# Patient Record
Sex: Male | Born: 1955
Health system: Southern US, Community
[De-identification: ages and names within clinical notes are randomized; demographics above are authoritative.]

## PROBLEM LIST (undated history)

## (undated) DIAGNOSIS — K269 Duodenal ulcer, unspecified as acute or chronic, without hemorrhage or perforation: Secondary | ICD-10-CM

## (undated) DIAGNOSIS — M1990 Unspecified osteoarthritis, unspecified site: Secondary | ICD-10-CM

## (undated) DIAGNOSIS — I10 Essential (primary) hypertension: Secondary | ICD-10-CM

## (undated) DIAGNOSIS — C189 Malignant neoplasm of colon, unspecified: Secondary | ICD-10-CM

## (undated) DIAGNOSIS — J449 Chronic obstructive pulmonary disease, unspecified: Secondary | ICD-10-CM

## (undated) HISTORY — DX: Duodenal ulcer, unspecified as acute or chronic, without hemorrhage or perforation: K26.9

## (undated) HISTORY — PX: HERNIA REPAIR: SHX51

## (undated) HISTORY — DX: Malignant neoplasm of colon, unspecified: C18.9

---

## 1998-04-19 ENCOUNTER — Ambulatory Visit (HOSPITAL_BASED_OUTPATIENT_CLINIC_OR_DEPARTMENT_OTHER): Admission: RE | Admit: 1998-04-19 | Discharge: 1998-04-19 | Payer: Self-pay | Admitting: General Surgery

## 2004-02-18 ENCOUNTER — Emergency Department (HOSPITAL_COMMUNITY): Admission: EM | Admit: 2004-02-18 | Discharge: 2004-02-18 | Payer: Self-pay | Admitting: *Deleted

## 2004-02-18 IMAGING — CR DG ANKLE COMPLETE 3+V*L*
2 series · 2 of 2 positions shown · non-contrast
Comparison: none

CLINICAL DATA: 48-year-old male; twisting ankle injury; lateral pain
LEFT ANKLE THREE VIEWS:
There is diffuse ankle soft tissue swelling and a suspected joint effusion on the lateral view.  On the frontal and oblique views, there is a vertical lucency through the left distal tibia extending into the articular surface most consistent with a nondisplaced fracture, likely involving the interarticular joint surface.  Alignment is anatomic.  Peripheral atherosclerosis is noted.  There is an irregular nondisplaced lucency through the left distal fibula shaft above the malleolus concerning for a second nondisplaced fracture.

[view not recorded (1 of 2)]
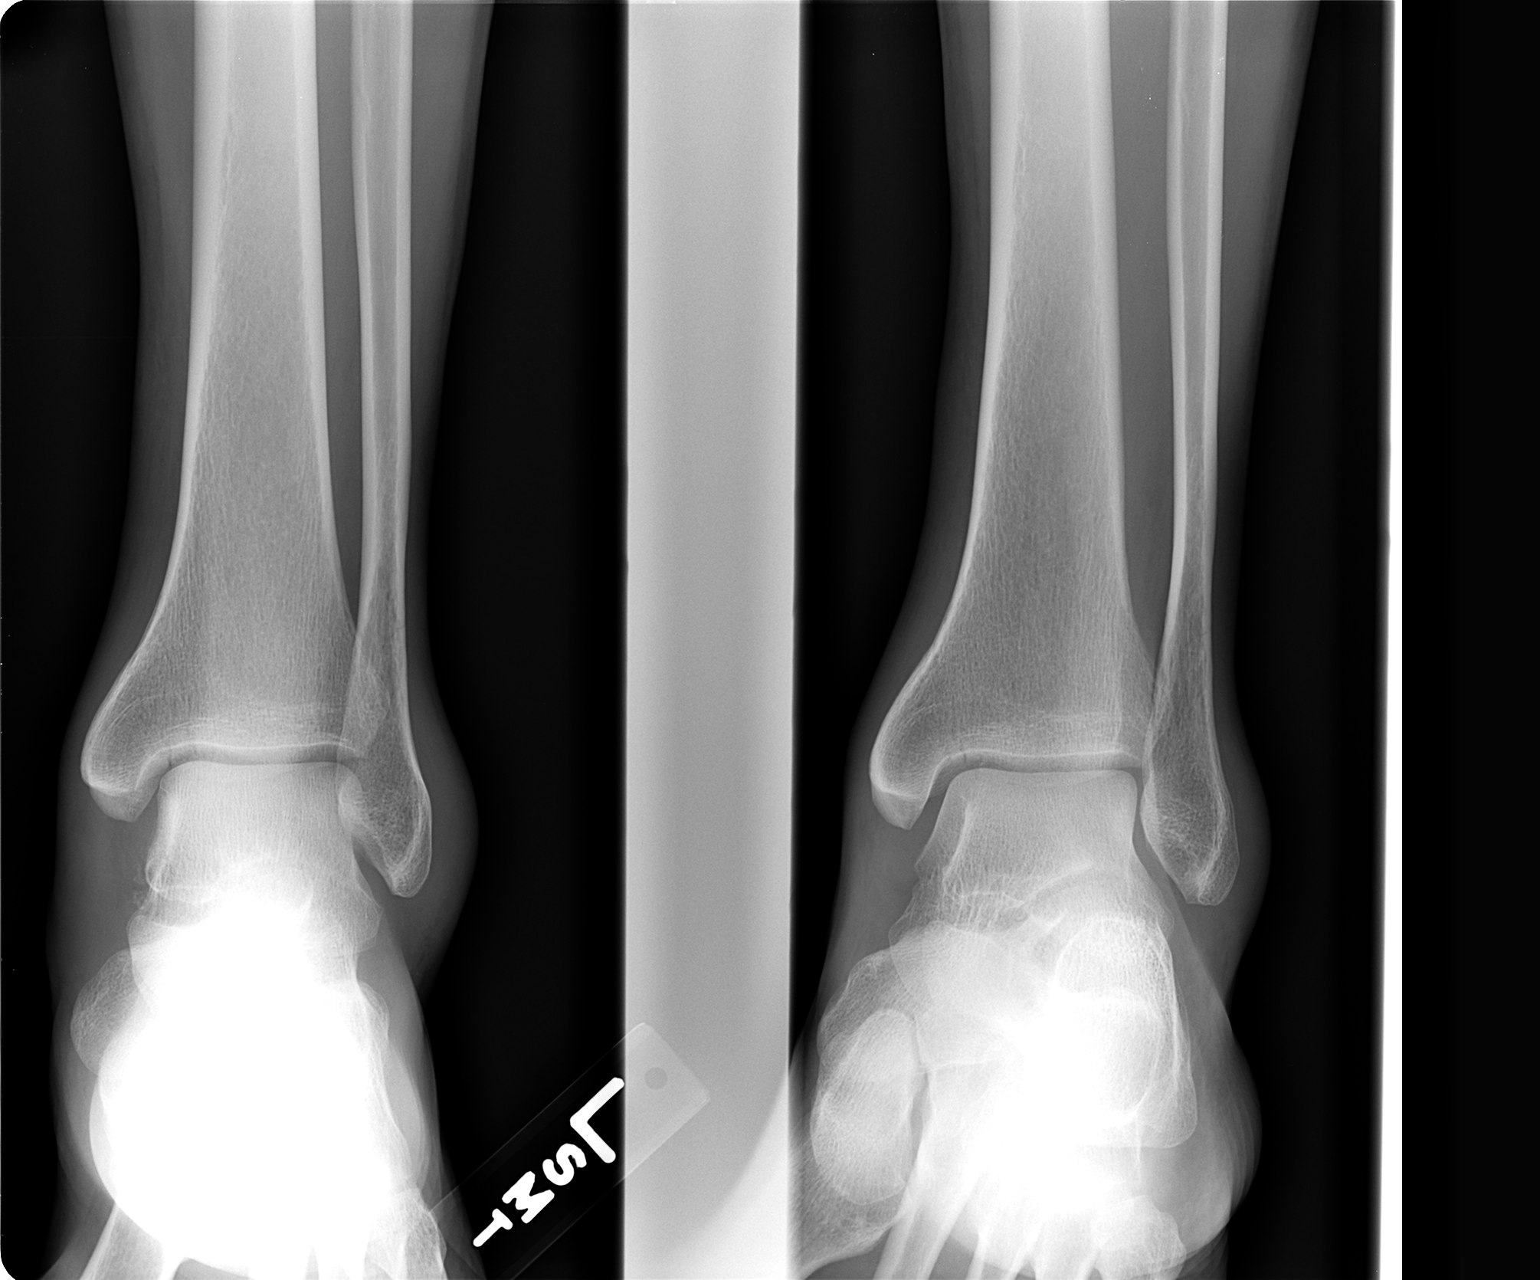

[view not recorded (2 of 2)]
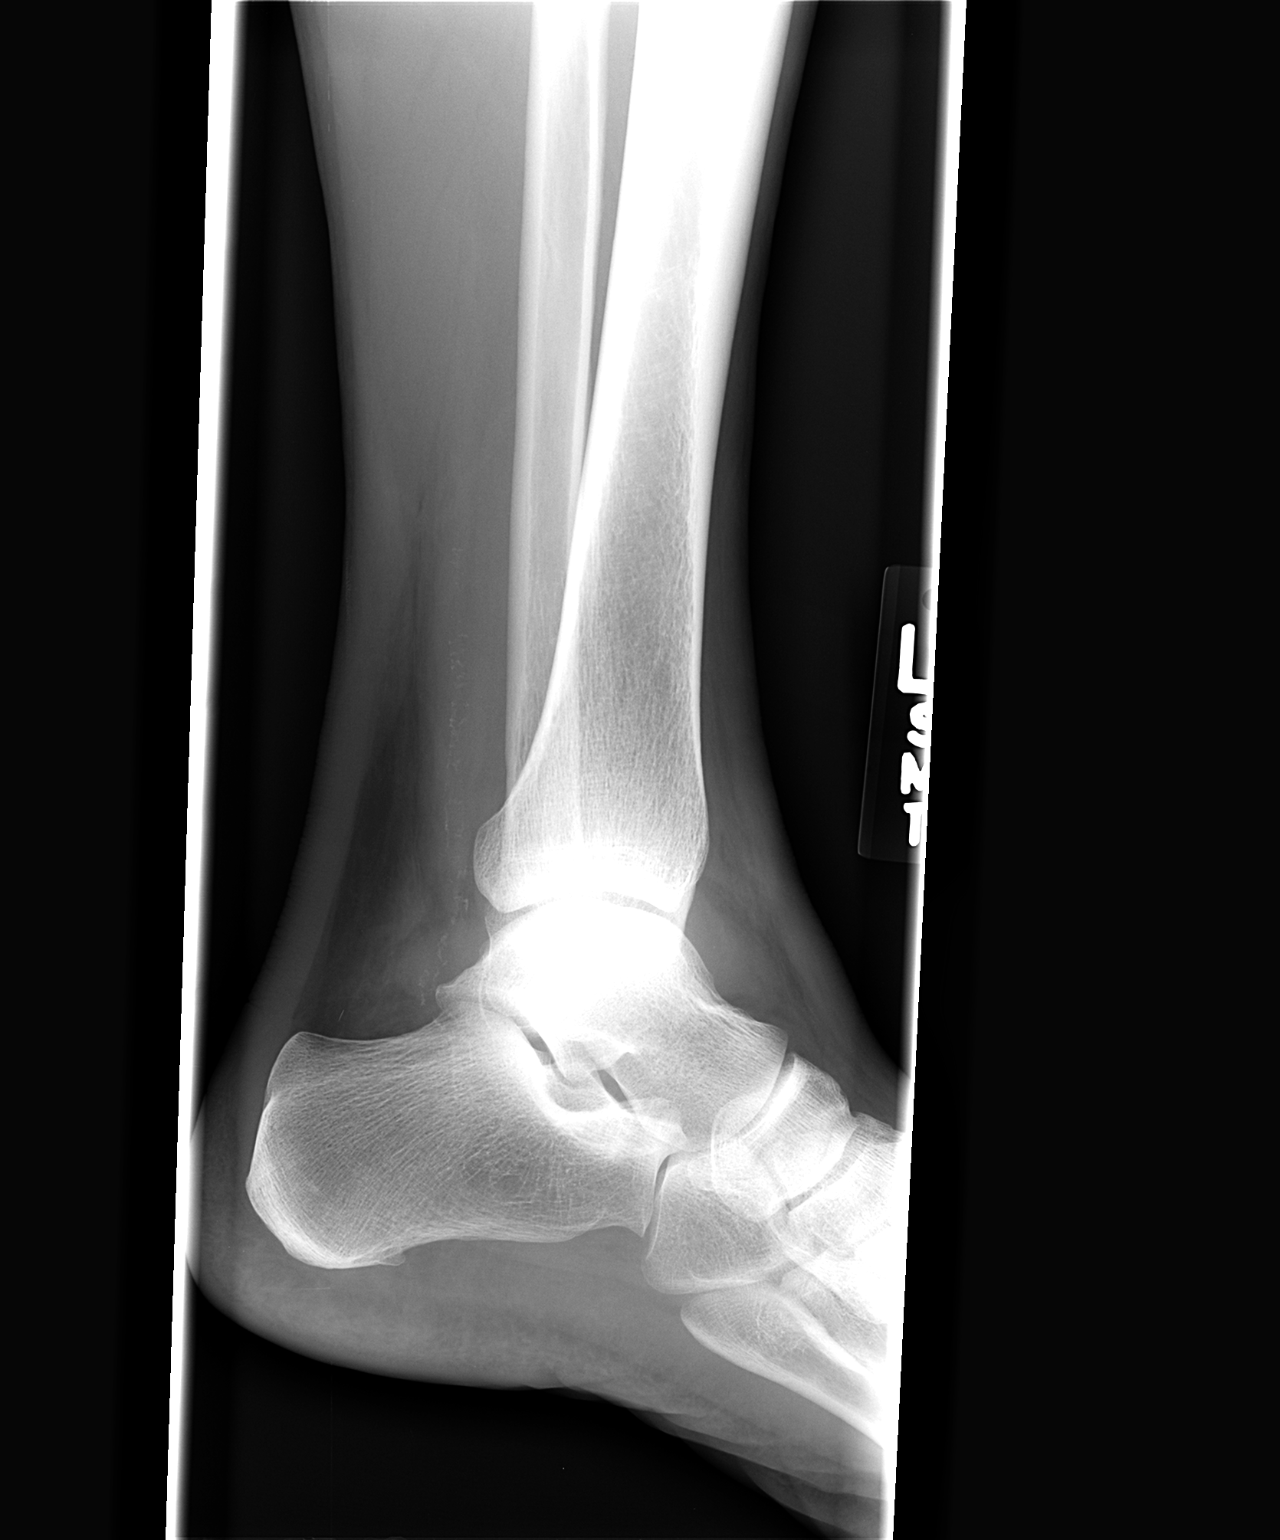

[2 of 2 positions shown; findings below may reference images not displayed]

IMPRESSION: 1.  Diffuse ankle soft tissue swelling with a suspected nondisplaced interarticular fracture of the left distal tibia.
2.  Also suspect nondisplaced fracture of the left distal fibula shaft above the lateral malleolus.
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
## 2006-10-09 ENCOUNTER — Ambulatory Visit (HOSPITAL_COMMUNITY): Admission: RE | Admit: 2006-10-09 | Discharge: 2006-10-09 | Payer: Self-pay | Admitting: Internal Medicine

## 2010-12-13 ENCOUNTER — Emergency Department (HOSPITAL_COMMUNITY)
Admission: EM | Admit: 2010-12-13 | Discharge: 2010-12-14 | Disposition: A | Discharge status: Home / Self Care | Payer: Self-pay | Attending: Emergency Medicine | Admitting: Emergency Medicine

## 2010-12-13 ENCOUNTER — Emergency Department (HOSPITAL_COMMUNITY): Payer: Self-pay

## 2010-12-13 DIAGNOSIS — S02401A Maxillary fracture, unspecified, initial encounter for closed fracture: Secondary | ICD-10-CM | POA: Insufficient documentation

## 2010-12-13 DIAGNOSIS — R51 Headache: Secondary | ICD-10-CM | POA: Insufficient documentation

## 2010-12-13 DIAGNOSIS — H5789 Other specified disorders of eye and adnexa: Secondary | ICD-10-CM | POA: Insufficient documentation

## 2010-12-13 DIAGNOSIS — S02400A Malar fracture unspecified, initial encounter for closed fracture: Secondary | ICD-10-CM | POA: Insufficient documentation

## 2010-12-13 DIAGNOSIS — S0280XA Fracture of other specified skull and facial bones, unspecified side, initial encounter for closed fracture: Secondary | ICD-10-CM | POA: Insufficient documentation

## 2010-12-13 DIAGNOSIS — IMO0002 Reserved for concepts with insufficient information to code with codable children: Secondary | ICD-10-CM | POA: Insufficient documentation

## 2010-12-13 DIAGNOSIS — S0003XA Contusion of scalp, initial encounter: Secondary | ICD-10-CM | POA: Insufficient documentation

## 2010-12-13 DIAGNOSIS — S01409A Unspecified open wound of unspecified cheek and temporomandibular area, initial encounter: Secondary | ICD-10-CM | POA: Insufficient documentation
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
## 2016-03-22 ENCOUNTER — Encounter (HOSPITAL_COMMUNITY): Payer: Self-pay

## 2016-03-22 ENCOUNTER — Emergency Department (HOSPITAL_COMMUNITY): Payer: Self-pay

## 2016-03-22 ENCOUNTER — Emergency Department (HOSPITAL_COMMUNITY)
Admission: EM | Admit: 2016-03-22 | Discharge: 2016-03-22 | Disposition: A | Discharge status: Home / Self Care | Payer: Self-pay | Attending: Emergency Medicine | Admitting: Emergency Medicine

## 2016-03-22 DIAGNOSIS — W1830XA Fall on same level, unspecified, initial encounter: Secondary | ICD-10-CM | POA: Insufficient documentation

## 2016-03-22 DIAGNOSIS — Y999 Unspecified external cause status: Secondary | ICD-10-CM | POA: Insufficient documentation

## 2016-03-22 DIAGNOSIS — S82891A Other fracture of right lower leg, initial encounter for closed fracture: Secondary | ICD-10-CM

## 2016-03-22 DIAGNOSIS — Y929 Unspecified place or not applicable: Secondary | ICD-10-CM | POA: Insufficient documentation

## 2016-03-22 DIAGNOSIS — S8261XA Displaced fracture of lateral malleolus of right fibula, initial encounter for closed fracture: Secondary | ICD-10-CM | POA: Insufficient documentation

## 2016-03-22 DIAGNOSIS — F17210 Nicotine dependence, cigarettes, uncomplicated: Secondary | ICD-10-CM | POA: Insufficient documentation

## 2016-03-22 DIAGNOSIS — Y939 Activity, unspecified: Secondary | ICD-10-CM | POA: Insufficient documentation

## 2016-03-22 IMAGING — CR DG CHEST 2V
2 series · 2 of 2 positions shown · non-contrast
Comparison: [DATE]

CLINICAL DATA: Cough for years.  Smoker.

EXAM:
CHEST  2 VIEW

[chest pa]
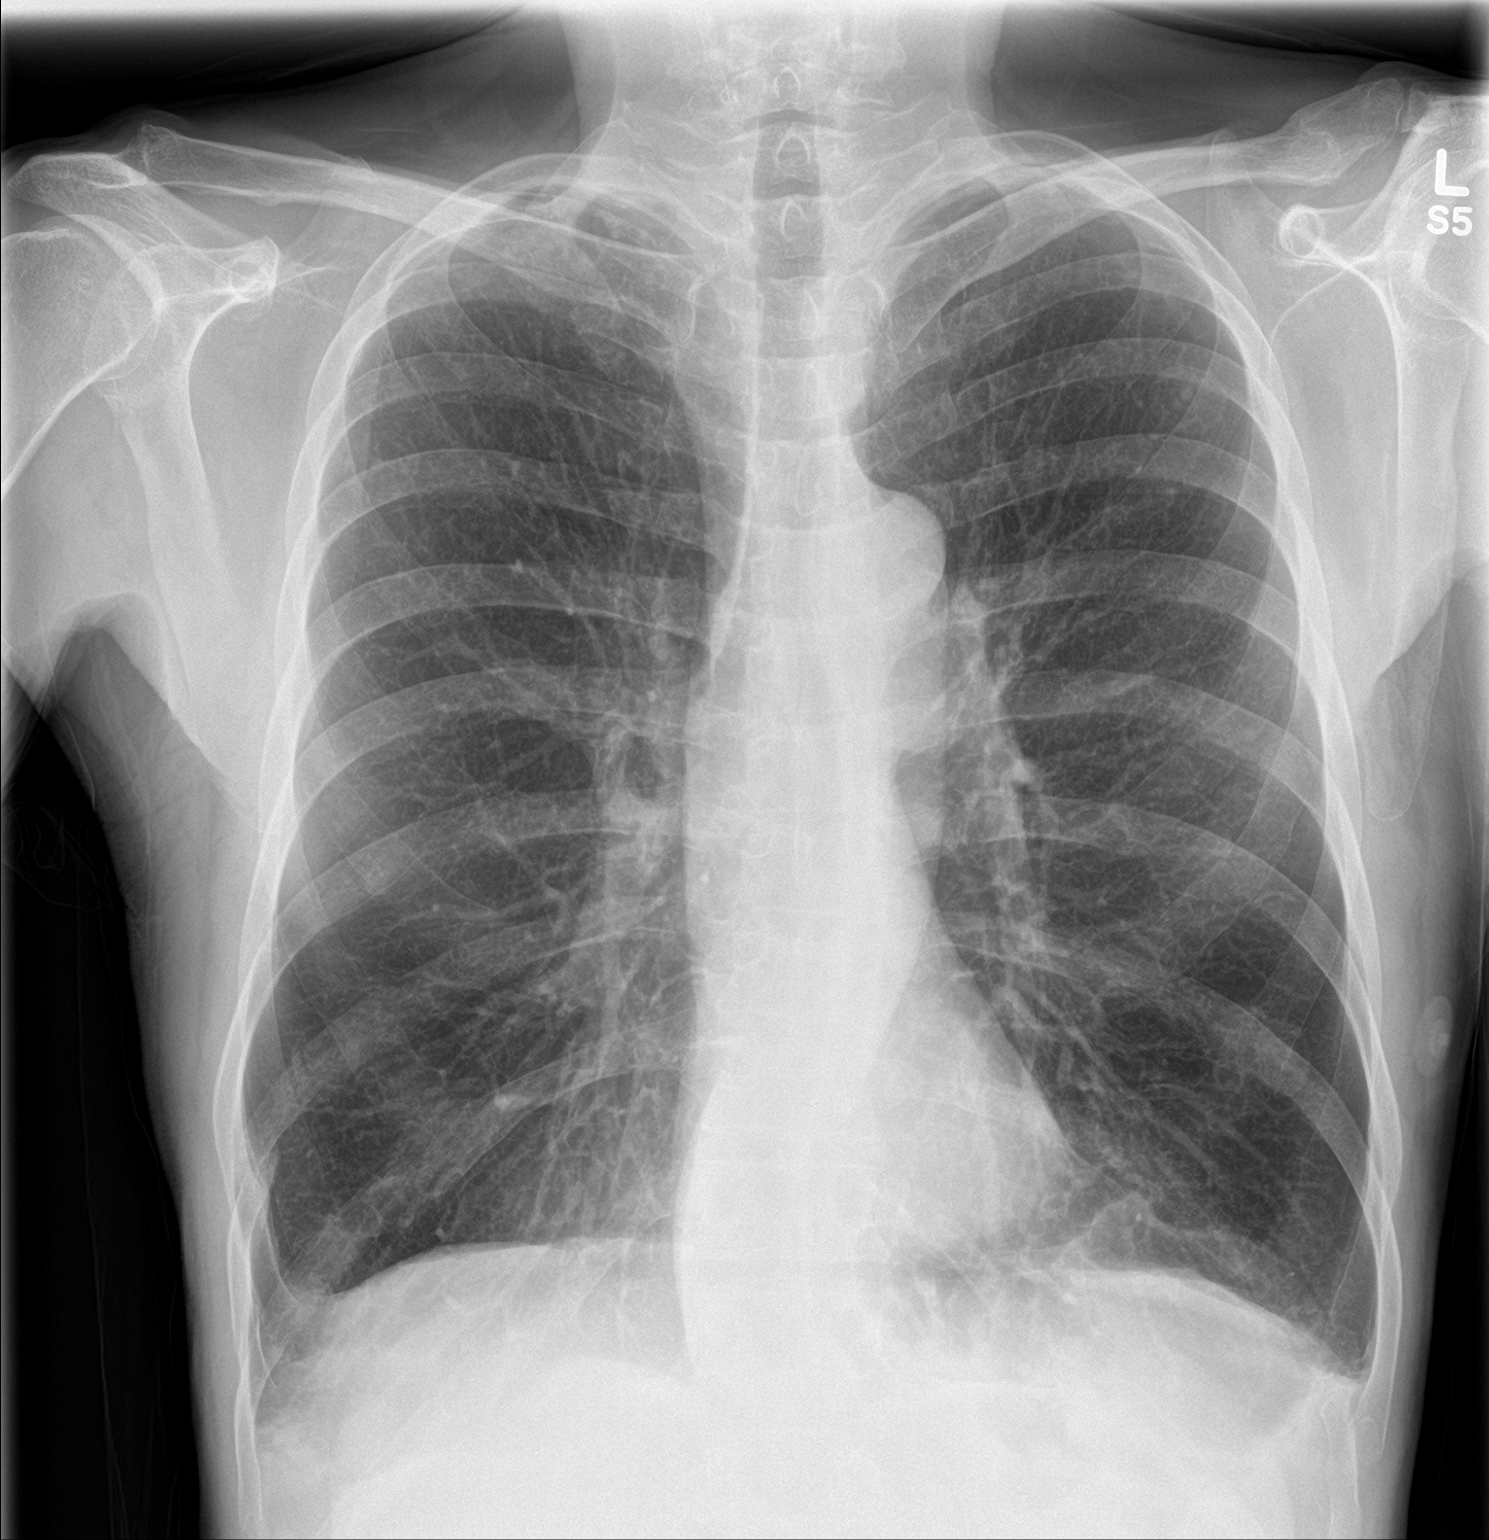

[chest lat]
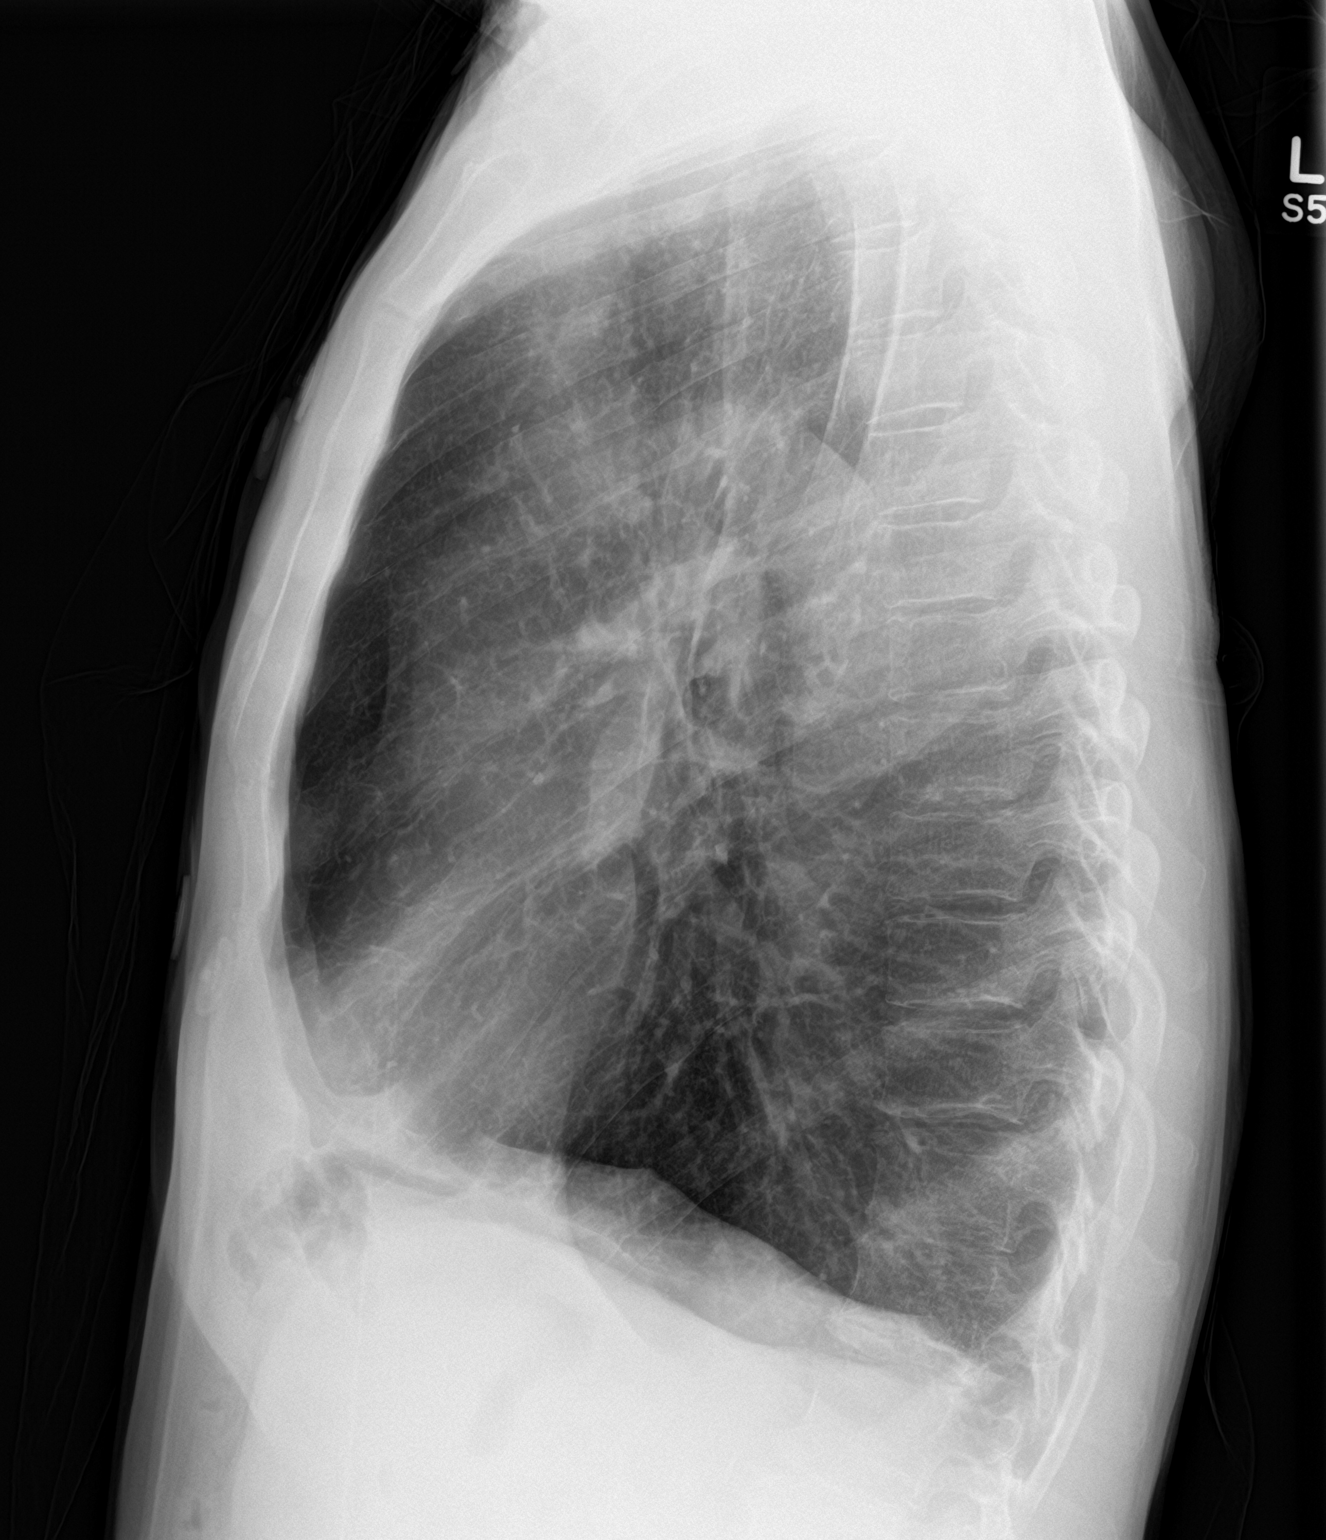

[2 of 2 positions shown; findings below may reference images not displayed]

FINDINGS: There is hyperinflation of the lungs compatible with COPD. Biapical
scarring. Heart and mediastinal contours are within normal limits.
No confluent opacities or effusions. No acute bony abnormality.
IMPRESSION: COPD.  No active disease.

## 2016-03-22 IMAGING — CR DG ANKLE COMPLETE 3+V*R*
3 series · 3 of 3 positions shown · non-contrast
Comparison: None.

CLINICAL DATA: Fall from porch on [DATE] with twisting ankle
injury. Lateral pain and swelling.

EXAM:
RIGHT ANKLE - COMPLETE 3+ VIEW

[ankle ap]
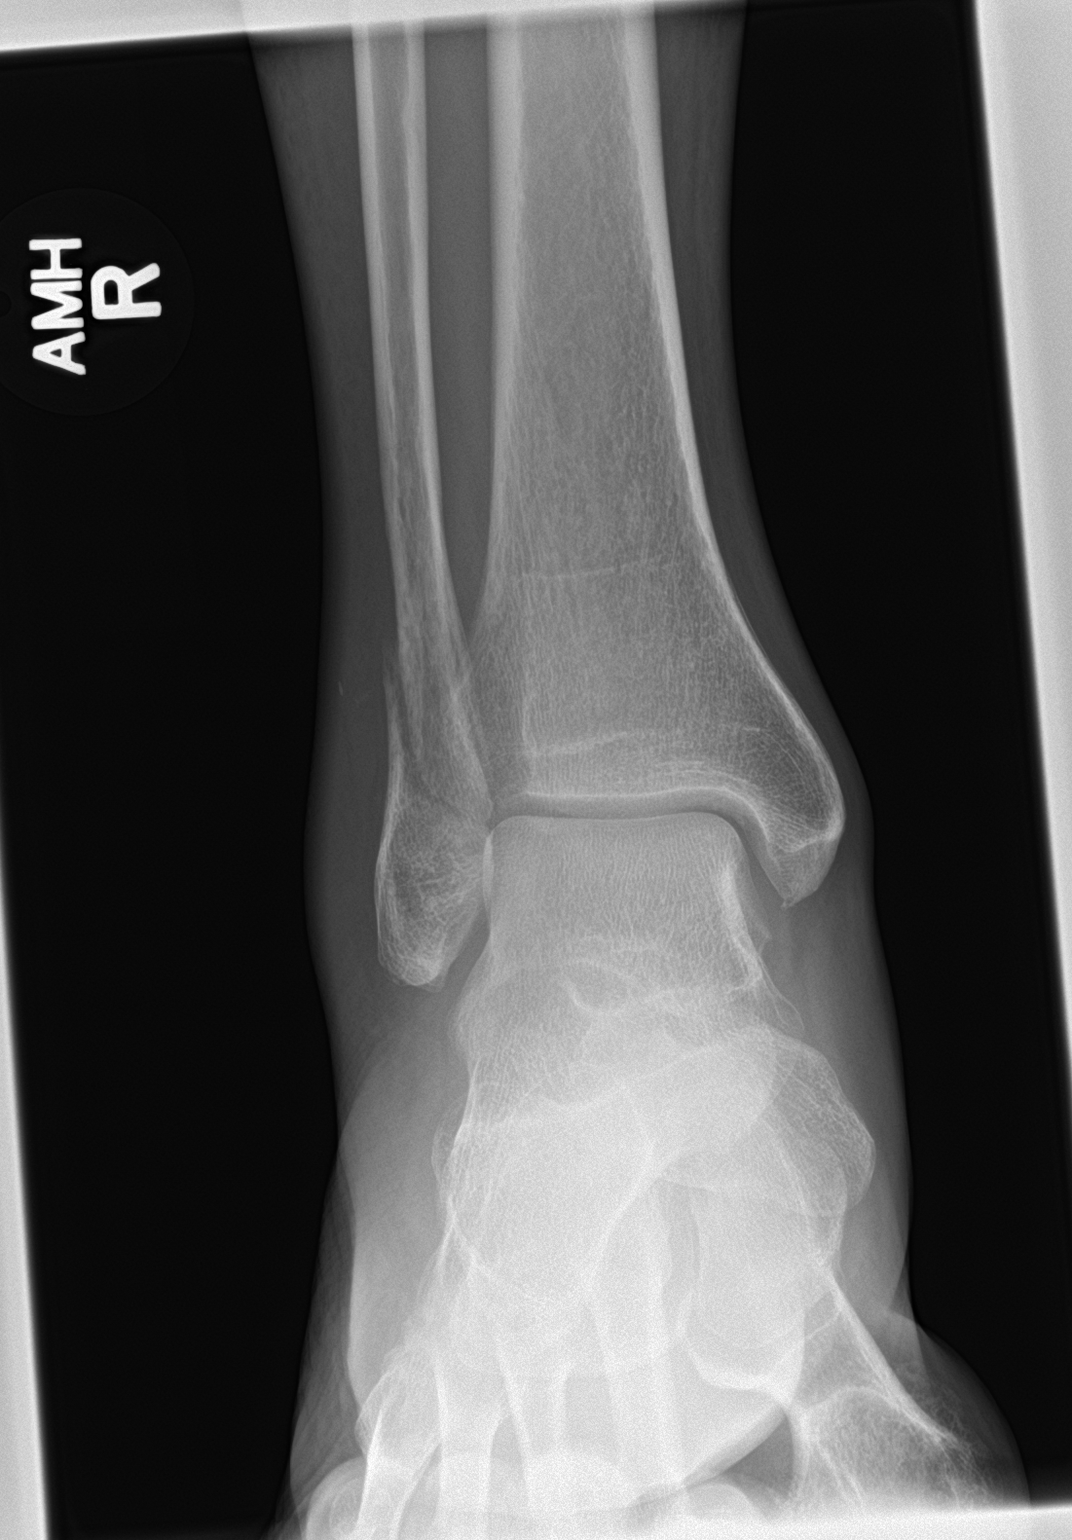

[ankle obl]
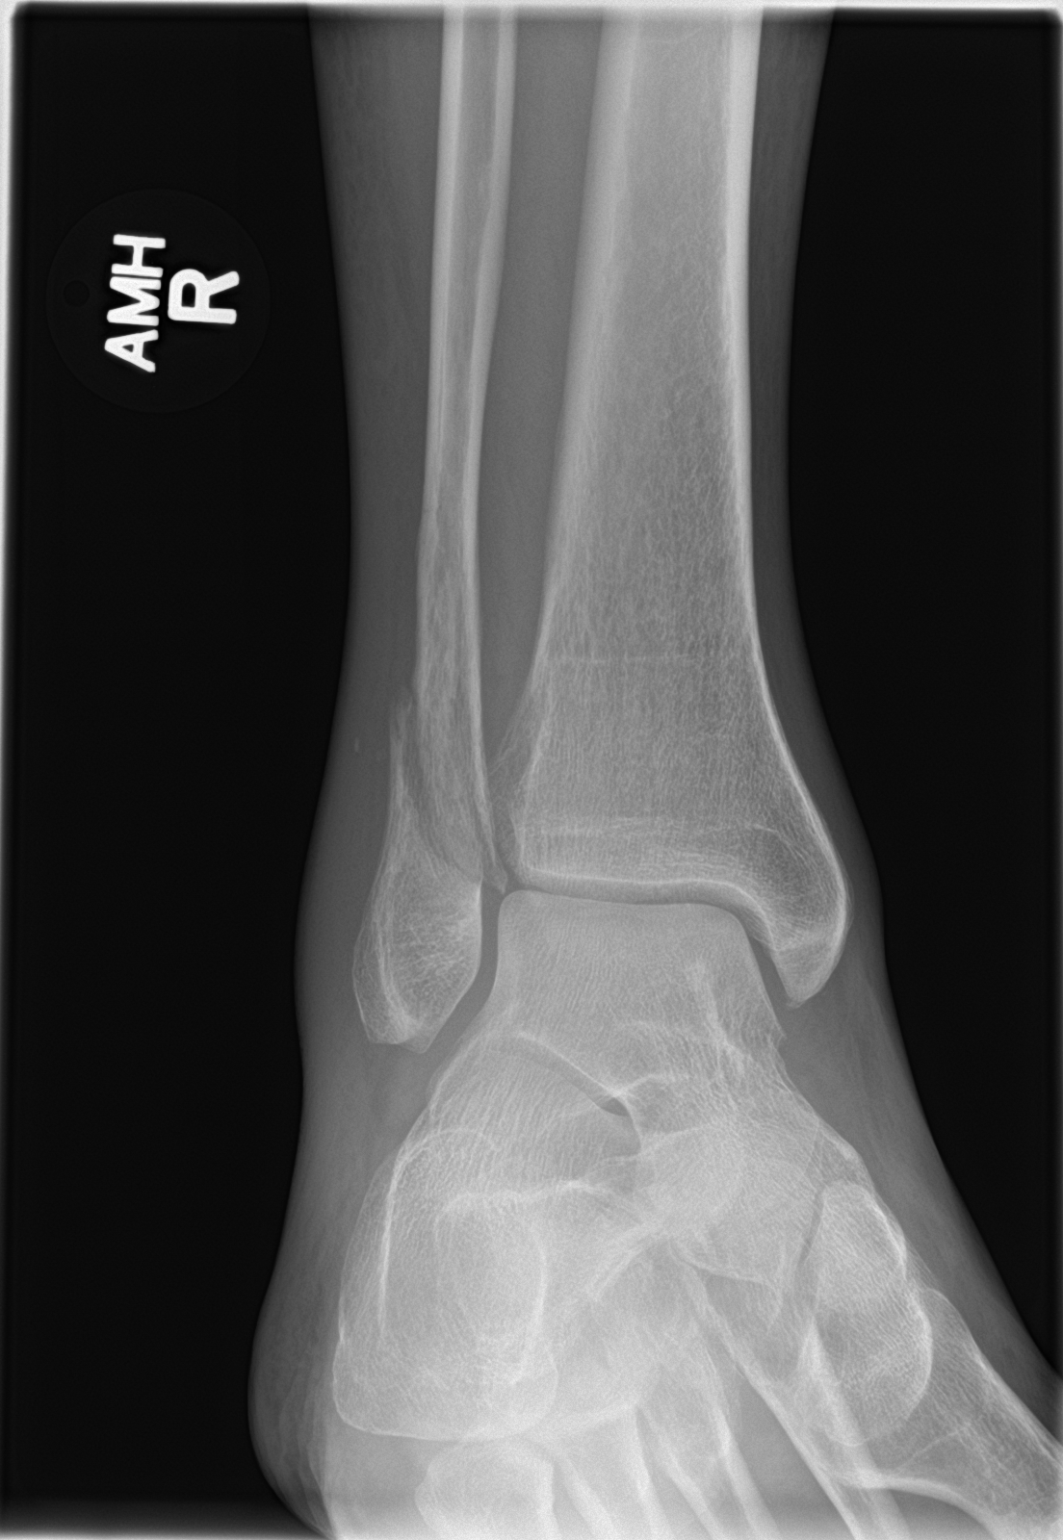

[ankle lat]
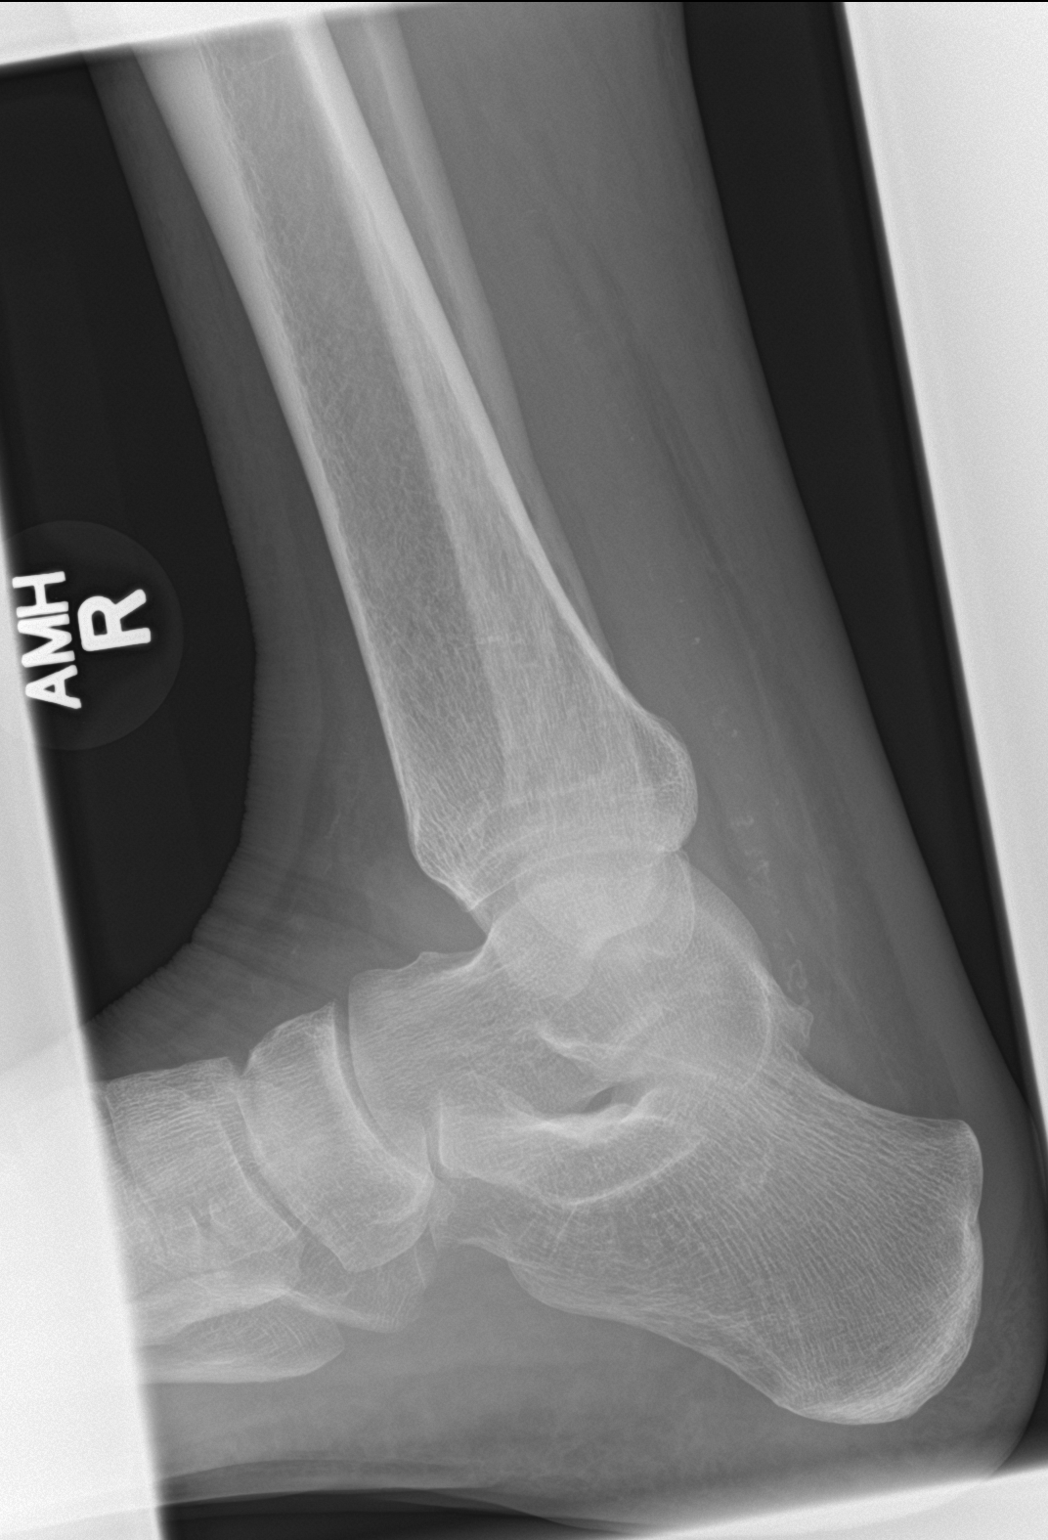

[3 of 3 positions shown; findings below may reference images not displayed]

FINDINGS: Oblique fracture of the lateral malleolus with overlying soft tissue
swelling. There is some spurring of the inferior tip of the medial
malleolus but I do not observe a definite avulsion along the deltoid
ligament, nor is there a medial malleolar fracture appreciated. No
posterior malleolar fracture seen.

Tibiotalar joint effusion is present. Atherosclerotic vascular
calcifications noted.
IMPRESSION: 1. Oblique lateral malleolar fracture without visible widening of
the plafond and, medial malleolar fracture, or posterior malleolar
fracture. Appearance most compatible with Weber B stage 2 fracture
pattern. Overlying lateral soft tissue swelling noted along with a
tibiotalar joint effusion.

## 2016-03-22 IMAGING — CR DG FOOT COMPLETE 3+V*R*
3 series · 3 of 3 positions shown · non-contrast
Comparison: None.

CLINICAL DATA: Pain and swelling of the lateral right foot, status
post falling 1 week ago.

EXAM:
RIGHT FOOT COMPLETE - 3+ VIEW

[foot ap]
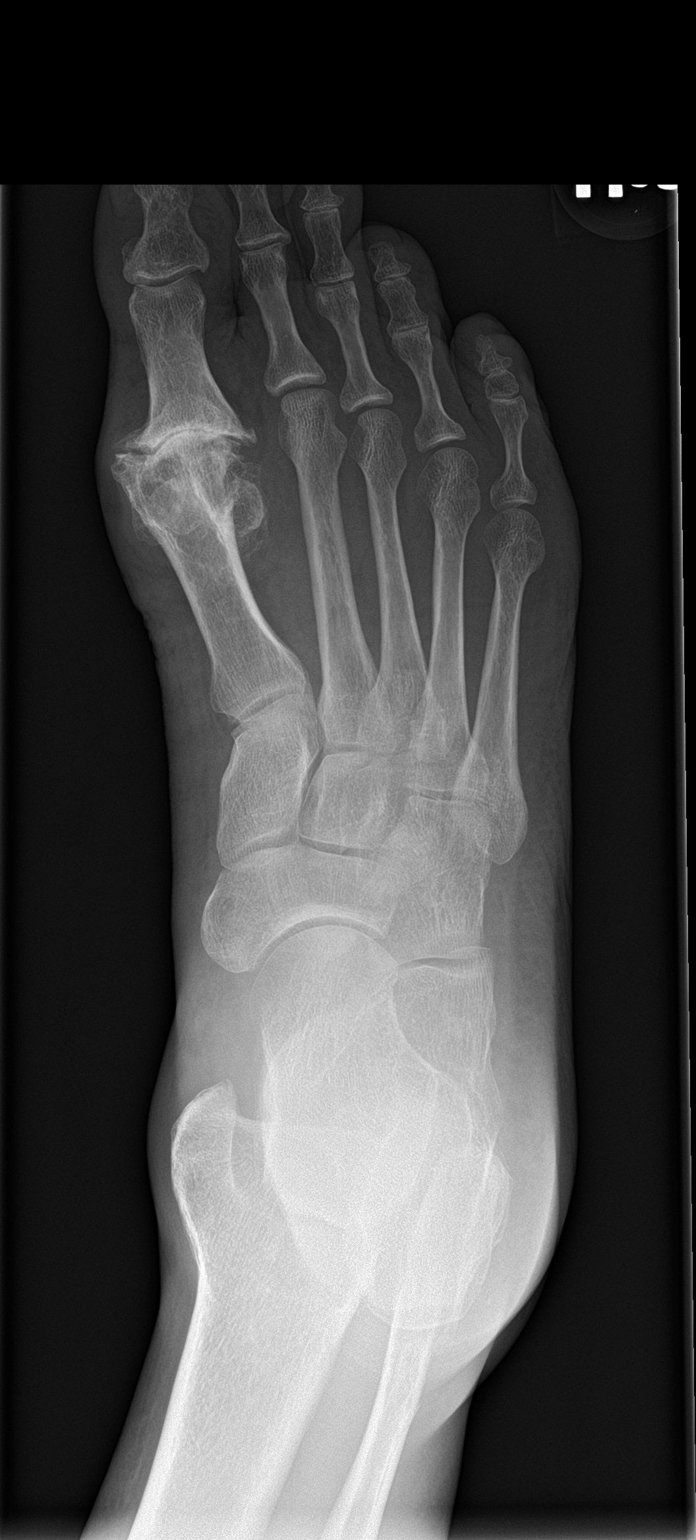

[foot obl]
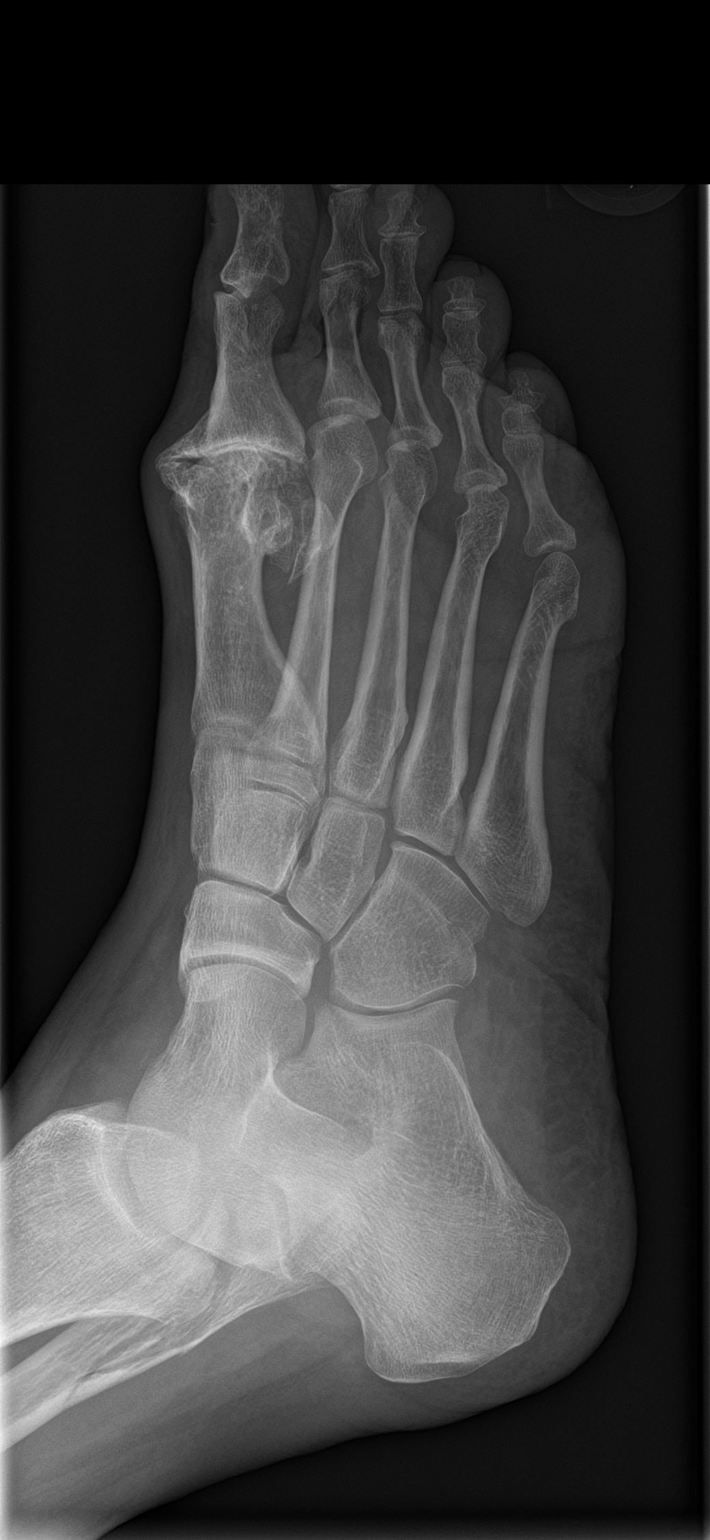

[foot lat]
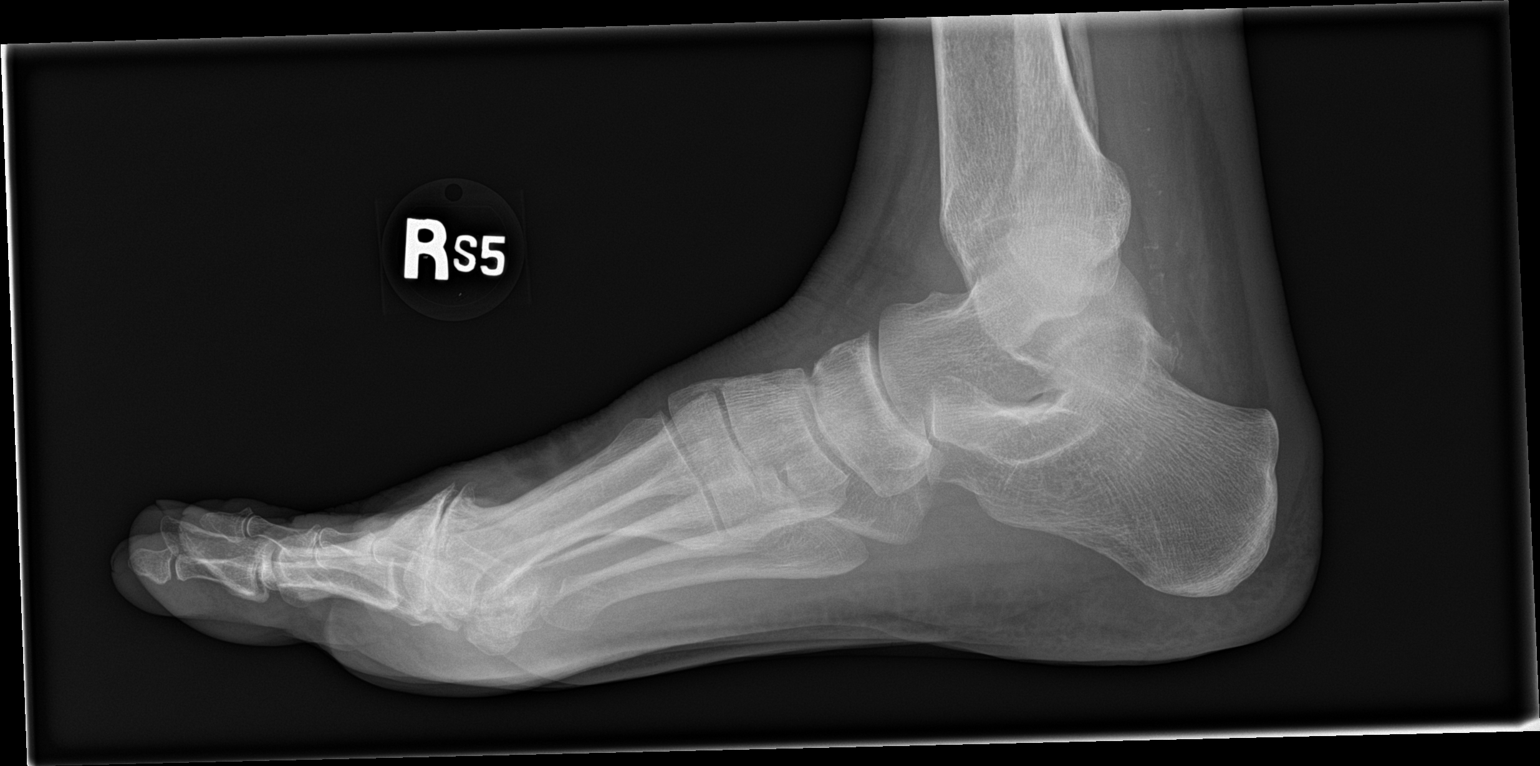

[3 of 3 positions shown; findings below may reference images not displayed]

FINDINGS: There is no evidence of fracture or dislocation. There is severe
osteoarthritic changes at the first metatarsophalangeal joint with
effacement of the joint space, remodeling and osteophyte formation.
Mild soft tissue swelling about the fifth metatarsophalangeal joint.
IMPRESSION: No acute fracture or dislocation identified about the right foot.

Mild soft tissue swelling about the fifth metatarsophalangeal joint.

Severe osteoarthritic changes at the first metatarsophalangeal
joint.

## 2016-03-22 MED ORDER — MORPHINE SULFATE 15 MG PO TABS: 15.0000 mg | ORAL_TABLET | ORAL | 0 refills | Status: DC | PRN

## 2016-03-22 MED ORDER — ALBUTEROL SULFATE HFA 108 (90 BASE) MCG/ACT IN AERS
6.0000 | INHALATION_SPRAY | Freq: Once | RESPIRATORY_TRACT | Status: AC
  Administered 2016-03-22: 6 via RESPIRATORY_TRACT
  Filled 2016-03-22: qty 6.7

## 2016-03-22 MED ORDER — DEXAMETHASONE 4 MG PO TABS
10.0000 mg | ORAL_TABLET | Freq: Once | ORAL | Status: AC
  Administered 2016-03-22: 10 mg via ORAL
  Filled 2016-03-22: qty 3

## 2016-03-22 NOTE — ED Provider Notes (Signed)
[facility] DEPT Provider Note   CSN: [id number] Arrival date & time: 03/22/16  0806     History   Chief Complaint Chief Complaint  Patient presents with  . cough, shortness olf breath    HPI Marc Mcdonald is a 61 y.o. male.  61 yo M with cough.  Going on for about a week.  Mild increase in baseline cough, change in sputum and mild increase in sputum.  Coughed so hard that he stepped into a hole about 4 days ago.  Pain to the R lateral ankle.  Having trouble walking on it.     The history is provided by the patient.  Illness  This is a new problem. The current episode started yesterday. The problem occurs constantly. The problem has not changed since onset.Associated symptoms include shortness of breath. Pertinent negatives include no chest pain, no abdominal pain and no headaches. Nothing aggravates the symptoms. Nothing relieves the symptoms. He has tried nothing for the symptoms. The treatment provided no relief.    History reviewed. No pertinent past medical history.  There are no active problems to display for this patient.   History reviewed. No pertinent surgical history.     Home Medications    Prior to Admission medications   Medication Sig Start Date End Date Taking? Authorizing Provider  morphine (MSIR) 15 MG tablet Take 1 tablet (15 mg total) by mouth every 4 (four) hours as needed for severe pain. 03/22/16   Deno Etienne, DO    Family History No family history on file.  Social History Social History  Substance Use Topics  . Smoking status: Current Every Day Smoker    Types: Cigarettes  . Smokeless tobacco: Never Used  . Alcohol use Not on file     Allergies   Patient has no known allergies.   Review of Systems Review of Systems  Constitutional: Negative for chills and fever.  HENT: Negative for congestion and facial swelling.   Eyes: Negative for discharge and visual disturbance.  Respiratory: Positive for cough and shortness of breath.     Cardiovascular: Negative for chest pain and palpitations.  Gastrointestinal: Negative for abdominal pain, diarrhea and vomiting.  Musculoskeletal: Positive for arthralgias, gait problem and myalgias.  Skin: Negative for color change and rash.  Neurological: Negative for tremors, syncope and headaches.  Psychiatric/Behavioral: Negative for confusion and dysphoric mood.     Physical Exam Updated Vital Signs BP 130/91   Pulse 89   Temp 97.7 F (36.5 C) (Oral)   Resp 21   Ht 5\' 11"  (1.803 m)   Wt 135 lb (61.2 kg)   SpO2 92%   BMI 18.83 kg/m   Physical Exam  Constitutional: He is oriented to person, place, and time. He appears well-developed and well-nourished.  HENT:  Head: Normocephalic and atraumatic.  Eyes: EOM are normal. Pupils are equal, round, and reactive to light.  Neck: Normal range of motion. Neck supple. No JVD present.  Cardiovascular: Normal rate and regular rhythm.  Exam reveals no gallop and no friction rub.   No murmur heard. Pulmonary/Chest: No respiratory distress. He has no wheezes.  Diminished diffusely, prolonged expiration  Abdominal: He exhibits no distension. There is no rebound and no guarding.  Musculoskeletal: Normal range of motion. He exhibits edema and tenderness.  TTP about the right lateral malleolus.  PMS intact distally.  No fibular neck tenderness.   Neurological: He is alert and oriented to person, place, and time.  Skin: No rash noted. No  pallor.  Psychiatric: He has a normal mood and affect. His behavior is normal.  Nursing note and vitals reviewed.    ED Treatments / Results  Labs (all labs ordered are listed, but only abnormal results are displayed) Labs Reviewed - No data to display  EKG  EKG Interpretation None       Radiology Dg Chest 2 View  Result Date: 03/22/2016 CLINICAL DATA:  Cough for years.  Smoker. EXAM: CHEST  2 VIEW COMPARISON:  10/09/2006 FINDINGS: There is hyperinflation of the lungs compatible with COPD.  Biapical scarring. Heart and mediastinal contours are within normal limits. No confluent opacities or effusions. No acute bony abnormality. IMPRESSION: COPD.  No active disease. Electronically Signed   By: Rolm Baptise M.D.   On: 03/22/2016 09:11   Dg Ankle Complete Right  Result Date: 03/22/2016 CLINICAL DATA:  Fall from [place] on 03/14/2016 with twisting ankle injury. Lateral pain and swelling. EXAM: RIGHT ANKLE - COMPLETE 3+ VIEW COMPARISON:  None. FINDINGS: Oblique fracture of the lateral malleolus with overlying soft tissue swelling. There is some spurring of the inferior tip of the medial malleolus but I do not observe a definite avulsion along the deltoid ligament, nor is there a medial malleolar fracture appreciated. No posterior malleolar fracture seen. Tibiotalar joint effusion is present. Atherosclerotic vascular calcifications noted. IMPRESSION: 1. Oblique lateral malleolar fracture without visible widening of the plafond and, medial malleolar fracture, or posterior malleolar fracture. Appearance most compatible with Weber B stage 2 fracture pattern. Overlying lateral soft tissue swelling noted along with a tibiotalar joint effusion. Electronically Signed   By: Van Clines M.D.   On: 03/22/2016 10:11   Dg Foot Complete Right  Result Date: 03/22/2016 CLINICAL DATA:  Pain and swelling of the lateral right foot, status post falling 1 week ago. EXAM: RIGHT FOOT COMPLETE - 3+ VIEW COMPARISON:  None. FINDINGS: There is no evidence of fracture or dislocation. There is severe osteoarthritic changes at the first metatarsophalangeal joint with effacement of the joint space, remodeling and osteophyte formation. Mild soft tissue swelling about the fifth metatarsophalangeal joint. IMPRESSION: No acute fracture or dislocation identified about the right foot. Mild soft tissue swelling about the fifth metatarsophalangeal joint. Severe osteoarthritic changes at the first metatarsophalangeal joint.  Electronically Signed   By: Fidela Salisbury M.D.   On: 03/22/2016 09:13    Procedures Procedures (including critical care time)  Medications Ordered in ED Medications  albuterol (PROVENTIL HFA;VENTOLIN HFA) 108 (90 Base) MCG/ACT inhaler 6 puff (6 puffs Inhalation Given 03/22/16 1009)  dexamethasone (DECADRON) tablet 10 mg (10 mg Oral Given 03/22/16 1009)     Initial Impression / Assessment and Plan / ED Course  I have reviewed the triage vital signs and the nursing notes.  Pertinent labs & imaging results that were available during my care of the patient were reviewed by me and considered in my medical decision making (see chart for details).     61 yo M with cough and ankle pain.  CXR without acute infection, patients symptoms are likely COPD exacerbation. Doesn't normally see a doc.  R ankle pain and swelling to the malleolus, will xray to eval for fx.  X-ray with Weber B fracture is viewed by me. We'll place the patient in a splint. Nonweightbearing at all times. Ortho follow-up. Small amount of narcotics.  10:39 AM:  I have discussed the diagnosis/risks/treatment options with the patient and family and believe the pt to be eligible for discharge home to follow-up with  Ortho. We also discussed returning to the ED immediately if new or worsening sx occur. We discussed the sx which are most concerning (e.g., sudden worsening pain,numbness, tinging) that necessitate immediate return. Medications administered to the patient during their visit and any new prescriptions provided to the patient are listed below.  Medications given during this visit Medications  albuterol (PROVENTIL HFA;VENTOLIN HFA) 108 (90 Base) MCG/ACT inhaler 6 puff (6 puffs Inhalation Given 03/22/16 1009)  dexamethasone (DECADRON) tablet 10 mg (10 mg Oral Given 03/22/16 1009)     The patient appears reasonably screen and/or stabilized for discharge and I doubt any other medical condition or other [facility] requiring further  screening, evaluation, or treatment in the ED at this time prior to discharge.    Discussed smoking cessation with patient and was they were offerred resources to help stop.  Total time was 5 min CPT code 99406.    Final Clinical Impressions(s) / ED Diagnoses   Final diagnoses:  Closed fracture of right ankle, initial encounter    New Prescriptions New Prescriptions   MORPHINE (MSIR) 15 MG TABLET    Take 1 tablet (15 mg total) by mouth every 4 (four) hours as needed for severe pain.     Deno Etienne, DO 03/22/16 1039

## 2016-03-22 NOTE — Progress Notes (Signed)
Orthopedic Tech Progress Note Patient Details:  Marc Mcdonald 1955-10-28 BP:[id number]  Ortho Devices Type of Ortho Device: Ace wrap, Post (short) splint, Stirrup splint Splint Material: Fiberglass Ortho Device/Splint Interventions: Application   [place] Pink 03/22/2016, 11:20 AM

## 2016-03-22 NOTE — Discharge Instructions (Signed)

## 2016-03-22 NOTE — ED Triage Notes (Signed)
Patient here with cough and shortness of breath x 1 week. States that he has smoked for years and never been diagnosed with lung disease, cough worse in am. Also has swelling and pain to right foot after falling 1 week ago

## 2019-09-08 ENCOUNTER — Other Ambulatory Visit: Payer: Self-pay

## 2019-09-08 ENCOUNTER — Encounter (HOSPITAL_COMMUNITY): Payer: Self-pay

## 2019-09-08 ENCOUNTER — Emergency Department (HOSPITAL_COMMUNITY): Payer: Medicare Other

## 2019-09-08 ENCOUNTER — Observation Stay (HOSPITAL_COMMUNITY)
Admission: EM | Admit: 2019-09-08 | Discharge: 2019-09-09 | Disposition: A | Discharge status: Home / Self Care | Payer: Medicare Other | Attending: General Surgery | Admitting: General Surgery

## 2019-09-08 DIAGNOSIS — S270XXA Traumatic pneumothorax, initial encounter: Secondary | ICD-10-CM | POA: Insufficient documentation

## 2019-09-08 DIAGNOSIS — S2232XA Fracture of one rib, left side, initial encounter for closed fracture: Secondary | ICD-10-CM | POA: Diagnosis not present

## 2019-09-08 DIAGNOSIS — Y939 Activity, unspecified: Secondary | ICD-10-CM | POA: Insufficient documentation

## 2019-09-08 DIAGNOSIS — Y999 Unspecified external cause status: Secondary | ICD-10-CM | POA: Diagnosis not present

## 2019-09-08 DIAGNOSIS — R0789 Other chest pain: Secondary | ICD-10-CM | POA: Insufficient documentation

## 2019-09-08 DIAGNOSIS — Z20822 Contact with and (suspected) exposure to covid-19: Secondary | ICD-10-CM | POA: Diagnosis not present

## 2019-09-08 DIAGNOSIS — S2242XA Multiple fractures of ribs, left side, initial encounter for closed fracture: Secondary | ICD-10-CM | POA: Diagnosis present

## 2019-09-08 DIAGNOSIS — S299XXA Unspecified injury of thorax, initial encounter: Secondary | ICD-10-CM | POA: Diagnosis present

## 2019-09-08 DIAGNOSIS — J449 Chronic obstructive pulmonary disease, unspecified: Secondary | ICD-10-CM | POA: Insufficient documentation

## 2019-09-08 DIAGNOSIS — F17210 Nicotine dependence, cigarettes, uncomplicated: Secondary | ICD-10-CM | POA: Insufficient documentation

## 2019-09-08 DIAGNOSIS — W19XXXA Unspecified fall, initial encounter: Secondary | ICD-10-CM | POA: Diagnosis not present

## 2019-09-08 DIAGNOSIS — Y929 Unspecified place or not applicable: Secondary | ICD-10-CM | POA: Diagnosis not present

## 2019-09-08 DIAGNOSIS — R1012 Left upper quadrant pain: Secondary | ICD-10-CM | POA: Diagnosis not present

## 2019-09-08 DIAGNOSIS — J939 Pneumothorax, unspecified: Secondary | ICD-10-CM

## 2019-09-08 HISTORY — DX: Chronic obstructive pulmonary disease, unspecified: J44.9

## 2019-09-08 LAB — BASIC METABOLIC PANEL
Anion gap: 14 (ref 5–15)
BUN: <5 mg/dL — ABNORMAL LOW (ref 8–23)
CO2: 22 mmol/L (ref 22–32)
Calcium: 8.4 mg/dL — ABNORMAL LOW (ref 8.9–10.3)
Chloride: 101 mmol/L (ref 98–111)
Creatinine, Ser: 0.7 mg/dL (ref 0.61–1.24)
GFR calc Af Amer: >60 mL/min (ref >60)
GFR calc non Af Amer: >60 mL/min (ref >60)
Glucose, Bld: 108 mg/dL — ABNORMAL HIGH (ref 70–99)
Potassium: 3.7 mmol/L (ref 3.5–5.1)
Sodium: 137 mmol/L (ref 135–145)

## 2019-09-08 LAB — CBC
HCT: 40.7 % (ref 39.0–52.0)
Hemoglobin: 12.9 g/dL — ABNORMAL LOW (ref 13.0–17.0)
MCH: 30.3 pg (ref 26.0–34.0)
MCHC: 31.7 g/dL (ref 30.0–36.0)
MCV: 95.5 fL (ref 80.0–100.0)
Platelets: 298 10*3/uL (ref 150–400)
RBC: 4.26 MIL/uL (ref 4.22–5.81)
RDW: 13.1 % (ref 11.5–15.5)
WBC: 9.3 10*3/uL (ref 4.0–10.5)
nRBC: 0 % (ref 0.0–0.2)

## 2019-09-08 LAB — ETHANOL: Alcohol, Ethyl (B): 226 mg/dL — ABNORMAL HIGH (ref <10)

## 2019-09-08 IMAGING — DX DG CHEST 2V
2 series · 3 of 3 positions shown · non-contrast
Comparison: Radiograph [DATE]

CLINICAL DATA: Chest pain and shortness of breath. Left-sided pain
after a fall.

EXAM:
CHEST - 2 VIEW

[chest lat]
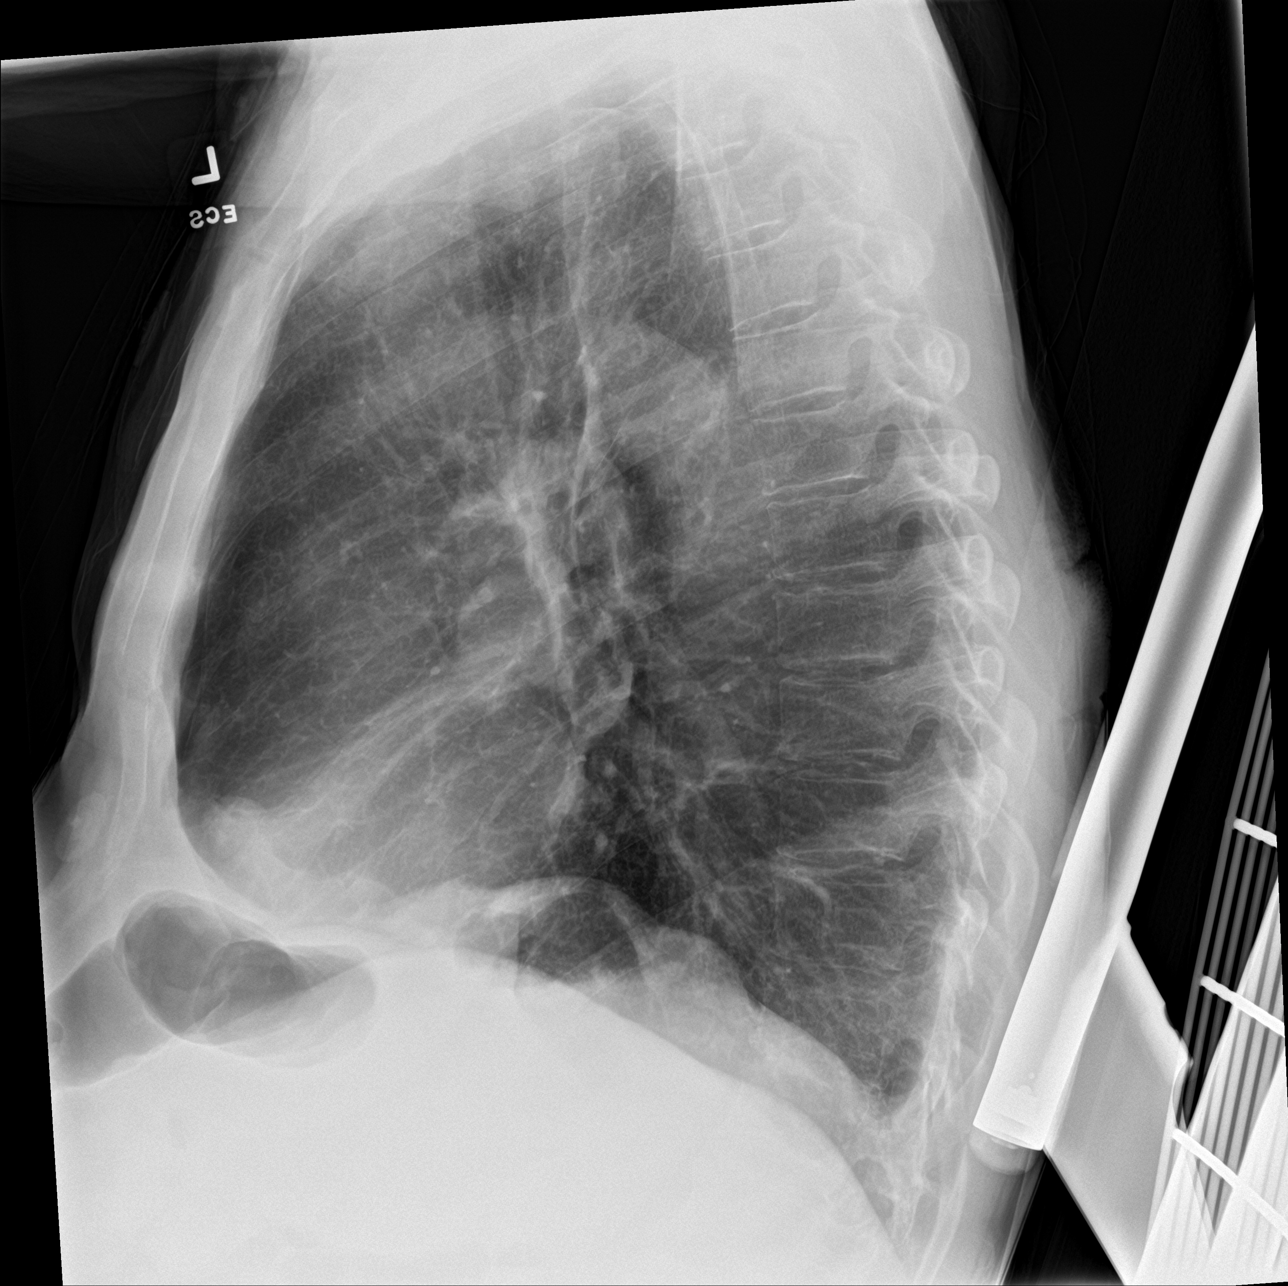

[Series 3: chest ap · 0.14mm/px · 2 of 2 slices shown]
[im 1/2]
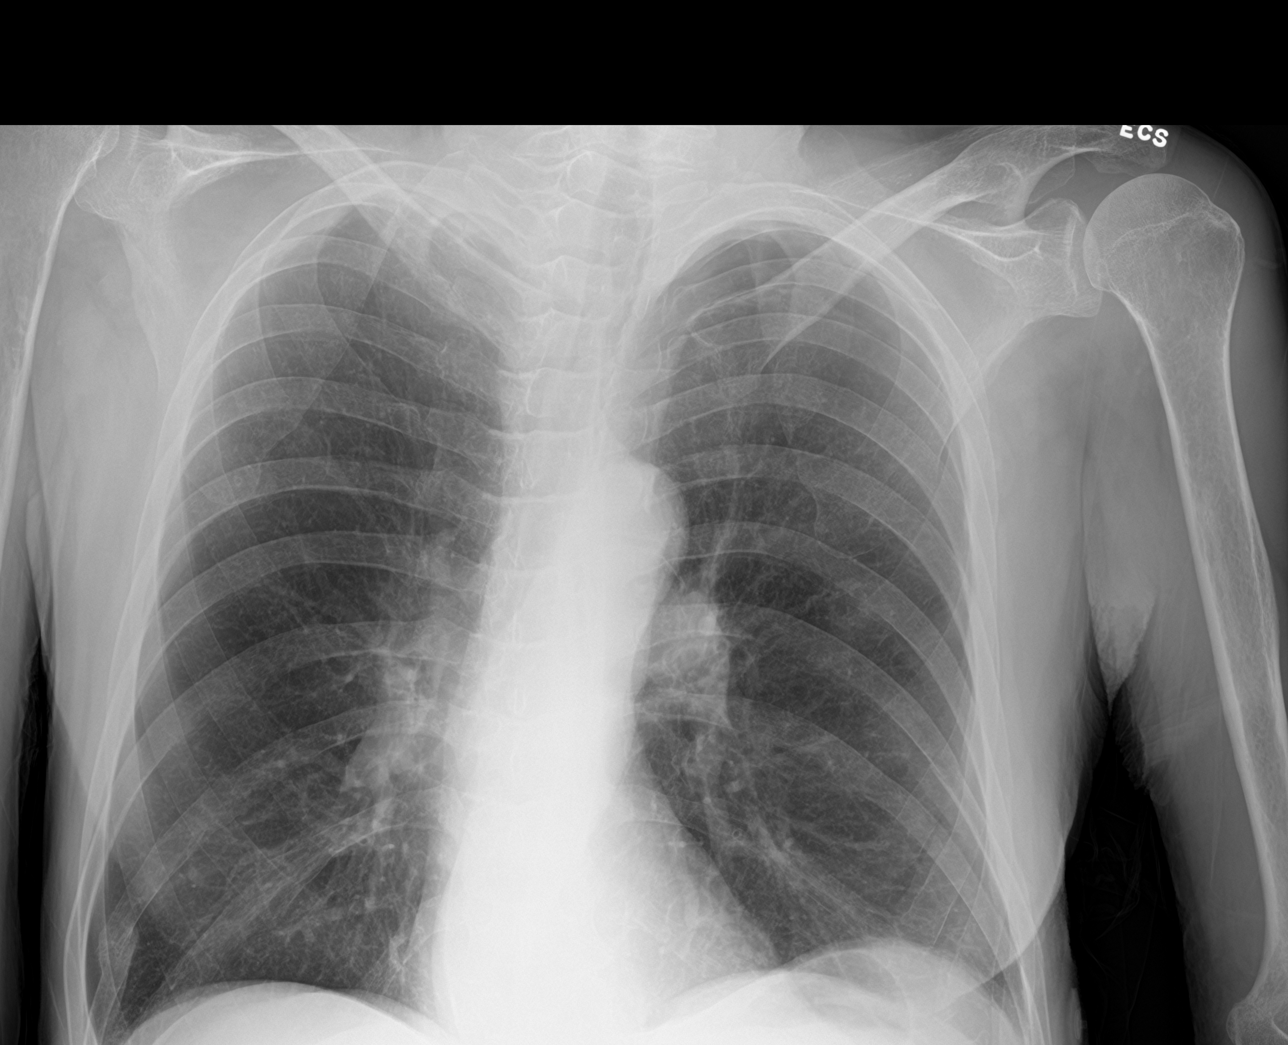
[im 2/2]
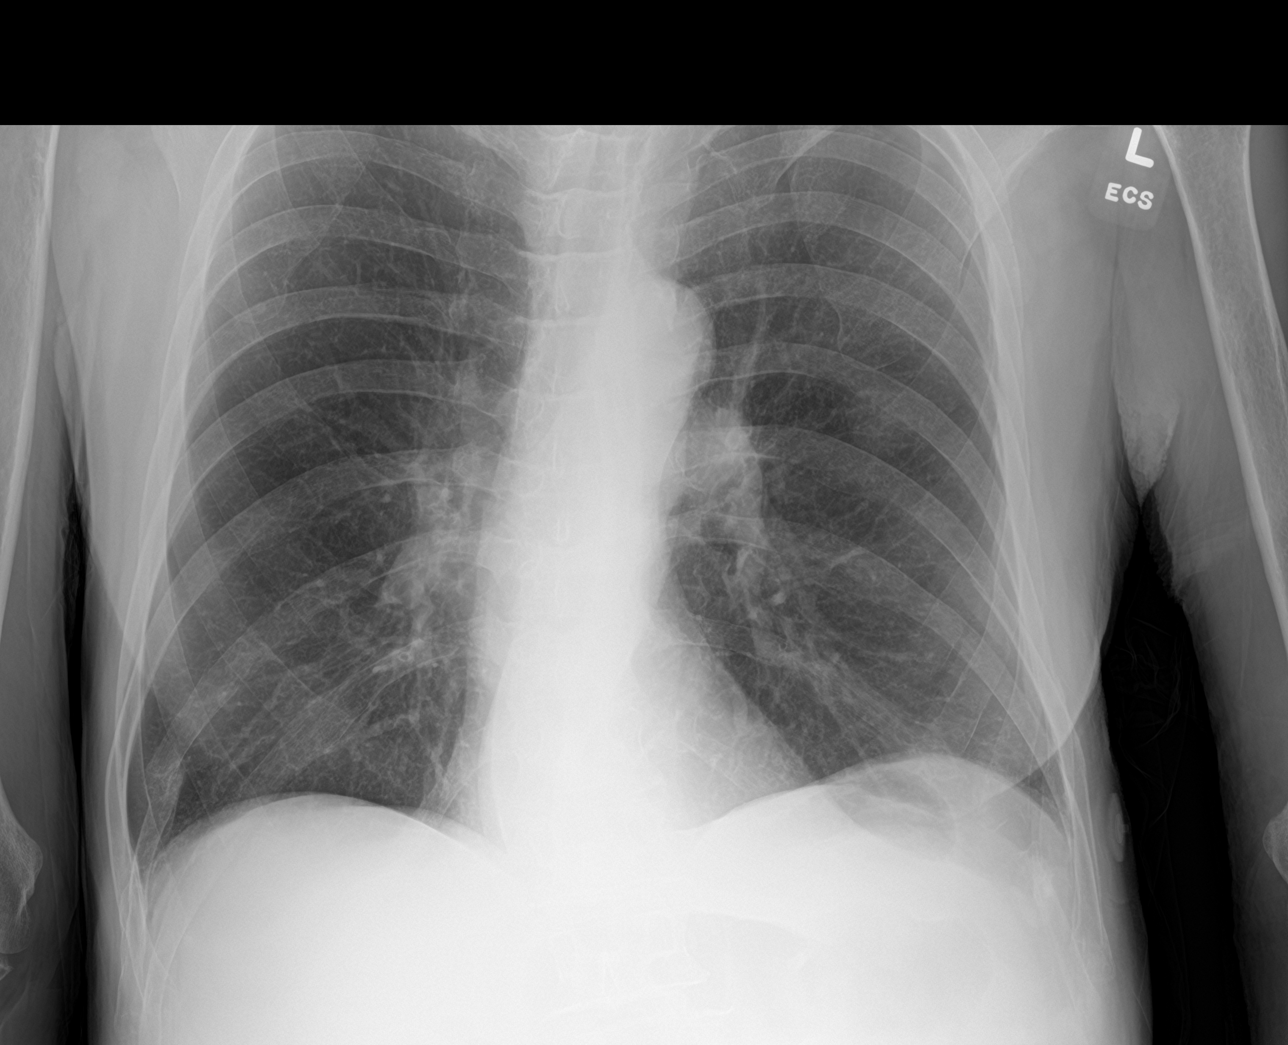

[3 of 3 positions shown; findings below may reference images not displayed]

FINDINGS: Small left pneumothorax best visualized at the apex, approximately
20%. This is best visualized at the level of the posterior left
fourth rib. No mediastinal shift. The cardiomediastinal contours are
normal. The lungs are clear. Pulmonary vasculature is normal.
Fracture of left posterior ninth rib, likely acute. Remote right rib
fractures as seen on prior exam.
IMPRESSION: Small left pneumothorax, approximately 20%. Fracture of left
posterior ninth rib, may be acute.

Critical Value/emergent results were called by telephone at the time
of interpretation on [DATE] at [DATE] to Dr ANN ,
who verbally acknowledged these results.

## 2019-09-08 NOTE — ED Triage Notes (Signed)
Pt presents via POV s/p fall. Pt reports fell due to being intoxicated. Denies LOC. C/o left chest/rib pain and SOB. Reports drinks ETOH daily. +ETOH. A&Ox4.

## 2019-09-09 ENCOUNTER — Emergency Department (HOSPITAL_COMMUNITY): Payer: Medicare Other

## 2019-09-09 ENCOUNTER — Inpatient Hospital Stay (HOSPITAL_COMMUNITY): Payer: Medicare Other

## 2019-09-09 DIAGNOSIS — J9311 Primary spontaneous pneumothorax: Secondary | ICD-10-CM | POA: Diagnosis not present

## 2019-09-09 DIAGNOSIS — S270XXA Traumatic pneumothorax, initial encounter: Secondary | ICD-10-CM | POA: Diagnosis present

## 2019-09-09 DIAGNOSIS — J449 Chronic obstructive pulmonary disease, unspecified: Secondary | ICD-10-CM | POA: Diagnosis not present

## 2019-09-09 LAB — COMPREHENSIVE METABOLIC PANEL
ALT: 26 U/L (ref 0–44)
AST: 50 U/L — ABNORMAL HIGH (ref 15–41)
Albumin: 3 g/dL — ABNORMAL LOW (ref 3.5–5.0)
Alkaline Phosphatase: 110 U/L (ref 38–126)
Anion gap: 16 — ABNORMAL HIGH (ref 5–15)
BUN: <5 mg/dL — ABNORMAL LOW (ref 8–23)
CO2: 18 mmol/L — ABNORMAL LOW (ref 22–32)
Calcium: 8.3 mg/dL — ABNORMAL LOW (ref 8.9–10.3)
Chloride: 100 mmol/L (ref 98–111)
Creatinine, Ser: 0.63 mg/dL (ref 0.61–1.24)
GFR calc Af Amer: >60 mL/min (ref >60)
GFR calc non Af Amer: >60 mL/min (ref >60)
Glucose, Bld: 117 mg/dL — ABNORMAL HIGH (ref 70–99)
Potassium: 4.1 mmol/L (ref 3.5–5.1)
Sodium: 134 mmol/L — ABNORMAL LOW (ref 135–145)
Total Bilirubin: 1.1 mg/dL (ref 0.3–1.2)
Total Protein: 6.8 g/dL (ref 6.5–8.1)

## 2019-09-09 LAB — CBC
HCT: 39.3 % (ref 39.0–52.0)
Hemoglobin: 12.6 g/dL — ABNORMAL LOW (ref 13.0–17.0)
MCH: 30.4 pg (ref 26.0–34.0)
MCHC: 32.1 g/dL (ref 30.0–36.0)
MCV: 94.9 fL (ref 80.0–100.0)
Platelets: 251 10*3/uL (ref 150–400)
RBC: 4.14 MIL/uL — ABNORMAL LOW (ref 4.22–5.81)
RDW: 13.2 % (ref 11.5–15.5)
WBC: 12.8 10*3/uL — ABNORMAL HIGH (ref 4.0–10.5)
nRBC: 0 % (ref 0.0–0.2)

## 2019-09-09 LAB — PHOSPHORUS: Phosphorus: 3.5 mg/dL (ref 2.5–4.6)

## 2019-09-09 LAB — MAGNESIUM: Magnesium: 1.8 mg/dL (ref 1.7–2.4)

## 2019-09-09 LAB — HIV ANTIBODY (ROUTINE TESTING W REFLEX): HIV Screen 4th Generation wRfx: NONREACTIVE

## 2019-09-09 LAB — SARS CORONAVIRUS 2 BY RT PCR (HOSPITAL ORDER, PERFORMED IN ~~LOC~~ HOSPITAL LAB): SARS Coronavirus 2: NEGATIVE

## 2019-09-09 IMAGING — CT CT ABD-PELV W/ CM
2 of 5 series · 14 of 46 positions shown, 16 images · IV contrast (omnipaque)
Comparison: Chest radiographs [TM] hours today.

CLINICAL DATA: 64 year old male status post fall with left side
chest pain. Left 9th rib fracture and small pneumothorax visible on
chest radiographs today.

EXAM:
CT CHEST, ABDOMEN, AND PELVIS WITH CONTRAST
TECHNIQUE: Multidetector CT imaging of the chest, abdomen and pelvis was
performed following the standard protocol during bolus
administration of intravenous contrast.
CONTRAST:  100mL OMNIPAQUE IOHEXOL 300 MG/ML  SOLN

[Series 3: cap with (person_name) · axial · 0.71mm/px · z∈[+869,+1429]mm · 11 of 136 slices shown, 13 images]
[im 12/136  soft-tissue]
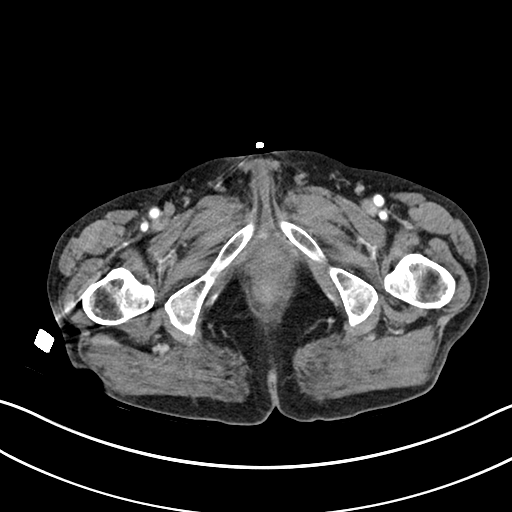
[im 12/136  bone]
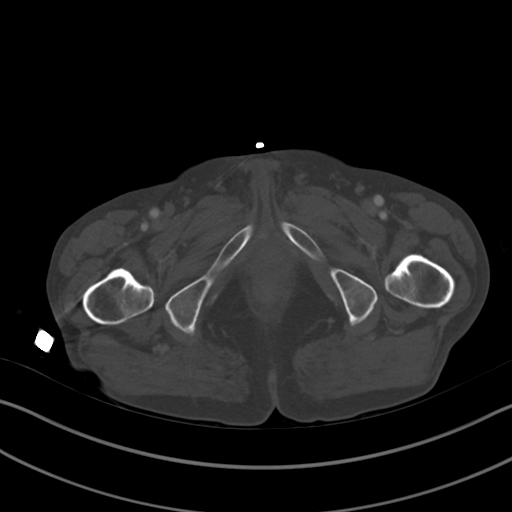
[im 23/136  soft-tissue]
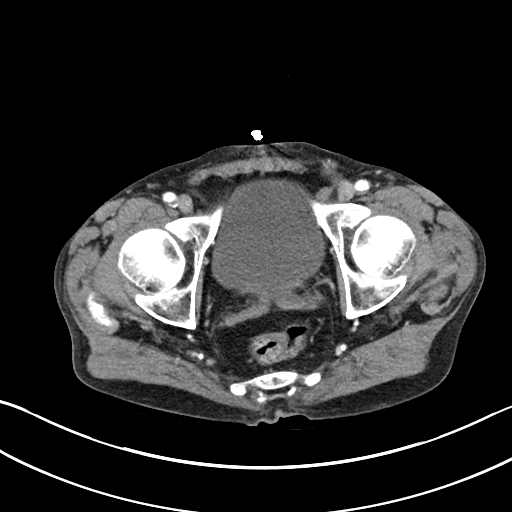
[im 34/136  soft-tissue]
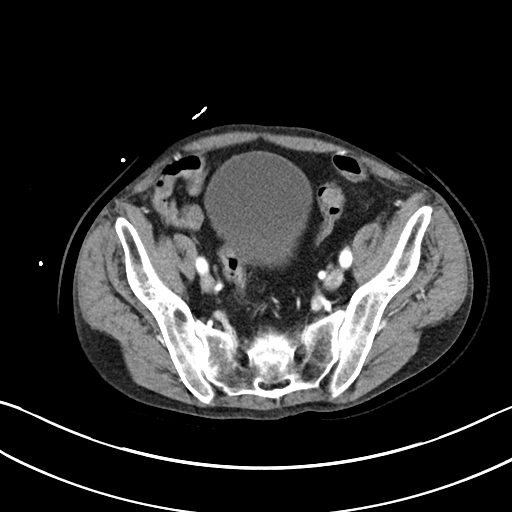
[im 46/136  soft-tissue]
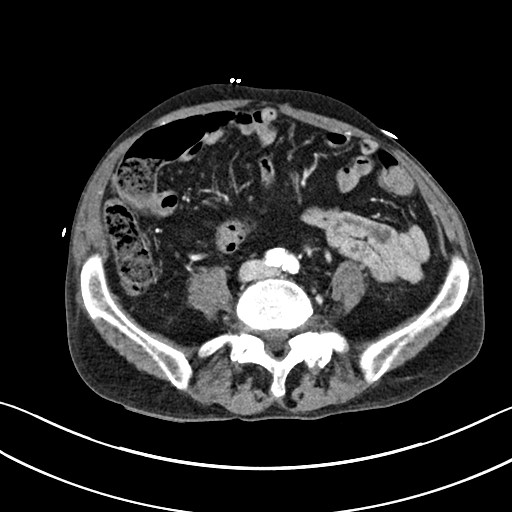
[im 57/136  soft-tissue]
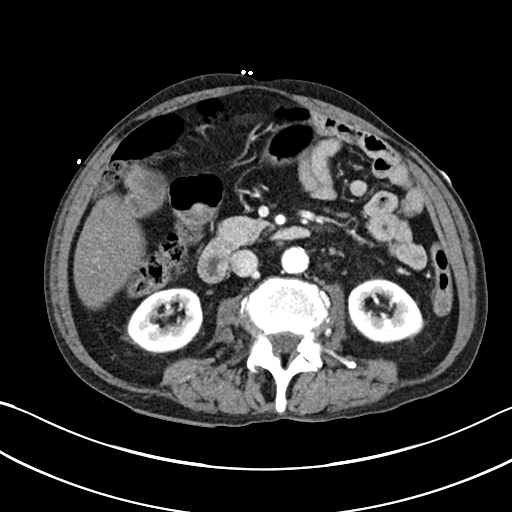
[im 68/136  soft-tissue]
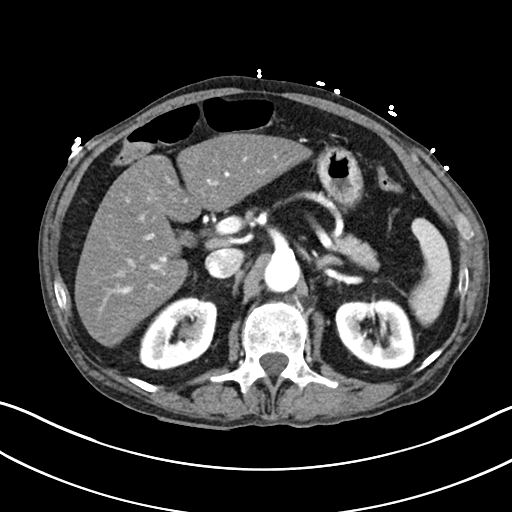
[im 79/136  soft-tissue]
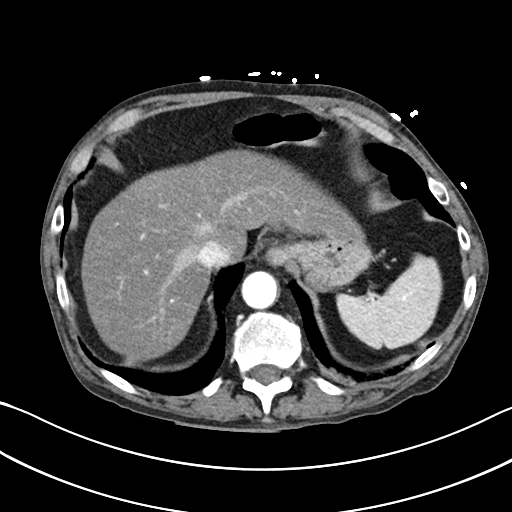
[im 91/136  soft-tissue]
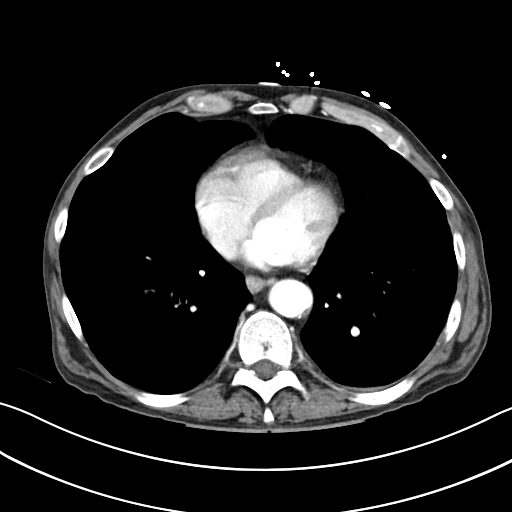
[im 102/136  soft-tissue]
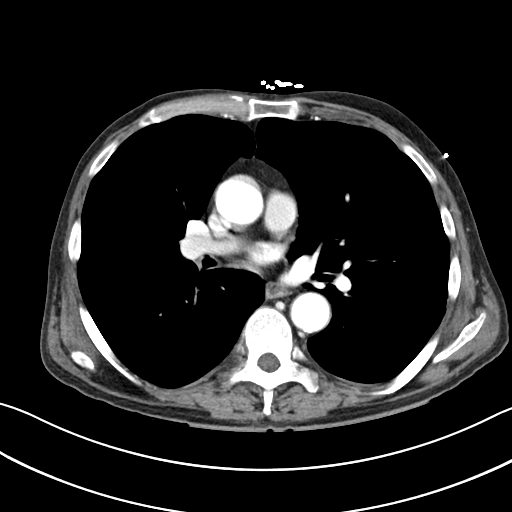
[im 102/136  bone]
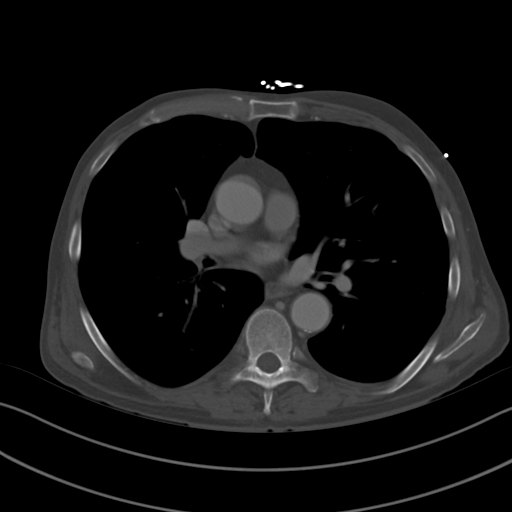
[im 113/136  soft-tissue]
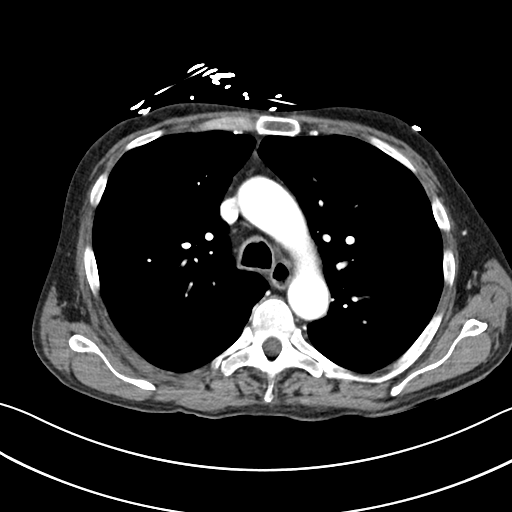
[im 124/136  soft-tissue]
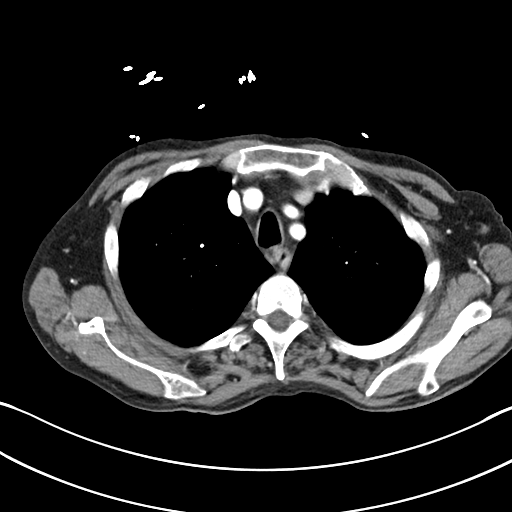

[Series 6: (person_name) · coronal · 0.81mm/px · 3 of 101 slices shown]
[im 34/101  soft-tissue]
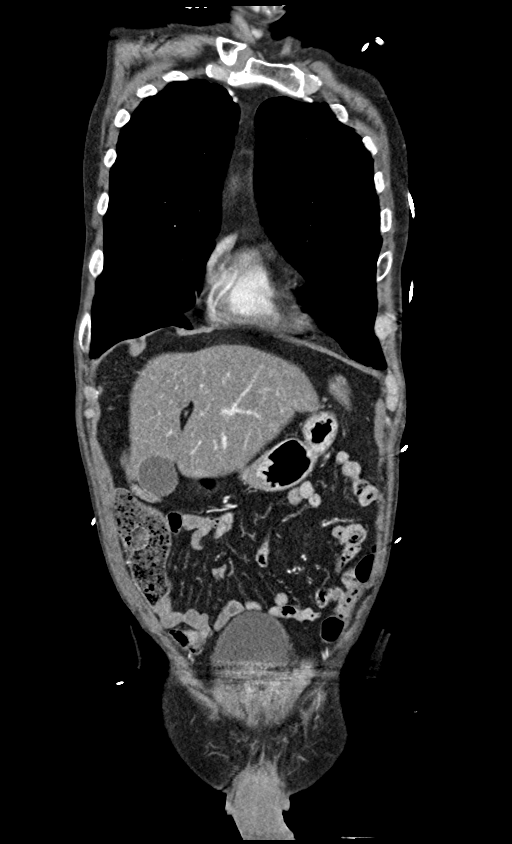
[im 45/101  soft-tissue]
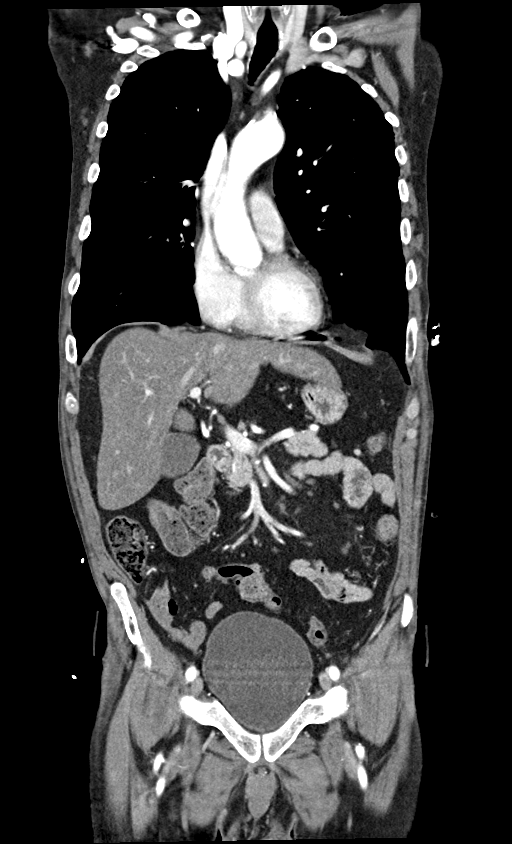
[im 56/101  soft-tissue]
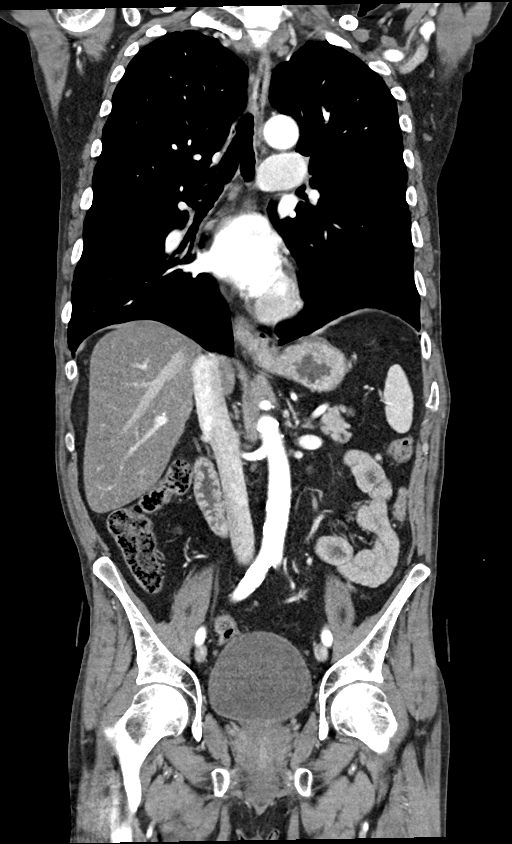

[14 of 46 positions shown; findings below may reference images not displayed]

FINDINGS: CT CHEST FINDINGS

Cardiovascular: Mildly tortuous thoracic aorta appears intact. Mild
Calcified aortic atherosclerosis. No cardiomegaly or pericardial
effusion. Other major mediastinal vascular structures appear intact.

Mediastinum/Nodes: Negative. No mediastinal hematoma or
lymphadenopathy.

Lungs/Pleura: Dependent retained secretions in the trachea, adherent
to the anterior carina. But the major airways remain patent.

There is a small left pneumothorax (series 5, image 106). No
superimposed left pleural effusion. Atelectasis suspected in the
left costophrenic angle, with no definite pulmonary contusion.

Right apical paraseptal emphysema. Mild atelectasis and scarring at
the right lung base.

Musculoskeletal: There is a mix of acute and chronic rib fractures.
On the right side there are chronic fractures of the posterolateral
9th and 10th ribs, along with a mild chronic fracture of the
posterior right 11th rib.

On the left there are acute fractures of the anterior left 4th,
lateral left 6th, 7th, 8th and 9th ribs. Those fractures are
nondisplaced to minimally displaced. There are numerous superimposed
chronic left rib fractures, including an ununited fracture of the
lateral left 11th rib.

No sternal fracture. Thoracic vertebrae appear intact. Chronic
fracture of the distal left clavicle suspected. Other visible
shoulder osseous structures appear intact.

CT ABDOMEN PELVIS FINDINGS

Hepatobiliary: Hepatic steatosis. The liver and gallbladder appear
intact.

Pancreas: Negative.

Spleen: Negative.

Adrenals/Urinary Tract: Normal adrenal glands. Bilateral renal
enhancement and contrast excretion is symmetric and normal. Normal
proximal ureters. Distended but otherwise unremarkable urinary
bladder. No nephrolithiasis.

Stomach/Bowel: Negative aside from redundant large bowel, cecum on a
lax mesentery, diminutive or absent appendix. No free air or free
fluid.

Vascular/Lymphatic: Aortoiliac calcified atherosclerosis. Major
arterial structures remain patent. Portal venous system is patent.
No lymphadenopathy.

Reproductive: Negative.

Other: No pelvic free fluid.

Musculoskeletal: Transitional lumbosacral anatomy. No acute osseous
abnormality identified.
IMPRESSION: 1. Persistent small left pneumothorax. No pleural effusion or
pulmonary contusion identified.

2. Acute fractures of the left 4th and 6th through 9th ribs.
Underlying numerous chronic bilateral rib fractures.

3. No other acute traumatic injury identified in the chest, abdomen,
or pelvis.
4. Hepatic steatosis. Distended urinary bladder. Aortic
Atherosclerosis ([TM]-[TM]) and Emphysema ([TM]-[TM]).

## 2019-09-09 IMAGING — DX DG CHEST 1V PORT
1 series · 2 of 2 positions shown · non-contrast
Comparison: Yesterday

CLINICAL DATA: Pneumothorax follow-up

EXAM:
PORTABLE CHEST 1 VIEW

[Series 1: chest · 0.14mm/px · 2 of 2 slices shown]
[im 1/2]
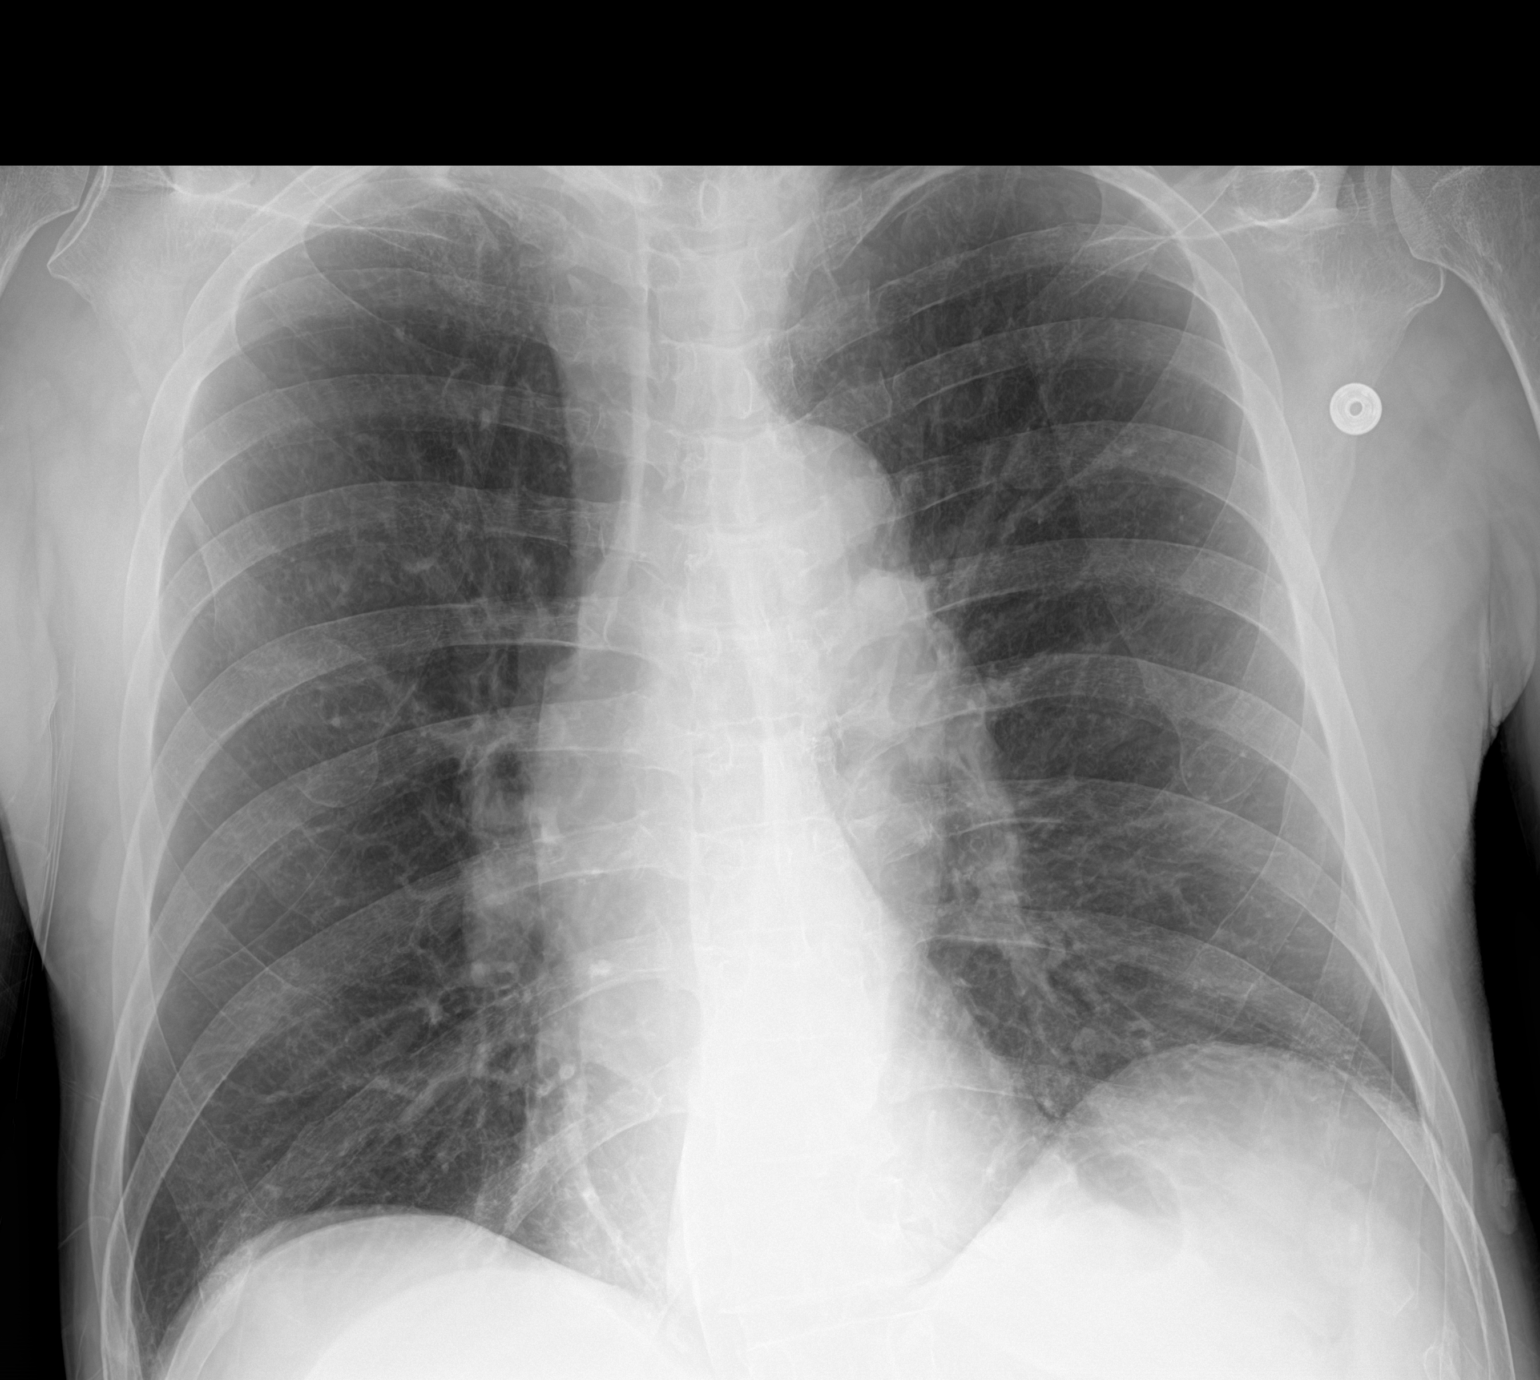
[im 2/2]
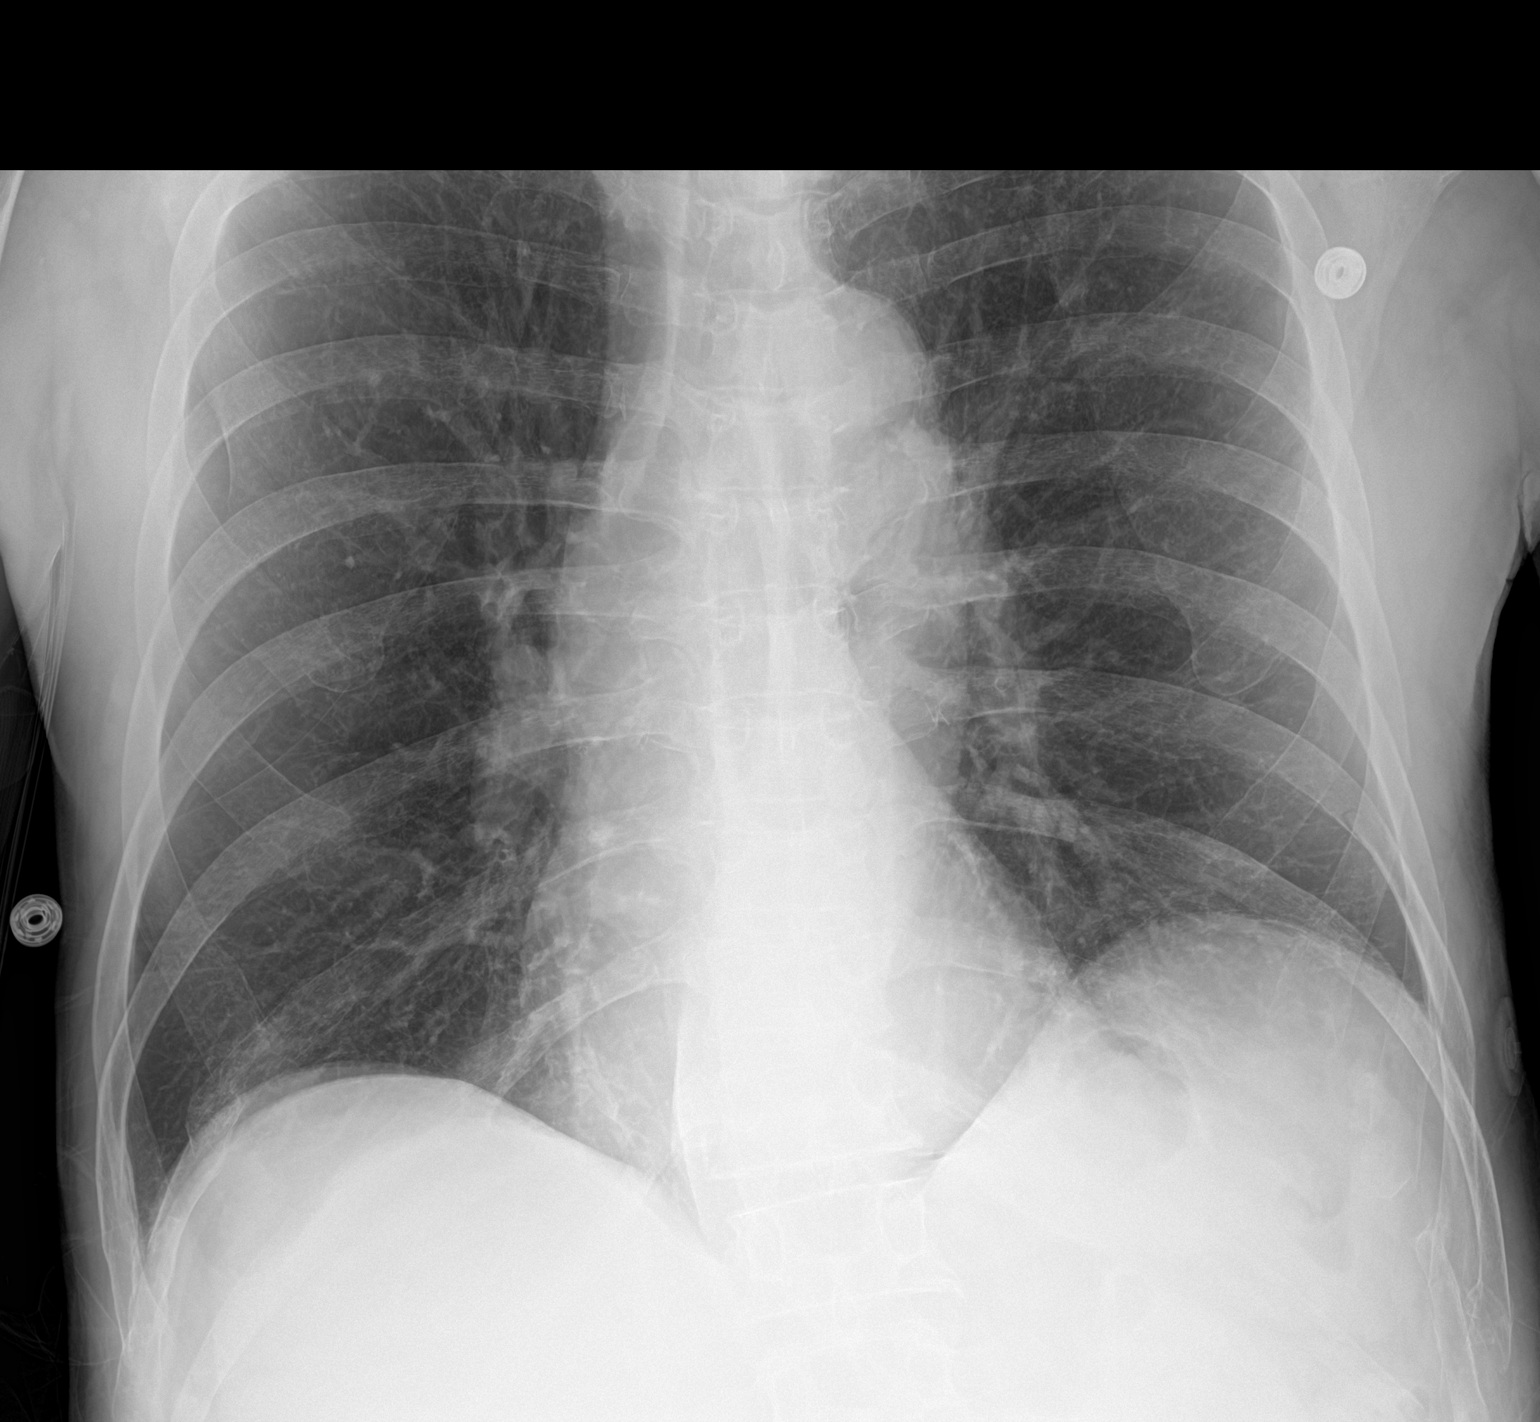

[2 of 2 positions shown; findings below may reference images not displayed]

FINDINGS: A small left apical pneumothorax is unchanged. No edema or
consolidation. Normal heart size and mediastinal contours.
IMPRESSION: Unchanged small left apical pneumothorax.

## 2019-09-09 IMAGING — CT CT CHEST W/ CM
2 of 5 series · 14 of 46 positions shown, 16 images · IV contrast (omnipaque)
Comparison: Chest radiographs [TM] hours today.

CLINICAL DATA: 64 year old male status post fall with left side
chest pain. Left 9th rib fracture and small pneumothorax visible on
chest radiographs today.

EXAM:
CT CHEST, ABDOMEN, AND PELVIS WITH CONTRAST
TECHNIQUE: Multidetector CT imaging of the chest, abdomen and pelvis was
performed following the standard protocol during bolus
administration of intravenous contrast.
CONTRAST:  100mL OMNIPAQUE IOHEXOL 300 MG/ML  SOLN

[Series 3: cap with (person_name) · axial · 0.71mm/px · z∈[+869,+1429]mm · 11 of 136 slices shown, 13 images]
[im 12/136  soft-tissue]
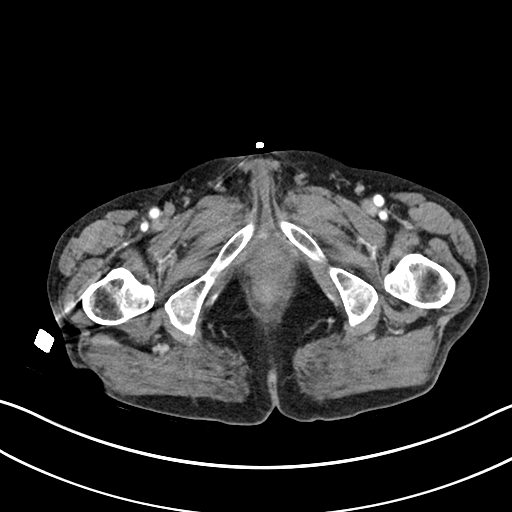
[im 12/136  bone]
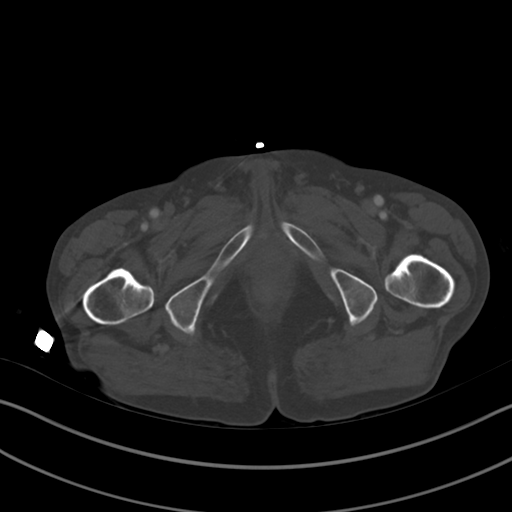
[im 23/136  soft-tissue]
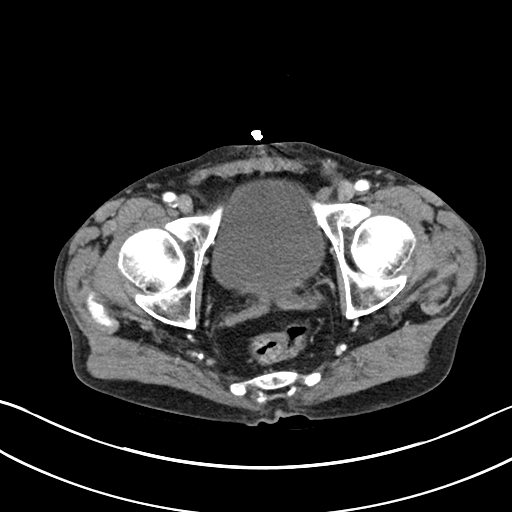
[im 34/136  soft-tissue]
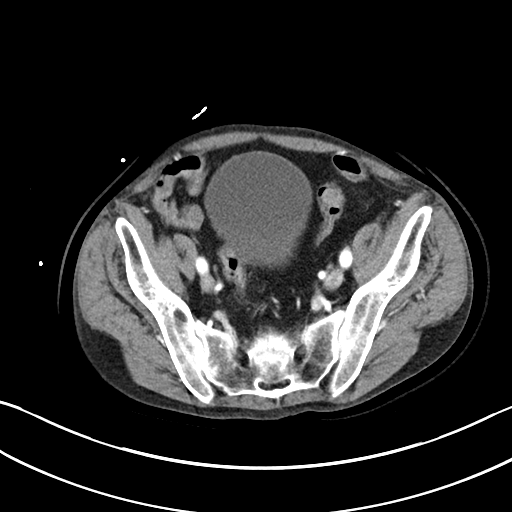
[im 46/136  soft-tissue]
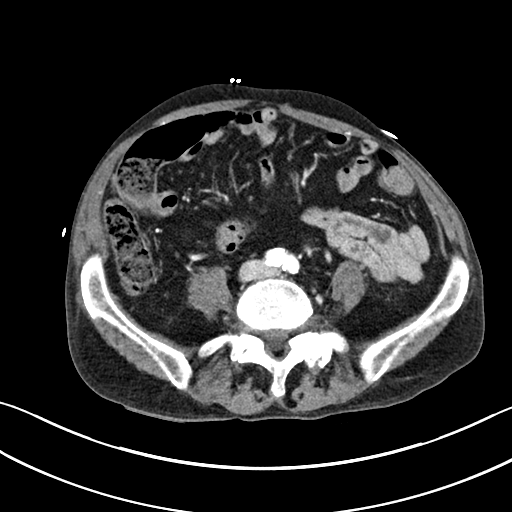
[im 57/136  soft-tissue]
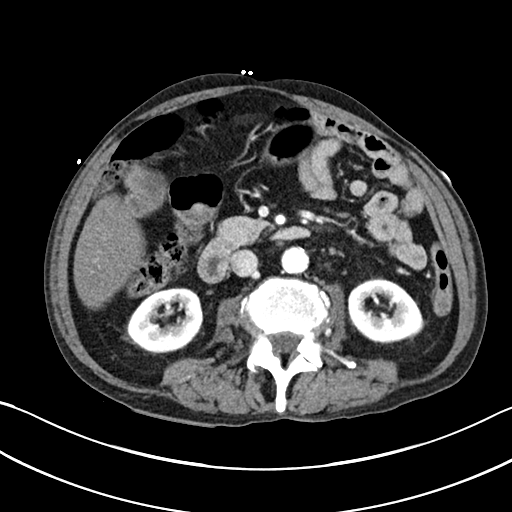
[im 68/136  soft-tissue]
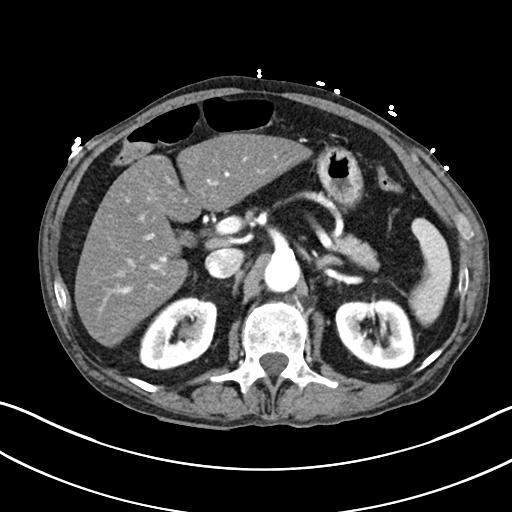
[im 79/136  soft-tissue]
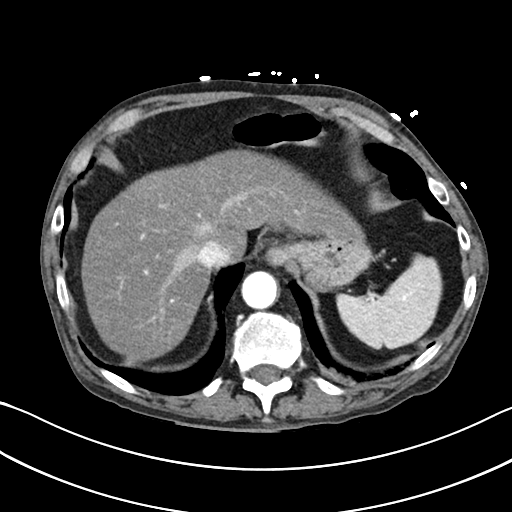
[im 91/136  soft-tissue]
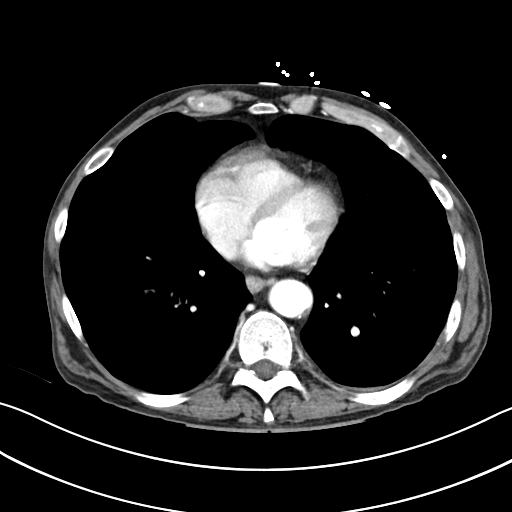
[im 102/136  soft-tissue]
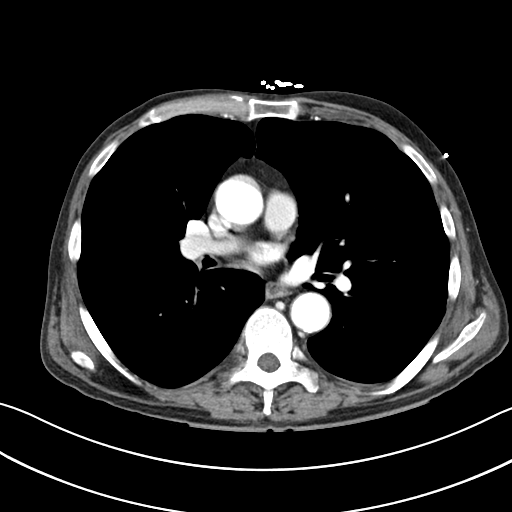
[im 102/136  bone]
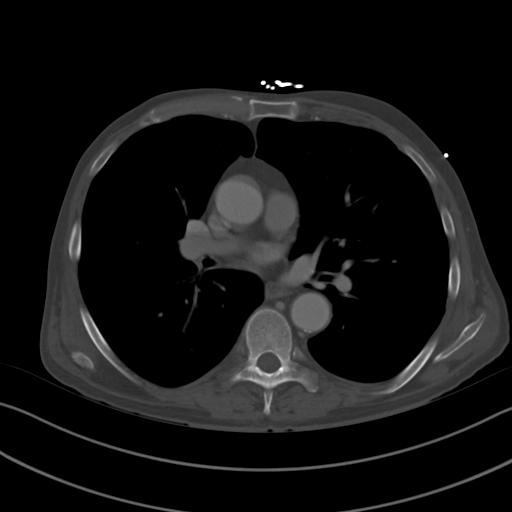
[im 113/136  soft-tissue]
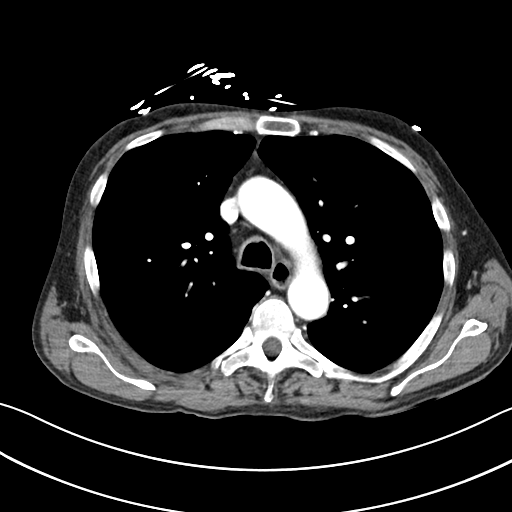
[im 124/136  soft-tissue]
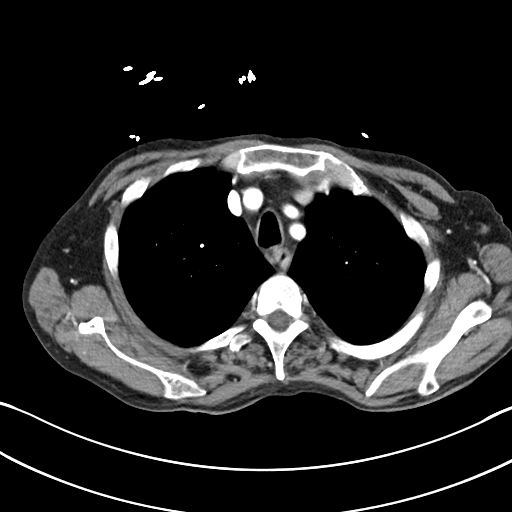

[Series 6: (person_name) · coronal · 0.81mm/px · 3 of 101 slices shown]
[im 34/101  soft-tissue]
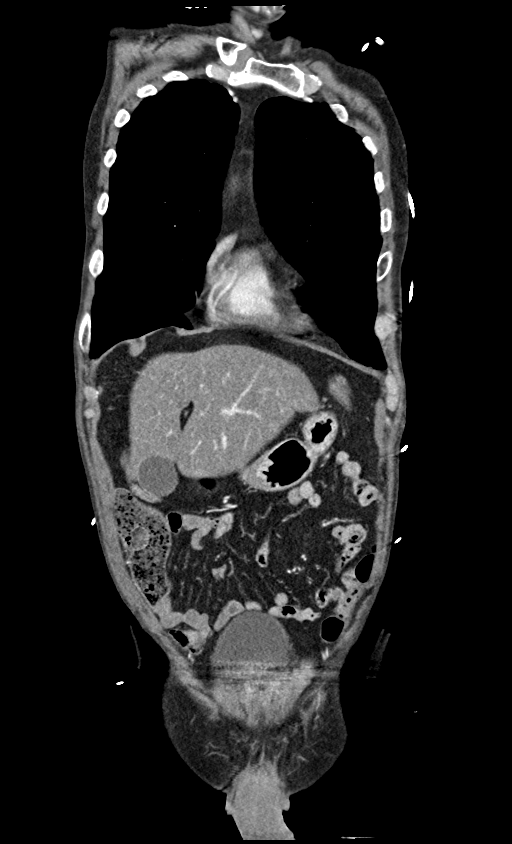
[im 45/101  soft-tissue]
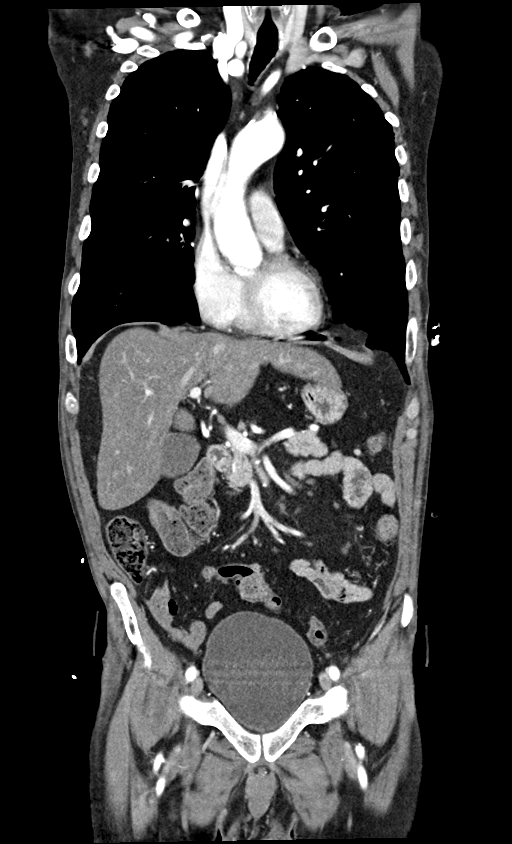
[im 56/101  soft-tissue]
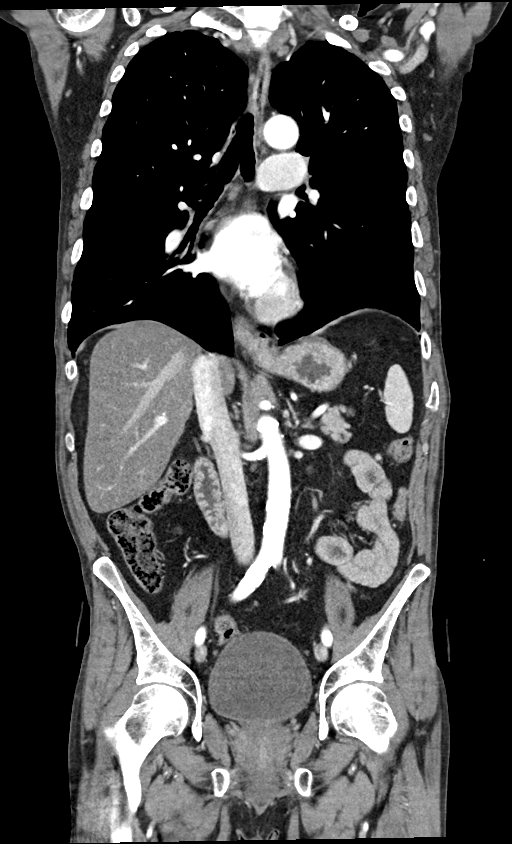

[14 of 46 positions shown; findings below may reference images not displayed]

FINDINGS: CT CHEST FINDINGS

Cardiovascular: Mildly tortuous thoracic aorta appears intact. Mild
Calcified aortic atherosclerosis. No cardiomegaly or pericardial
effusion. Other major mediastinal vascular structures appear intact.

Mediastinum/Nodes: Negative. No mediastinal hematoma or
lymphadenopathy.

Lungs/Pleura: Dependent retained secretions in the trachea, adherent
to the anterior carina. But the major airways remain patent.

There is a small left pneumothorax (series 5, image 106). No
superimposed left pleural effusion. Atelectasis suspected in the
left costophrenic angle, with no definite pulmonary contusion.

Right apical paraseptal emphysema. Mild atelectasis and scarring at
the right lung base.

Musculoskeletal: There is a mix of acute and chronic rib fractures.
On the right side there are chronic fractures of the posterolateral
9th and 10th ribs, along with a mild chronic fracture of the
posterior right 11th rib.

On the left there are acute fractures of the anterior left 4th,
lateral left 6th, 7th, 8th and 9th ribs. Those fractures are
nondisplaced to minimally displaced. There are numerous superimposed
chronic left rib fractures, including an ununited fracture of the
lateral left 11th rib.

No sternal fracture. Thoracic vertebrae appear intact. Chronic
fracture of the distal left clavicle suspected. Other visible
shoulder osseous structures appear intact.

CT ABDOMEN PELVIS FINDINGS

Hepatobiliary: Hepatic steatosis. The liver and gallbladder appear
intact.

Pancreas: Negative.

Spleen: Negative.

Adrenals/Urinary Tract: Normal adrenal glands. Bilateral renal
enhancement and contrast excretion is symmetric and normal. Normal
proximal ureters. Distended but otherwise unremarkable urinary
bladder. No nephrolithiasis.

Stomach/Bowel: Negative aside from redundant large bowel, cecum on a
lax mesentery, diminutive or absent appendix. No free air or free
fluid.

Vascular/Lymphatic: Aortoiliac calcified atherosclerosis. Major
arterial structures remain patent. Portal venous system is patent.
No lymphadenopathy.

Reproductive: Negative.

Other: No pelvic free fluid.

Musculoskeletal: Transitional lumbosacral anatomy. No acute osseous
abnormality identified.
IMPRESSION: 1. Persistent small left pneumothorax. No pleural effusion or
pulmonary contusion identified.

2. Acute fractures of the left 4th and 6th through 9th ribs.
Underlying numerous chronic bilateral rib fractures.

3. No other acute traumatic injury identified in the chest, abdomen,
or pelvis.
4. Hepatic steatosis. Distended urinary bladder. Aortic
Atherosclerosis ([TM]-[TM]) and Emphysema ([TM]-[TM]).

## 2019-09-09 MED ORDER — LORAZEPAM 2 MG/ML IJ SOLN
1.0000 mg | INTRAMUSCULAR | Status: DC | PRN
  Administered 2019-09-09: 2 mg via INTRAVENOUS
  Administered 2019-09-09: 4 mg via INTRAVENOUS
  Filled 2019-09-09: qty 2
  Filled 2019-09-09: qty 1

## 2019-09-09 MED ORDER — OXYCODONE HCL 5 MG PO TABS: 5.0000 mg | ORAL_TABLET | Freq: Four times a day (QID) | ORAL | 0 refills | Status: DC | PRN

## 2019-09-09 MED ORDER — IPRATROPIUM-ALBUTEROL 0.5-2.5 (3) MG/3ML IN SOLN
3.0000 mL | Freq: Four times a day (QID) | RESPIRATORY_TRACT | Status: DC | PRN
  Filled 2019-09-09: qty 3

## 2019-09-09 MED ORDER — FOLIC ACID 1 MG PO TABS
1.0000 mg | ORAL_TABLET | Freq: Every day | ORAL | Status: DC
  Administered 2019-09-09: 1 mg via ORAL
  Filled 2019-09-09: qty 1

## 2019-09-09 MED ORDER — HYDROMORPHONE HCL 1 MG/ML IJ SOLN
0.5000 mg | Freq: Once | INTRAMUSCULAR | Status: AC
  Administered 2019-09-09: 0.5 mg via INTRAVENOUS
  Filled 2019-09-09: qty 1

## 2019-09-09 MED ORDER — DOCUSATE SODIUM 100 MG PO CAPS
100.0000 mg | ORAL_CAPSULE | Freq: Two times a day (BID) | ORAL | Status: DC
  Administered 2019-09-09: 100 mg via ORAL
  Filled 2019-09-09 (×2): qty 1

## 2019-09-09 MED ORDER — OXYCODONE HCL 5 MG PO TABS
5.0000 mg | ORAL_TABLET | ORAL | Status: DC | PRN
  Administered 2019-09-09: 10 mg via ORAL
  Filled 2019-09-09: qty 2

## 2019-09-09 MED ORDER — FOLIC ACID 5 MG/ML IJ SOLN
1.0000 mg | Freq: Every day | INTRAMUSCULAR | Status: DC
  Filled 2019-09-09 (×3): qty 0.2

## 2019-09-09 MED ORDER — ADULT MULTIVITAMIN W/MINERALS CH
1.0000 | ORAL_TABLET | Freq: Every day | ORAL | Status: DC
  Administered 2019-09-09: 1 via ORAL
  Filled 2019-09-09: qty 1

## 2019-09-09 MED ORDER — OXYCODONE HCL 5 MG PO TABS: 5.0000 mg | ORAL_TABLET | ORAL | Status: DC | PRN

## 2019-09-09 MED ORDER — MORPHINE SULFATE (PF) 2 MG/ML IV SOLN: 1.0000 mg | INTRAVENOUS | Status: DC | PRN

## 2019-09-09 MED ORDER — POTASSIUM CHLORIDE IN NACL 20-0.45 MEQ/L-% IV SOLN
INTRAVENOUS | Status: AC
  Filled 2019-09-09: qty 1000

## 2019-09-09 MED ORDER — LORAZEPAM 1 MG PO TABS: 0.0000 mg | ORAL_TABLET | Freq: Two times a day (BID) | ORAL | Status: DC

## 2019-09-09 MED ORDER — THIAMINE HCL 100 MG/ML IJ SOLN: 100.0000 mg | Freq: Every day | INTRAMUSCULAR | Status: DC

## 2019-09-09 MED ORDER — LORAZEPAM 1 MG PO TABS: 1.0000 mg | ORAL_TABLET | ORAL | Status: DC | PRN

## 2019-09-09 MED ORDER — ENSURE ENLIVE PO LIQD
237.0000 mL | Freq: Two times a day (BID) | ORAL | Status: DC
  Administered 2019-09-09: 237 mL via ORAL

## 2019-09-09 MED ORDER — LORAZEPAM 1 MG PO TABS
0.0000 mg | ORAL_TABLET | Freq: Four times a day (QID) | ORAL | Status: DC
  Administered 2019-09-09: 1 mg via ORAL
  Filled 2019-09-09: qty 1

## 2019-09-09 MED ORDER — ONDANSETRON 4 MG PO TBDP: 4.0000 mg | ORAL_TABLET | Freq: Four times a day (QID) | ORAL | Status: DC | PRN

## 2019-09-09 MED ORDER — MORPHINE SULFATE (PF) 2 MG/ML IV SOLN: 2.0000 mg | INTRAVENOUS | Status: DC | PRN

## 2019-09-09 MED ORDER — ENOXAPARIN SODIUM 40 MG/0.4ML ~~LOC~~ SOLN
40.0000 mg | SUBCUTANEOUS | Status: DC
  Administered 2019-09-09: 40 mg via SUBCUTANEOUS
  Filled 2019-09-09: qty 0.4

## 2019-09-09 MED ORDER — SPIRITUS FRUMENTI
1.0000 | ORAL | Status: DC | PRN
  Filled 2019-09-09 (×2): qty 1

## 2019-09-09 MED ORDER — ACETAMINOPHEN 325 MG PO TABS: 650.0000 mg | ORAL_TABLET | ORAL | Status: DC | PRN

## 2019-09-09 MED ORDER — ADULT MULTIVITAMIN W/MINERALS CH: 1.0000 | ORAL_TABLET | Freq: Every day | ORAL | Status: DC

## 2019-09-09 MED ORDER — ONDANSETRON HCL 4 MG/2ML IJ SOLN: 4.0000 mg | Freq: Four times a day (QID) | INTRAMUSCULAR | Status: DC | PRN

## 2019-09-09 MED ORDER — ACETAMINOPHEN 500 MG PO TABS: 1000.0000 mg | ORAL_TABLET | Freq: Three times a day (TID) | ORAL | Status: DC

## 2019-09-09 MED ORDER — IPRATROPIUM-ALBUTEROL 0.5-2.5 (3) MG/3ML IN SOLN
3.0000 mL | Freq: Once | RESPIRATORY_TRACT | Status: AC
  Administered 2019-09-09: 3 mL via RESPIRATORY_TRACT

## 2019-09-09 MED ORDER — IOHEXOL 300 MG/ML  SOLN
100.0000 mL | Freq: Once | INTRAMUSCULAR | Status: AC | PRN
  Administered 2019-09-09: 100 mL via INTRAVENOUS

## 2019-09-09 MED ORDER — THIAMINE HCL 100 MG PO TABS
100.0000 mg | ORAL_TABLET | Freq: Every day | ORAL | Status: DC
  Administered 2019-09-09: 100 mg via ORAL
  Filled 2019-09-09: qty 1

## 2019-09-09 NOTE — ED Provider Notes (Signed)
[facility] EMERGENCY DEPARTMENT Provider Note   CSN: [id number] Arrival date & time: 09/08/19  1957     History Chief Complaint  Patient presents with   Fall   Shortness of Breath    Marc Mcdonald is a 64 y.o. male.  HPI Patient presents after a fall.  States he was working outside and fell because he been drinking a lot.  Planing of pain in his left chest.  Worse with breathing.  Not on oxygen at home.  History of COPD and does smoke.  No fevers.  No coughing.  Did not hit his head.  Not on anticoagulation.  Pain is worse with movement.  States he has previously broken some ribs on the side.    Past Medical History:  Diagnosis Date   COPD (chronic obstructive pulmonary disease) ([place])     There are no problems to display for this patient.   History reviewed. No pertinent surgical history.     History reviewed. No pertinent family history.  Social History   Tobacco Use   Smoking status: Current Every Day Smoker    Types: Cigarettes   Smokeless tobacco: Never Used  Substance Use Topics   Alcohol use: Yes    Comment: daily   Drug use: Not on file    Home Medications Prior to Admission medications   Medication Sig Start Date End Date Taking? Authorizing Provider  ibuprofen (ADVIL) 200 MG tablet Take 200 mg by mouth every 6 (six) hours as needed for moderate pain.   Yes [provider]  morphine (MSIR) 15 MG tablet Take 1 tablet (15 mg total) by mouth every 4 (four) hours as needed for severe pain. Patient not taking: Reported on 09/08/2019 03/22/16   Deno Etienne, DO    Allergies    Patient has no known allergies.  Review of Systems   Review of Systems  Constitutional: Negative for appetite change.  HENT: Negative for congestion.   Cardiovascular: Positive for chest pain. Negative for leg swelling.  Gastrointestinal: Positive for abdominal pain.  Genitourinary: Negative for flank pain.  Musculoskeletal: Negative for back  pain.  Neurological: Negative for weakness.  Psychiatric/Behavioral: Negative for confusion.    Physical Exam Updated Vital Signs BP 127/79 (BP Location: Right Arm)    Pulse (!) 103    Temp 97.9 F (36.6 C) (Oral)    Resp (!) 23    Ht 5\' 11"  (1.803 m)    Wt 63.5 kg    SpO2 93%    BMI 19.53 kg/m   Physical Exam Vitals and nursing note reviewed.  HENT:     Head: Atraumatic.  Cardiovascular:     Rate and Rhythm: Regular rhythm. Tachycardia present.  Pulmonary:     Breath sounds: No wheezing, rhonchi or rales.  Chest:     Chest wall: Tenderness present.     Comments: Tenderness to left posterior lateral lower ribs.  No subcu emphysema. Abdominal:     Comments: Tenderness in left upper abdomen without ecchymosis.  Musculoskeletal:     Right lower leg: No tenderness.     Left lower leg: No tenderness.  Skin:    General: Skin is warm.     Capillary Refill: Capillary refill takes less than 2 seconds.  Neurological:     Mental Status: He is alert and oriented to person, place, and time.     ED Results / Procedures / Treatments   Labs (all labs ordered are listed, but only abnormal results  are displayed) Labs Reviewed  CBC - Abnormal; Notable for the following components:      Result Value   Hemoglobin 12.9 (*)    All other components within normal limits  ETHANOL - Abnormal; Notable for the following components:   Alcohol, Ethyl (B) 226 (*)    All other components within normal limits  BASIC METABOLIC PANEL - Abnormal; Notable for the following components:   Glucose, Bld 108 (*)    BUN <5 (*)    Calcium 8.4 (*)    All other components within normal limits    EKG None  Radiology DG Chest 2 View  Result Date: 09/08/2019 CLINICAL DATA:  Chest pain and shortness of breath. Left-sided pain after a fall. EXAM: CHEST - 2 VIEW COMPARISON:  Radiograph 03/22/2016 FINDINGS: Small left pneumothorax best visualized at the apex, approximately 20%. This is best visualized at the  level of the posterior left fourth rib. No mediastinal shift. The cardiomediastinal contours are normal. The lungs are clear. Pulmonary vasculature is normal. Fracture of left posterior ninth rib, likely acute. Remote right rib fractures as seen on prior exam. IMPRESSION: Small left pneumothorax, approximately 20%. Fracture of left posterior ninth rib, may be acute. Critical Value/emergent results were called by telephone at the time of interpretation on 09/08/2019 at 9:10 pm to Dr Carmin Muskrat , who verbally acknowledged these results. Electronically Signed   By: Keith Rake M.D.   On: 09/08/2019 21:10    Procedures Procedures (including critical care time)  Medications Ordered in ED Medications  HYDROmorphone (DILAUDID) injection 0.5 mg (has no administration in time range)    ED Course  I have reviewed the triage vital signs and the nursing notes.  Pertinent labs & imaging results that were available during my care of the patient were reviewed by me and considered in my medical decision making (see chart for details).    MDM Rules/Calculators/A&P                          Patient with fall.  Has at least one rib fracture and initially 20% pneumothorax.  Dr. Darcey Nora had reviewed x-rays.  Initially thought to potentially could be followed up with x-ray at around 4 hours and possible discharge home.  However does have comorbidities of COPD.  Will get CT scan of chest and abdomen pelvis.  Potentially will require admission to the hospital.  Dr. Barry Dienes is aware of patient.  Care turned over to Dr. Leonette Monarch Final Clinical Impression(s) / ED Diagnoses Final diagnoses:  Fall, initial encounter  Closed fracture of one rib of left side, initial encounter  Traumatic pneumothorax, initial encounter    Rx / DC Orders ED Discharge Orders    None       Davonna Belling, MD 09/09/19 0010

## 2019-09-09 NOTE — Care Management Obs Status (Signed)
[place] NOTIFICATION   Patient Details  Name: ASHLEIGH ARYA MRN: [id number] Date of Birth: 1955-06-09   Medicare Observation Status Notification Given:       Ella Bodo, RN 09/09/2019, 2:54 PM

## 2019-09-09 NOTE — H&P (Addendum)
History   Marc Mcdonald is an 64 y.o. male.   Chief Complaint:  Chief Complaint  Patient presents with  . Fall  . Shortness of Breath    Pt is a 64 yo M who was brought in as a non trauma with shortness of breath and left chest pain after a fall.  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  He fell and struck his left chest and arm.  He did not strike his head.  He drinks around 12-15 beers per day and had already had most of those today.  He does not feel as short of breath as he did before.  He has a dx of COPD, but has not been on inhalers and has not had pneumonia or been on a ventilator.  He quit smoking 5 months ago.  He has h/o clavicle fx and multiple rib fractures in the past.  He denies any h/o DTs.     Past Medical History:  Diagnosis Date  . COPD (chronic obstructive pulmonary disease) ([facility])     PSH 4 groin hernia repairs.  History reviewed. No pertinent family history. Social History:  reports that he has been smoking cigarettes. He has never used smokeless tobacco. He reports current alcohol use. No history on file for drug use.  Allergies  No Known Allergies  Home Medications  None other than PRN ibuprofen.  Trauma Course   Results for orders placed or performed during the hospital encounter of 09/08/19 (from the past 48 hour(s))  CBC     Status: Abnormal   Collection Time: 09/08/19  8:17 PM  Result Value Ref Range   WBC 9.3 4.0 - 10.5 K/uL   RBC 4.26 4.22 - 5.81 MIL/uL   Hemoglobin 12.9 (L) 13.0 - 17.0 g/dL   HCT 40.7 39 - 52 %   MCV 95.5 80.0 - 100.0 fL   MCH 30.3 26.0 - 34.0 pg   MCHC 31.7 30.0 - 36.0 g/dL   RDW 13.1 11.5 - 15.5 %   Platelets 298 150 - 400 K/uL   nRBC 0.0 0.0 - 0.2 %    Comment: Performed at [facility], [place] [address], [place], [place] [address]  Ethanol     Status: Abnormal   Collection Time: 09/08/19  8:17 PM  Result Value Ref Range   Alcohol, Ethyl (B) 226 (H) <10 mg/dL    Comment: (NOTE) Lowest  detectable limit for serum alcohol is 10 mg/dL.  For medical purposes only. Performed at [facility], [place] [address], [place], [place] [address]   Basic metabolic panel     Status: Abnormal   Collection Time: 09/08/19  8:17 PM  Result Value Ref Range   Sodium 137 135 - 145 mmol/L   Potassium 3.7 3.5 - 5.1 mmol/L   Chloride 101 98 - 111 mmol/L   CO2 22 22 - 32 mmol/L   Glucose, Bld 108 (H) 70 - 99 mg/dL    Comment: Glucose reference range applies only to samples taken after fasting for at least 8 hours.   BUN <5 (L) 8 - 23 mg/dL   Creatinine, Ser 0.70 0.61 - 1.24 mg/dL   Calcium 8.4 (L) 8.9 - 10.3 mg/dL   GFR calc non Af Amer >60 >60 mL/min   GFR calc Af Amer >60 >60 mL/min   Anion gap 14 5 - 15    Comment: Performed at [place],  [place] [address]   DG Chest 2 View  Result Date: 09/08/2019 CLINICAL DATA:  Chest pain and shortness of breath. Left-sided pain after a fall. EXAM: CHEST - 2 VIEW COMPARISON:  Radiograph 03/22/2016 FINDINGS: Small left pneumothorax best visualized at the apex, approximately 20%. This is best visualized at the level of the posterior left fourth rib. No mediastinal shift. The cardiomediastinal contours are normal. The lungs are clear. Pulmonary vasculature is normal. Fracture of left posterior ninth rib, likely acute. Remote right rib fractures as seen on prior exam. IMPRESSION: Small left pneumothorax, approximately 20%. Fracture of left posterior ninth rib, may be acute. Critical Value/emergent results were called by telephone at the time of interpretation on 09/08/2019 at 9:10 pm to Dr Carmin Muskrat , who verbally acknowledged these results. Electronically Signed   By: Keith Rake M.D.   On: 09/08/2019 21:10   CT Chest W Contrast  Result Date: 09/09/2019 CLINICAL DATA:  64 year old male status post fall with left side chest pain. Left 9th rib fracture and small pneumothorax visible on chest radiographs today.  EXAM: CT CHEST, ABDOMEN, AND PELVIS WITH CONTRAST TECHNIQUE: Multidetector CT imaging of the chest, abdomen and pelvis was performed following the standard protocol during bolus administration of intravenous contrast. CONTRAST:  128mL OMNIPAQUE IOHEXOL 300 MG/ML  SOLN COMPARISON:  Chest radiographs 2043 hours today. FINDINGS: CT CHEST FINDINGS Cardiovascular: Mildly tortuous thoracic aorta appears intact. Mild Calcified aortic atherosclerosis. No cardiomegaly or pericardial effusion. Other major mediastinal vascular structures appear intact. Mediastinum/Nodes: Negative. No mediastinal hematoma or lymphadenopathy. Lungs/Pleura: Dependent retained secretions in the trachea, adherent to the anterior carina. But the major airways remain patent. There is a small left pneumothorax (series 5, image 106). No superimposed left pleural effusion. Atelectasis suspected in the left costophrenic angle, with no definite pulmonary contusion. Right apical paraseptal emphysema. Mild atelectasis and scarring at the right lung base. Musculoskeletal: There is a mix of acute and chronic rib fractures. On the right side there are chronic fractures of the posterolateral 9th and 10th ribs, along with a mild chronic fracture of the posterior right 11th rib. On the left there are acute fractures of the anterior left 4th, lateral left 6th, 7th, 8th and 9th ribs. Those fractures are nondisplaced to minimally displaced. There are numerous superimposed chronic left rib fractures, including an ununited fracture of the lateral left 11th rib. No sternal fracture. Thoracic vertebrae appear intact. Chronic fracture of the distal left clavicle suspected. Other visible shoulder osseous structures appear intact. CT ABDOMEN PELVIS FINDINGS Hepatobiliary: Hepatic steatosis. The liver and gallbladder appear intact. Pancreas: Negative. Spleen: Negative. Adrenals/Urinary Tract: Normal adrenal glands. Bilateral renal enhancement and contrast excretion is  symmetric and normal. Normal proximal ureters. Distended but otherwise unremarkable urinary bladder. No nephrolithiasis. Stomach/Bowel: Negative aside from redundant large bowel, cecum on a lax mesentery, diminutive or absent appendix. No free air or free fluid. Vascular/Lymphatic: Aortoiliac calcified atherosclerosis. Major arterial structures remain patent. Portal venous system is patent. No lymphadenopathy. Reproductive: Negative. Other: No pelvic free fluid. Musculoskeletal: Transitional lumbosacral anatomy. No acute osseous abnormality identified. IMPRESSION: 1. Persistent small left pneumothorax. No pleural effusion or pulmonary contusion identified. 2. Acute fractures of the left 4th and 6th through 9th ribs. Underlying numerous chronic bilateral rib fractures. 3. No other acute traumatic injury identified in the chest, abdomen, or pelvis. 4. Hepatic steatosis. Distended urinary bladder. Aortic Atherosclerosis (ICD10-I70.0) and Emphysema (ICD10-J43.9). Electronically Signed   By: Genevie Ann M.D.   On: 09/09/2019 00:49   CT  ABDOMEN PELVIS W CONTRAST  Result Date: 09/09/2019 CLINICAL DATA:  64 year old male status post fall with left side chest pain. Left 9th rib fracture and small pneumothorax visible on chest radiographs today. EXAM: CT CHEST, ABDOMEN, AND PELVIS WITH CONTRAST TECHNIQUE: Multidetector CT imaging of the chest, abdomen and pelvis was performed following the standard protocol during bolus administration of intravenous contrast. CONTRAST:  168mL OMNIPAQUE IOHEXOL 300 MG/ML  SOLN COMPARISON:  Chest radiographs 2043 hours today. FINDINGS: CT CHEST FINDINGS Cardiovascular: Mildly tortuous thoracic aorta appears intact. Mild Calcified aortic atherosclerosis. No cardiomegaly or pericardial effusion. Other major mediastinal vascular structures appear intact. Mediastinum/Nodes: Negative. No mediastinal hematoma or lymphadenopathy. Lungs/Pleura: Dependent retained secretions in the trachea, adherent to  the anterior carina. But the major airways remain patent. There is a small left pneumothorax (series 5, image 106). No superimposed left pleural effusion. Atelectasis suspected in the left costophrenic angle, with no definite pulmonary contusion. Right apical paraseptal emphysema. Mild atelectasis and scarring at the right lung base. Musculoskeletal: There is a mix of acute and chronic rib fractures. On the right side there are chronic fractures of the posterolateral 9th and 10th ribs, along with a mild chronic fracture of the posterior right 11th rib. On the left there are acute fractures of the anterior left 4th, lateral left 6th, 7th, 8th and 9th ribs. Those fractures are nondisplaced to minimally displaced. There are numerous superimposed chronic left rib fractures, including an ununited fracture of the lateral left 11th rib. No sternal fracture. Thoracic vertebrae appear intact. Chronic fracture of the distal left clavicle suspected. Other visible shoulder osseous structures appear intact. CT ABDOMEN PELVIS FINDINGS Hepatobiliary: Hepatic steatosis. The liver and gallbladder appear intact. Pancreas: Negative. Spleen: Negative. Adrenals/Urinary Tract: Normal adrenal glands. Bilateral renal enhancement and contrast excretion is symmetric and normal. Normal proximal ureters. Distended but otherwise unremarkable urinary bladder. No nephrolithiasis. Stomach/Bowel: Negative aside from redundant large bowel, cecum on a lax mesentery, diminutive or absent appendix. No free air or free fluid. Vascular/Lymphatic: Aortoiliac calcified atherosclerosis. Major arterial structures remain patent. Portal venous system is patent. No lymphadenopathy. Reproductive: Negative. Other: No pelvic free fluid. Musculoskeletal: Transitional lumbosacral anatomy. No acute osseous abnormality identified. IMPRESSION: 1. Persistent small left pneumothorax. No pleural effusion or pulmonary contusion identified. 2. Acute fractures of the left  4th and 6th through 9th ribs. Underlying numerous chronic bilateral rib fractures. 3. No other acute traumatic injury identified in the chest, abdomen, or pelvis. 4. Hepatic steatosis. Distended urinary bladder. Aortic Atherosclerosis (ICD10-I70.0) and Emphysema (ICD10-J43.9). Electronically Signed   By: Genevie Ann M.D.   On: 09/09/2019 00:49    Review of Systems  Constitutional: Negative.   HENT: Negative.   Eyes: Negative.   Respiratory: Positive for shortness of breath.   Cardiovascular: Positive for chest pain (left).  Gastrointestinal: Negative.   Endocrine: Negative.   Genitourinary: Negative.   Musculoskeletal: Negative.   Skin:       Skin tears on left arm.  Allergic/Immunologic: Negative.   Neurological: Negative.   Hematological: Negative.   Psychiatric/Behavioral: Negative.     Blood pressure 127/79, pulse (!) 103, temperature 97.9 F (36.6 C), temperature source Oral, resp. rate (!) 23, height 5\' 11"  (1.803 m), weight 63.5 kg, SpO2 93 %. Physical Exam Constitutional:      General: He is not in acute distress.    Appearance: He is well-developed. He is not ill-appearing, toxic-appearing or diaphoretic.  HENT:     Head: Normocephalic and atraumatic.  Mouth/Throat:     Mouth: Mucous membranes are moist.     Pharynx: Oropharynx is clear.  Eyes:     Extraocular Movements: Extraocular movements intact.     Pupils: Pupils are equal, round, and reactive to light.  Neck:     Thyroid: No thyromegaly.     Vascular: No hepatojugular reflux or JVD.     Trachea: No tracheal deviation.  Cardiovascular:     Rate and Rhythm: Normal rate and regular rhythm.     Pulses: Normal pulses.  Pulmonary:     Effort: Pulmonary effort is normal. No tachypnea, bradypnea or respiratory distress.     Breath sounds: Normal breath sounds.  Abdominal:     General: Abdomen is flat. Bowel sounds are normal. There is no distension.     Palpations: Abdomen is soft. There is no mass.      Tenderness: There is no abdominal tenderness. There is no guarding or rebound.     Comments: No HSM  Musculoskeletal:        General: No swelling, tenderness, deformity or signs of injury. Normal range of motion.     Cervical back: Normal range of motion and neck supple.  Lymphadenopathy:     Cervical: No cervical adenopathy.  Skin:    General: Skin is warm.     Capillary Refill: Capillary refill takes 2 to 3 seconds.     Comments: Abrasions dressed.  Neurological:     Mental Status: He is alert and oriented to person, place, and time.     Comments: Hard of hearing.  Psychiatric:        Mood and Affect: Mood normal.        Behavior: Behavior normal.        Thought Content: Thought content normal.        Judgment: Judgment normal.     Assessment/Plan Fall Left PTX Left rib fractures 4 and 6-9 COPD Heavy EtOH use with active intoxication  Pulmonary toilet Pain control Oxygen if needed. CIWA protocol Will need follow up CXR Anticipate stay of 2-3 days.  Stark Klein 09/09/2019, 1:16 AM   Procedures

## 2019-09-09 NOTE — Discharge Summary (Signed)
    Patient ID: Marc Mcdonald [id number] [birth year] 64 y.o.  Admit date: 09/08/2019 Discharge date: 09/09/2019  Admitting Diagnosis: Fall Left PTX Left rib fractures 4 and 6-9 COPD Heavy EtOH use with active intoxication  Discharge Diagnosis Fall Left PTX Left rib fractures 4 and 6-9 COPD Heavy EtOH use   Consultants TCTS  Reason for Admission: Pt is a 64 yo M who was brought in as a non trauma with shortness of breath and left chest pain after a fall.  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  He fell and struck his left chest and arm.  He did not strike his head.  He drinks around 12-15 beers per day and had already had most of those today.  He does not feel as short of breath as he did before.  He has a dx of COPD, but has not been on inhalers and has not had pneumonia or been on a ventilator.  He quit smoking 5 months ago.  He has h/o clavicle fx and multiple rib fractures in the past.  He denies any h/o DTs.    Procedures None  Hospital Course:  Patient was admitted to the trauma service for observation. TCTS was consulted by EDP for PTX. Follow up CXR the following morning stable. They arranged follow up in their office for a repeat cxr and follow up next week. The patient worked with PT who recommended no follow up. Patient lives with his son at home who he states can help him. On 09/09/2019, the patient was voiding well, tolerating diet, ambulating well, pain well controlled, vital signs stable, on RA, and felt stable for discharge home. Follow up information as noted below.   Allergies as of 09/09/2019   No Known Allergies     Medication List    STOP taking these medications   morphine 15 MG tablet Commonly known as: MSIR     TAKE these medications   ibuprofen 200 MG tablet Commonly known as: ADVIL Take 200 mg by mouth every 6 (six) hours as needed for moderate pain.   multivitamin with minerals Tabs tablet Take 1 tablet by mouth  daily. Start taking on: September 10, 2019   oxyCODONE 5 MG immediate release tablet Commonly known as: Oxy IR/ROXICODONE Take 1 tablet (5 mg total) by mouth every 6 (six) hours as needed for breakthrough pain.         Follow-up Information    [facility]. Call.   Why: As needed with any questions or concerns.  Contact information: [place] [id number] [phone]       Ivin Poot, MD. Call in 1 day(s).   Specialty: Cardiothoracic Surgery Why: To confirm your appointment time for next week. They are seeing you to follow up your pneumothorax.  Contact information: [address] [place] [address] [phone]               Signed: Alferd Apa, [facility] 09/09/2019, 1:36 PM Please see Amion for pager number during day hours 7:00am-4:30pm

## 2019-09-09 NOTE — Care Management CC44 (Signed)
Condition Code 44 Documentation Completed  Patient Details  Name: JADIE COMAS MRN: [id number] Date of Birth: 29-Nov-1955   Condition Code 44 given:    Patient signature on Condition Code 44 notice:    Documentation of 2 MD's agreement:    Code 44 added to claim:       Ella Bodo, RN 09/09/2019, 2:54 PM

## 2019-09-09 NOTE — Consult Note (Addendum)
[address]       [place] [address]             [phone]        Marc Mcdonald [place] Medical Record #[id number] Date of Birth: 11-Oct-1955  Referring: No ref. provider found Primary Care: Patient, No Pcp Per Primary Cardiologist:No primary care provider on file.  Chief Complaint:    Chief Complaint  Patient presents with   Fall   Shortness of Breath    History of Present Illness:      This is a 64 year old patient with a past medical history of smoking (he quit 5 weeks ago) and CODP who presented to [name] after a fall while intoxicated. He did have a history of a clavicle fracture and multiple rib fractures before this particular fall. On 7/15 a CXR was obtained which showed approximately a 20% pneumothorax. CT scan of the chest was then performed showing a persistent small left pneumothorax and no pleural effusion or pulmonary contusion. He did have some acute fractures of the left 4th and 6th through 9th ribs. There were other chronic bilateral rib fractures noted. We are consulted for evaluation and to arrange outpatient follow-up as the patient will hopefully be discharged later today.   Most of this history was taken from the chart since the patient remained asleep for most of my interview despite multiple sternal rubs.    Current Activity/ Functional Status: Patient was independent with mobility/ambulation, transfers, ADL's, IADL's.   Zubrod Score: At the time of surgery this patient's most appropriate activity status/level should be described as: []     0    Normal activity, no symptoms []     1    Restricted in physical strenuous activity but ambulatory, able to do out light work [x]     2    Ambulatory and capable of self care, unable to do work activities, up and about                 more than 50%  Of the time                            []     3    Only limited self care, in bed greater than 50% of waking hours []     4    Completely  disabled, no self care, confined to bed or chair []     5    Moribund  Past Medical History:  Diagnosis Date   COPD (chronic obstructive pulmonary disease) ([place])     History reviewed. No pertinent surgical history.  Social History   Tobacco Use  Smoking Status Current Every Day Smoker   Types: Cigarettes  Smokeless Tobacco Never Used    Social History   Substance and Sexual Activity  Alcohol Use Yes   Comment: daily     No Known Allergies  Current Facility-Administered Medications  Medication Dose Route Frequency Provider Last Rate Last Admin   0.45 % NaCl with KCl 20 mEq / L infusion   Intravenous Continuous Stark Klein, MD 75 mL/hr at 09/09/19 0256 New Bag at 09/09/19 0256   acetaminophen (TYLENOL) tablet 1,000 mg  1,000 mg Oral TID Alferd Apa M, PA-C       docusate sodium (COLACE) capsule 100 mg  100 mg Oral BID Stark Klein, MD   100 mg at 09/09/19 0935   enoxaparin (LOVENOX)  injection 40 mg  40 mg Subcutaneous Q24H Stark Klein, MD   40 mg at 86/76/72 0947   folic acid (FOLVITE) tablet 1 mg  1 mg Oral Daily Stark Klein, MD   1 mg at 09/09/19 0935   ipratropium-albuterol (DUONEB) 0.5-2.5 (3) MG/3ML nebulizer solution 3 mL  3 mL Nebulization Q6H PRN Maczis, Barth Kirks, PA-C       LORazepam (ATIVAN) tablet 1-4 mg  1-4 mg Oral Q1H PRN Stark Klein, MD       Or   LORazepam (ATIVAN) injection 1-4 mg  1-4 mg Intravenous Q1H PRN Stark Klein, MD   4 mg at 09/09/19 1043   LORazepam (ATIVAN) tablet 0-4 mg  0-4 mg Oral Q6H Stark Klein, MD   1 mg at 09/09/19 0935   Followed by   Derrill Memo ON 09/11/2019] LORazepam (ATIVAN) tablet 0-4 mg  0-4 mg Oral Q12H Stark Klein, MD       morphine 2 MG/ML injection 2 mg  2 mg Intravenous Q3H PRN Maczis, Barth Kirks, PA-C       multivitamin with minerals tablet 1 tablet  1 tablet Oral Daily Stark Klein, MD   1 tablet at 09/09/19 0935   ondansetron (ZOFRAN-ODT) disintegrating tablet 4 mg  4 mg Oral Q6H PRN Stark Klein, MD       Or    ondansetron (ZOFRAN) injection 4 mg  4 mg Intravenous Q6H PRN Stark Klein, MD       oxyCODONE (Oxy IR/ROXICODONE) immediate release tablet 5-10 mg  5-10 mg Oral Q4H PRN Jillyn Ledger, PA-C   10 mg at 09/09/19 0935   spiritus frumenti (ethyl alcohol) solution 1 each  1 each Oral PRN Maczis, Barth Kirks, PA-C       thiamine tablet 100 mg  100 mg Oral Daily Stark Klein, MD   100 mg at 09/09/19 0935   Or   thiamine (B-1) injection 100 mg  100 mg Intravenous Daily Stark Klein, MD        Medications Prior to Admission  Medication Sig Dispense Refill Last Dose   ibuprofen (ADVIL) 200 MG tablet Take 200 mg by mouth every 6 (six) hours as needed for moderate pain.   Past Week at Unknown time   morphine (MSIR) 15 MG tablet Take 1 tablet (15 mg total) by mouth every 4 (four) hours as needed for severe pain. (Patient not taking: Reported on 09/08/2019) 7 tablet 0 Not Taking at Unknown time    History reviewed. No pertinent family history.   Review of Systems:   Review of Systems  Respiratory: Positive for shortness of breath.   Cardiovascular: Positive for chest pain (left side).   ROS taken from the chart due to patient's inability to answer   Pertinent items are noted in HPI.    Physical Exam: BP (!) 158/84 (BP Location: Right Arm)   Pulse 100   Temp 97.9 F (36.6 C) (Oral)   Resp 20   Ht 5\' 11"  (1.803 m)   Wt 63.5 kg   SpO2 97%   BMI 19.53 kg/m    General appearance: cooperative, sleepy and in no distress Cardio: regular rate and rhythm, S1, S2 normal, no murmur, click, rub or gallop Pulm: CTA, bilaterally and in all fields GI: soft, non-tender; bowel sounds normal; no masses,  no organomegaly Extremities: extremities normal, atraumatic, no cyanosis or edema   Diagnostic Studies & Laboratory data:     Recent Radiology Findings:   DG Chest 2 View  Result  Date: 09/08/2019 CLINICAL DATA:  Chest pain and shortness of breath. Left-sided pain after a fall. EXAM: CHEST -  2 VIEW COMPARISON:  Radiograph 03/22/2016 FINDINGS: Small left pneumothorax best visualized at the apex, approximately 20%. This is best visualized at the level of the posterior left fourth rib. No mediastinal shift. The cardiomediastinal contours are normal. The lungs are clear. Pulmonary vasculature is normal. Fracture of left posterior ninth rib, likely acute. Remote right rib fractures as seen on prior exam. IMPRESSION: Small left pneumothorax, approximately 20%. Fracture of left posterior ninth rib, may be acute. Critical Value/emergent results were called by telephone at the time of interpretation on 09/08/2019 at 9:10 pm to Dr Carmin Muskrat , who verbally acknowledged these results. Electronically Signed   By: Keith Rake M.D.   On: 09/08/2019 21:10   CT Chest W Contrast  Result Date: 09/09/2019 CLINICAL DATA:  64 year old male status post fall with left side chest pain. Left 9th rib fracture and small pneumothorax visible on chest radiographs today. EXAM: CT CHEST, ABDOMEN, AND PELVIS WITH CONTRAST TECHNIQUE: Multidetector CT imaging of the chest, abdomen and pelvis was performed following the standard protocol during bolus administration of intravenous contrast. CONTRAST:  170mL OMNIPAQUE IOHEXOL 300 MG/ML  SOLN COMPARISON:  Chest radiographs 2043 hours today. FINDINGS: CT CHEST FINDINGS Cardiovascular: Mildly tortuous thoracic aorta appears intact. Mild Calcified aortic atherosclerosis. No cardiomegaly or pericardial effusion. Other major mediastinal vascular structures appear intact. Mediastinum/Nodes: Negative. No mediastinal hematoma or lymphadenopathy. Lungs/Pleura: Dependent retained secretions in the trachea, adherent to the anterior carina. But the major airways remain patent. There is a small left pneumothorax (series 5, image 106). No superimposed left pleural effusion. Atelectasis suspected in the left costophrenic angle, with no definite pulmonary contusion. Right apical paraseptal  emphysema. Mild atelectasis and scarring at the right lung base. Musculoskeletal: There is a mix of acute and chronic rib fractures. On the right side there are chronic fractures of the posterolateral 9th and 10th ribs, along with a mild chronic fracture of the posterior right 11th rib. On the left there are acute fractures of the anterior left 4th, lateral left 6th, 7th, 8th and 9th ribs. Those fractures are nondisplaced to minimally displaced. There are numerous superimposed chronic left rib fractures, including an ununited fracture of the lateral left 11th rib. No sternal fracture. Thoracic vertebrae appear intact. Chronic fracture of the distal left clavicle suspected. Other visible shoulder osseous structures appear intact. CT ABDOMEN PELVIS FINDINGS Hepatobiliary: Hepatic steatosis. The liver and gallbladder appear intact. Pancreas: Negative. Spleen: Negative. Adrenals/Urinary Tract: Normal adrenal glands. Bilateral renal enhancement and contrast excretion is symmetric and normal. Normal proximal ureters. Distended but otherwise unremarkable urinary bladder. No nephrolithiasis. Stomach/Bowel: Negative aside from redundant large bowel, cecum on a lax mesentery, diminutive or absent appendix. No free air or free fluid. Vascular/Lymphatic: Aortoiliac calcified atherosclerosis. Major arterial structures remain patent. Portal venous system is patent. No lymphadenopathy. Reproductive: Negative. Other: No pelvic free fluid. Musculoskeletal: Transitional lumbosacral anatomy. No acute osseous abnormality identified. IMPRESSION: 1. Persistent small left pneumothorax. No pleural effusion or pulmonary contusion identified. 2. Acute fractures of the left 4th and 6th through 9th ribs. Underlying numerous chronic bilateral rib fractures. 3. No other acute traumatic injury identified in the chest, abdomen, or pelvis. 4. Hepatic steatosis. Distended urinary bladder. Aortic Atherosclerosis (ICD10-I70.0) and Emphysema  (ICD10-J43.9). Electronically Signed   By: Genevie Ann M.D.   On: 09/09/2019 00:49   CT ABDOMEN PELVIS W CONTRAST  Result Date: 09/09/2019 CLINICAL  DATA:  63 year old male status post fall with left side chest pain. Left 9th rib fracture and small pneumothorax visible on chest radiographs today. EXAM: CT CHEST, ABDOMEN, AND PELVIS WITH CONTRAST TECHNIQUE: Multidetector CT imaging of the chest, abdomen and pelvis was performed following the standard protocol during bolus administration of intravenous contrast. CONTRAST:  151mL OMNIPAQUE IOHEXOL 300 MG/ML  SOLN COMPARISON:  Chest radiographs 2043 hours today. FINDINGS: CT CHEST FINDINGS Cardiovascular: Mildly tortuous thoracic aorta appears intact. Mild Calcified aortic atherosclerosis. No cardiomegaly or pericardial effusion. Other major mediastinal vascular structures appear intact. Mediastinum/Nodes: Negative. No mediastinal hematoma or lymphadenopathy. Lungs/Pleura: Dependent retained secretions in the trachea, adherent to the anterior carina. But the major airways remain patent. There is a small left pneumothorax (series 5, image 106). No superimposed left pleural effusion. Atelectasis suspected in the left costophrenic angle, with no definite pulmonary contusion. Right apical paraseptal emphysema. Mild atelectasis and scarring at the right lung base. Musculoskeletal: There is a mix of acute and chronic rib fractures. On the right side there are chronic fractures of the posterolateral 9th and 10th ribs, along with a mild chronic fracture of the posterior right 11th rib. On the left there are acute fractures of the anterior left 4th, lateral left 6th, 7th, 8th and 9th ribs. Those fractures are nondisplaced to minimally displaced. There are numerous superimposed chronic left rib fractures, including an ununited fracture of the lateral left 11th rib. No sternal fracture. Thoracic vertebrae appear intact. Chronic fracture of the distal left clavicle suspected. Other  visible shoulder osseous structures appear intact. CT ABDOMEN PELVIS FINDINGS Hepatobiliary: Hepatic steatosis. The liver and gallbladder appear intact. Pancreas: Negative. Spleen: Negative. Adrenals/Urinary Tract: Normal adrenal glands. Bilateral renal enhancement and contrast excretion is symmetric and normal. Normal proximal ureters. Distended but otherwise unremarkable urinary bladder. No nephrolithiasis. Stomach/Bowel: Negative aside from redundant large bowel, cecum on a lax mesentery, diminutive or absent appendix. No free air or free fluid. Vascular/Lymphatic: Aortoiliac calcified atherosclerosis. Major arterial structures remain patent. Portal venous system is patent. No lymphadenopathy. Reproductive: Negative. Other: No pelvic free fluid. Musculoskeletal: Transitional lumbosacral anatomy. No acute osseous abnormality identified. IMPRESSION: 1. Persistent small left pneumothorax. No pleural effusion or pulmonary contusion identified. 2. Acute fractures of the left 4th and 6th through 9th ribs. Underlying numerous chronic bilateral rib fractures. 3. No other acute traumatic injury identified in the chest, abdomen, or pelvis. 4. Hepatic steatosis. Distended urinary bladder. Aortic Atherosclerosis (ICD10-I70.0) and Emphysema (ICD10-J43.9). Electronically Signed   By: Genevie Ann M.D.   On: 09/09/2019 00:49   DG CHEST PORT 1 VIEW  Result Date: 09/09/2019 CLINICAL DATA:  Pneumothorax follow-up EXAM: PORTABLE CHEST 1 VIEW COMPARISON:  Yesterday FINDINGS: A small left apical pneumothorax is unchanged. No edema or consolidation. Normal heart size and mediastinal contours. IMPRESSION: Unchanged small left apical pneumothorax. Electronically Signed   By: Monte Fantasia M.D.   On: 09/09/2019 09:27     I have independently reviewed the above radiologic studies and discussed with the patient   Recent Lab Findings: Lab Results  Component Value Date   WBC 12.8 (H) 09/09/2019   HGB 12.6 (L) 09/09/2019   HCT 39.3  09/09/2019   PLT 251 09/09/2019   GLUCOSE 117 (H) 09/09/2019   ALT 26 09/09/2019   AST 50 (H) 09/09/2019   NA 134 (L) 09/09/2019   K 4.1 09/09/2019   CL 100 09/09/2019   CREATININE 0.63 09/09/2019   BUN <5 (L) 09/09/2019   CO2 18 (L) 09/09/2019  Assessment / Plan:       1. Left small stable apical pneumothorax. Patient tolerating room air with good oxygen saturation.  2. Alcohol abuse- patient drinks 12-15 beers a day per report and there was a natty light present in the bed with the patient. Also on CIWA with Ativan ordered. No agitation currently.   Plan: Follow-up next week in the clinic with Dr. Prescott Gum for a chest xray. No intervention at this time needed.   I  spent 10 minutes counseling the patient face to face.   Nicholes Rough, PA-C 09/09/2019 11:27 AM  Radiographic studies reviewed and patient d/w Ms Lindell Spar for coordination of care. Small apical L pneumothorax which has remained stable overnight, Will follow up next week in office with CXR  Ivin Poot MD

## 2019-09-09 NOTE — Progress Notes (Signed)
Subjective: CC: Rib pain Patient reports left sided ribcage pain. No other areas of pain. Has not eaten or gotten out of bed yet. Lives at home with is son, Spiros Greenfeld. Reports he is retired. No other complaints. Notes he quit smoking 5 weeks ago.   Objective: Vital signs in last 24 hours: Temp:  [97.8 F (36.6 C)-98.1 F (36.7 C)] 97.9 F (36.6 C) (07/16 0243) Pulse Rate:  [82-103] 100 (07/16 0243) Resp:  [16-23] 20 (07/16 0216) BP: (122-158)/(79-90) 158/84 (07/16 0243) SpO2:  [92 %-96 %] 96 % (07/16 0243) Weight:  [63.5 kg] 63.5 kg (07/15 2130)    Intake/Output from previous day: 07/15 0701 - 07/16 0700 In: 376.6 [P.O.:120; I.V.:256.6] Out: -  Intake/Output this shift: No intake/output data recorded.  PE: Gen:  Alert, NAD, pleasant HEENT: EOM's intact, pupils equal and round Card:  RRR Pulm:  Diffuse expiratory and inspiratory wheezing, no decreased breath sounds, rhonchi or rales. Normal rate and effort  Abd: Soft, NT/ND, +BS Ext:  Active ROM of b/l UE's and lower extremities without pain or limitation. No erythema, edema, or tenderness BUE/BLE  Psych: A&Ox3  Skin: no rashes noted, warm and dry   Lab Results:  Recent Labs    09/08/19 2017 09/09/19 0207  WBC 9.3 12.8*  HGB 12.9* 12.6*  HCT 40.7 39.3  PLT 298 251   BMET Recent Labs    09/08/19 2017 09/09/19 0207  NA 137 134*  K 3.7 4.1  CL 101 100  CO2 22 18*  GLUCOSE 108* 117*  BUN <5* <5*  CREATININE 0.70 0.63  CALCIUM 8.4* 8.3*   PT/INR No results for input(s): LABPROT, INR in the last 72 hours. CMP     Component Value Date/Time   NA 134 (L) 09/09/2019 0207   K 4.1 09/09/2019 0207   CL 100 09/09/2019 0207   CO2 18 (L) 09/09/2019 0207   GLUCOSE 117 (H) 09/09/2019 0207   BUN <5 (L) 09/09/2019 0207   CREATININE 0.63 09/09/2019 0207   CALCIUM 8.3 (L) 09/09/2019 0207   PROT 6.8 09/09/2019 0207   ALBUMIN 3.0 (L) 09/09/2019 0207   AST 50 (H) 09/09/2019 0207   ALT 26 09/09/2019  0207   ALKPHOS 110 09/09/2019 0207   BILITOT 1.1 09/09/2019 0207   GFRNONAA >60 09/09/2019 0207   GFRAA >60 09/09/2019 0207   Lipase  No results found for: LIPASE     Studies/Results: DG Chest 2 View  Result Date: 09/08/2019 CLINICAL DATA:  Chest pain and shortness of breath. Left-sided pain after a fall. EXAM: CHEST - 2 VIEW COMPARISON:  Radiograph 03/22/2016 FINDINGS: Small left pneumothorax best visualized at the apex, approximately 20%. This is best visualized at the level of the posterior left fourth rib. No mediastinal shift. The cardiomediastinal contours are normal. The lungs are clear. Pulmonary vasculature is normal. Fracture of left posterior ninth rib, likely acute. Remote right rib fractures as seen on prior exam. IMPRESSION: Small left pneumothorax, approximately 20%. Fracture of left posterior ninth rib, may be acute. Critical Value/emergent results were called by telephone at the time of interpretation on 09/08/2019 at 9:10 pm to Dr Carmin Muskrat , who verbally acknowledged these results. Electronically Signed   By: Keith Rake M.D.   On: 09/08/2019 21:10   CT Chest W Contrast  Result Date: 09/09/2019 CLINICAL DATA:  64 year old male status post fall with left side chest pain. Left 9th rib fracture and small pneumothorax visible on  chest radiographs today. EXAM: CT CHEST, ABDOMEN, AND PELVIS WITH CONTRAST TECHNIQUE: Multidetector CT imaging of the chest, abdomen and pelvis was performed following the standard protocol during bolus administration of intravenous contrast. CONTRAST:  115mL OMNIPAQUE IOHEXOL 300 MG/ML  SOLN COMPARISON:  Chest radiographs 2043 hours today. FINDINGS: CT CHEST FINDINGS Cardiovascular: Mildly tortuous thoracic aorta appears intact. Mild Calcified aortic atherosclerosis. No cardiomegaly or pericardial effusion. Other major mediastinal vascular structures appear intact. Mediastinum/Nodes: Negative. No mediastinal hematoma or lymphadenopathy.  Lungs/Pleura: Dependent retained secretions in the trachea, adherent to the anterior carina. But the major airways remain patent. There is a small left pneumothorax (series 5, image 106). No superimposed left pleural effusion. Atelectasis suspected in the left costophrenic angle, with no definite pulmonary contusion. Right apical paraseptal emphysema. Mild atelectasis and scarring at the right lung base. Musculoskeletal: There is a mix of acute and chronic rib fractures. On the right side there are chronic fractures of the posterolateral 9th and 10th ribs, along with a mild chronic fracture of the posterior right 11th rib. On the left there are acute fractures of the anterior left 4th, lateral left 6th, 7th, 8th and 9th ribs. Those fractures are nondisplaced to minimally displaced. There are numerous superimposed chronic left rib fractures, including an ununited fracture of the lateral left 11th rib. No sternal fracture. Thoracic vertebrae appear intact. Chronic fracture of the distal left clavicle suspected. Other visible shoulder osseous structures appear intact. CT ABDOMEN PELVIS FINDINGS Hepatobiliary: Hepatic steatosis. The liver and gallbladder appear intact. Pancreas: Negative. Spleen: Negative. Adrenals/Urinary Tract: Normal adrenal glands. Bilateral renal enhancement and contrast excretion is symmetric and normal. Normal proximal ureters. Distended but otherwise unremarkable urinary bladder. No nephrolithiasis. Stomach/Bowel: Negative aside from redundant large bowel, cecum on a lax mesentery, diminutive or absent appendix. No free air or free fluid. Vascular/Lymphatic: Aortoiliac calcified atherosclerosis. Major arterial structures remain patent. Portal venous system is patent. No lymphadenopathy. Reproductive: Negative. Other: No pelvic free fluid. Musculoskeletal: Transitional lumbosacral anatomy. No acute osseous abnormality identified. IMPRESSION: 1. Persistent small left pneumothorax. No pleural  effusion or pulmonary contusion identified. 2. Acute fractures of the left 4th and 6th through 9th ribs. Underlying numerous chronic bilateral rib fractures. 3. No other acute traumatic injury identified in the chest, abdomen, or pelvis. 4. Hepatic steatosis. Distended urinary bladder. Aortic Atherosclerosis (ICD10-I70.0) and Emphysema (ICD10-J43.9). Electronically Signed   By: Genevie Ann M.D.   On: 09/09/2019 00:49   CT ABDOMEN PELVIS W CONTRAST  Result Date: 09/09/2019 CLINICAL DATA:  64 year old male status post fall with left side chest pain. Left 9th rib fracture and small pneumothorax visible on chest radiographs today. EXAM: CT CHEST, ABDOMEN, AND PELVIS WITH CONTRAST TECHNIQUE: Multidetector CT imaging of the chest, abdomen and pelvis was performed following the standard protocol during bolus administration of intravenous contrast. CONTRAST:  176mL OMNIPAQUE IOHEXOL 300 MG/ML  SOLN COMPARISON:  Chest radiographs 2043 hours today. FINDINGS: CT CHEST FINDINGS Cardiovascular: Mildly tortuous thoracic aorta appears intact. Mild Calcified aortic atherosclerosis. No cardiomegaly or pericardial effusion. Other major mediastinal vascular structures appear intact. Mediastinum/Nodes: Negative. No mediastinal hematoma or lymphadenopathy. Lungs/Pleura: Dependent retained secretions in the trachea, adherent to the anterior carina. But the major airways remain patent. There is a small left pneumothorax (series 5, image 106). No superimposed left pleural effusion. Atelectasis suspected in the left costophrenic angle, with no definite pulmonary contusion. Right apical paraseptal emphysema. Mild atelectasis and scarring at the right lung base. Musculoskeletal: There is a mix of acute and chronic  rib fractures. On the right side there are chronic fractures of the posterolateral 9th and 10th ribs, along with a mild chronic fracture of the posterior right 11th rib. On the left there are acute fractures of the anterior left  4th, lateral left 6th, 7th, 8th and 9th ribs. Those fractures are nondisplaced to minimally displaced. There are numerous superimposed chronic left rib fractures, including an ununited fracture of the lateral left 11th rib. No sternal fracture. Thoracic vertebrae appear intact. Chronic fracture of the distal left clavicle suspected. Other visible shoulder osseous structures appear intact. CT ABDOMEN PELVIS FINDINGS Hepatobiliary: Hepatic steatosis. The liver and gallbladder appear intact. Pancreas: Negative. Spleen: Negative. Adrenals/Urinary Tract: Normal adrenal glands. Bilateral renal enhancement and contrast excretion is symmetric and normal. Normal proximal ureters. Distended but otherwise unremarkable urinary bladder. No nephrolithiasis. Stomach/Bowel: Negative aside from redundant large bowel, cecum on a lax mesentery, diminutive or absent appendix. No free air or free fluid. Vascular/Lymphatic: Aortoiliac calcified atherosclerosis. Major arterial structures remain patent. Portal venous system is patent. No lymphadenopathy. Reproductive: Negative. Other: No pelvic free fluid. Musculoskeletal: Transitional lumbosacral anatomy. No acute osseous abnormality identified. IMPRESSION: 1. Persistent small left pneumothorax. No pleural effusion or pulmonary contusion identified. 2. Acute fractures of the left 4th and 6th through 9th ribs. Underlying numerous chronic bilateral rib fractures. 3. No other acute traumatic injury identified in the chest, abdomen, or pelvis. 4. Hepatic steatosis. Distended urinary bladder. Aortic Atherosclerosis (ICD10-I70.0) and Emphysema (ICD10-J43.9). Electronically Signed   By: Genevie Ann M.D.   On: 09/09/2019 00:49    Anti-infectives: Anti-infectives (From admission, onward)   None       Assessment/Plan Fall Left PTX - Repeat CXR this AM Left rib fractures 4 and 6-9 - Multimodal pain control, pulm toilet, PT COPD - Duoneb now. PRN nebs. Quit smoking 5 weeks ago.  Heavy EtOH  use - CIWA, beer FEN - Reg, IVF VTE - SCDs, Lovenox ID - None Dispo - Repeat CXR this AM. PT. Pain control. Possible d/c later this afternoon.     LOS: 0 days    Jillyn Ledger , [facility] 09/09/2019, 7:38 AM Please see Amion for pager number during day hours 7:00am-4:30pm

## 2019-09-09 NOTE — Evaluation (Addendum)
Physical Therapy Evaluation Patient Details Name: Marc Mcdonald MRN: [id number] DOB: Feb 11, 1956 Today's Date: 09/09/2019   History of Present Illness  Pt is a 64 yo M who was brought to ED with shortness of breath and left chest pain after a fall.  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  He fell and struck his left chest and arm.  He did not strike his head.  He drinks around 12-15 beers per day.. Pt with L rib fx 4, 6-9 and small L PTX.  Pt with PMH including COPD and multiple rib fractures.  Clinical Impression  Pt admitted with above diagnosis. Pt was able to transfer and ambulate with supervision to min guard for safety.  Pt demonstrating wide BOS and mild unsteadiness with gait, but no LOB. Additionally, pt beginning to get tremors (waiting on beer from pharmacy).  Pt does have hx of falls, but reports most are due to alcohol use.  Pt was educated on transfer techniques, frequent mobility, and pulmonary hygiene post rib fx in order to prevent PNE.  Pt  Will benefit from acute PT while hospitalized but appears to be near baseline function.  No DME needs as falls mostly due to ETOH and AD would not prevent falls. Pt currently with functional limitations due to the deficits listed below (see PT Problem List). Pt will benefit from skilled PT to increase their independence and safety with mobility to allow discharge to the venue listed below.       Follow Up Recommendations No PT follow up    Equipment Recommendations  None recommended by PT    Recommendations for Other Services       Precautions / Restrictions Precautions Precautions: Fall      Mobility  Bed Mobility Overal bed mobility: Needs Assistance Bed Mobility: Supine to Sit;Sit to Supine     Supine to sit: Supervision;HOB elevated Sit to supine: Supervision;HOB elevated      Transfers Overall transfer level: Needs assistance Equipment used: None Transfers: Sit to/from Stand Sit to Stand: Min  guard         General transfer comment: for safety  Ambulation/Gait Ambulation/Gait assistance: Min guard Gait Distance (Feet): 200 Feet Assistive device: None Gait Pattern/deviations: Step-through pattern;Wide base of support Gait velocity: decreased   General Gait Details: mild unsteadiness but no LOB; walks with toe out and wide BOS - pt states "I walk like a duck all of the time now."  Stairs            Wheelchair Mobility    Modified Rankin (Stroke Patients Only)       Balance Overall balance assessment: Needs assistance Sitting-balance support: No upper extremity supported;Feet supported Sitting balance-Leahy Scale: Good     Standing balance support: No upper extremity supported Standing balance-Leahy Scale: Fair Standing balance comment: Pt ambulated without UE support but did demonstrate some unsteadiness                             Pertinent Vitals/Pain Pain Assessment: 0-10 Pain Score: 5  Pain Location: L ribs Pain Descriptors / Indicators: Sore;Discomfort    Home Living Family/patient expects to be discharged to:: Private residence Living Arrangements: Children Available Help at Discharge: Family;Available PRN/intermittently (son works during day) Type of Home: House Home Access: Stairs to enter Entrance Stairs-Rails: None Entrance Stairs-Number of Steps: 2 (pt reports has falling on steps before) Home Layout: One level Home Equipment: None  Prior Function Level of Independence: Needs assistance   Gait / Transfers Assistance Needed: Does not use AD; can ambulate community distances;  Reports several falls (on steps, in yard) reports most falls from alcohol use  ADL's / Homemaking Assistance Needed: Independent with ADLs, IADLs, and can even mow yard at times; Does not drive.  Comments: Reports lays around most of the day and watches TV     Hand Dominance        Extremity/Trunk Assessment   Upper Extremity  Assessment Upper Extremity Assessment: Overall WFL for tasks assessed    Lower Extremity Assessment Lower Extremity Assessment: Overall WFL for tasks assessed    Cervical / Trunk Assessment Cervical / Trunk Assessment: Normal  Communication   Communication: No difficulties  Cognition Arousal/Alertness: Awake/alert Behavior During Therapy: WFL for tasks assessed/performed Overall Cognitive Status: Within Functional Limits for tasks assessed                                        General Comments General comments (skin integrity, edema, etc.):  VSS on RA.  Pt educated on deep breaths and frequent mobility to prevent PNE post rib fx. Also educated on transfer techniques and sleeping in recliner if needed for rib pain.  Pt beginning to get tremors -reports has normally had 5 beers by now - RN waiting on beer from pharmacy for the pt.    Exercises     Assessment/Plan    PT Assessment Patient needs continued PT services  PT Problem List Decreased strength;Decreased mobility;Decreased safety awareness;Decreased coordination;Decreased knowledge of precautions;Decreased activity tolerance;Cardiopulmonary status limiting activity;Decreased balance;Decreased knowledge of use of DME;Pain       PT Treatment Interventions DME instruction;Therapeutic activities;Gait training;Therapeutic exercise;Patient/family education;Balance training;Stair training;Functional mobility training    PT Goals (Current goals can be found in the Care Plan section)  Acute Rehab PT Goals Patient Stated Goal: decrease pain PT Goal Formulation: With patient Time For Goal Achievement: 09/23/19 Potential to Achieve Goals: Fair    Frequency Min 3X/week   Barriers to discharge        Co-evaluation               AM-PAC PT "6 Clicks" Mobility  Outcome Measure Help needed turning from your back to your side while in a flat bed without using bedrails?: None Help needed moving from lying  on your back to sitting on the side of a flat bed without using bedrails?: None Help needed moving to and from a bed to a chair (including a wheelchair)?: None Help needed standing up from a chair using your arms (e.g., wheelchair or bedside chair)?: None Help needed to walk in hospital room?: None Help needed climbing 3-5 steps with a railing? : A Little 6 Click Score: 23    End of Session Equipment Utilized During Treatment: Gait belt Activity Tolerance: Patient tolerated treatment well Patient left: in bed;with call bell/phone within reach;with bed alarm set Nurse Communication: Mobility status PT Visit Diagnosis: Unsteadiness on feet (R26.81);History of falling (Z91.81);Pain Pain - Right/Left: Left Pain - part of body:  (ribs)    Time: 1010-1036 PT Time Calculation (min) (ACUTE ONLY): 26 min   Charges:   PT Evaluation $PT Eval Low Complexity: 1 Low PT Treatments $Therapeutic Activity: 8-22 mins        Anise Salvo, PT Acute Rehab Services Pager [phone] [facility] [phone]    Billey Chang  Torah Pinnock 09/09/2019, 11:04 AM

## 2019-09-09 NOTE — Plan of Care (Signed)

## 2019-09-09 NOTE — Progress Notes (Signed)
Initial Nutrition Assessment  DOCUMENTATION CODES:   Severe malnutrition in context of chronic illness  INTERVENTION:    Ensure Enlive po BID, each supplement provides 350 kcal and 20 grams of protein  MVI daily   NUTRITION DIAGNOSIS:   Severe Malnutrition related to social / environmental circumstances as evidenced by severe fat depletion, severe muscle depletion.  GOAL:   Patient will meet greater than or equal to 90% of their needs  MONITOR:   PO intake, Supplement acceptance, Weight trends, Labs, I & O's  REASON FOR ASSESSMENT:   Malnutrition Screening Tool    ASSESSMENT:   Patient with PMH significant for COPD and ETOH abuse. Presents this admission after mechanical fall resulting in L rib fx and L PTX.   Pt sleeping with Natural Light in hand. RD attempted to wake pt but he continued to fall asleep throughout assessment. Per H&P, pt consumes 12-15 beers daily. Unsure of nutrition history. Will attempt to obtain at later date. Has not had first meal this admission. RD to provide supplementation to maximize kcal and protein this admission.   Pt denies weight loss but unsure of UBW. Records lack weight documentation over the last year. Admission weight looks to be stated. Need to obtain actual weight to assess for weight loss.    Drips: 1/2 NS with 20 mEq KCl @ 75 ml/hr  Medications: colace, folic acid, MVI with minerals, thiamine Labs: Na 134 (L)   NUTRITION - FOCUSED PHYSICAL EXAM:    Most Recent Value  Orbital Region Severe depletion  Upper Arm Region Severe depletion  Thoracic and Lumbar Region Unable to assess  Buccal Region Severe depletion  Temple Region Severe depletion  Clavicle Bone Region Severe depletion  Clavicle and Acromion Bone Region Severe depletion  Scapular Bone Region Unable to assess  Dorsal Hand Severe depletion  Patellar Region Severe depletion  Anterior Thigh Region Severe depletion  Posterior Calf Region Severe depletion  Edema  (RD Assessment) None  Hair Reviewed  Eyes Reviewed  Mouth Reviewed  Skin Reviewed  Nails Reviewed     Diet Order:   Diet Order            Diet regular Room service appropriate? Yes; Fluid consistency: Thin  Diet effective now                 EDUCATION NEEDS:   Not appropriate for education at this time  Skin:  Skin Assessment: Reviewed RN Assessment  Last BM:  PTA  Height:   Ht Readings from Last 1 Encounters:  09/08/19 5\' 11"  (1.803 m)    Weight:   Wt Readings from Last 1 Encounters:  09/08/19 63.5 kg    BMI:  Body mass index is 19.53 kg/m.  Estimated Nutritional Needs:   Kcal:  1900-2100 kcal  Protein:  95-115 grams  Fluid:  >/= 1.9 L/day   Mariana Single RD, LDN Clinical Nutrition Pager listed in [place]

## 2019-09-09 NOTE — Discharge Instructions (Signed)
Do not drive or operate heavy machinery while taking Oxycodone.  RIB FRACTURES  HOME INSTRUCTIONS   1. PAIN CONTROL:  1. Pain is best controlled by a usual combination of three different methods TOGETHER:  i. Ice/Heat ii. Over the counter pain medication iii. Prescription pain medication 2. You may experience some swelling and bruising in area of broken ribs. Ice packs or heating pads (30-60 minutes up to 6 times a day) will help. Use ice for the first few days to help decrease swelling and bruising, then switch to heat to help relax tight/sore spots and speed recovery. Some people prefer to use ice alone, heat alone, alternating between ice & heat. Experiment to what works for you. Swelling and bruising can take several weeks to resolve.  3. It is helpful to take an over-the-counter pain medication regularly for the first few weeks. Choose one of the following that works best for you:  i. Naproxen (Aleve, etc) Two 220mg  tabs twice a day ii. Ibuprofen (Advil, etc) Three 200mg  tabs four times a day (every meal & bedtime) iii. Acetaminophen (Tylenol, etc) 500-650mg  four times a day (every meal & bedtime) 4. A prescription for pain medication (such as oxycodone, hydrocodone, etc) may be given to you upon discharge. Take your pain medication as prescribed.  i. If you are having problems/concerns with the prescription medicine (does not control pain, nausea, vomiting, rash, itching, etc), please call us [phone] to see if we need to switch you to a different pain medicine that will work better for you and/or control your side effect better. ii. If you need a refill on your pain medication, please contact your pharmacy. They will contact our office to request authorization. Prescriptions will not be filled after 5 pm or on week-ends. 1. Avoid getting constipated. When taking pain medications, it is common to experience some constipation. Increasing fluid intake and taking a fiber supplement (such  as Metamucil, Citrucel, FiberCon, MiraLax, etc) 1-2 times a day regularly will usually help prevent this problem from occurring. A mild laxative (prune juice, Milk of Magnesia, MiraLax, etc) should be taken according to package directions if there are no bowel movements after 48 hours.  2. Watch out for diarrhea. If you have many loose bowel movements, simplify your diet to bland foods & liquids for a few days. Stop any stool softeners and decrease your fiber supplement. Switching to mild anti-diarrheal medications (Kayopectate, Pepto Bismol) can help. If this worsens or does not improve, please call us. 3. FOLLOW UP  a. If a follow up appointment is needed one will be scheduled for you. If none is needed with our trauma team, please follow up with your primary care provider within 2-3 weeks from discharge. Please call [facility] at [phone] if you have any questions about follow up.  b. If you have any orthopedic or other injuries you will need to follow up as outlined in your follow up instructions.   WHEN TO CALL us [phone]:  1. Poor pain control 2. Reactions / problems with new medications (rash/itching, nausea, etc)  3. Fever over 101.5 F (38.5 C) 4. Worsening swelling or bruising 5. Worsening pain, productive cough, difficulty breathing or any other concerning symptoms  The clinic staff is available to answer your questions during regular business hours (8:30am-5pm). Please don't hesitate to call and ask to speak to one of our nurses for clinical concerns.  If you have a medical emergency, go to the nearest emergency room or call 911.  A surgeon from [facility] is always on call at the [facility], [place], [place], [place],  [address] ?  MAIN: [phone] ? TOLL FREE: [phone] ?  FAX [phone]  [email or web address]      Information on Rib Fractures  A rib fracture is a break or crack in  one of the bones of the ribs. The ribs are long, curved bones that wrap around your chest and attach to your spine and your breastbone. The ribs protect your heart, lungs, and other organs in the chest. A broken or cracked rib is often painful but is not usually serious. Most rib fractures heal on their own over time. However, rib fractures can be more serious if multiple ribs are broken or if broken ribs move out of place and push against other structures or organs. What are the causes? This condition is caused by:  Repetitive movements with high force, such as pitching a baseball or having severe coughing spells.  A direct blow to the chest, such as a sports injury, a car accident, or a fall.  Cancer that has spread to the bones, which can weaken bones and cause them to break. What are the signs or symptoms? Symptoms of this condition include:  Pain when you breathe in or cough.  Pain when someone presses on the injured area.  Feeling short of breath. How is this diagnosed? This condition is diagnosed with a physical exam and medical history. Imaging tests may also be done, such as:  Chest X-ray.  CT scan.  MRI.  Bone scan.  Chest ultrasound. How is this treated? Treatment for this condition depends on the severity of the fracture. Most rib fractures usually heal on their own in 1-3 months. Sometimes healing takes longer if there is a cough that does not stop or if there are other activities that make the injury worse (aggravating factors). While you heal, you will be given medicines to control the pain. You will also be taught deep breathing exercises. Severe injuries may require hospitalization or surgery. Follow these instructions at home: Managing pain, stiffness, and swelling  If directed, apply ice to the injured area. ? Put ice in a plastic bag. ? Place a towel between your skin and the bag. ? Leave the ice on for 20 minutes, 2-3 times a day.  Take over-the-counter  and prescription medicines only as told by your health care provider. Activity  Avoid a lot of activity and any activities or movements that cause pain. Be careful during activities and avoid bumping the injured rib.  Slowly increase your activity as told by your health care provider. General instructions  Do deep breathing exercises as told by your health care provider. This helps prevent pneumonia, which is a common complication of a broken rib. Your health care provider may instruct you to: ? Take deep breaths several times a day. ? Try to cough several times a day, holding a pillow against the injured area. ? Use a device called incentive spirometer to practice deep breathing several times a day.  Drink enough fluid to keep your urine pale yellow.  Do not wear a rib belt or binder. These restrict breathing, which can lead to pneumonia.  Keep all follow-up visits as told by your health care provider. This is important. Contact a health care provider if:  You have a fever. Get help right away if:  You have difficulty breathing or you are  short of breath.  You develop a cough that does not stop, or you cough up thick or bloody sputum.  You have nausea, vomiting, or pain in your abdomen.  Your pain gets worse and medicine does not help. Summary  A rib fracture is a break or crack in one of the bones of the ribs.  A broken or cracked rib is often painful but is not usually serious.  Most rib fractures heal on their own over time.  Treatment for this condition depends on the severity of the fracture.  Avoid a lot of activity and any activities or movements that cause pain. This information is not intended to replace advice given to you by your health care provider. Make sure you discuss any questions you have with your health care provider. Document Released: 02/10/2005 Document Revised: 05/12/2016 Document Reviewed: 05/12/2016 Elsevier Interactive Patient Education  2019  [employer]   Pneumothorax A pneumothorax is commonly called a collapsed lung. It is a condition in which air leaks from a lung and builds up between the thin layer of tissue that covers the lungs (visceral pleura) and the interior wall of the chest cavity (parietal pleura). The air gets trapped outside the lung, between the lung and the chest wall (pleural space). The air takes up space and prevents the lung from fully expanding. This condition sometimes occurs suddenly with no apparent cause. The buildup of air may be small or large. A small pneumothorax may go away on its own. A large pneumothorax will require treatment and hospitalization. What are the causes? This condition may be caused by:  Trauma and injury to the chest wall.  Surgery and other medical procedures.  A complication of an underlying lung problem, especially chronic obstructive pulmonary disease (COPD) or emphysema. Sometimes the cause of this condition is not known. What increases the risk? You are more likely to develop this condition if:  You have an underlying lung problem.  You smoke.  You are 12-32 years old, male, tall, and underweight.  You have a personal or family history of pneumothorax.  You have an eating disorder (anorexia nervosa). This condition can also happen quickly, even in people with no history of lung problems. What are the signs or symptoms? Sometimes a pneumothorax will have no symptoms. When symptoms are present, they can include:  Chest pain.  Shortness of breath.  Increased rate of breathing.  Bluish color to your lips or skin (cyanosis). How is this diagnosed? This condition may be diagnosed by:  A medical history and physical exam.  A chest X-ray, chest CT scan, or ultrasound. How is this treated? Treatment depends on how severe your condition is. The goal of treatment is to remove the extra air and allow your lung to expand back to its normal size.  For a small  pneumothorax: ? No treatment may be needed. ? Extra oxygen is sometimes used to make it go away more quickly.  For a large pneumothorax or a pneumothorax that is causing symptoms, a procedure is done to drain the air from your lungs. To do this, a health care provider may use: ? A needle with a syringe. This is used to suck air from a pleural space where no additional leakage is taking place. ? A chest tube. This is used to suck air where there is ongoing leakage into the pleural space. The chest tube may need to remain in place for several days until the air leak has healed.  In more  severe cases, surgery may be needed to repair the damage that is causing the leak.  If you have multiple pneumothorax episodes or have an air leak that will not heal, a procedure called a pleurodesis may be done. A medicine is placed in the pleural space to irritate the tissues around the lung so that the lung will stick to the chest wall, seal any leaks, and stop any buildup of air in that space. If you have an underlying lung problem, severe symptoms, or a large pneumothorax you will usually need to stay in the hospital. Follow these instructions at home: Lifestyle  Do not use any products that contain nicotine or tobacco, such as cigarettes and e-cigarettes. These are major risk factors in pneumothorax. If you need help quitting, ask your health care provider.  Do not lift anything that is heavier than 10 lb (4.5 kg), or the limit that your health care provider tells you, until he or she says that it is safe.  Avoid activities that take a lot of effort (strenuous) for as long as told by your health care provider.  Return to your normal activities as told by your health care provider. Ask your health care provider what activities are safe for you.  Do not fly in an airplane or scuba dive until your health care provider says it is okay. General instructions  Take over-the-counter and prescription medicines  only as told by your health care provider.  If a cough or pain makes it difficult for you to sleep at night, try sleeping in a semi-upright position in a recliner or by using 2 or 3 pillows.  If you had a chest tube and it was removed, ask your health care provider when you can remove the bandage (dressing). While the dressing is in place, do not allow it to get wet.  Keep all follow-up visits as told by your health care provider. This is important. Contact a health care provider if:  You cough up thick mucus (sputum) that is yellow or green in color.  You were treated with a chest tube, and you have redness, increasing pain, or discharge at the site where it was placed. Get help right away if:  You have increasing chest pain or shortness of breath.  You have a cough that will not go away.  You begin coughing up blood.  You have pain that is getting worse or is not controlled with medicines.  The site where your chest tube was located opens up.  You feel air coming out of the site where the chest tube was placed.  You have a fever or persistent symptoms for more than 2-3 days.  You have a fever and your symptoms suddenly get worse. These symptoms may represent a serious problem that is an emergency. Do not wait to see if the symptoms will go away. Get medical help right away. Call your local emergency services (911 in the [place] Do not drive yourself to the hospital. Summary  A pneumothorax, commonly called a collapsed lung, is a condition in which air leaks from a lung and gets trapped between the lung and the chest wall (pleural space).  The buildup of air may be small or large. A small pneumothorax may go away on its own. A large pneumothorax will require treatment and hospitalization.  Treatment for this condition depends on how severe the pneumothorax is. The goal of treatment is to remove the extra air and allow the lung to expand back to  its normal size. This information  is not intended to replace advice given to you by your health care provider. Make sure you discuss any questions you have with your health care provider. Document Revised: 01/23/2017 Document Reviewed: 01/19/2017 Elsevier Patient Education  2020 [employer].

## 2019-09-13 ENCOUNTER — Other Ambulatory Visit: Payer: Self-pay | Admitting: Cardiothoracic Surgery

## 2019-09-13 DIAGNOSIS — S2242XA Multiple fractures of ribs, left side, initial encounter for closed fracture: Secondary | ICD-10-CM

## 2019-09-14 ENCOUNTER — Other Ambulatory Visit: Payer: Self-pay

## 2019-09-14 ENCOUNTER — Encounter: Payer: Self-pay | Admitting: Cardiothoracic Surgery

## 2019-09-14 ENCOUNTER — Ambulatory Visit: Payer: Medicare Other | Admitting: Cardiothoracic Surgery

## 2019-09-14 ENCOUNTER — Ambulatory Visit
Admission: RE | Admit: 2019-09-14 | Discharge: 2019-09-14 | Disposition: A | Discharge status: Home / Self Care | Payer: Medicare Other | Source: Ambulatory Visit | Attending: Cardiothoracic Surgery | Admitting: Cardiothoracic Surgery

## 2019-09-14 VITALS — BP 150/93 | HR 110 | Temp 97.7°F | Resp 20 | Ht 71.0 in | Wt 131.0 lb

## 2019-09-14 DIAGNOSIS — J939 Pneumothorax, unspecified: Secondary | ICD-10-CM | POA: Diagnosis not present

## 2019-09-14 DIAGNOSIS — R0789 Other chest pain: Secondary | ICD-10-CM | POA: Insufficient documentation

## 2019-09-14 DIAGNOSIS — S270XXA Traumatic pneumothorax, initial encounter: Secondary | ICD-10-CM

## 2019-09-14 IMAGING — DX DG CHEST 2V
2 series · 2 of 2 positions shown · non-contrast
Comparison: [DATE] radiographic and CT exams

CLINICAL DATA: Closed ABO HADID fracture of the ribs

EXAM:
CHEST - 2 VIEW

[dg chest 2 view (1 of 2)]
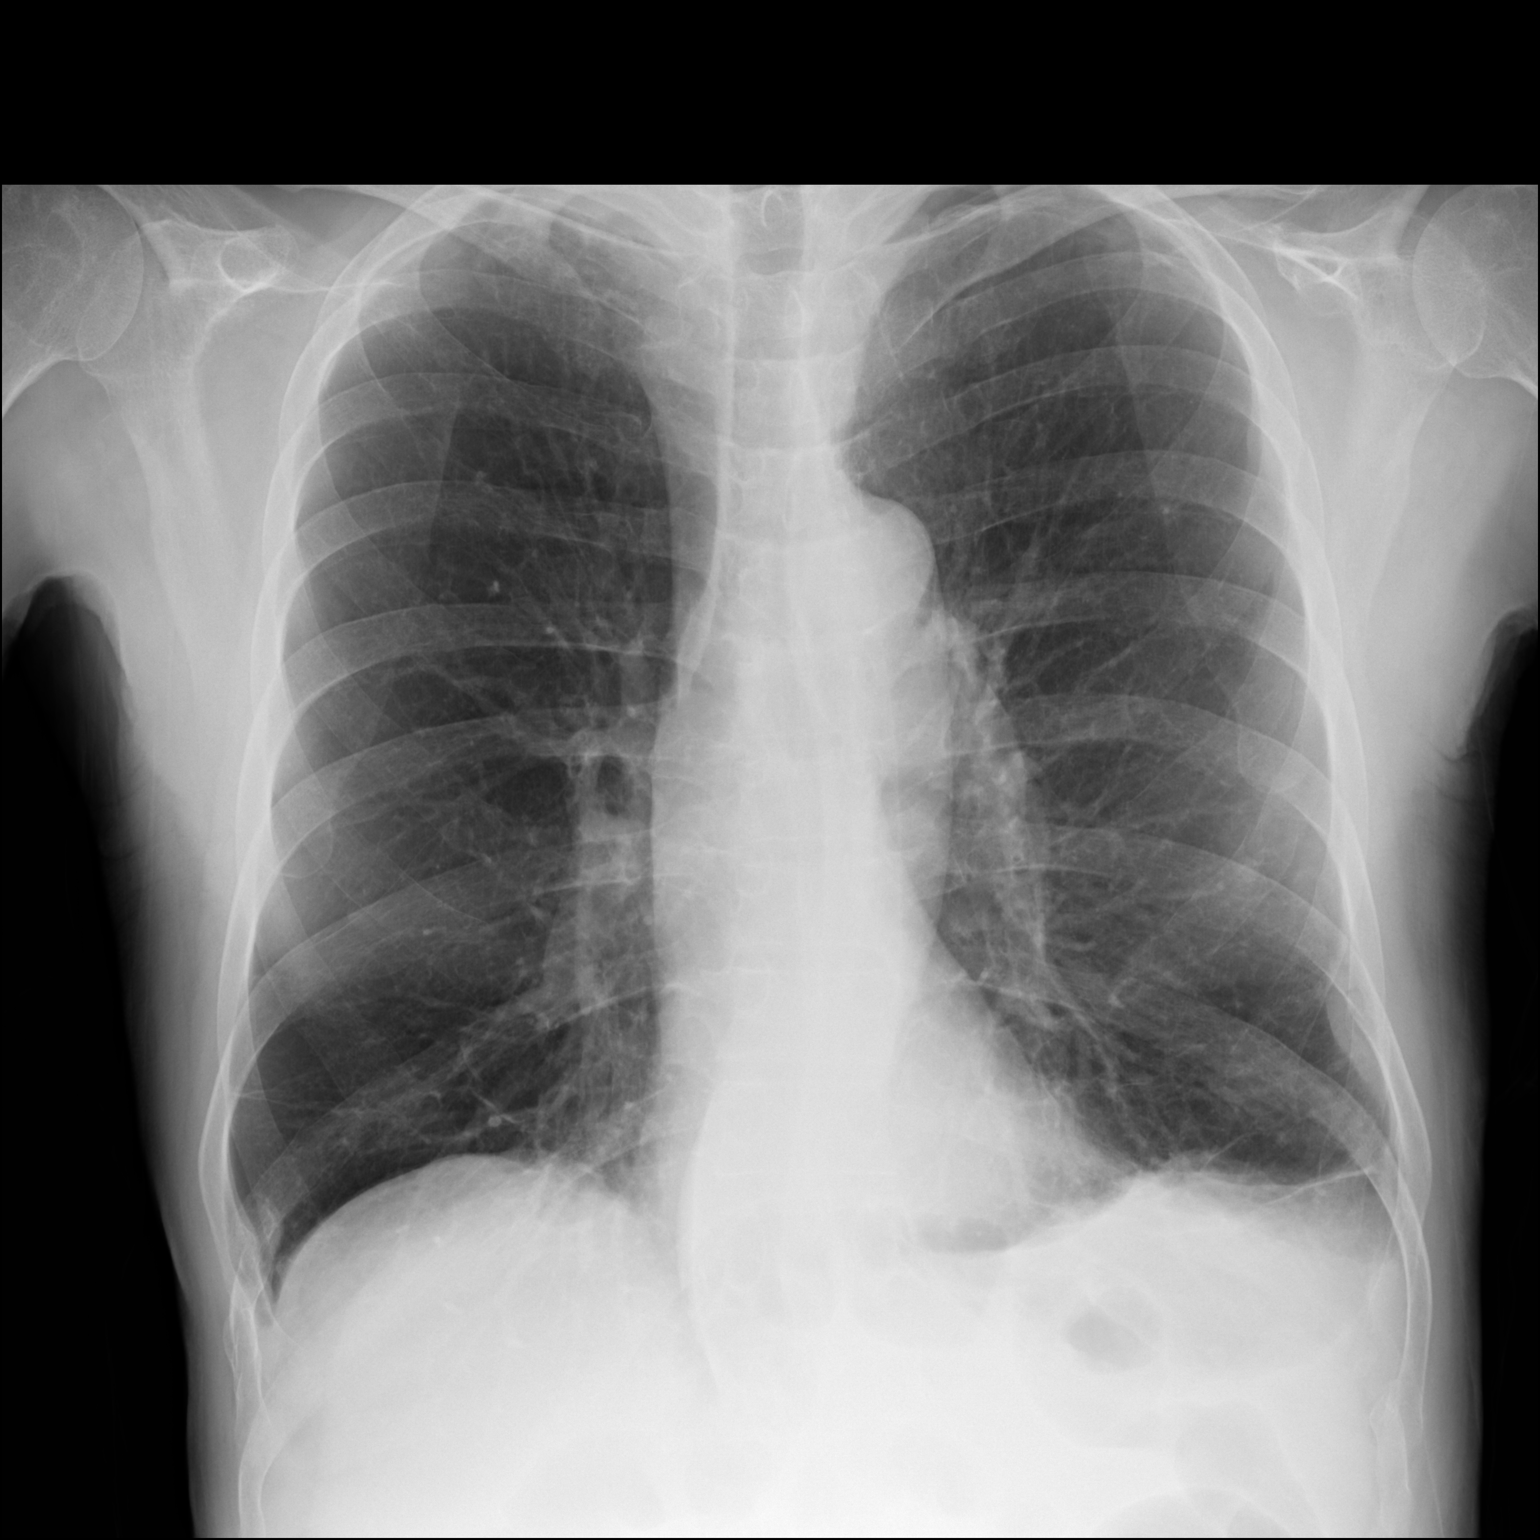

[dg chest 2 view (2 of 2)]
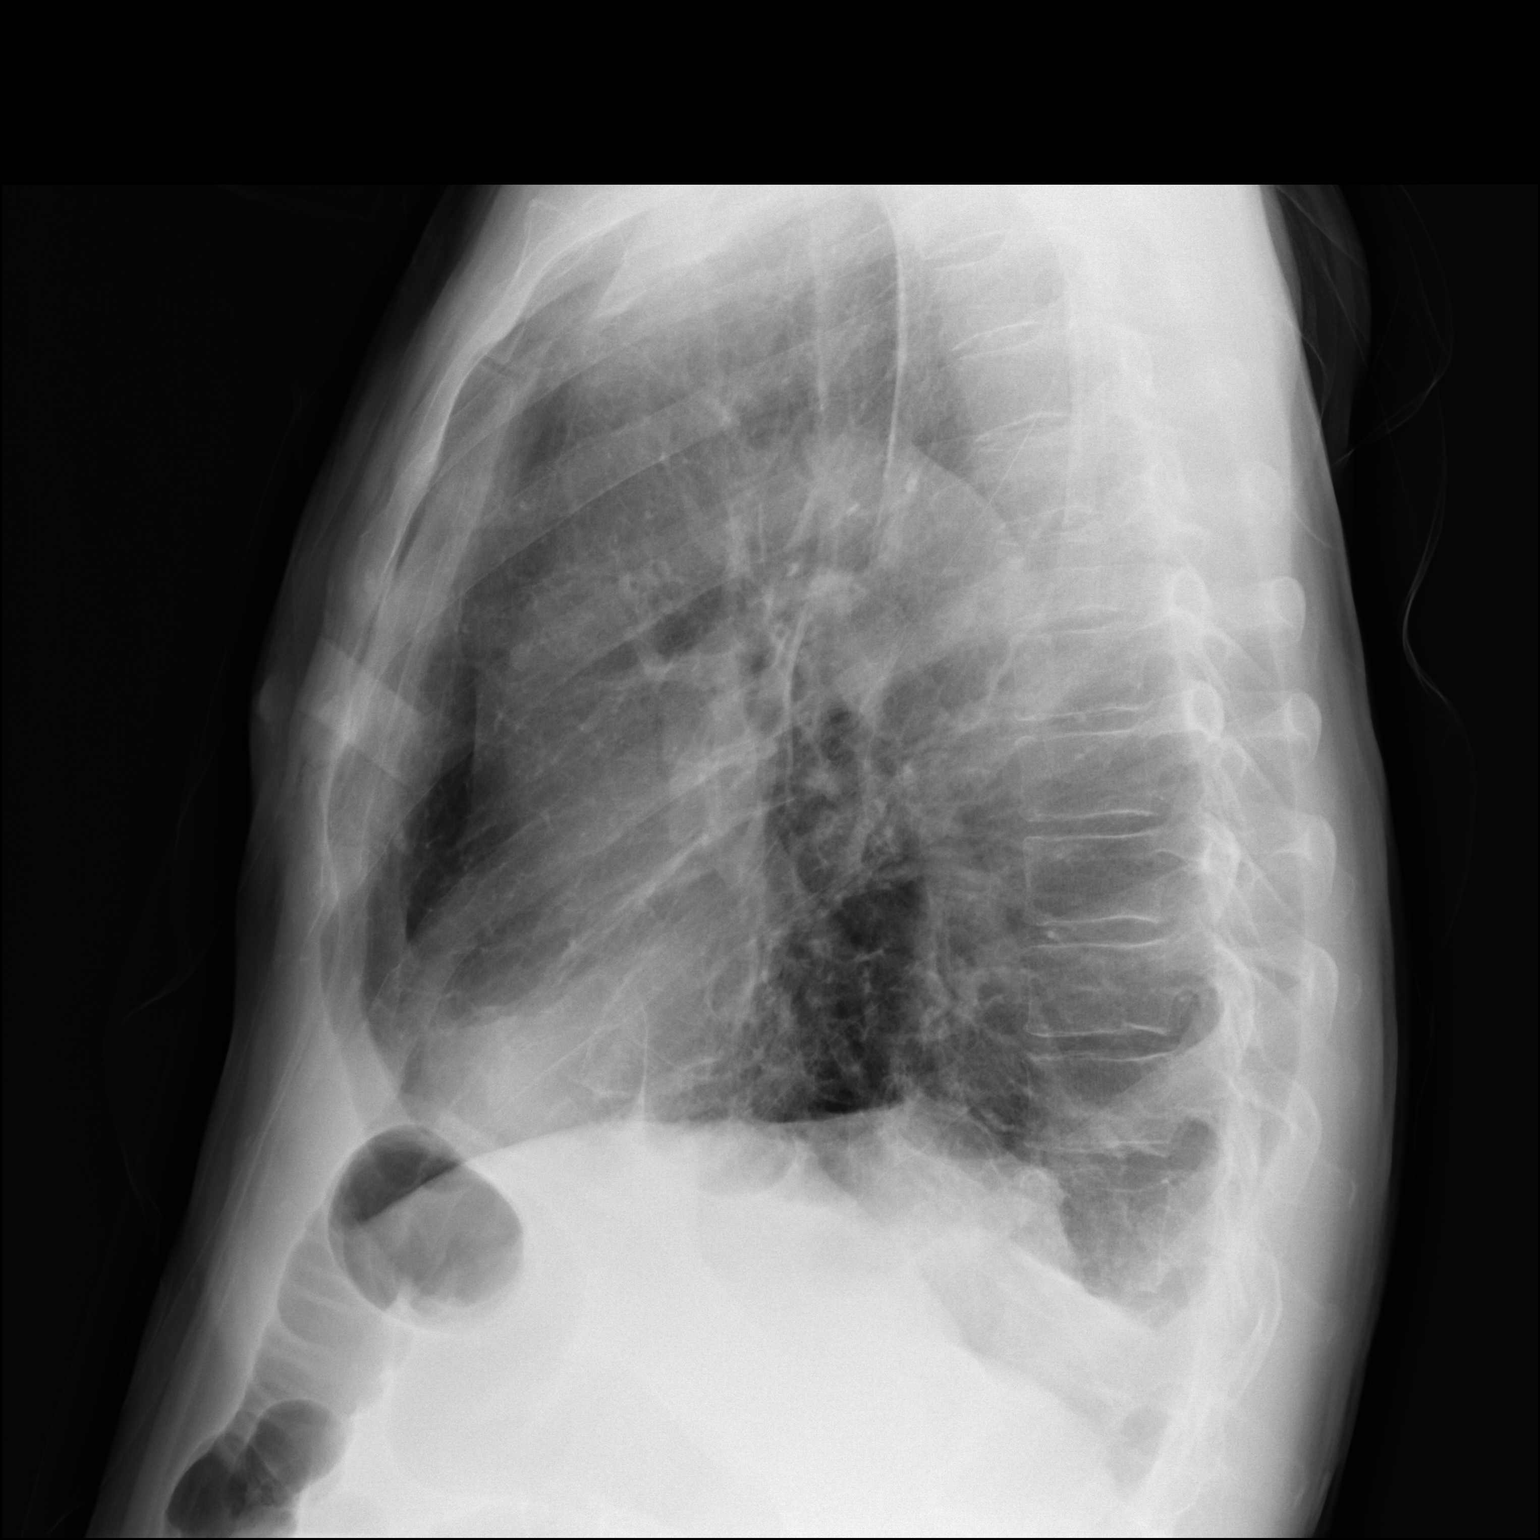

[2 of 2 positions shown; findings below may reference images not displayed]

FINDINGS: Normal heart size, mediastinal contours, and pulmonary vascularity.

Minimal atherosclerotic calcification aorta.

Hyperinflated lungs with minimal atelectasis and effusion at LEFT
base.

Tiny LEFT apex pneumothorax.

Remaining lungs clear.

Fractures of lateral LEFT fifth, sixth, and eighth ribs.
IMPRESSION: Fractures of LEFT fifth, sixth, and eighth ribs.

Hyperinflated lungs with minimal LEFT basilar atelectasis and
pleural effusion.

Tiny LEFT apex pneumothorax, not definitely seen on prior
radiographic exam, but only minimally increased from prior CT exam.

These results will be called to the ordering clinician or
representative by the Radiologist Assistant, and communication
documented in the PACS or [REDACTED].

## 2019-09-14 MED ORDER — OXYCODONE HCL 5 MG PO TABS: 5.0000 mg | ORAL_TABLET | Freq: Four times a day (QID) | ORAL | 0 refills | Status: DC | PRN

## 2019-09-14 NOTE — Progress Notes (Signed)
PCP is Patient, No Pcp Per Referring Provider is Davonna Belling, MD  Chief Complaint  Patient presents with  . Spontaneous Pneumothorax    f/u with CXR    HPI: 64 year old man returns with chest x-ray for follow-up of a small left apical pneumothorax associated with 3 rib fractures he sustained during a fall at home.  He states he tripped.  He was seen in the ED which showed a small 10% left apical pneumothorax and 3 nondisplaced rib fractures.  A follow-up chest x-ray several hours later showed no increase in the pneumothorax and the patient was discharged to home with oxygen saturation greater than 90%.  He was given pain medication.  He is a non-smoker. He states the pain is significant but improving.  He manages his pain with 1 oxycodone tablet twice a day.  He has associated no shortness of breath productive cough hemoptysis fever   Past Medical History:  Diagnosis Date  . COPD (chronic obstructive pulmonary disease) ([place])     History reviewed. No pertinent surgical history.  History reviewed. No pertinent family history.  Social History Social History   Tobacco Use  . Smoking status: Current Every Day Smoker    Types: Cigarettes  . Smokeless tobacco: Never Used  Substance Use Topics  . Alcohol use: Yes    Comment: daily  . Drug use: Not on file    Current Outpatient Medications  Medication Sig Dispense Refill  . Multiple Vitamin (MULTIVITAMIN WITH MINERALS) TABS tablet Take 1 tablet by mouth daily.    Marland Kitchen oxyCODONE (OXY IR/ROXICODONE) 5 MG immediate release tablet Take 1 tablet (5 mg total) by mouth every 6 (six) hours as needed for breakthrough pain. 15 tablet 0  . ibuprofen (ADVIL) 200 MG tablet Take 200 mg by mouth every 6 (six) hours as needed for moderate pain. (Patient not taking: Reported on 09/14/2019)     No current facility-administered medications for this visit.    No Known Allergies  Review of Systems   No previous thoracic surgery or trauma No  orthopnea or PND No abdominal pain No difficulty swallowing No nausea vomiting No subcutaneous emphysema noted BP (!) 150/93   Pulse (!) 110   Temp 97.7 F (36.5 C) (Skin)   Resp 20   Ht 5\' 11"  (1.803 m)   Wt 131 lb (59.4 kg)   SpO2 91% Comment: RA  BMI 18.27 kg/m  Physical Exam      Exam    General- alert and comfortable    Neck- no JVD, no cervical adenopathy palpable, no carotid bruit   Lungs- clear without rales, wheezes.  Tenderness along the left lateral chest wall but no instability or bruising   Cor- regular rate and rhythm, no murmur , gallop   Abdomen- soft, non-tender   Extremities - warm, non-tender, minimal edema   Neuro- oriented, appropriate, no focal weakness   Diagnostic Tests: Chest x-ray today personally reviewed showing resolution of left apical pneumothorax, COPD changes stable  Impression: Resolved left pneumothorax following a fall associated with 3 nondisplaced rib fractures on left.  Plan: Patient was given an another refill for his pain medication.  he is advised not to smoke or lift anything more than 10 pounds.  He needs 1 more follow-up chest x-ray in 3 weeks with an office visit.  Len Childs, MD [facility] [phone]

## 2019-10-11 ENCOUNTER — Other Ambulatory Visit: Payer: Self-pay | Admitting: Cardiothoracic Surgery

## 2019-10-11 DIAGNOSIS — R0789 Other chest pain: Secondary | ICD-10-CM

## 2019-10-12 ENCOUNTER — Encounter: Payer: Self-pay | Admitting: Cardiothoracic Surgery

## 2019-10-12 ENCOUNTER — Ambulatory Visit: Payer: Medicare Other | Admitting: Cardiothoracic Surgery

## 2019-10-12 ENCOUNTER — Ambulatory Visit
Admission: RE | Admit: 2019-10-12 | Discharge: 2019-10-12 | Disposition: A | Discharge status: Home / Self Care | Payer: Medicare Other | Source: Ambulatory Visit | Attending: Cardiothoracic Surgery | Admitting: Cardiothoracic Surgery

## 2019-10-12 ENCOUNTER — Other Ambulatory Visit: Payer: Self-pay

## 2019-10-12 VITALS — BP 150/80 | HR 97 | Temp 97.3°F | Resp 16 | Ht 71.0 in | Wt 133.8 lb

## 2019-10-12 DIAGNOSIS — S2242XA Multiple fractures of ribs, left side, initial encounter for closed fracture: Secondary | ICD-10-CM

## 2019-10-12 DIAGNOSIS — Z09 Encounter for follow-up examination after completed treatment for conditions other than malignant neoplasm: Secondary | ICD-10-CM | POA: Insufficient documentation

## 2019-10-12 DIAGNOSIS — S270XXA Traumatic pneumothorax, initial encounter: Secondary | ICD-10-CM

## 2019-10-12 DIAGNOSIS — R0789 Other chest pain: Secondary | ICD-10-CM

## 2019-10-12 IMAGING — CR DG CHEST 2V
2 series · 2 of 2 positions shown · non-contrast
Comparison: [DATE]

CLINICAL DATA: LEFT side chest wall pain today, history COPD,
smoker

EXAM:
CHEST - 2 VIEW

[w chest pa]
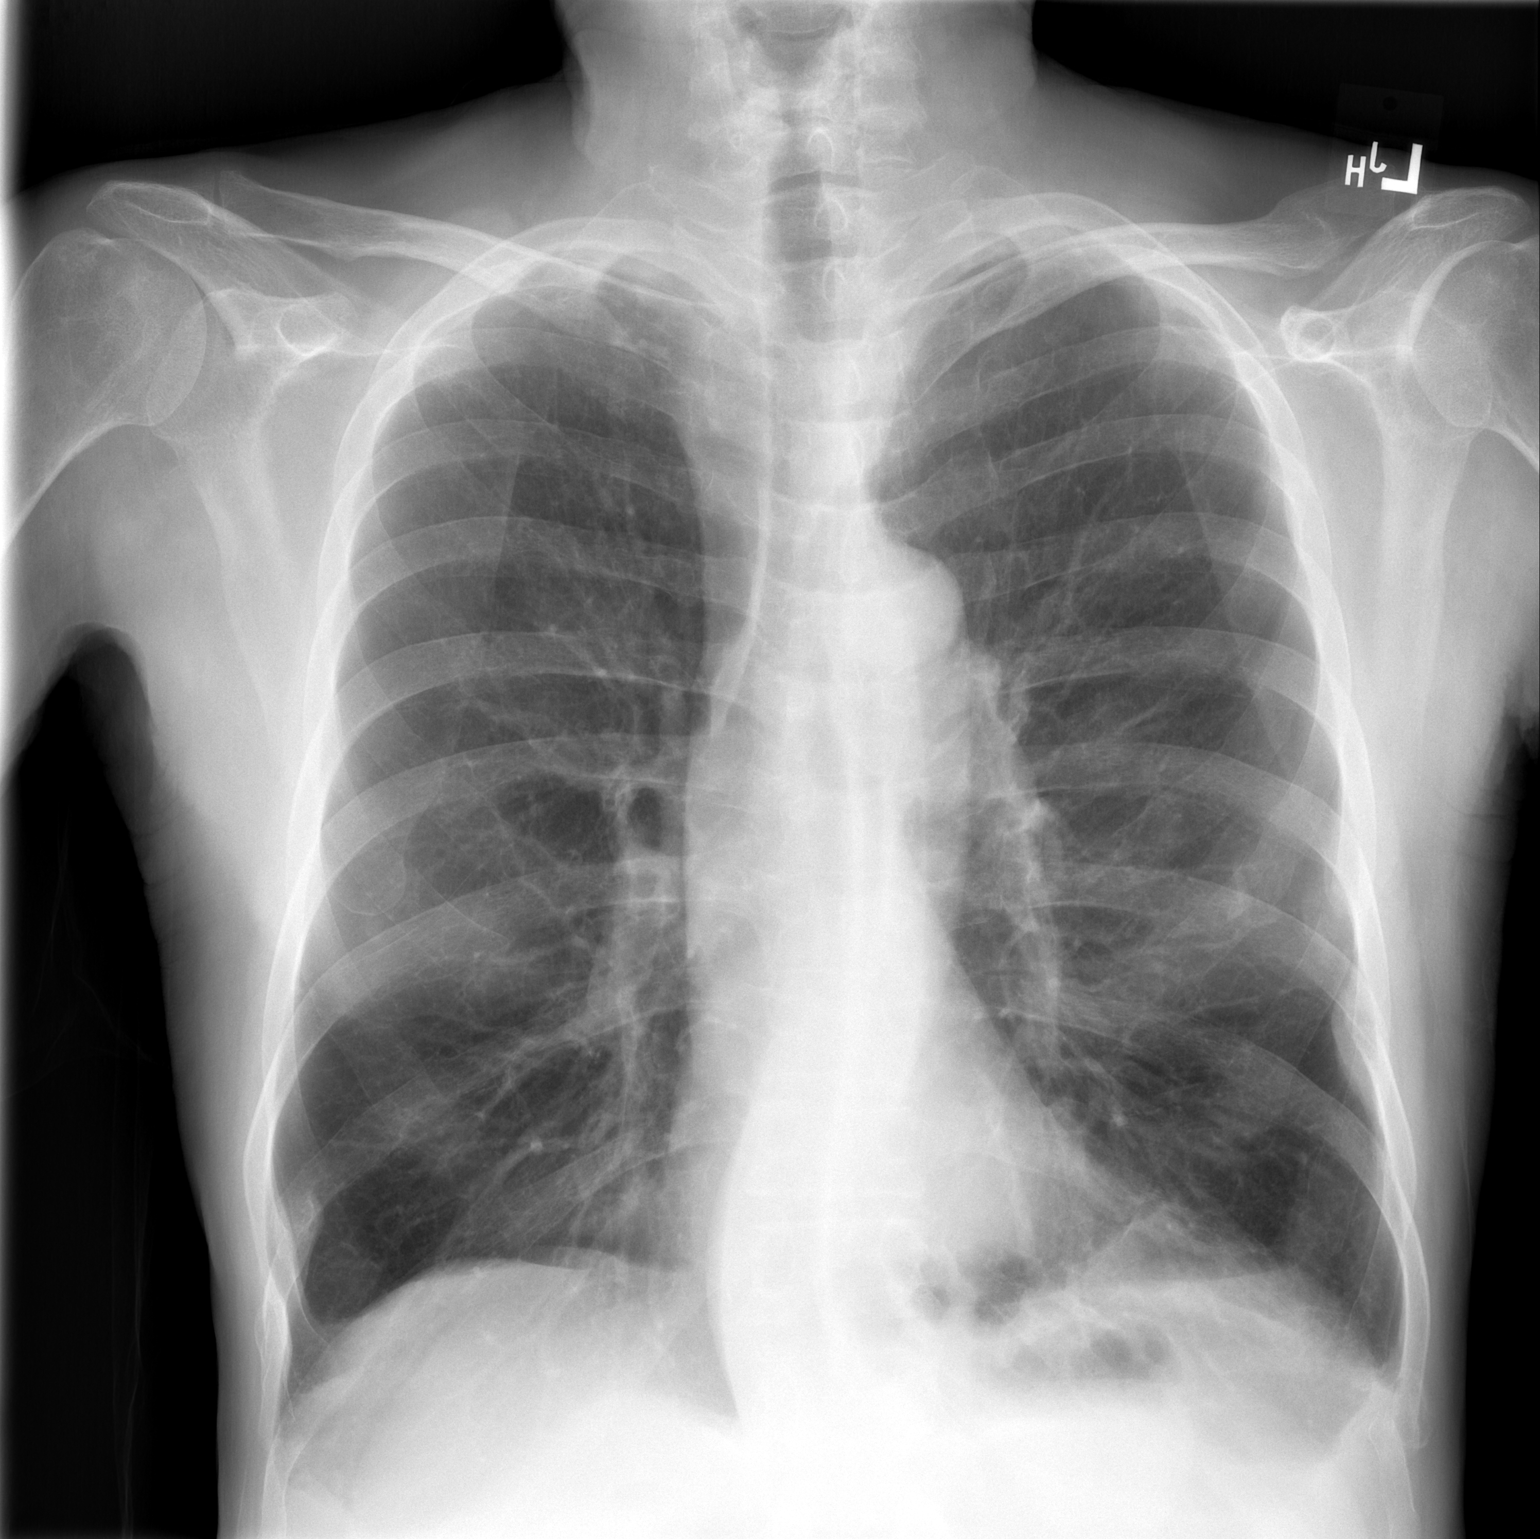

[w chest lat]
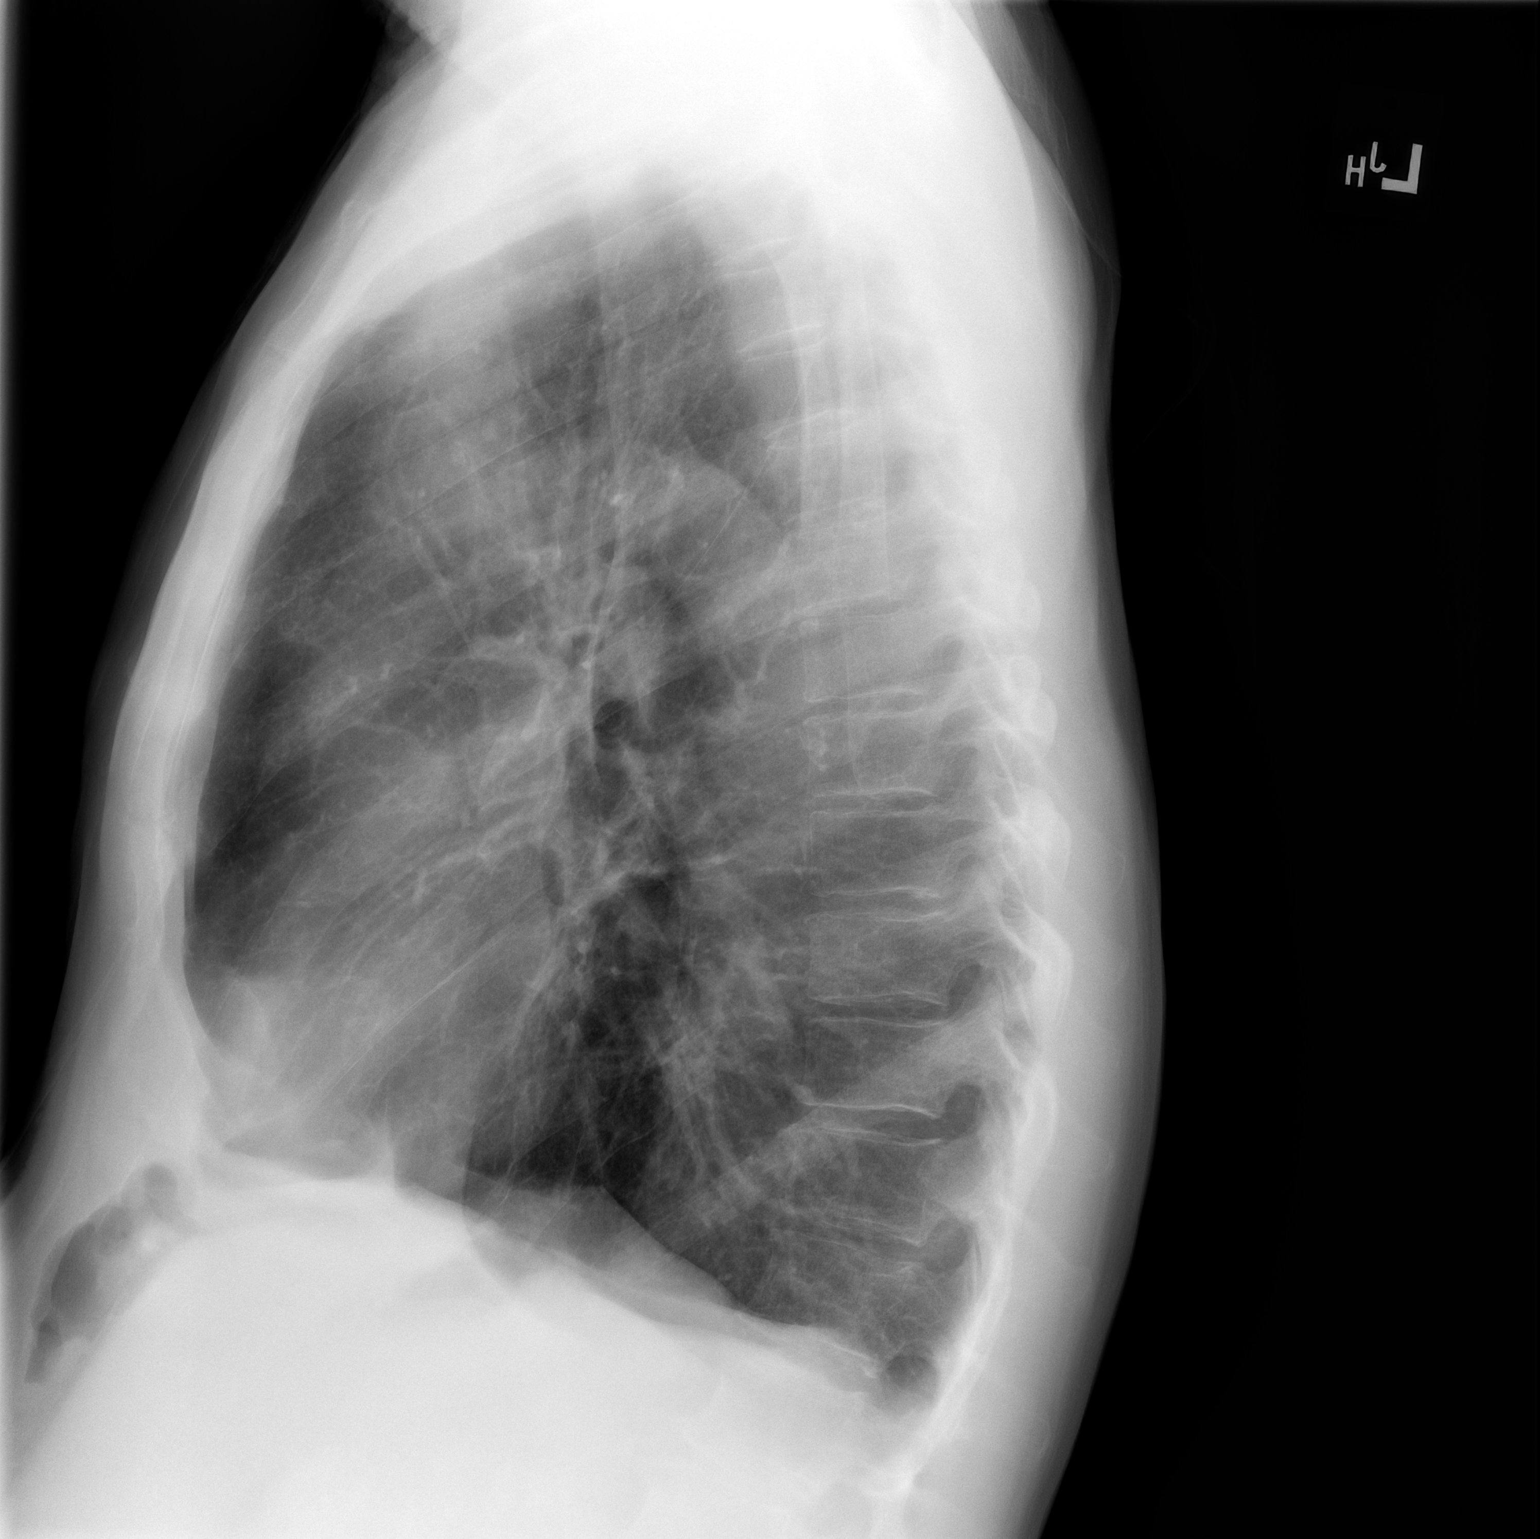

[2 of 2 positions shown; findings below may reference images not displayed]

FINDINGS: Normal heart size, mediastinal contours, and pulmonary vascularity.

Emphysematous and minimal bronchitic changes consistent with COPD.

Mild RIGHT apical scarring, stable.

No pulmonary infiltrate, pleural effusion or pneumothorax.

Healing fractures of multiple mid LEFT ribs.

Osseous structures otherwise unremarkable.
IMPRESSION: COPD changes with RIGHT apical scarring.

No acute pulmonary abnormalities.

Healing LEFT rib fractures.

## 2019-10-12 NOTE — Progress Notes (Signed)
PCP is Patient, No Pcp Per Referring Provider is Davonna Belling, MD  Chief Complaint  Patient presents with  . Follow-up    Left PTX with a CXR    HPI: Office visit with chest x-ray to assess progress following 3 nondisplaced left rib fractures from a fall at home.  This occurred about 6 weeks ago.  The pain is much better.  Does not need any meds.  He feels no popping or clicking sensation.  He has no increased shortness of breath over the that associated with his COPD.  Mildly productive cough but no hemoptysis  I personally reviewed the chest x-ray taken today which shows healing ribs on the left, #7 8 and 9.  No pneumothorax.  Stable COPD.  No evidence of abnormal adenopathy in the chest. Past Medical History:  Diagnosis Date  . COPD (chronic obstructive pulmonary disease) ([place])     History reviewed. No pertinent surgical history.  History reviewed. No pertinent family history.  Social History Social History   Tobacco Use  . Smoking status: Current Every Day Smoker    Types: Cigarettes  . Smokeless tobacco: Never Used  Substance Use Topics  . Alcohol use: Yes    Comment: daily  . Drug use: Not on file    Current Outpatient Medications  Medication Sig Dispense Refill  . Multiple Vitamin (MULTIVITAMIN WITH MINERALS) TABS tablet Take 1 tablet by mouth daily.    Marland Kitchen ibuprofen (ADVIL) 200 MG tablet Take 200 mg by mouth every 6 (six) hours as needed for moderate pain. (Patient not taking: Reported on 09/14/2019)    . oxyCODONE (OXY IR/ROXICODONE) 5 MG immediate release tablet Take 1 tablet (5 mg total) by mouth every 6 (six) hours as needed for breakthrough pain. (Patient not taking: Reported on 10/12/2019) 15 tablet 0   No current facility-administered medications for this visit.    No Known Allergies  Review of Systems  No fever Stiffness in the left chest when he wakes up in the morning but no significant pain during the day.  BP (!) 150/80 (BP Location: Right Arm,  Patient Position: Sitting, Cuff Size: Normal)   Pulse 97   Temp (!) 97.3 F (36.3 C)   Resp 16   Ht 5\' 11"  (1.803 m)   Wt 133 lb 12.8 oz (60.7 kg)   SpO2 95% Comment: RA  BMI 18.66 kg/m  Physical Exam Alert and appropriate Lungs with scattered rhonchi Heart rate regular without murmur or gallop No deformity of the left chest wall with minimal tenderness over the left lateral ribs Abdomen soft Extremities well perfused without edema Neuro intact  Diagnostic Tests: Personal review of today's chest x-ray shows healing nondisplaced left rib fractures without pneumothorax  Impression: Patient should continue to progress and heal from the nondisplaced rib fractures.  No further x-rays are needed.  Plan: No further follow-up needed.  Patient will try to reduce smoking and stop.  He understands importance of this to avoid end-stage lung disease and complete loss  of exercise tolerance   Len Childs, MD [facility] [phone]

## 2020-05-20 ENCOUNTER — Ambulatory Visit (HOSPITAL_COMMUNITY): Payer: Self-pay

## 2020-05-25 ENCOUNTER — Inpatient Hospital Stay (HOSPITAL_COMMUNITY)
Admission: EM | Admit: 2020-05-25 | Discharge: 2020-06-21 | DRG: 329 | Disposition: A | Discharge status: Home Health | Payer: Medicare Other | Attending: Internal Medicine | Admitting: Internal Medicine

## 2020-05-25 ENCOUNTER — Other Ambulatory Visit: Payer: Self-pay

## 2020-05-25 ENCOUNTER — Emergency Department (HOSPITAL_COMMUNITY): Payer: Medicare Other

## 2020-05-25 ENCOUNTER — Ambulatory Visit (INDEPENDENT_AMBULATORY_CARE_PROVIDER_SITE_OTHER): Payer: Medicare Other | Admitting: Family Medicine

## 2020-05-25 ENCOUNTER — Encounter (HOSPITAL_BASED_OUTPATIENT_CLINIC_OR_DEPARTMENT_OTHER): Payer: Self-pay | Admitting: Family Medicine

## 2020-05-25 DIAGNOSIS — R1310 Dysphagia, unspecified: Secondary | ICD-10-CM | POA: Diagnosis not present

## 2020-05-25 DIAGNOSIS — Z0189 Encounter for other specified special examinations: Secondary | ICD-10-CM

## 2020-05-25 DIAGNOSIS — D599 Acquired hemolytic anemia, unspecified: Secondary | ICD-10-CM | POA: Diagnosis not present

## 2020-05-25 DIAGNOSIS — C19 Malignant neoplasm of rectosigmoid junction: Secondary | ICD-10-CM

## 2020-05-25 DIAGNOSIS — Z681 Body mass index (BMI) 19 or less, adult: Secondary | ICD-10-CM

## 2020-05-25 DIAGNOSIS — C20 Malignant neoplasm of rectum: Secondary | ICD-10-CM | POA: Diagnosis not present

## 2020-05-25 DIAGNOSIS — K6289 Other specified diseases of anus and rectum: Secondary | ICD-10-CM | POA: Diagnosis not present

## 2020-05-25 DIAGNOSIS — R42 Dizziness and giddiness: Secondary | ICD-10-CM | POA: Diagnosis not present

## 2020-05-25 DIAGNOSIS — D509 Iron deficiency anemia, unspecified: Secondary | ICD-10-CM | POA: Diagnosis not present

## 2020-05-25 DIAGNOSIS — L03311 Cellulitis of abdominal wall: Secondary | ICD-10-CM | POA: Diagnosis not present

## 2020-05-25 DIAGNOSIS — K3189 Other diseases of stomach and duodenum: Secondary | ICD-10-CM | POA: Diagnosis present

## 2020-05-25 DIAGNOSIS — Z600 Problems of adjustment to life-cycle transitions: Secondary | ICD-10-CM

## 2020-05-25 DIAGNOSIS — F1010 Alcohol abuse, uncomplicated: Secondary | ICD-10-CM | POA: Diagnosis present

## 2020-05-25 DIAGNOSIS — K219 Gastro-esophageal reflux disease without esophagitis: Secondary | ICD-10-CM | POA: Diagnosis not present

## 2020-05-25 DIAGNOSIS — K2970 Gastritis, unspecified, without bleeding: Secondary | ICD-10-CM | POA: Diagnosis present

## 2020-05-25 DIAGNOSIS — R000 Tachycardia, unspecified: Secondary | ICD-10-CM | POA: Diagnosis not present

## 2020-05-25 DIAGNOSIS — T8149XA Infection following a procedure, other surgical site, initial encounter: Secondary | ICD-10-CM | POA: Diagnosis not present

## 2020-05-25 DIAGNOSIS — K567 Ileus, unspecified: Secondary | ICD-10-CM | POA: Diagnosis not present

## 2020-05-25 DIAGNOSIS — Z7289 Other problems related to lifestyle: Secondary | ICD-10-CM | POA: Diagnosis not present

## 2020-05-25 DIAGNOSIS — D649 Anemia, unspecified: Secondary | ICD-10-CM | POA: Diagnosis present

## 2020-05-25 DIAGNOSIS — K9189 Other postprocedural complications and disorders of digestive system: Secondary | ICD-10-CM | POA: Diagnosis not present

## 2020-05-25 DIAGNOSIS — K2210 Ulcer of esophagus without bleeding: Secondary | ICD-10-CM | POA: Diagnosis present

## 2020-05-25 DIAGNOSIS — C187 Malignant neoplasm of sigmoid colon: Secondary | ICD-10-CM | POA: Diagnosis present

## 2020-05-25 DIAGNOSIS — K222 Esophageal obstruction: Secondary | ICD-10-CM | POA: Diagnosis present

## 2020-05-25 DIAGNOSIS — E876 Hypokalemia: Secondary | ICD-10-CM | POA: Diagnosis not present

## 2020-05-25 DIAGNOSIS — R64 Cachexia: Secondary | ICD-10-CM | POA: Diagnosis present

## 2020-05-25 DIAGNOSIS — Z87891 Personal history of nicotine dependence: Secondary | ICD-10-CM | POA: Diagnosis not present

## 2020-05-25 DIAGNOSIS — D500 Iron deficiency anemia secondary to blood loss (chronic): Secondary | ICD-10-CM | POA: Diagnosis not present

## 2020-05-25 DIAGNOSIS — L299 Pruritus, unspecified: Secondary | ICD-10-CM | POA: Insufficient documentation

## 2020-05-25 DIAGNOSIS — R600 Localized edema: Secondary | ICD-10-CM | POA: Diagnosis present

## 2020-05-25 DIAGNOSIS — E871 Hypo-osmolality and hyponatremia: Secondary | ICD-10-CM | POA: Diagnosis present

## 2020-05-25 DIAGNOSIS — R634 Abnormal weight loss: Secondary | ICD-10-CM

## 2020-05-25 DIAGNOSIS — K2100 Gastro-esophageal reflux disease with esophagitis, without bleeding: Secondary | ICD-10-CM | POA: Diagnosis present

## 2020-05-25 DIAGNOSIS — E43 Unspecified severe protein-calorie malnutrition: Secondary | ICD-10-CM | POA: Diagnosis present

## 2020-05-25 DIAGNOSIS — K6389 Other specified diseases of intestine: Secondary | ICD-10-CM | POA: Diagnosis not present

## 2020-05-25 DIAGNOSIS — K269 Duodenal ulcer, unspecified as acute or chronic, without hemorrhage or perforation: Secondary | ICD-10-CM | POA: Diagnosis not present

## 2020-05-25 DIAGNOSIS — D62 Acute posthemorrhagic anemia: Secondary | ICD-10-CM | POA: Diagnosis not present

## 2020-05-25 DIAGNOSIS — M545 Low back pain, unspecified: Secondary | ICD-10-CM | POA: Diagnosis present

## 2020-05-25 DIAGNOSIS — R0602 Shortness of breath: Secondary | ICD-10-CM | POA: Insufficient documentation

## 2020-05-25 DIAGNOSIS — K641 Second degree hemorrhoids: Secondary | ICD-10-CM | POA: Diagnosis present

## 2020-05-25 DIAGNOSIS — K2960 Other gastritis without bleeding: Secondary | ICD-10-CM | POA: Diagnosis present

## 2020-05-25 DIAGNOSIS — I959 Hypotension, unspecified: Secondary | ICD-10-CM | POA: Diagnosis not present

## 2020-05-25 DIAGNOSIS — Z4659 Encounter for fitting and adjustment of other gastrointestinal appliance and device: Secondary | ICD-10-CM

## 2020-05-25 DIAGNOSIS — K922 Gastrointestinal hemorrhage, unspecified: Secondary | ICD-10-CM

## 2020-05-25 DIAGNOSIS — J9811 Atelectasis: Secondary | ICD-10-CM | POA: Diagnosis not present

## 2020-05-25 DIAGNOSIS — K5669 Other partial intestinal obstruction: Secondary | ICD-10-CM | POA: Diagnosis present

## 2020-05-25 DIAGNOSIS — M542 Cervicalgia: Secondary | ICD-10-CM | POA: Diagnosis present

## 2020-05-25 DIAGNOSIS — K449 Diaphragmatic hernia without obstruction or gangrene: Secondary | ICD-10-CM | POA: Diagnosis present

## 2020-05-25 DIAGNOSIS — G8929 Other chronic pain: Secondary | ICD-10-CM | POA: Diagnosis present

## 2020-05-25 DIAGNOSIS — Z5320 Procedure and treatment not carried out because of patient's decision for unspecified reasons: Secondary | ICD-10-CM | POA: Diagnosis not present

## 2020-05-25 DIAGNOSIS — Z20822 Contact with and (suspected) exposure to covid-19: Secondary | ICD-10-CM | POA: Diagnosis present

## 2020-05-25 DIAGNOSIS — M546 Pain in thoracic spine: Secondary | ICD-10-CM | POA: Diagnosis present

## 2020-05-25 DIAGNOSIS — Z638 Other specified problems related to primary support group: Secondary | ICD-10-CM | POA: Diagnosis not present

## 2020-05-25 DIAGNOSIS — J449 Chronic obstructive pulmonary disease, unspecified: Secondary | ICD-10-CM | POA: Diagnosis present

## 2020-05-25 DIAGNOSIS — K625 Hemorrhage of anus and rectum: Secondary | ICD-10-CM | POA: Diagnosis not present

## 2020-05-25 DIAGNOSIS — I700 Atherosclerosis of aorta: Secondary | ICD-10-CM | POA: Diagnosis present

## 2020-05-25 DIAGNOSIS — R0600 Dyspnea, unspecified: Secondary | ICD-10-CM | POA: Insufficient documentation

## 2020-05-25 DIAGNOSIS — K2990 Gastroduodenitis, unspecified, without bleeding: Secondary | ICD-10-CM | POA: Diagnosis present

## 2020-05-25 DIAGNOSIS — R062 Wheezing: Secondary | ICD-10-CM

## 2020-05-25 LAB — CBC WITH DIFFERENTIAL/PLATELET
Abs Immature Granulocytes: 0.13 10*3/uL — ABNORMAL HIGH (ref 0.00–0.07)
Basophils Absolute: 0 10*3/uL (ref 0.0–0.1)
Basophils Relative: 0 %
Eosinophils Absolute: 0.1 10*3/uL (ref 0.0–0.5)
Eosinophils Relative: 0 %
HCT: 21.3 % — ABNORMAL LOW (ref 39.0–52.0)
Hemoglobin: 5.5 g/dL — CL (ref 13.0–17.0)
Immature Granulocytes: 1 %
Lymphocytes Relative: 11 %
Lymphs Abs: 1.2 10*3/uL (ref 0.7–4.0)
MCH: 19.9 pg — ABNORMAL LOW (ref 26.0–34.0)
MCHC: 25.8 g/dL — ABNORMAL LOW (ref 30.0–36.0)
MCV: 77.2 fL — ABNORMAL LOW (ref 80.0–100.0)
Monocytes Absolute: 1.6 10*3/uL — ABNORMAL HIGH (ref 0.1–1.0)
Monocytes Relative: 13 %
Neutro Abs: 8.7 10*3/uL — ABNORMAL HIGH (ref 1.7–7.7)
Neutrophils Relative %: 75 %
Platelets: 424 10*3/uL — ABNORMAL HIGH (ref 150–400)
RBC: 2.76 MIL/uL — ABNORMAL LOW (ref 4.22–5.81)
RDW: 22.4 % — ABNORMAL HIGH (ref 11.5–15.5)
WBC: 11.7 10*3/uL — ABNORMAL HIGH (ref 4.0–10.5)
nRBC: 0.3 % — ABNORMAL HIGH (ref 0.0–0.2)

## 2020-05-25 LAB — I-STAT VENOUS BLOOD GAS, ED
Acid-base deficit: 5 mmol/L — ABNORMAL HIGH (ref 0.0–2.0)
Bicarbonate: 19.4 mmol/L — ABNORMAL LOW (ref 20.0–28.0)
Calcium, Ion: 1.07 mmol/L — ABNORMAL LOW (ref 1.15–1.40)
HCT: 19 % — ABNORMAL LOW (ref 39.0–52.0)
Hemoglobin: 6.5 g/dL — CL (ref 13.0–17.0)
O2 Saturation: 97 %
Potassium: 3.9 mmol/L (ref 3.5–5.1)
Sodium: 127 mmol/L — ABNORMAL LOW (ref 135–145)
TCO2: 20 mmol/L — ABNORMAL LOW (ref 22–32)
pCO2, Ven: 31.9 mmHg — ABNORMAL LOW (ref 44.0–60.0)
pH, Ven: 7.391 (ref 7.250–7.430)
pO2, Ven: 86 mmHg — ABNORMAL HIGH (ref 32.0–45.0)

## 2020-05-25 LAB — HEPATIC FUNCTION PANEL
ALT: 13 U/L (ref 0–44)
AST: 17 U/L (ref 15–41)
Albumin: 2.4 g/dL — ABNORMAL LOW (ref 3.5–5.0)
Alkaline Phosphatase: 98 U/L (ref 38–126)
Bilirubin, Direct: 0.2 mg/dL (ref 0.0–0.2)
Indirect Bilirubin: 1.9 mg/dL — ABNORMAL HIGH (ref 0.3–0.9)
Total Bilirubin: 2.1 mg/dL — ABNORMAL HIGH (ref 0.3–1.2)
Total Protein: 6.7 g/dL (ref 6.5–8.1)

## 2020-05-25 LAB — BASIC METABOLIC PANEL
Anion gap: 14 (ref 5–15)
BUN: 5 mg/dL — ABNORMAL LOW (ref 8–23)
CO2: 16 mmol/L — ABNORMAL LOW (ref 22–32)
Calcium: 8.1 mg/dL — ABNORMAL LOW (ref 8.9–10.3)
Chloride: 95 mmol/L — ABNORMAL LOW (ref 98–111)
Creatinine, Ser: 0.7 mg/dL (ref 0.61–1.24)
GFR, Estimated: >60 mL/min (ref >60)
Glucose, Bld: 135 mg/dL — ABNORMAL HIGH (ref 70–99)
Potassium: 3.8 mmol/L (ref 3.5–5.1)
Sodium: 125 mmol/L — ABNORMAL LOW (ref 135–145)

## 2020-05-25 LAB — PREPARE RBC (CROSSMATCH)

## 2020-05-25 LAB — ABO/RH: ABO/RH(D): B POS

## 2020-05-25 LAB — LACTIC ACID, PLASMA
Lactic Acid, Venous: 5.3 mmol/L (ref 0.5–1.9)
Lactic Acid, Venous: 1.9 mmol/L (ref 0.5–1.9)

## 2020-05-25 LAB — TROPONIN I (HIGH SENSITIVITY)
Troponin I (High Sensitivity): 2 ng/L (ref <18)
Troponin I (High Sensitivity): <2 ng/L (ref <18)

## 2020-05-25 LAB — POC OCCULT BLOOD, ED: Fecal Occult Bld: POSITIVE — AB

## 2020-05-25 LAB — ETHANOL: Alcohol, Ethyl (B): <10 mg/dL (ref <10)

## 2020-05-25 IMAGING — CT CT ANGIO CHEST-ABD-PELV FOR DISSECTION W/ AND WO/W CM
2 of 7 series · 13 of 46 positions shown, 15 images · non-contrast
Comparison: CT [DATE]

CLINICAL DATA: 64-year-old with chest pain. Evaluate for an aortic
dissection.

EXAM:
CT ANGIOGRAPHY CHEST, ABDOMEN AND PELVIS
TECHNIQUE: Non-contrast CT of the chest was initially obtained.

[Series 7: dissection 2mm · axial · 0.82mm/px · z∈[-846,-206]mm · 10 of 360 slices shown, 12 images]
[im 20/360  soft-tissue]
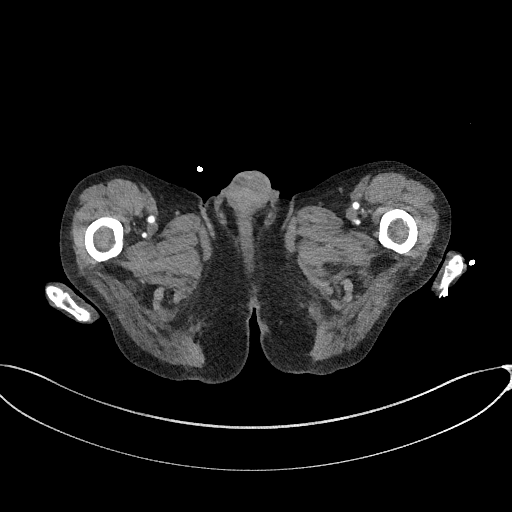
[im 20/360  bone]
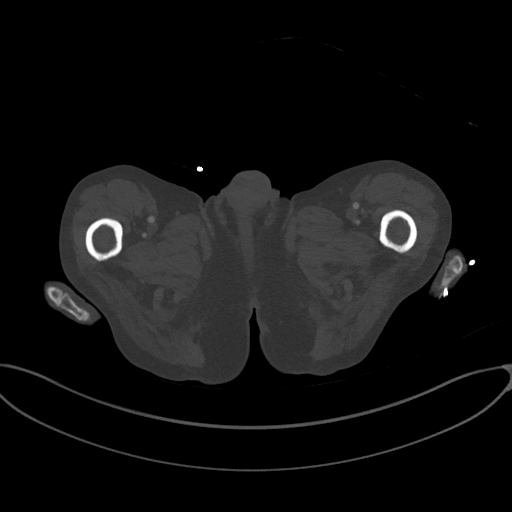
[im 60/360  soft-tissue]
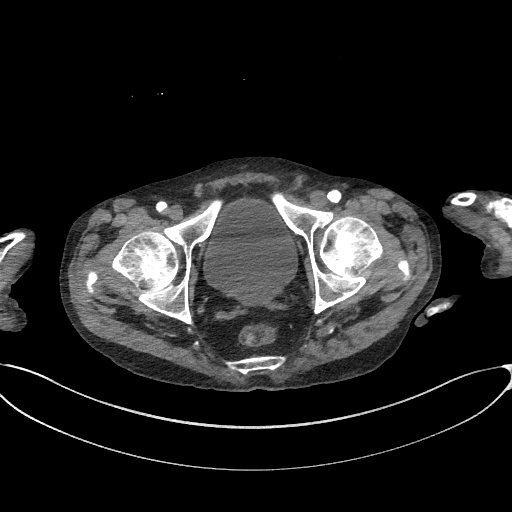
[im 100/360  soft-tissue]
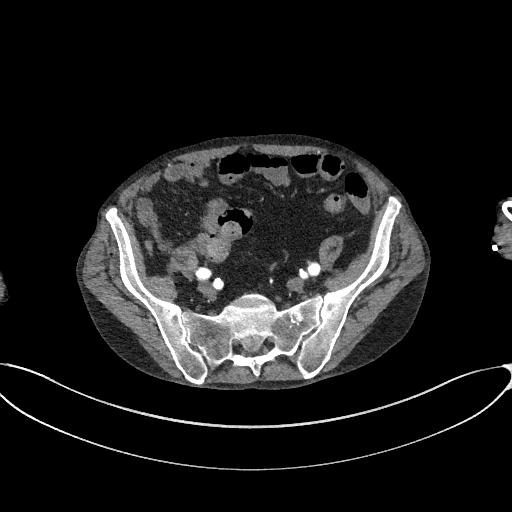
[im 120/360  soft-tissue]
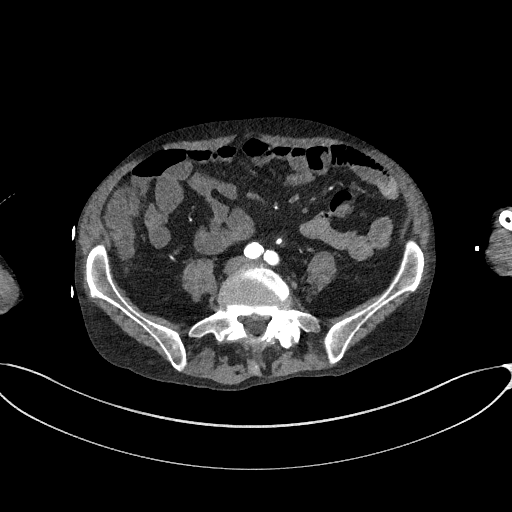
[im 160/360  soft-tissue]
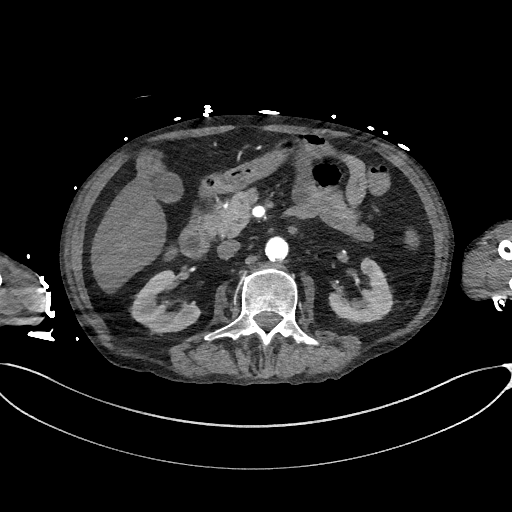
[im 200/360  soft-tissue]
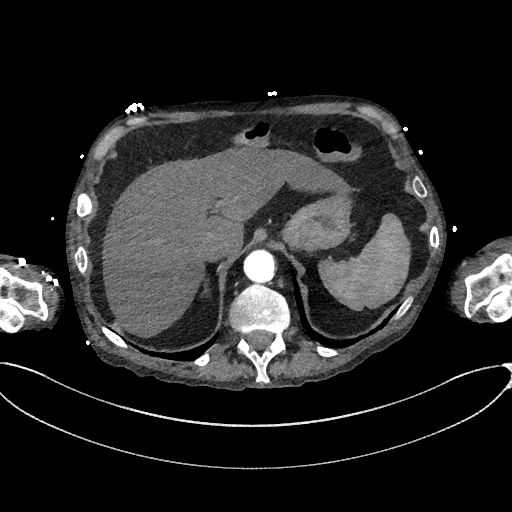
[im 240/360  soft-tissue]
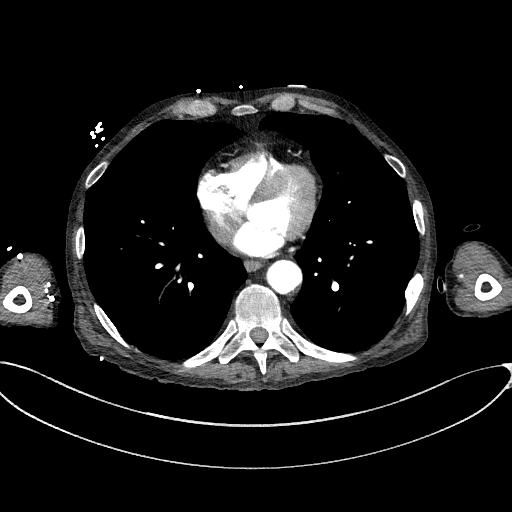
[im 260/360  soft-tissue]
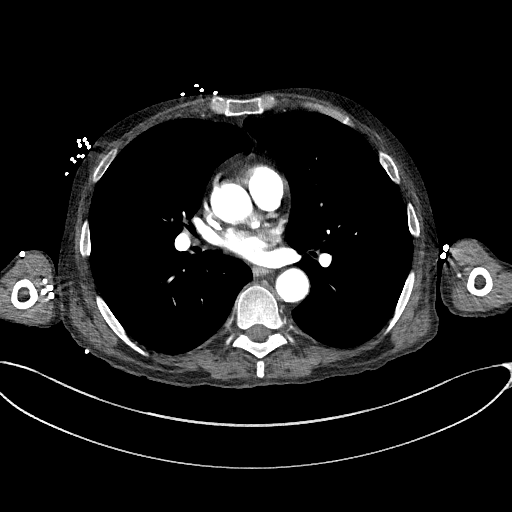
[im 300/360  soft-tissue]
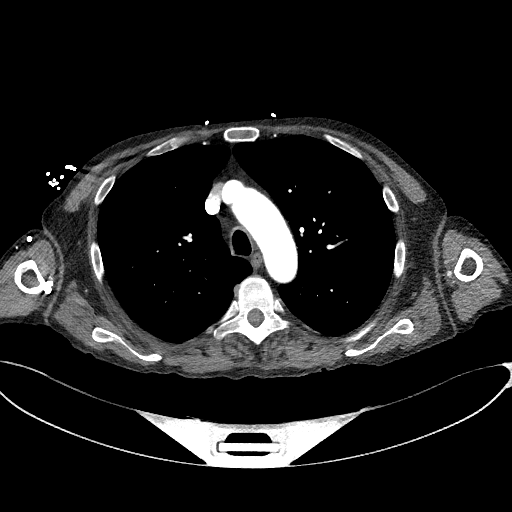
[im 300/360  bone]
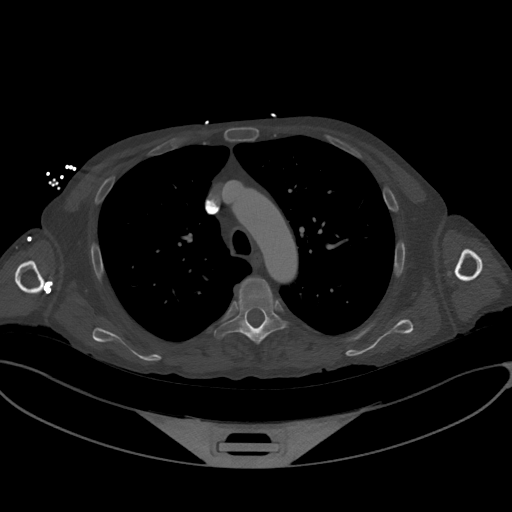
[im 340/360  soft-tissue]
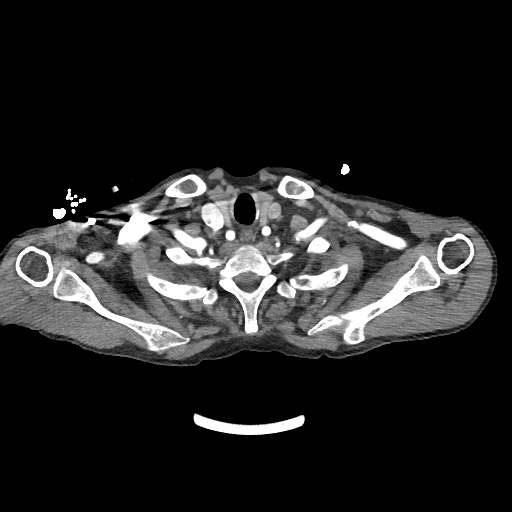

[Series 10: dissection 2mm cor · coronal · 0.79mm/px · 3 of 147 slices shown]
[im 37/147  soft-tissue]
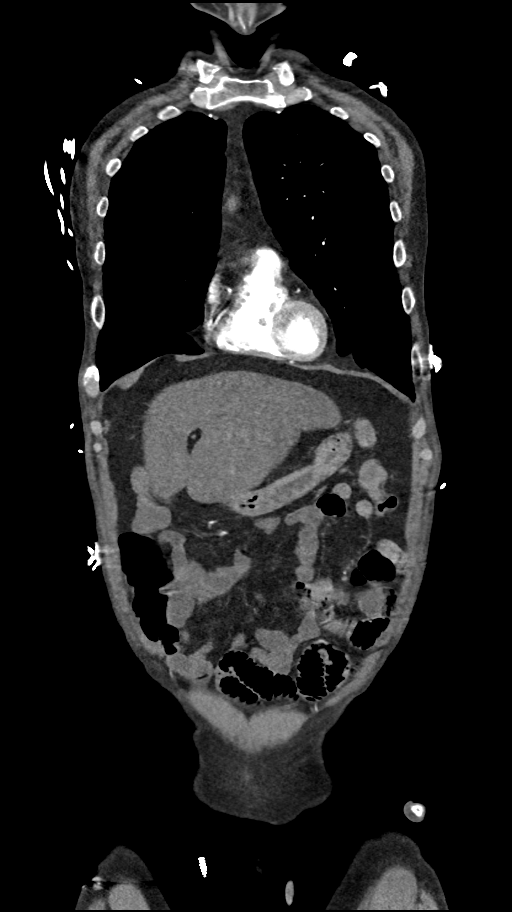
[im 74/147  soft-tissue]
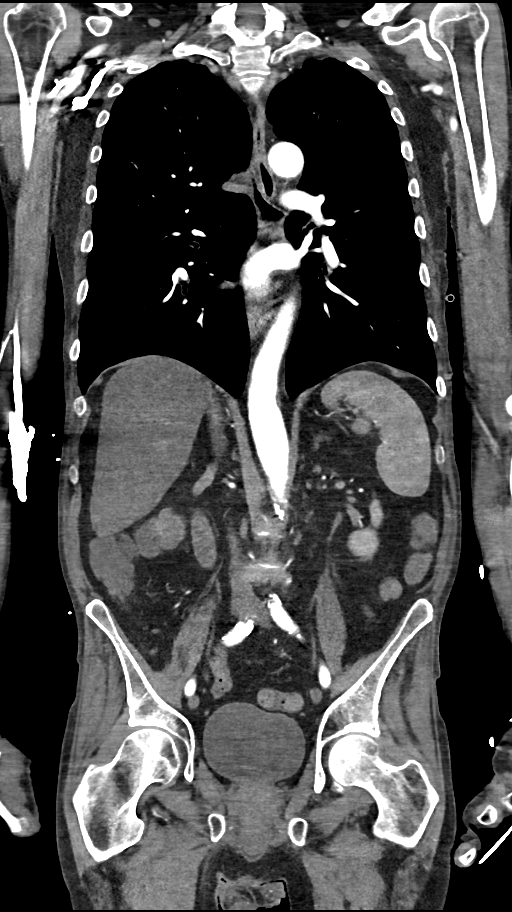
[im 110/147  soft-tissue]
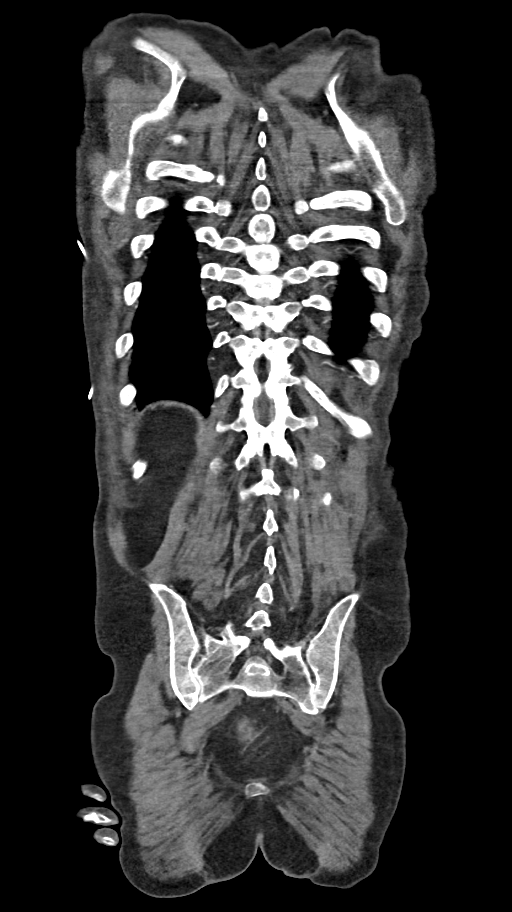

[13 of 46 positions shown; findings below may reference images not displayed]

Multidetector CT imaging through the chest, abdomen and pelvis was
performed using the standard protocol during bolus administration of
intravenous contrast. Multiplanar reconstructed images and MIPs were
obtained and reviewed to evaluate the vascular anatomy.

CONTRAST:  100mL OMNIPAQUE IOHEXOL 350 MG/ML SOLN
FINDINGS: CTA CHEST FINDINGS

Cardiovascular: Aortic root at the sinuses of Valsalva measures
cm. Ascending thoracic aorta measures 3.2 cm without dissection.
Typical three-vessel arch anatomy. Great vessels are patent.
Proximal descending thoracic aorta measures 2.8 cm. Distal
descending thoracic aorta measures 2.5 cm.Main pulmonary arteries
are patent without large filling defects. Heart size is normal. No
significant pericardial effusion.

Mediastinum/Nodes: Small hiatal hernia. No significant chest
lymphadenopathy.

Lungs/Pleura: Scarring at the lung apices. Small amount of debris or
mucus in the trachea. No large pleural effusions. Stable punctate
nodule in the superior segment of the left lower lobe on sequence 8
image 47. No significant airspace disease or consolidation in the
lungs. Negative for pneumothorax. Few patchy densities in the right
upper lobe near the right minor fissure are suggestive for
atelectasis or post inflammatory changes based on the configuration,
sequence 6, image 37.

Musculoskeletal: Old bilateral rib fractures. No acute bone
abnormality.

Review of the MIP images confirms the above findings.

CTA ABDOMEN AND PELVIS FINDINGS

VASCULAR

Aorta: Atherosclerotic disease in the abdominal aorta without
aneurysm, dissection or significant stenosis.

Celiac: Celiac trunk is widely patent. Splenic artery and left
gastric artery are patent. Gastroduodenal artery originates directly
from the celiac trunk. Proper hepatic artery comes off the SMA.

SMA: SMA is widely patent.

Renals: Both renal arteries are patent without evidence of aneurysm,
dissection, vasculitis, fibromuscular dysplasia or significant
stenosis.

IMA: Patent

Inflow: Atherosclerotic plaque in the iliac arteries without
significant stenosis. Common, internal and external iliac arteries
are patent bilaterally. Close to 50% narrowing in the proximal left
common femoral artery. Proximal femoral arteries are patent
bilaterally.

Veins: No obvious venous abnormality within the limitations of this
arterial phase study.

Review of the MIP images confirms the above findings.

NON-VASCULAR

Hepatobiliary: No appearance of the gallbladder. No acute
abnormality to the liver. No significant biliary dilatation.

Pancreas: Unremarkable. No pancreatic ductal dilatation or
surrounding inflammatory changes.

Spleen: Normal in size without focal abnormality.

Adrenals/Urinary Tract: Normal appearance of the adrenal glands.
Normal appearance of both kidneys without hydronephrosis. Normal
appearance of the urinary bladder.

Stomach/Bowel: Stomach is within normal limits. Appendix appears
normal. No evidence of bowel wall thickening, distention, or
inflammatory changes. Small hiatal hernia.

Lymphatic: No lymph node enlargement in the abdomen or pelvis.

Reproductive: Stable appearance of the prostate and seminal
vesicles.

Other: Negative for ascites.  Negative for free air.

Musculoskeletal: No acute bone abnormality.

Review of the MIP images confirms the above findings.
IMPRESSION: 1. No acute abnormality in the chest, abdomen or pelvis.
Specifically, negative for an acute aortic injury or dissection.
2. Main visceral arteries are patent. No evidence for mesenteric
ischemia. No acute bowel abnormality.
3. Old bilateral rib fractures.
4. New small densities in the anterior right upper lobe near the
right minor fissure. Suspect these are postinflammatory or
atelectasis.
5.  Aortic Atherosclerosis ([KA]-[KA]).

## 2020-05-25 IMAGING — CT CT CERVICAL SPINE W/O CM
3 of 4 series · 13 of 33 positions shown, 16 images · non-contrast
Comparison: CT head/maxillofacial [DATE].

CLINICAL DATA: Head trauma, minor. Neck trauma. Dizziness and
lightheadedness for 1 week, spine pain radiating to neck.

EXAM:
CT HEAD WITHOUT CONTRAST
CT CERVICAL SPINE WITHOUT CONTRAST
TECHNIQUE: Multidetector CT imaging of the head and cervical spine was
performed following the standard protocol without intravenous
contrast. Multiplanar CT image reconstructions of the cervical spine
were also generated.

[Series 5: c_spine 2.0 st · axial · 0.35mm/px · z∈[-234,-110]mm · 5 of 94 slices shown, 7 images]
[im 16/94  soft-tissue]
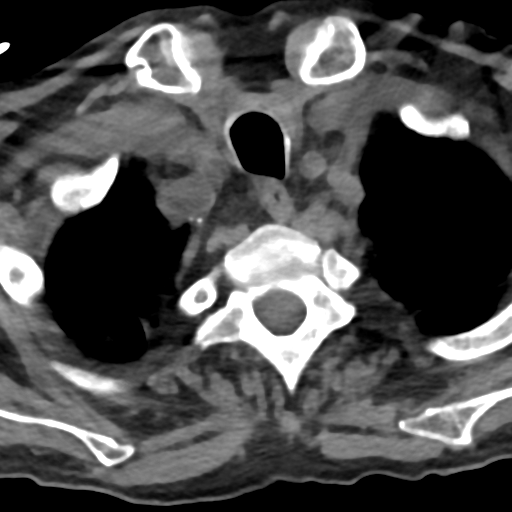
[im 16/94  bone]
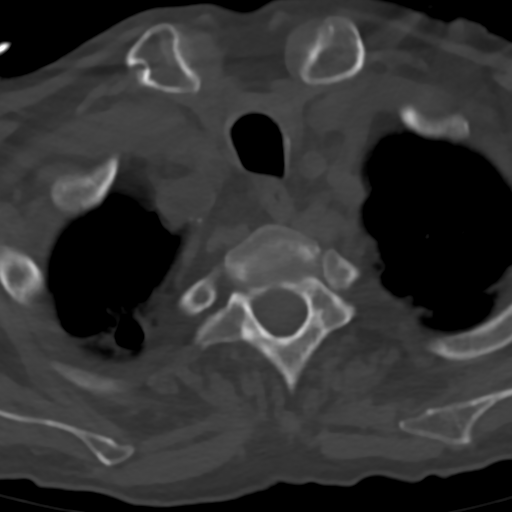
[im 32/94  bone]
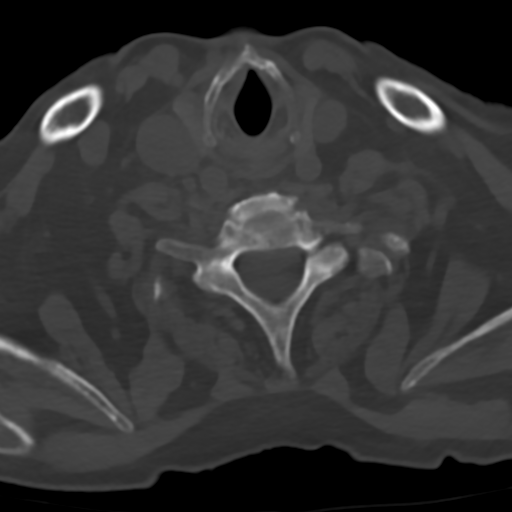
[im 47/94  bone]
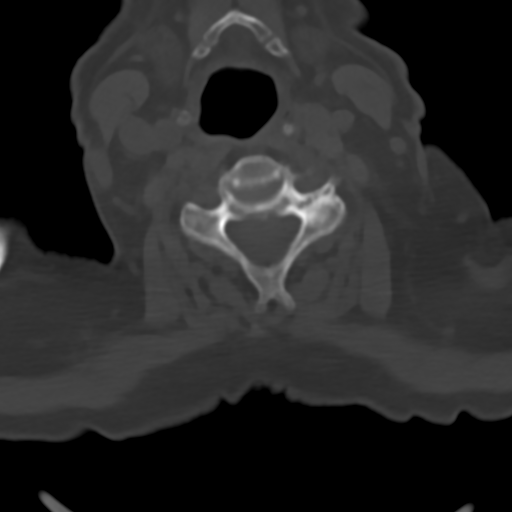
[im 63/94  bone]
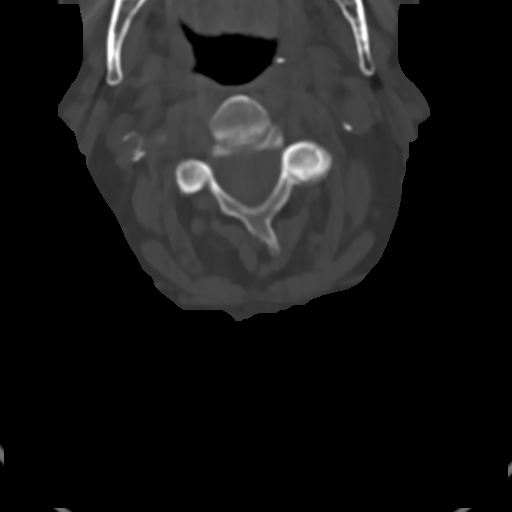
[im 78/94  soft-tissue]
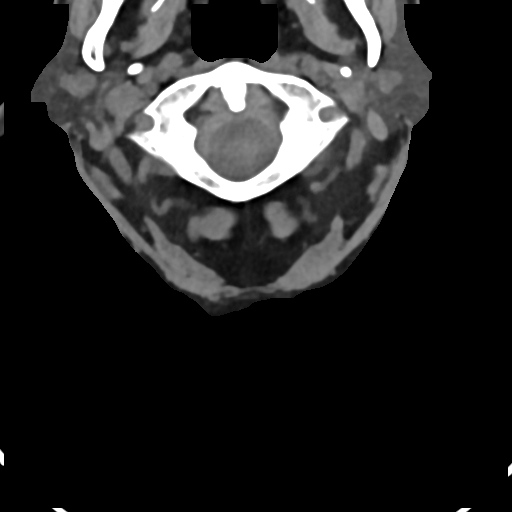
[im 78/94  bone]
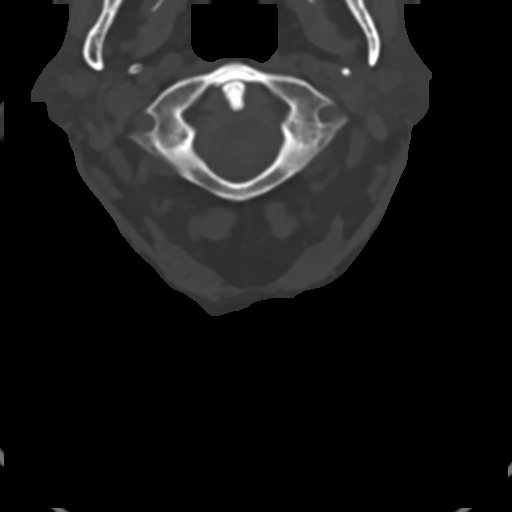

[Series 9: c_spine 2.0 sag bone · sagittal · 0.36mm/px · 5 of 61 slices shown, 6 images]
[im 21/61  bone]
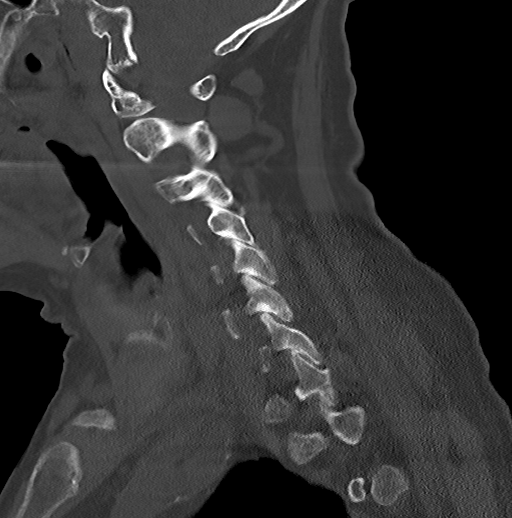
[im 26/61  bone]
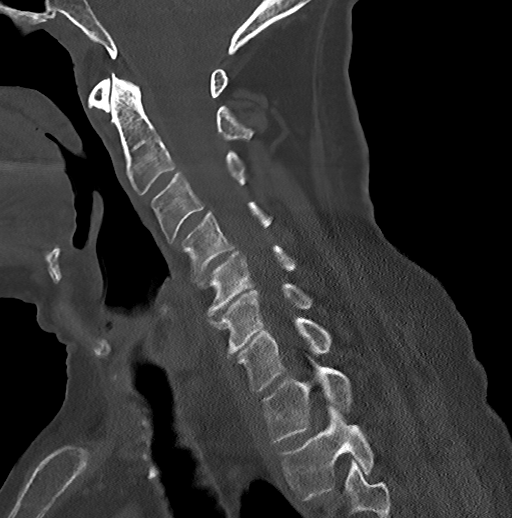
[im 31/61  soft-tissue]
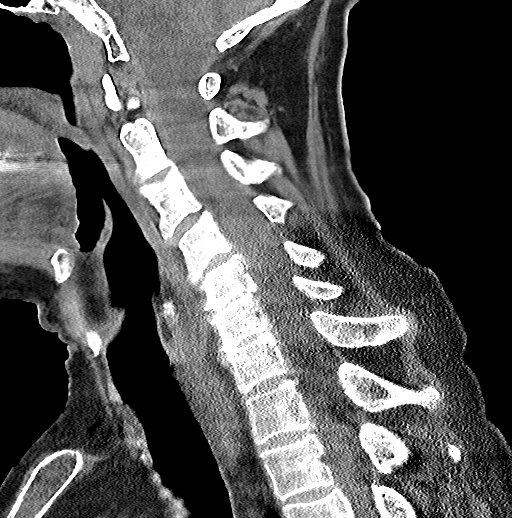
[im 31/61  bone]
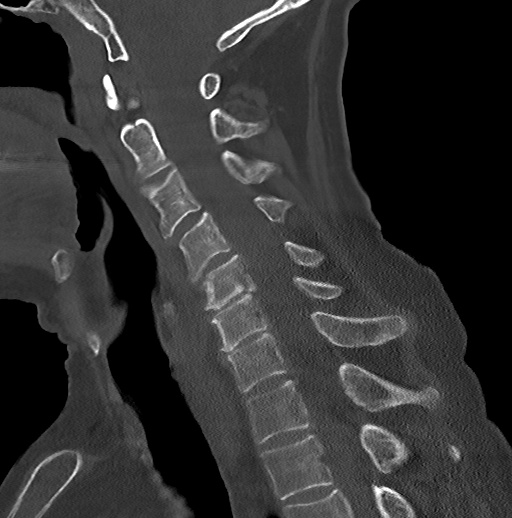
[im 36/61  bone]
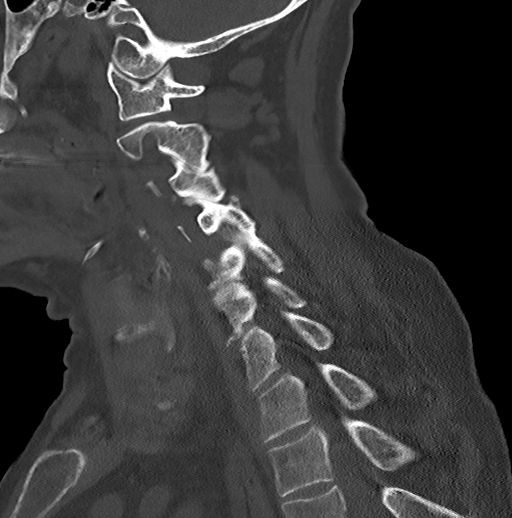
[im 41/61  bone]
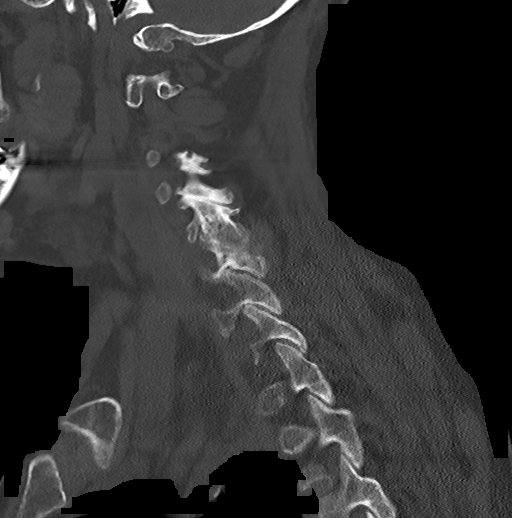

[Series 10: c_spine 2.0 cor bone · coronal · 0.23mm/px · 3 of 57 slices shown]
[im 12/57  bone]
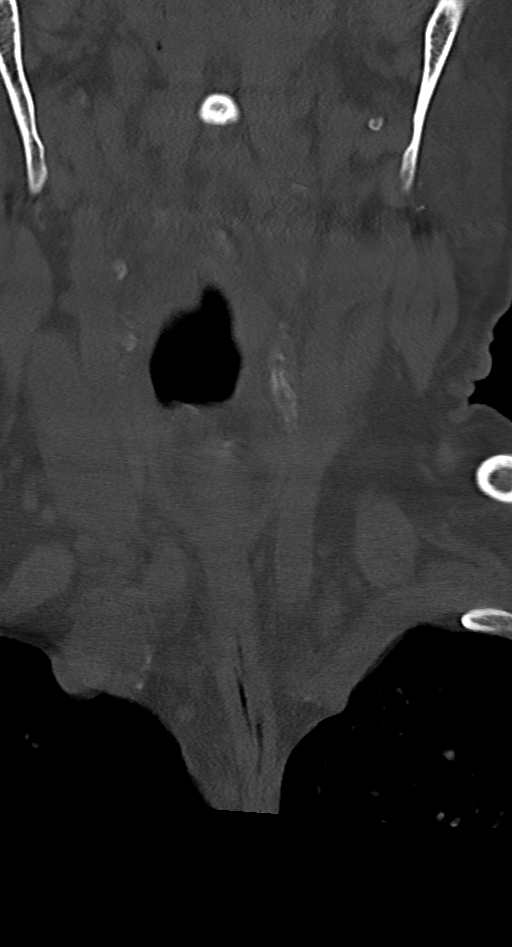
[im 23/57  bone]
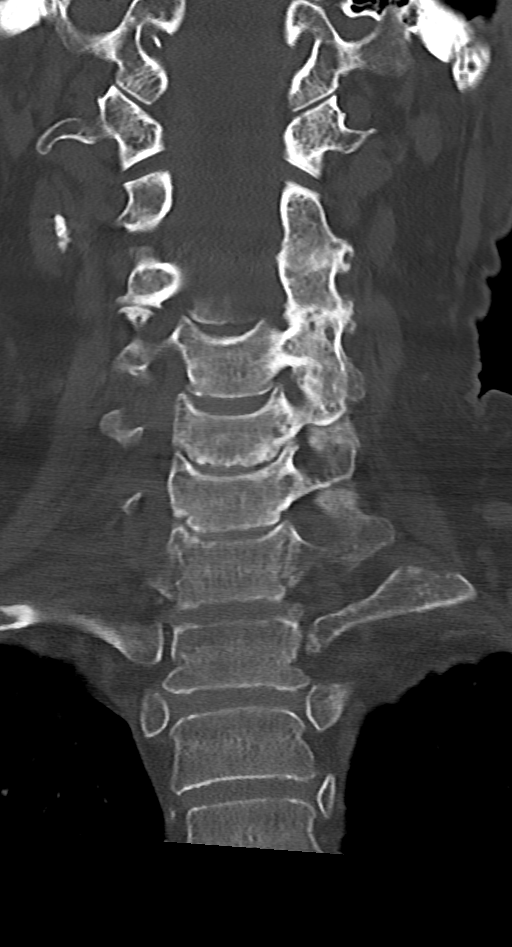
[im 34/57  bone]
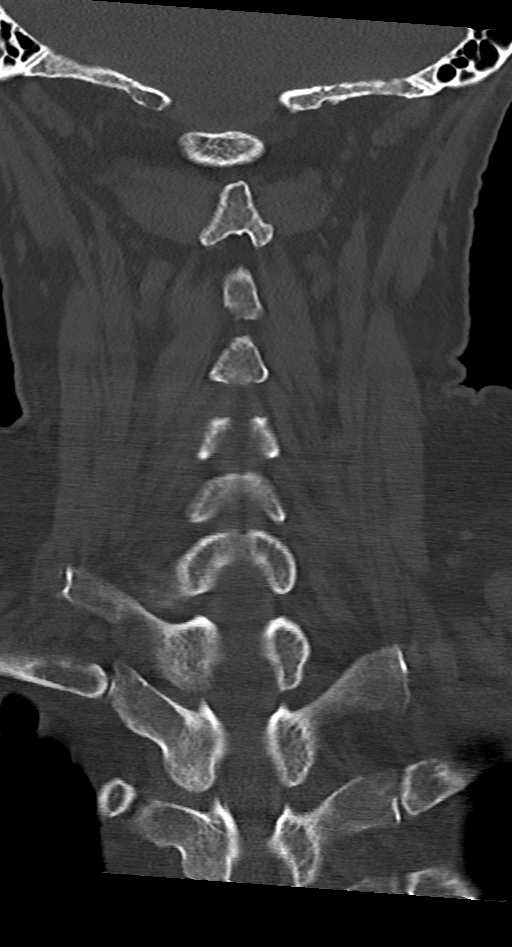

[13 of 33 positions shown; findings below may reference images not displayed]

FINDINGS: CT HEAD FINDINGS

Brain:

Mild-to-moderate cerebral atrophy.

Minimal patchy and ill-defined hypoattenuation within the cerebral
white matter is nonspecific, but compatible with chronic small
vessel ischemic disease.

There is no acute intracranial hemorrhage.

No demarcated cortical infarct.

No extra-axial fluid collection.

No evidence of intracranial mass.

No midline shift.

Vascular: No hyperdense vessel.  Atherosclerotic calcifications

Skull: Normal. Negative for fracture or focal lesion.

Sinuses/Orbits: Visualized orbits show no acute finding. Mucous
retention cysts within the posterior right ethmoid air cell and
within the right sphenoid sinus. There is otherwise only trace
scattered paranasal sinus mucosal thickening at the imaged levels.

CT CERVICAL SPINE FINDINGS

Mildly motion degraded examination.

Alignment: Reversal of the expected cervical lordosis. 1-2 mm C3-C4
and C4-C5 grade 1 anterolisthesis. 1-2 mm C5-C6 grade 1
retrolisthesis.

Skull base and vertebrae: The basion-dental and atlanto-dental
intervals are maintained.No evidence of acute fracture to the
cervical spine.

Soft tissues and spinal canal: No prevertebral fluid or swelling. No
visible canal hematoma.

Disc levels: Cervical spondylosis with shallow multilevel disc
bulges/central disc protrusions, uncovertebral hypertrophy and facet
arthrosis. Facet joint ankylosis on the left at C4-C5. Disc space
narrowing is greatest at C5-C6 and C6-C7 (advanced at these levels).
No appreciable high-grade spinal canal stenosis. No high-grade bony
neural foraminal narrowing.

Upper chest: No consolidation within the imaged lung apices. No
visible pneumothorax. Right apical bullae.
IMPRESSION: CT head:

1. No evidence of acute intracranial abnormality.
2. Mild-to-moderate cerebral atrophy.
3. Mild cerebral white matter chronic small vessel ischemic disease.
4. Mild paranasal sinus disease as described.

CT cervical spine:

1. Mildly motion degraded exam.
2. No evidence of acute fracture to the cervical spine.
3. Nonspecific reversal of the expected cervical lordosis.
4. Mild grade 1 spondylolisthesis at C3-C4, C4-C5 and C5-C6 as
detailed.
5. Cervical spondylosis as described.

## 2020-05-25 IMAGING — CT CT HEAD W/O CM
4 series · 15 of 47 positions shown, 17 images · non-contrast
Comparison: CT head/maxillofacial [DATE].

CLINICAL DATA: Head trauma, minor. Neck trauma. Dizziness and
lightheadedness for 1 week, spine pain radiating to neck.

EXAM:
CT HEAD WITHOUT CONTRAST
CT CERVICAL SPINE WITHOUT CONTRAST
TECHNIQUE: Multidetector CT imaging of the head and cervical spine was
performed following the standard protocol without intravenous
contrast. Multiplanar CT image reconstructions of the cervical spine
were also generated.

[Series 3: head without · axial · non-contrast · 0.45mm/px · z∈[-64,+56]mm · 7 of 32 slices shown, 9 images]
[im 4/32  brain]
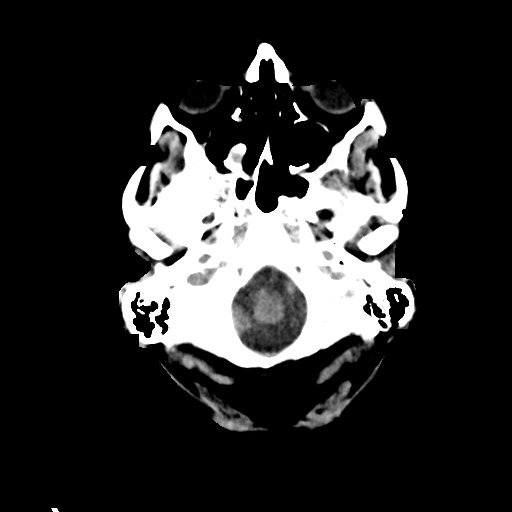
[im 4/32  bone]
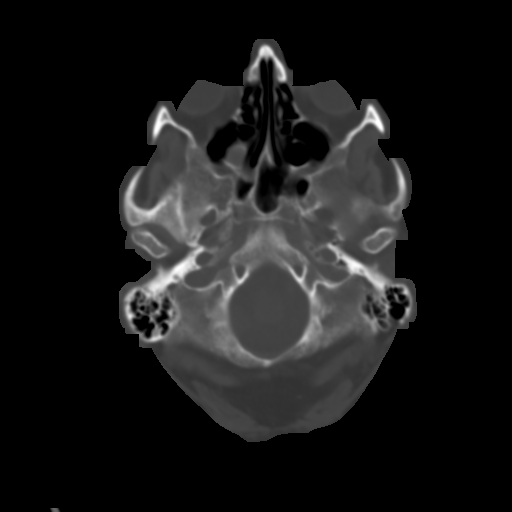
[im 8/32  brain]
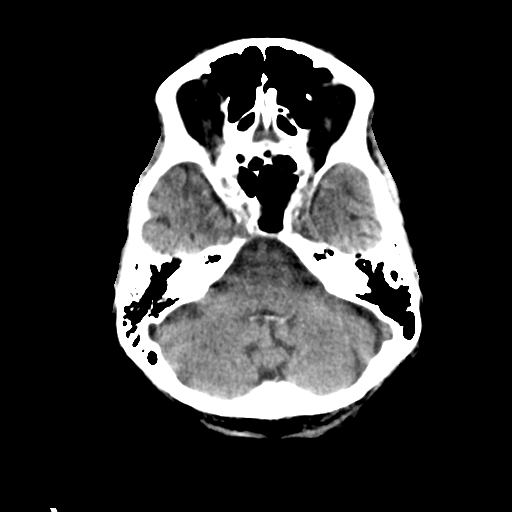
[im 12/32  brain]
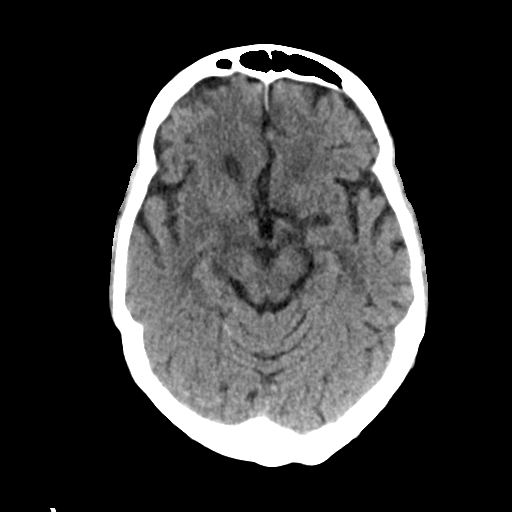
[im 16/32  brain]
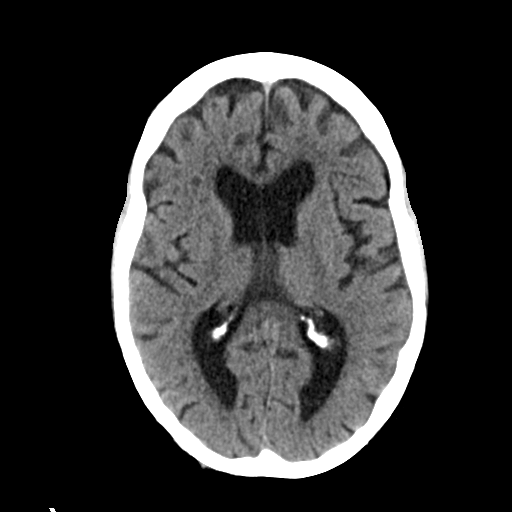
[im 20/32  brain]
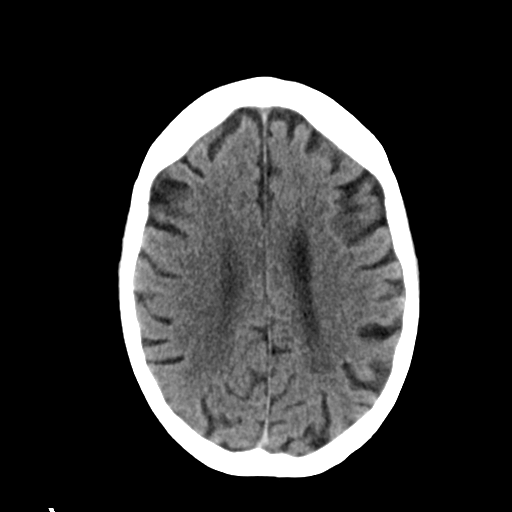
[im 20/32  bone]
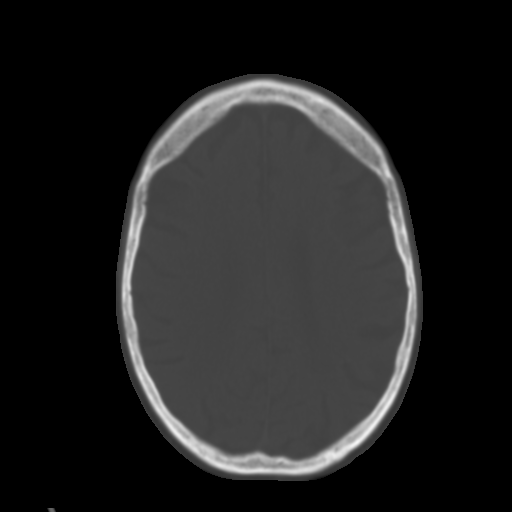
[im 24/32  brain]
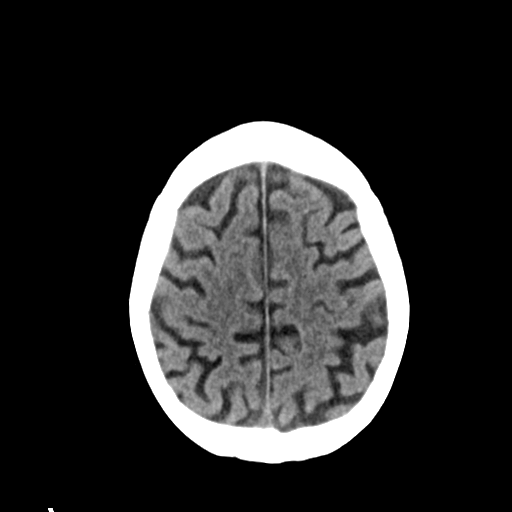
[im 28/32  brain]
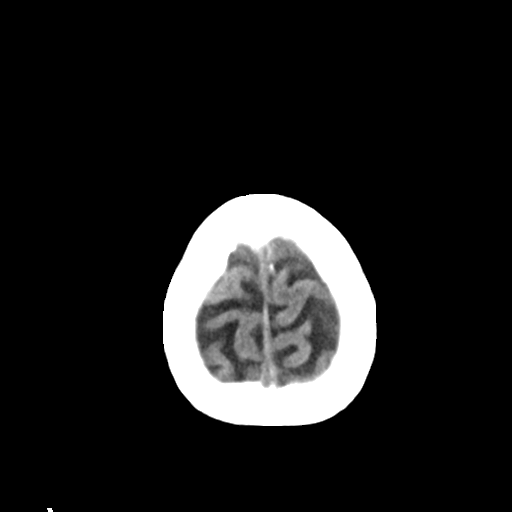

[Series 4: head bone · axial · 0.45mm/px · z∈[-65,-49]mm · 2 of 79 slices shown]
[im 8/79  bone]
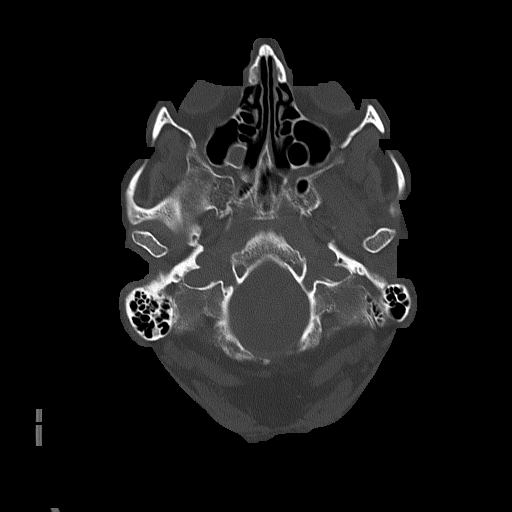
[im 16/79  bone]
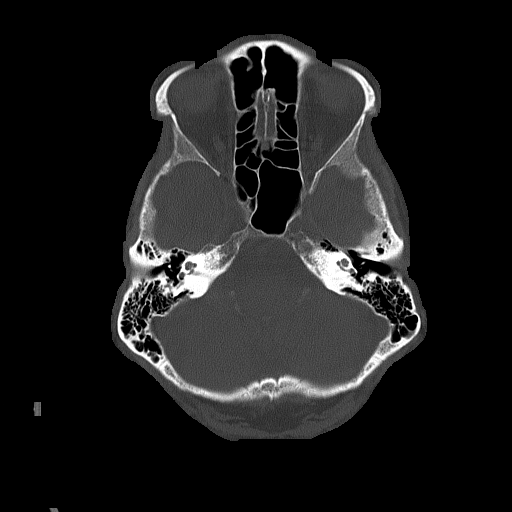

[Series 5: head without cor · coronal · non-contrast · 0.34mm/px · 3 of 72 slices shown]
[im 24/72  brain]
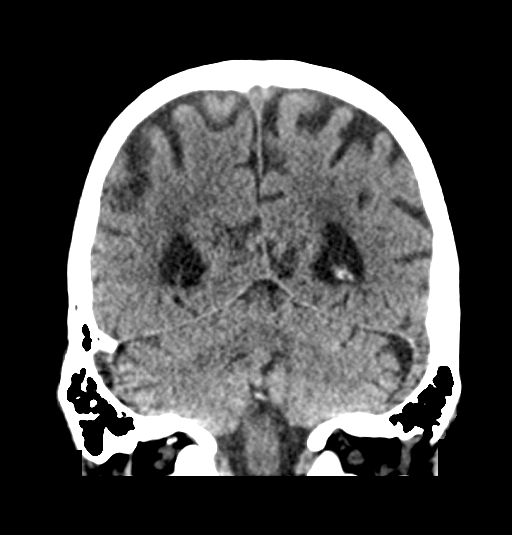
[im 32/72  brain]
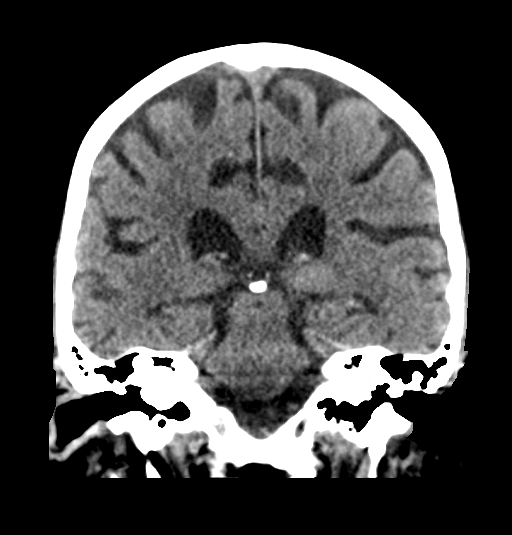
[im 40/72  brain]
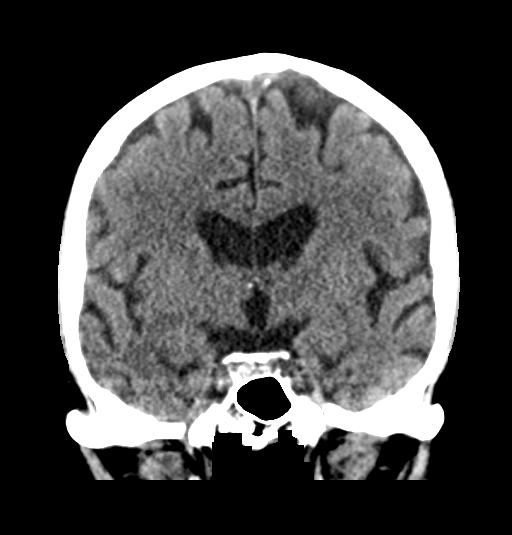

[Series 6: head without sag · sagittal · non-contrast · 0.33mm/px · 3 of 56 slices shown]
[im 19/56  brain]
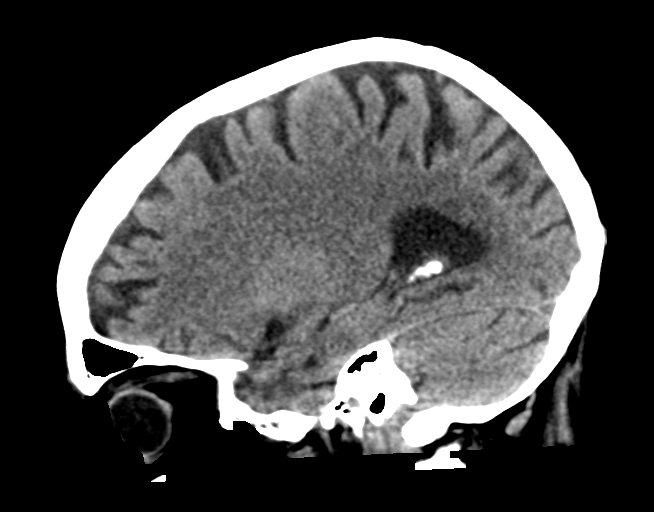
[im 28/56  brain]
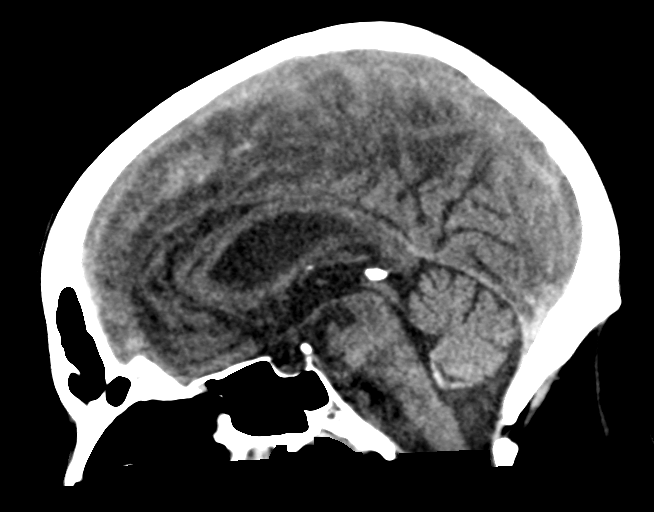
[im 37/56  brain]
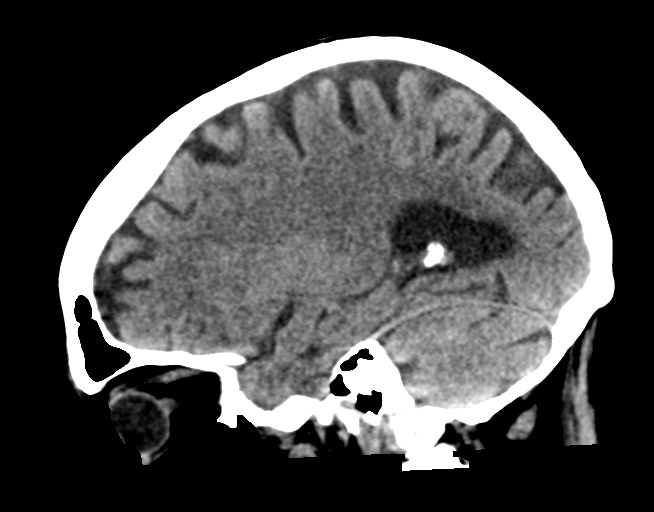

[15 of 47 positions shown; findings below may reference images not displayed]

FINDINGS: CT HEAD FINDINGS

Brain:

Mild-to-moderate cerebral atrophy.

Minimal patchy and ill-defined hypoattenuation within the cerebral
white matter is nonspecific, but compatible with chronic small
vessel ischemic disease.

There is no acute intracranial hemorrhage.

No demarcated cortical infarct.

No extra-axial fluid collection.

No evidence of intracranial mass.

No midline shift.

Vascular: No hyperdense vessel.  Atherosclerotic calcifications

Skull: Normal. Negative for fracture or focal lesion.

Sinuses/Orbits: Visualized orbits show no acute finding. Mucous
retention cysts within the posterior right ethmoid air cell and
within the right sphenoid sinus. There is otherwise only trace
scattered paranasal sinus mucosal thickening at the imaged levels.

CT CERVICAL SPINE FINDINGS

Mildly motion degraded examination.

Alignment: Reversal of the expected cervical lordosis. 1-2 mm C3-C4
and C4-C5 grade 1 anterolisthesis. 1-2 mm C5-C6 grade 1
retrolisthesis.

Skull base and vertebrae: The basion-dental and atlanto-dental
intervals are maintained.No evidence of acute fracture to the
cervical spine.

Soft tissues and spinal canal: No prevertebral fluid or swelling. No
visible canal hematoma.

Disc levels: Cervical spondylosis with shallow multilevel disc
bulges/central disc protrusions, uncovertebral hypertrophy and facet
arthrosis. Facet joint ankylosis on the left at C4-C5. Disc space
narrowing is greatest at C5-C6 and C6-C7 (advanced at these levels).
No appreciable high-grade spinal canal stenosis. No high-grade bony
neural foraminal narrowing.

Upper chest: No consolidation within the imaged lung apices. No
visible pneumothorax. Right apical bullae.
IMPRESSION: CT head:

1. No evidence of acute intracranial abnormality.
2. Mild-to-moderate cerebral atrophy.
3. Mild cerebral white matter chronic small vessel ischemic disease.
4. Mild paranasal sinus disease as described.

CT cervical spine:

1. Mildly motion degraded exam.
2. No evidence of acute fracture to the cervical spine.
3. Nonspecific reversal of the expected cervical lordosis.
4. Mild grade 1 spondylolisthesis at C3-C4, C4-C5 and C5-C6 as
detailed.
5. Cervical spondylosis as described.

## 2020-05-25 IMAGING — DX DG CHEST 1V PORT
1 series · 1 of 1 positions shown · non-contrast
Comparison: [DATE]

CLINICAL DATA: Chest pain

EXAM:
PORTABLE CHEST 1 VIEW

[chest ap]
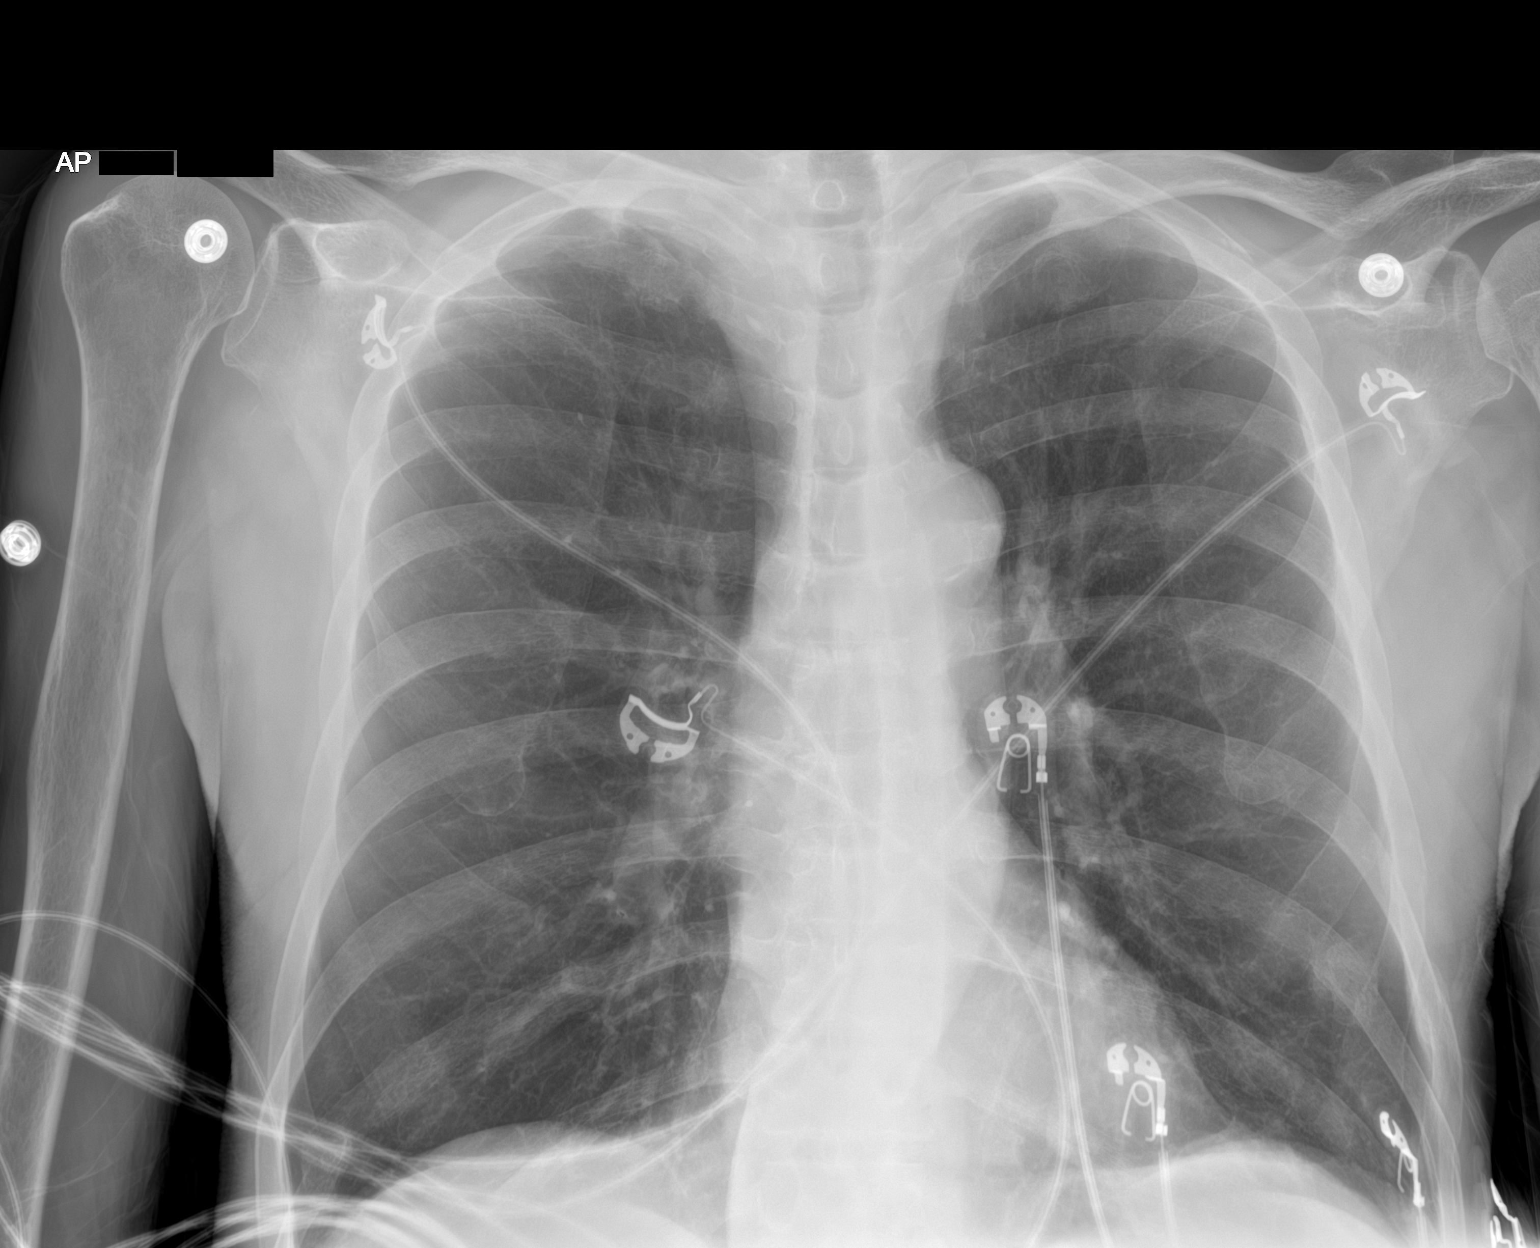

[1 of 1 positions shown; findings below may reference images not displayed]

FINDINGS: The heart size and mediastinal contours are within normal limits.
Emphysema. Both lungs are clear. No pleural effusion or
pneumothorax. Chronic bilateral rib fractures.
IMPRESSION: No acute process in the chest.

## 2020-05-25 MED ORDER — LORAZEPAM 1 MG PO TABS
0.0000 mg | ORAL_TABLET | Freq: Two times a day (BID) | ORAL | Status: AC
  Administered 2020-05-27 – 2020-05-28 (×2): 1 mg via ORAL
  Administered 2020-05-28: 2 mg via ORAL
  Administered 2020-05-29: 1 mg via ORAL
  Filled 2020-05-25: qty 1
  Filled 2020-05-25: qty 2
  Filled 2020-05-25 (×2): qty 1

## 2020-05-25 MED ORDER — ACETAMINOPHEN 325 MG PO TABS
650.0000 mg | ORAL_TABLET | Freq: Four times a day (QID) | ORAL | Status: DC | PRN
  Administered 2020-05-27: 650 mg via ORAL
  Filled 2020-05-25: qty 2

## 2020-05-25 MED ORDER — IPRATROPIUM-ALBUTEROL 0.5-2.5 (3) MG/3ML IN SOLN
3.0000 mL | RESPIRATORY_TRACT | Status: DC | PRN
  Administered 2020-05-29 – 2020-06-15 (×3): 3 mL via RESPIRATORY_TRACT
  Filled 2020-05-25 (×3): qty 3

## 2020-05-25 MED ORDER — LORAZEPAM 2 MG/ML IJ SOLN: 0.0000 mg | Freq: Four times a day (QID) | INTRAMUSCULAR | Status: AC

## 2020-05-25 MED ORDER — BISACODYL 5 MG PO TBEC: 20.0000 mg | DELAYED_RELEASE_TABLET | Freq: Once | ORAL | Status: DC

## 2020-05-25 MED ORDER — PEG-KCL-NACL-NASULF-NA ASC-C 100 G PO SOLR
0.5000 | Freq: Once | ORAL | Status: AC
  Administered 2020-05-26: 100 g via ORAL
  Filled 2020-05-25: qty 1

## 2020-05-25 MED ORDER — SODIUM CHLORIDE 0.9 % IV SOLN
80.0000 mg | Freq: Once | INTRAVENOUS | Status: AC
  Administered 2020-05-25: 80 mg via INTRAVENOUS
  Filled 2020-05-25: qty 80

## 2020-05-25 MED ORDER — IOHEXOL 350 MG/ML SOLN
100.0000 mL | Freq: Once | INTRAVENOUS | Status: AC | PRN
  Administered 2020-05-25: 100 mL via INTRAVENOUS

## 2020-05-25 MED ORDER — THIAMINE HCL 100 MG PO TABS
100.0000 mg | ORAL_TABLET | Freq: Every day | ORAL | Status: DC
  Administered 2020-05-25 – 2020-06-11 (×11): 100 mg via ORAL
  Filled 2020-05-25 (×12): qty 1

## 2020-05-25 MED ORDER — SODIUM CHLORIDE 0.9 % IV SOLN: 10.0000 mL/h | Freq: Once | INTRAVENOUS | Status: DC

## 2020-05-25 MED ORDER — PEG-KCL-NACL-NASULF-NA ASC-C 100 G PO SOLR: 1.0000 | Freq: Once | ORAL | Status: DC

## 2020-05-25 MED ORDER — ACETAMINOPHEN 650 MG RE SUPP: 650.0000 mg | Freq: Four times a day (QID) | RECTAL | Status: DC | PRN

## 2020-05-25 MED ORDER — FENTANYL CITRATE (PF) 100 MCG/2ML IJ SOLN
50.0000 ug | Freq: Once | INTRAMUSCULAR | Status: AC
Start: 2020-05-25 — End: 2020-05-25
  Administered 2020-05-25: 50 ug via INTRAVENOUS
  Filled 2020-05-25: qty 2

## 2020-05-25 MED ORDER — SENNA 8.6 MG PO TABS
1.0000 | ORAL_TABLET | Freq: Two times a day (BID) | ORAL | Status: DC
  Administered 2020-05-26 – 2020-06-01 (×10): 8.6 mg via ORAL
  Filled 2020-05-25 (×12): qty 1

## 2020-05-25 MED ORDER — SODIUM CHLORIDE 0.9 % IV BOLUS
500.0000 mL | Freq: Once | INTRAVENOUS | Status: AC
  Administered 2020-05-25: 500 mL via INTRAVENOUS

## 2020-05-25 MED ORDER — LORAZEPAM 2 MG/ML IJ SOLN: 0.0000 mg | Freq: Two times a day (BID) | INTRAMUSCULAR | Status: AC

## 2020-05-25 MED ORDER — METOCLOPRAMIDE HCL 5 MG/ML IJ SOLN: 10.0000 mg | Freq: Once | INTRAMUSCULAR | Status: DC

## 2020-05-25 MED ORDER — SODIUM CHLORIDE 0.9 % IV SOLN
8.0000 mg/h | INTRAVENOUS | Status: DC
  Administered 2020-05-25 – 2020-05-26 (×2): 8 mg/h via INTRAVENOUS
  Filled 2020-05-25 (×2): qty 80

## 2020-05-25 MED ORDER — LORAZEPAM 1 MG PO TABS
0.0000 mg | ORAL_TABLET | Freq: Four times a day (QID) | ORAL | Status: AC
  Administered 2020-05-26: 1 mg via ORAL
  Filled 2020-05-25 (×2): qty 1

## 2020-05-25 MED ORDER — THIAMINE HCL 100 MG/ML IJ SOLN
100.0000 mg | Freq: Every day | INTRAMUSCULAR | Status: DC
  Administered 2020-06-02 – 2020-06-08 (×6): 100 mg via INTRAVENOUS
  Filled 2020-05-25 (×7): qty 2

## 2020-05-25 MED ORDER — SODIUM CHLORIDE 0.9 % IV BOLUS
1000.0000 mL | Freq: Once | INTRAVENOUS | Status: AC
  Administered 2020-05-25: 1000 mL via INTRAVENOUS

## 2020-05-25 MED ORDER — HEPARIN SODIUM (PORCINE) 5000 UNIT/ML IJ SOLN: 5000.0000 [IU] | Freq: Three times a day (TID) | INTRAMUSCULAR | Status: DC

## 2020-05-25 MED ORDER — PANTOPRAZOLE SODIUM 40 MG IV SOLR: 40.0000 mg | Freq: Two times a day (BID) | INTRAVENOUS | Status: DC

## 2020-05-25 NOTE — Assessment & Plan Note (Signed)
Likely related to patient's overall decompensation and current hypotension; suspect this is due to general hypovolemia As above, recommend evaluation in emergency department

## 2020-05-25 NOTE — ED Notes (Signed)
Provider at bedside

## 2020-05-25 NOTE — Assessment & Plan Note (Signed)
PHQ-9 with a score of 13 out of 27.  Most symptomatic questions were related to sleep, energy, poor appetite.  Current symptom of exertional dyspnea likely playing a significant role with this Will reassess at future visits as we are hopefully able to improve on his dyspnea with further evaluation and appropriate treatment

## 2020-05-25 NOTE — Patient Instructions (Signed)
  Medication Instructions:  Your physician recommends that you continue on your current medications as directed. Please refer to the Current Medication list given to you today. --If you need a refill on any your medications before your next appointment, please call your pharmacy first. If no refills are authorized on file call the office.--  Referrals/Procedures/Imaging: Your physician recommends that you go to the Emergency Department At [facility] or [facility] for evaluation and treatment  Follow-Up: Your next appointment:   Your physician recommends that you schedule a follow-up appointment in: 1 WEEK with Dr. [place]  Thanks for letting us be apart of your health journey!!  Primary Care and Sports Medicine   Dr. [place] and Worthy Keeler, DNP, AGNP  We recommend signing up for the patient portal called "MyChart".  Sign up information is provided on this After Visit Summary.  MyChart is used to connect with patients for Virtual Visits (Telemedicine).  Patients are able to view lab/test results, encounter notes, upcoming appointments, etc.  Non-urgent messages can be sent to your provider as well.   To learn more about what you can do with MyChart, go to [email or web address].

## 2020-05-25 NOTE — ED Notes (Signed)
Consent for blood infusion obtained

## 2020-05-25 NOTE — ED Notes (Signed)
Attempted to give report x2 

## 2020-05-25 NOTE — H&P (Incomplete)
     Date: 05/25/2020               Patient Name:  Marc Mcdonald MRN: [id number]  DOB: 01-23-1956 Age / Sex: 65 y.o., male   PCP: [place], Blondell Reveal, MD         Medical Service: Internal Medicine Teaching Service         Attending Physician: Dr. Elnora Morrison, MD    First Contact: Dr. Marland Kitchen Pager: [phone]  Second Contact: Dr. Marland Kitchen Pager: [phone]       After Hours (After 5p/  First Contact Pager: [phone]  weekends / holidays): Second Contact Pager: [phone]   Chief Complaint: ***  History of Present Illness: ***  Meds: *** Current Meds  Medication Sig  . ibuprofen (ADVIL) 200 MG tablet Take 400 mg by mouth every 4 (four) hours as needed for moderate pain.     Allergies: Allergies as of 05/25/2020  . (No Known Allergies)   Past Medical History:  Diagnosis Date  . COPD (chronic obstructive pulmonary disease) ([place])     Family History: ***  Social History: ***  Review of Systems: A complete ROS was negative except as per HPI. ***  Physical Exam: Blood pressure 112/73, pulse 99, temperature 97.8 F (36.6 C), temperature source Oral, resp. rate 20, height 5\' 11"  (1.803 m), weight 57.2 kg, SpO2 100 %. ***  EKG: personally reviewed my interpretation is***  CXR: personally reviewed my interpretation is***  Assessment & Plan by Problem: Active Problems:   * No active hospital problems. *  Hemorrhagic Shock secondary to GI bleed. Hypotensive, tachycardic, hgb 5.5. Stool occult +.  Suspect this to be in the setting of heavy alcohol use. No evidence for coagulopathy or cirrhosis. Chest/abdomen CT unremarkable for source. Suspect indirect hyperbilirubinemia 2/2 to bleed Plan -Appreciate evaluation by GI.  -transfuse 1U PRBC with 2h post transfusion h/h -BID protonix -q6h CBC until hgb stabilizes -monitor bili  Moderate hyponatremia--sodium 125 on admission. I initially considered cirrhosis as a cause however abdominal CT from 2021 suggests this to be more so hepatic  steatosis. Platelets are normal at baseline and AST/ALT are normal. His bili was normal back in 2021 as well.    Dispo: Admit patient to {STATUS:3044014::"Observation with expected length of stay less than 2 midnights.","Inpatient with expected length of stay greater than 2 midnights."}  Signed: Gaylan Gerold, DO 05/25/2020, 4:23 PM  Pager: [phone] After 5pm on weekdays and 1pm on weekends: On Call pager: [phone]

## 2020-05-25 NOTE — ED Triage Notes (Addendum)
Pt from home BIB daughter in law. Pt complains of dizziness and lightheadedness for one week. Brought in today because "my family was just tired of hearing me complain about it" Pt states " I can barely walk 5 feet without feeling dizzy and like I'm gonna pass out".  Pt reports chest pain on the L side near his ribs. Pt does report a punctured lung from a few months ago.Pt also complains of "spine pain" starting at mid back radiating to neck that's been going on for a week. Pt states pain is so strong it wakes me out of my sleep.  Pt reports SOB that has been worsening over the past week especially with exertion. Pt reports just feeling really weak. Denies N/V/D. Warm, pink and dry, AOx4.

## 2020-05-25 NOTE — Hospital Course (Addendum)
Admitted 05/25/2020  Allergies: Patient has no known allergies. Pertinent Hx: undiagnosed COPD, past left rib 5,6,8 fracture  65 y.o. male p/w worsening dyspnea on exertion 2/2 symptomatic anemia  *Symptomatic anemia: FOBT+, Hgb 5.5, received 2u pRBC. GI consulted, [place]/EGD tmr. On IV protonix  *Alcohol abuse: CIWA w Ativan  Consults: GI Meds: IV protonix, thiamin VTE ppx: heparin IVF: na Diet: NPO after midnight

## 2020-05-25 NOTE — Assessment & Plan Note (Signed)
Chart indicates diagnosis of COPD, however not able to find evidence of prior PFTs completed, thus uncertain of true status of COPD or degree of severity Clinically, I do believe the patient has chronic lung disease and likely COPD due to his history of smoking and current symptoms Given current presentation of increased dyspnea, cough, hypotension, tachycardia, feel that patient should present to emergency room for further evaluation and management Further assessment as an outpatient likely to include PFTs, possible pulmonology referral due to suspected advanced age of lung disease, may require CT scan due to patient's increased risk of lung cancer

## 2020-05-25 NOTE — Assessment & Plan Note (Signed)
No obvious rash observed on the skin, patient with excoriations and some scabs from chronic scratching Symptoms of pruritus possibly related to underlying liver disease, given that patient will be presenting to ED, likely will be evaluated on labs there, if liver enzymes are not checked, will check as an outpatient

## 2020-05-25 NOTE — ED Notes (Signed)
Blood Ready per lab

## 2020-05-25 NOTE — H&P (View-Only) (Signed)
[facility] Consult: 4:03 PM 05/25/2020  LOS: 0 days    Referring Provider: Dr Reather Converse Primary Care Physician:  [place], Blondell Reveal, MD.  Had not seen a doctor for 20 years before today Primary Gastroenterologist:  Unassigned.       Reason for Consultation: Anemia.  FOBT positive stool.   HPI: Marc Mcdonald is a 65 y.o. male.   No regular medical care.  Fall sustaining a left rib fracture and small pneumothorax in July 2021.  Patient's had shortness of breath/dyspnea on exertion for several months even dating back to his pneumothorax last July.  Over the last 10 to 14 days he has had progressive weakness, dizziness.  He is so weak that he cannot get up to get to the refrigerator to the get food or to get beer.  He has had thoracic spine pain.  A couple of weeks ago he went through 200 ibuprofen in 7 to 10 days so his family took it away from him and has been rationing it.  In the last week he is taken a couple of ibuprofen every few days.  Also having epigastric and left upper quadrant pain for a couple of weeks.  No nausea or vomiting, generally poor appetite.  Stools are the color of chocolate milk and can be solid but more recently loose.  Moves his bowels every few days, sometimes has to use over-the-counter laxatives.  This has been the case for a few years.  Patient drinks up to 18 beers a day and at least 12 beers daily, 12 ounce Budweiser's.  Has trouble swallowing large pills which she has to break in half and large pieces of meat.  He estimates his weight loss of 10 pounds since 08/2019.  At presentation BP 85/61 with pulse 114.  Blood pressures have since moved into the 120s/60s-80s.  Heart rate still accelerated with rate in the low 100s but now 90s. On DRE he has maroon stool, FOBT positive. Hgb 5.5, on  recheck 6.5.  MCV 77. Last July his Hb was 12.6 and MCV was 94.  Normal platelets.  BUN and creatinine are normal. T bili 2.1.  Alkaline phosphatase 98, AST/ALT 17/13.  Troponins not elevated.  Lactic acid elevated at 5.3. CTAP with chest, angio: No acute abnormalities.  Vasculature unremarkable.  New, small densities in the right upper lobe, suspect postinflammatory or ATX.  Thus far he has received fentanyl, oral thiamine.  Protonix drip ordered, not yet initiated.  Estranged from his family so he is not aware of any family history other than that his sister is "crazy" Quit smoking a little over a year ago.  Lives with his son.    Past Medical History:  Diagnosis Date  . COPD (chronic obstructive pulmonary disease) ([facility])     No past surgical history on file.  Prior to Admission medications   Medication Sig Start Date End Date Taking? Authorizing Provider  ibuprofen (ADVIL) 200 MG tablet Take 400 mg by mouth every 4 (four) hours as needed for moderate pain.   Yes  [provider]    Scheduled Meds: . LORazepam  0-4 mg Intravenous Q6H   Or  . LORazepam  0-4 mg Oral Q6H  . [START ON 05/27/2020] LORazepam  0-4 mg Intravenous Q12H   Or  . [START ON 05/27/2020] LORazepam  0-4 mg Oral Q12H  . [START ON 05/29/2020] pantoprazole  40 mg Intravenous Q12H  . thiamine  100 mg Oral Daily   Or  . thiamine  100 mg Intravenous Daily   Infusions: . sodium chloride Stopped (05/25/20 1308)  . pantoprozole (PROTONIX) infusion    . pantoprazole (PROTONIX) 80 mg IVPB     PRN Meds:    Allergies as of 05/25/2020  . (No Known Allergies)    Family History  Family history unknown: Yes    Social History   Socioeconomic History  . Marital status: Divorced    Spouse name: Not on file  . Number of children: Not on file  . Years of education: Not on file  . Highest education level: Not on file  Occupational History  . Not on file  Tobacco Use  . Smoking status: Former Smoker     Types: Cigarettes    Quit date: 08/25/2019    Years since quitting: 0.7  . Smokeless tobacco: Never Used  Vaping Use  . Vaping Use: Never used  Substance and Sexual Activity  . Alcohol use: Yes    Comment: daily  . Drug use: Never  . Sexual activity: Not Currently  Other Topics Concern  . Not on file  Social History Narrative  . Not on file   Social Determinants of Health   Financial Resource Strain: Not on file  Food Insecurity: Not on file  Transportation Needs: Not on file  Physical Activity: Not on file  Stress: Not on file  Social Connections: Not on file  Intimate Partner Violence: Not on file    REVIEW OF SYSTEMS: Constitutional: See HPI. ENT:  No nose bleeds Pulm: Occasionally productive cough of clear sputum, no hemoptysis. CV:  No palpitations, no LE edema.  GU:  No hematuria, no frequency GI: See HPI Heme: Denies unusual or excessive bleeding or bruising. Transfusions: None until today. Neuro:  No headaches, no peripheral tingling or numbness Derm: Has had eruptions of small pruritic lesions on his posterior and anterior trunk. Endocrine:  No sweats or chills.  No polyuria or dysuria Immunization: No recent vaccines. Travel:  None beyond local counties in last few months.    PHYSICAL EXAM: Vital signs in last 24 hours: Vitals:   05/25/20 1345 05/25/20 1523  BP: 123/71 122/63  Pulse: (!) 101 (!) 102  Resp: (!) 22 18  Temp:  98.2 F (36.8 C)  SpO2: 100% 100%   Wt Readings from Last 3 Encounters:  05/25/20 57.2 kg  05/25/20 57.2 kg  10/12/19 60.7 kg    General: Patient looks unwell but not acutely ill.  Pale, thin just generally sickly. Head: No facial asymmetry or swelling.  No signs of head trauma. Eyes: Conjunctiva pale.  No scleral icterus.  EOMI Ears: Hard of hearing. Nose: No discharge or congestion. Mouth: Tongue midline.  Mucosa is pink, moist, clear.  Dental disease. Neck: No JVD, masses, thyromegaly. Lungs: Diminished breath sounds  but clear. Heart: RRR.  No MRG.  S1, S2 present Abdomen: Soft, not tender.  No HSM, masses, bruits, hernias.   Rectal: Deferred.  Per the ED physician assistant there was maroon stool in the vault which tested FOBT positive Musc/Skeltl: No  joint redness, swelling or gross deformity. Extremities: No CCE. Neurologic: Alert.  Hard of hearing.  Oriented x3.  Moves all 4 limbs without tremors, strength not tested. Skin: A few punctate skin scabs on the abdomen which patient scratches at periodically. Tattoos: On the trunk.  Colorful, professional quality. Nodes: No cervical adenopathy Psych: Animated, cooperative, fluid speech.  Intake/Output from previous day: No intake/output data recorded. Intake/Output this shift: Total I/O In: 500 [IV Piggyback:500] Out: -   LAB RESULTS: Recent Labs    05/25/20 1104 05/25/20 1305  WBC 11.7*  --   HGB 5.5* 6.5*  HCT 21.3* 19.0*  PLT 424*  --    BMET Lab Results  Component Value Date   NA 127 (L) 05/25/2020   NA 125 (L) 05/25/2020   NA 134 (L) 09/09/2019   K 3.9 05/25/2020   K 3.8 05/25/2020   K 4.1 09/09/2019   CL 95 (L) 05/25/2020   CL 100 09/09/2019   CL 101 09/08/2019   CO2 16 (L) 05/25/2020   CO2 18 (L) 09/09/2019   CO2 22 09/08/2019   GLUCOSE 135 (H) 05/25/2020   GLUCOSE 117 (H) 09/09/2019   GLUCOSE 108 (H) 09/08/2019   BUN 5 (L) 05/25/2020   BUN <5 (L) 09/09/2019   BUN <5 (L) 09/08/2019   CREATININE 0.70 05/25/2020   CREATININE 0.63 09/09/2019   CREATININE 0.70 09/08/2019   CALCIUM 8.1 (L) 05/25/2020   CALCIUM 8.3 (L) 09/09/2019   CALCIUM 8.4 (L) 09/08/2019   LFT Recent Labs    05/25/20 1104  PROT 6.7  ALBUMIN 2.4*  AST 17  ALT 13  ALKPHOS 98  BILITOT 2.1*  BILIDIR 0.2  IBILI 1.9*   PT/INR No results found for: INR, PROTIME Hepatitis Panel No results for input(s): HEPBSAG, HCVAB, HEPAIGM, HEPBIGM in the last 72 hours. C-Diff No components found for: CDIFF Lipase  No results found for: LIPASE  Drugs  of Abuse  No results found for: LABOPIA, COCAINSCRNUR, LABBENZ, AMPHETMU, THCU, LABBARB   RADIOLOGY STUDIES: DG Chest Port 1 View  Result Date: 05/25/2020 CLINICAL DATA:  Chest pain EXAM: PORTABLE CHEST 1 VIEW COMPARISON:  10/12/2019 FINDINGS: The heart size and mediastinal contours are within normal limits. Emphysema. Both lungs are clear. No pleural effusion or pneumothorax. Chronic bilateral rib fractures. IMPRESSION: No acute process in the chest. Electronically Signed   By: Macy Mis M.D.   On: 05/25/2020 11:49   CT Angio Chest/Abd/Pel for Dissection W and/or Wo Contrast  Result Date: 05/25/2020 CLINICAL DATA:  65 year old with chest pain. Evaluate for an aortic dissection. EXAM: CT ANGIOGRAPHY CHEST, ABDOMEN AND PELVIS TECHNIQUE: Non-contrast CT of the chest was initially obtained. Multidetector CT imaging through the chest, abdomen and pelvis was performed using the standard protocol during bolus administration of intravenous contrast. Multiplanar reconstructed images and MIPs were obtained and reviewed to evaluate the vascular anatomy. CONTRAST:  148mL OMNIPAQUE IOHEXOL 350 MG/ML SOLN COMPARISON:  CT 09/09/2019 FINDINGS: CTA CHEST FINDINGS Cardiovascular: Aortic root at the sinuses of Valsalva measures 3.7 cm. Ascending thoracic aorta measures 3.2 cm without dissection. Typical three-vessel arch anatomy. Great vessels are patent. Proximal descending thoracic aorta measures 2.8 cm. Distal descending thoracic aorta measures 2.5 cm.Main pulmonary arteries are patent without large filling defects. Heart size is normal. No significant pericardial effusion. Mediastinum/Nodes: Small hiatal hernia. No significant chest lymphadenopathy. Lungs/Pleura: Scarring at the lung apices. Small amount of debris or mucus in the trachea. No large pleural effusions. Stable punctate nodule in the superior segment  of the left lower lobe on sequence 8 image 47. No significant airspace disease or consolidation in the  lungs. Negative for pneumothorax. Few patchy densities in the right upper lobe near the right minor fissure are suggestive for atelectasis or post inflammatory changes based on the configuration, sequence 6, image 37. Musculoskeletal: Old bilateral rib fractures. No acute bone abnormality. Review of the MIP images confirms the above findings. CTA ABDOMEN AND PELVIS FINDINGS VASCULAR Aorta: Atherosclerotic disease in the abdominal aorta without aneurysm, dissection or significant stenosis. Celiac: Celiac trunk is widely patent. Splenic artery and left gastric artery are patent. Gastroduodenal artery originates directly from the celiac trunk. Proper hepatic artery comes off the SMA. SMA: SMA is widely patent. Renals: Both renal arteries are patent without evidence of aneurysm, dissection, vasculitis, fibromuscular dysplasia or significant stenosis. IMA: Patent Inflow: Atherosclerotic plaque in the iliac arteries without significant stenosis. Common, internal and external iliac arteries are patent bilaterally. Close to 50% narrowing in the proximal left common femoral artery. Proximal femoral arteries are patent bilaterally. Veins: No obvious venous abnormality within the limitations of this arterial phase study. Review of the MIP images confirms the above findings. NON-VASCULAR Hepatobiliary: No appearance of the gallbladder. No acute abnormality to the liver. No significant biliary dilatation. Pancreas: Unremarkable. No pancreatic ductal dilatation or surrounding inflammatory changes. Spleen: Normal in size without focal abnormality. Adrenals/Urinary Tract: Normal appearance of the adrenal glands. Normal appearance of both kidneys without hydronephrosis. Normal appearance of the urinary bladder. Stomach/Bowel: Stomach is within normal limits. Appendix appears normal. No evidence of bowel wall thickening, distention, or inflammatory changes. Small hiatal hernia. Lymphatic: No lymph node enlargement in the abdomen or  pelvis. Reproductive: Stable appearance of the prostate and seminal vesicles. Other: Negative for ascites.  Negative for free air. Musculoskeletal: No acute bone abnormality. Review of the MIP images confirms the above findings. IMPRESSION: 1. No acute abnormality in the chest, abdomen or pelvis. Specifically, negative for an acute aortic injury or dissection. 2. Main visceral arteries are patent. No evidence for mesenteric ischemia. No acute bowel abnormality. 3. Old bilateral rib fractures. 4. New small densities in the anterior right upper lobe near the right minor fissure. Suspect these are postinflammatory or atelectasis. 5.  Aortic Atherosclerosis (ICD10-I70.0). Electronically Signed   By: Markus Daft M.D.   On: 05/25/2020 15:29      IMPRESSION:   *   Microcytic anemia.  FOBT positive maroon stool in patient who drinks excessively. 2 units PRBCs ordered.  Protonix drip initiated.  *   Alcohol abuse.  No evidence for liver disease on today's CT imaging. Has not had a drink for a few days.  CIWA protocol in place.  *   Dysphagia.  *    epigastric/left upper quadrant pain.  No significant pathology of abdomen or pelvis on CT.    PLAN:     *   EGD/colonoscopy tomorrow, patient agreeable to proceed.  *    Clear liquids.  See orders related to bowel prep.   Azucena Freed  05/25/2020, 4:03 PM Phone [phone]   Attending physician's note   I have reviewed the chart and didn't examined the patient as pt was fast asleep. I agree with the Advanced Practitioner's note, impression and recommendations.   GI Bleed- upper vs lower. H/O NSAIDs and ETOH. Hb 5.5 on Adm, s/p 1U to 6.5. Neg CTA for bleeding or liver abn.  IDA ?  Intermittent dysphagia/Epi pain/LUQ pain Wt loss  Protein calorie malnutrition  Plan: -Transfuse to Hb>7 -IV Protonix -EGD/colon in AM. -Watch for alcohol withdrawal. -CTA reviewed. -Stop all nonsteroidals and alcohol.   Carmell Austria, MD [facility]  [facility] [phone]

## 2020-05-25 NOTE — H&P (Signed)
Date: 05/25/2020               Patient Name:  Marc Mcdonald MRN: [id number]  DOB: May 08, 1955 Age / Sex: 65 y.o., male   PCP: [place], Blondell Reveal, MD         Medical Service: Internal Medicine Teaching Service         Attending Physician: Dr. Lucious Groves, DO    First Contact: Dr. Alfonse Spruce Pager: [phone]  Second Contact: Dr. Darrick Meigs  Pager: [phone]       After Hours (After 5p/  First Contact Pager: [phone]  weekends / holidays): Second Contact Pager: [phone]   Chief Complaint: Symptomatic anemia   History of Present Illness:   Patient is 65 yo gentleman with past medical history of questionable COPD, past left 5,6,8 ribs fracture who presented to the ED for dizziness, dyspnea with exertion and found to have hemoglobin of 5.5.  Patient has not seen a physician for many years and is not taking the medication but ibuprofen.  In the last 5 weeks, he reports feeling dizzy with any exertion.  States that he can feel dizzy with just rolling to one side on the bed.  Also reports worsening shortness of breath.  States that he feels short of breath with walking very short distance that prevented him from performing normal activities such as  buying groceries.  He denies chest pain.  Endorses wheezing without any cough, and intermittent ankle swelling.  Patient has not had a formal EFT done.  He is smoking 1 pack every other day.  Patient denies melena or blood in his stool or nausea/vomiting.  Reports abdominal pain with poor appetite.  He states that he does not want to eat after drinking beer.  It only drinks 12-15 beers a day.  However he only drinks 1-2 beers because he could not get to his fridge because of shortness of breath.  His daughter-in-law states that he has lost 10 pounds since July.  She also states that he will be shaky in the morning if he does not drink alcohol.  He also reports pain of his left ribs that radiates to the right side.  In the ED, FOBT positive. Hemoglobin  improved 6.5 after 1 unit, currently receiving the second unit.  BMP remarkable for sodium 125 with normal creatinine.  LFT showed elevated total bili at 2.1 with indirect of 1.9.  Albumin 2.4.  His troponin has been flat.  Initial lactic acid of 5.3 and trend down to 1.9 after 1.5 L of normal saline.   Meds:  Current Meds  Medication Sig  . ibuprofen (ADVIL) 200 MG tablet Take 400 mg by mouth every 4 (four) hours as needed for moderate pain.     Allergies: Allergies as of 05/25/2020  . (No Known Allergies)   Past Medical History:  Diagnosis Date  . COPD (chronic obstructive pulmonary disease) ([facility])     Family History:  No clear family past medical history. States that his family was estranged  Social History:  Lives at home with son Drinks 12-15 beers a day. Drink 2-3 beers in the last few days Smoke 1 pack every day Denies drugs use  Review of Systems: A complete ROS was negative except as per HPI.   Physical Exam: Blood pressure 121/80, pulse 98, temperature 97.8 F (36.6 C), temperature source Oral, resp. rate 16, height 5\' 11"  (1.803 m), weight 57.2 kg, SpO2 93 %.  Physical Exam Constitutional:  General: He is not in acute distress. HENT:     Head: Normocephalic.  Eyes:     General:        Right eye: No discharge.        Left eye: No discharge.  Cardiovascular:     Rate and Rhythm: Regular rhythm. Tachycardia present.  Pulmonary:     Effort: Pulmonary effort is normal. No respiratory distress.     Breath sounds: Wheezing (Very mild wheezing noted in the left upper lung) present.  Abdominal:     General: Bowel sounds are normal. There is no distension.     Tenderness: There is abdominal tenderness (mild tenderness to palpation of RUQ). There is no right CVA tenderness, left CVA tenderness or guarding.  Musculoskeletal:     Cervical back: Normal range of motion.     Right lower leg: No edema.     Left lower leg: No edema.  Skin:    General: Skin is warm.   Neurological:     Mental Status: He is alert.  Psychiatric:        Mood and Affect: Mood normal.    EKG: personally reviewed my interpretation is sinus tachycardia  CXR: personally reviewed my interpretation is no acute process, lung hyperventilation  Assessment & Plan by Problem: Active Problems:   Symptomatic anemia  Patient is 65 yo gentleman with past medical history of questionable COPD, past left 5,6,8 ribs fracture who presented to the ED for dizziness, dyspnea with exertion, likely due to symptomatic anemia.  Symptomatic anemia Patient has had worsening shortness of breath with exertion.  FOBT was positive with hemoglobin 5.5. Received 2u pRBC.  His risk of GI bleeding include alcohol abuse with NSAIDs use.  CTA did not reveal any liver disease or any significant normality in the chest/abdomen/pelvis.  No signs of obvious bleedings elsewhere. GI was consulted and planned colonoscopy/EGD tomorrow.  Patient is on IV Protonix. -Appreciate GI recommendation -Check H&H posttransfusion. Transfuse if Hgb < 7 -Continue IV Protonix  Dyspnea with exertion This is a chronic problem for patient, however worsening recently.  This is most likely due to symptomatic anemia.  However I am concerned given his intermittent lower extremity edema with this dyspnea on exertion.  Will check echocardiogram to rule out heart failure.  No evidence of COPD exacerbation with lack of obvious wheezing, cough or sputum production. -Pending echocardiogram -Albuterol as needed  Moderate Hyponatremia Sodium on admission was 125.  Likely due to his poor p.o. intake.  Will check serum osmolarity, urine sodium, urine osmolarity to evaluate the cause of his hyponatremia.  Patient has received 1.5 L of normal saline.  -BMP in a.m. -Pending serum osmolarity, urine sodium, urine osmolality  Elevated bilirubin Total bil 2.1 and indirect 1.9. No sign of biliary dilation on CT, gallbladder not visualized. No acute  abnormality of liver. Patient is not juandice on exam. Unsure if this is hepatic related vs hemolysis. -LFT in am  Alcohol abuse -CIWA w Ativan  New lung density Suspect postinflammatory or atelectasis. Will monitor   Dispo: Admit patient to Inpatient with expected length of stay greater than 2 midnights.  Signed: Gaylan Gerold, DO 05/25/2020, 6:38 PM  Pager: [phone] After 5pm on weekdays and 1pm on weekends: On Call pager: [phone]

## 2020-05-25 NOTE — Assessment & Plan Note (Signed)
Low blood pressure here in the office today, suspect this is due to hypovolemia and contributing to ongoing symptoms of lightheaded and dizziness, particularly with standing and ambulation Given associated hypotension, dyspnea, tachycardia, recommend the patient presents to the emergency department for further evaluation, likely volume resuscitation

## 2020-05-25 NOTE — Consult Note (Addendum)
[facility] Consult: 4:03 PM 05/25/2020  LOS: 0 days    Referring Provider: Dr Reather Converse Primary Care Physician:  [place], Blondell Reveal, MD.  Had not seen a doctor for 20 years before today Primary Gastroenterologist:  Unassigned.       Reason for Consultation: Anemia.  FOBT positive stool.   HPI: Marc Mcdonald is a 65 y.o. male.   No regular medical care.  Fall sustaining a left rib fracture and small pneumothorax in July 2021.  Patient's had shortness of breath/dyspnea on exertion for several months even dating back to his pneumothorax last July.  Over the last 10 to 14 days he has had progressive weakness, dizziness.  He is so weak that he cannot get up to get to the refrigerator to the get food or to get beer.  He has had thoracic spine pain.  A couple of weeks ago he went through 200 ibuprofen in 7 to 10 days so his family took it away from him and has been rationing it.  In the last week he is taken a couple of ibuprofen every few days.  Also having epigastric and left upper quadrant pain for a couple of weeks.  No nausea or vomiting, generally poor appetite.  Stools are the color of chocolate milk and can be solid but more recently loose.  Moves his bowels every few days, sometimes has to use over-the-counter laxatives.  This has been the case for a few years.  Patient drinks up to 18 beers a day and at least 12 beers daily, 12 ounce Budweiser's.  Has trouble swallowing large pills which she has to break in half and large pieces of meat.  He estimates his weight loss of 10 pounds since 08/2019.  At presentation BP 85/61 with pulse 114.  Blood pressures have since moved into the 120s/60s-80s.  Heart rate still accelerated with rate in the low 100s but now 90s. On DRE he has maroon stool, FOBT positive. Hgb 5.5, on  recheck 6.5.  MCV 77. Last July his Hb was 12.6 and MCV was 94.  Normal platelets.  BUN and creatinine are normal. T bili 2.1.  Alkaline phosphatase 98, AST/ALT 17/13.  Troponins not elevated.  Lactic acid elevated at 5.3. CTAP with chest, angio: No acute abnormalities.  Vasculature unremarkable.  New, small densities in the right upper lobe, suspect postinflammatory or ATX.  Thus far he has received fentanyl, oral thiamine.  Protonix drip ordered, not yet initiated.  Estranged from his family so he is not aware of any family history other than that his sister is "crazy" Quit smoking a little over a year ago.  Lives with his son.    Past Medical History:  Diagnosis Date  . COPD (chronic obstructive pulmonary disease) (HCC)     No past surgical history on file.  Prior to Admission medications   Medication Sig Start Date End Date Taking? Authorizing Provider  ibuprofen (ADVIL) 200 MG tablet Take 400 mg by mouth every 4 (four) hours as needed for moderate pain.   Yes  [provider]    Scheduled Meds: . LORazepam  0-4 mg Intravenous Q6H   Or  . LORazepam  0-4 mg Oral Q6H  . [START ON 05/27/2020] LORazepam  0-4 mg Intravenous Q12H   Or  . [START ON 05/27/2020] LORazepam  0-4 mg Oral Q12H  . [START ON 05/29/2020] pantoprazole  40 mg Intravenous Q12H  . thiamine  100 mg Oral Daily   Or  . thiamine  100 mg Intravenous Daily   Infusions: . sodium chloride Stopped (05/25/20 1308)  . pantoprozole (PROTONIX) infusion    . pantoprazole (PROTONIX) 80 mg IVPB     PRN Meds:    Allergies as of 05/25/2020  . (No Known Allergies)    Family History  Family history unknown: Yes    Social History   Socioeconomic History  . Marital status: Divorced    Spouse name: Not on file  . Number of children: Not on file  . Years of education: Not on file  . Highest education level: Not on file  Occupational History  . Not on file  Tobacco Use  . Smoking status: Former Smoker     Types: Cigarettes    Quit date: 08/25/2019    Years since quitting: 0.7  . Smokeless tobacco: Never Used  Vaping Use  . Vaping Use: Never used  Substance and Sexual Activity  . Alcohol use: Yes    Comment: daily  . Drug use: Never  . Sexual activity: Not Currently  Other Topics Concern  . Not on file  Social History Narrative  . Not on file   Social Determinants of Health   Financial Resource Strain: Not on file  Food Insecurity: Not on file  Transportation Needs: Not on file  Physical Activity: Not on file  Stress: Not on file  Social Connections: Not on file  Intimate Partner Violence: Not on file    REVIEW OF SYSTEMS: Constitutional: See HPI. ENT:  No nose bleeds Pulm: Occasionally productive cough of clear sputum, no hemoptysis. CV:  No palpitations, no LE edema.  GU:  No hematuria, no frequency GI: See HPI Heme: Denies unusual or excessive bleeding or bruising. Transfusions: None until today. Neuro:  No headaches, no peripheral tingling or numbness Derm: Has had eruptions of small pruritic lesions on his posterior and anterior trunk. Endocrine:  No sweats or chills.  No polyuria or dysuria Immunization: No recent vaccines. Travel:  None beyond local counties in last few months.    PHYSICAL EXAM: Vital signs in last 24 hours: Vitals:   05/25/20 1345 05/25/20 1523  BP: 123/71 122/63  Pulse: (!) 101 (!) 102  Resp: (!) 22 18  Temp:  98.2 F (36.8 C)  SpO2: 100% 100%   Wt Readings from Last 3 Encounters:  05/25/20 57.2 kg  05/25/20 57.2 kg  10/12/19 60.7 kg    General: Patient looks unwell but not acutely ill.  Pale, thin just generally sickly. Head: No facial asymmetry or swelling.  No signs of head trauma. Eyes: Conjunctiva pale.  No scleral icterus.  EOMI Ears: Hard of hearing. Nose: No discharge or congestion. Mouth: Tongue midline.  Mucosa is pink, moist, clear.  Dental disease. Neck: No JVD, masses, thyromegaly. Lungs: Diminished breath sounds  but clear. Heart: RRR.  No MRG.  S1, S2 present Abdomen: Soft, not tender.  No HSM, masses, bruits, hernias.   Rectal: Deferred.  Per the ED physician assistant there was maroon stool in the vault which tested FOBT positive Musc/Skeltl: No  joint redness, swelling or gross deformity. Extremities: No CCE. Neurologic: Alert.  Hard of hearing.  Oriented x3.  Moves all 4 limbs without tremors, strength not tested. Skin: A few punctate skin scabs on the abdomen which patient scratches at periodically. Tattoos: On the trunk.  Colorful, professional quality. Nodes: No cervical adenopathy Psych: Animated, cooperative, fluid speech.  Intake/Output from previous day: No intake/output data recorded. Intake/Output this shift: Total I/O In: 500 [IV Piggyback:500] Out: -   LAB RESULTS: Recent Labs    05/25/20 1104 05/25/20 1305  WBC 11.7*  --   HGB 5.5* 6.5*  HCT 21.3* 19.0*  PLT 424*  --    BMET Lab Results  Component Value Date   NA 127 (L) 05/25/2020   NA 125 (L) 05/25/2020   NA 134 (L) 09/09/2019   K 3.9 05/25/2020   K 3.8 05/25/2020   K 4.1 09/09/2019   CL 95 (L) 05/25/2020   CL 100 09/09/2019   CL 101 09/08/2019   CO2 16 (L) 05/25/2020   CO2 18 (L) 09/09/2019   CO2 22 09/08/2019   GLUCOSE 135 (H) 05/25/2020   GLUCOSE 117 (H) 09/09/2019   GLUCOSE 108 (H) 09/08/2019   BUN 5 (L) 05/25/2020   BUN <5 (L) 09/09/2019   BUN <5 (L) 09/08/2019   CREATININE 0.70 05/25/2020   CREATININE 0.63 09/09/2019   CREATININE 0.70 09/08/2019   CALCIUM 8.1 (L) 05/25/2020   CALCIUM 8.3 (L) 09/09/2019   CALCIUM 8.4 (L) 09/08/2019   LFT Recent Labs    05/25/20 1104  PROT 6.7  ALBUMIN 2.4*  AST 17  ALT 13  ALKPHOS 98  BILITOT 2.1*  BILIDIR 0.2  IBILI 1.9*   PT/INR No results found for: INR, PROTIME Hepatitis Panel No results for input(s): HEPBSAG, HCVAB, HEPAIGM, HEPBIGM in the last 72 hours. C-Diff No components found for: CDIFF Lipase  No results found for: LIPASE  Drugs  of Abuse  No results found for: LABOPIA, COCAINSCRNUR, LABBENZ, AMPHETMU, THCU, LABBARB   RADIOLOGY STUDIES: DG Chest Port 1 View  Result Date: 05/25/2020 CLINICAL DATA:  Chest pain EXAM: PORTABLE CHEST 1 VIEW COMPARISON:  10/12/2019 FINDINGS: The heart size and mediastinal contours are within normal limits. Emphysema. Both lungs are clear. No pleural effusion or pneumothorax. Chronic bilateral rib fractures. IMPRESSION: No acute process in the chest. Electronically Signed   By: Macy Mis M.D.   On: 05/25/2020 11:49   CT Angio Chest/Abd/Pel for Dissection W and/or Wo Contrast  Result Date: 05/25/2020 CLINICAL DATA:  65 year old with chest pain. Evaluate for an aortic dissection. EXAM: CT ANGIOGRAPHY CHEST, ABDOMEN AND PELVIS TECHNIQUE: Non-contrast CT of the chest was initially obtained. Multidetector CT imaging through the chest, abdomen and pelvis was performed using the standard protocol during bolus administration of intravenous contrast. Multiplanar reconstructed images and MIPs were obtained and reviewed to evaluate the vascular anatomy. CONTRAST:  119mL OMNIPAQUE IOHEXOL 350 MG/ML SOLN COMPARISON:  CT 09/09/2019 FINDINGS: CTA CHEST FINDINGS Cardiovascular: Aortic root at the sinuses of Valsalva measures 3.7 cm. Ascending thoracic aorta measures 3.2 cm without dissection. Typical three-vessel arch anatomy. Great vessels are patent. Proximal descending thoracic aorta measures 2.8 cm. Distal descending thoracic aorta measures 2.5 cm.Main pulmonary arteries are patent without large filling defects. Heart size is normal. No significant pericardial effusion. Mediastinum/Nodes: Small hiatal hernia. No significant chest lymphadenopathy. Lungs/Pleura: Scarring at the lung apices. Small amount of debris or mucus in the trachea. No large pleural effusions. Stable punctate nodule in the superior segment  of the left lower lobe on sequence 8 image 47. No significant airspace disease or consolidation in the  lungs. Negative for pneumothorax. Few patchy densities in the right upper lobe near the right minor fissure are suggestive for atelectasis or post inflammatory changes based on the configuration, sequence 6, image 37. Musculoskeletal: Old bilateral rib fractures. No acute bone abnormality. Review of the MIP images confirms the above findings. CTA ABDOMEN AND PELVIS FINDINGS VASCULAR Aorta: Atherosclerotic disease in the abdominal aorta without aneurysm, dissection or significant stenosis. Celiac: Celiac trunk is widely patent. Splenic artery and left gastric artery are patent. Gastroduodenal artery originates directly from the celiac trunk. Proper hepatic artery comes off the SMA. SMA: SMA is widely patent. Renals: Both renal arteries are patent without evidence of aneurysm, dissection, vasculitis, fibromuscular dysplasia or significant stenosis. IMA: Patent Inflow: Atherosclerotic plaque in the iliac arteries without significant stenosis. Common, internal and external iliac arteries are patent bilaterally. Close to 50% narrowing in the proximal left common femoral artery. Proximal femoral arteries are patent bilaterally. Veins: No obvious venous abnormality within the limitations of this arterial phase study. Review of the MIP images confirms the above findings. NON-VASCULAR Hepatobiliary: No appearance of the gallbladder. No acute abnormality to the liver. No significant biliary dilatation. Pancreas: Unremarkable. No pancreatic ductal dilatation or surrounding inflammatory changes. Spleen: Normal in size without focal abnormality. Adrenals/Urinary Tract: Normal appearance of the adrenal glands. Normal appearance of both kidneys without hydronephrosis. Normal appearance of the urinary bladder. Stomach/Bowel: Stomach is within normal limits. Appendix appears normal. No evidence of bowel wall thickening, distention, or inflammatory changes. Small hiatal hernia. Lymphatic: No lymph node enlargement in the abdomen or  pelvis. Reproductive: Stable appearance of the prostate and seminal vesicles. Other: Negative for ascites.  Negative for free air. Musculoskeletal: No acute bone abnormality. Review of the MIP images confirms the above findings. IMPRESSION: 1. No acute abnormality in the chest, abdomen or pelvis. Specifically, negative for an acute aortic injury or dissection. 2. Main visceral arteries are patent. No evidence for mesenteric ischemia. No acute bowel abnormality. 3. Old bilateral rib fractures. 4. New small densities in the anterior right upper lobe near the right minor fissure. Suspect these are postinflammatory or atelectasis. 5.  Aortic Atherosclerosis (ICD10-I70.0). Electronically Signed   By: Markus Daft M.D.   On: 05/25/2020 15:29      IMPRESSION:   *   Microcytic anemia.  FOBT positive maroon stool in patient who drinks excessively. 2 units PRBCs ordered.  Protonix drip initiated.  *   Alcohol abuse.  No evidence for liver disease on today's CT imaging. Has not had a drink for a few days.  CIWA protocol in place.  *   Dysphagia.  *    epigastric/left upper quadrant pain.  No significant pathology of abdomen or pelvis on CT.    PLAN:     *   EGD/colonoscopy tomorrow, patient agreeable to proceed.  *    Clear liquids.  See orders related to bowel prep.   Azucena Freed  05/25/2020, 4:03 PM Phone [phone]   Attending physician's note   I have reviewed the chart and didn't examined the patient as pt was fast asleep. I agree with the Advanced Practitioner's note, impression and recommendations.   GI Bleed- upper vs lower. H/O NSAIDs and ETOH. Hb 5.5 on Adm, s/p 1U to 6.5. Neg CTA for bleeding or liver abn.  IDA ?  Intermittent dysphagia/Epi pain/LUQ pain Wt loss  Protein calorie malnutrition  Plan: -Transfuse to Hb>7 -IV Protonix -EGD/colon in AM. -Watch for alcohol withdrawal. -CTA reviewed. -Stop all nonsteroidals and alcohol.   Carmell Austria, MD [facility]  [facility] [phone]

## 2020-05-25 NOTE — ED Provider Notes (Signed)
[place] EMERGENCY DEPARTMENT Provider Note   CSN: [id number] Arrival date & time: 05/25/20  1018     History Chief Complaint  Patient presents with  . Dizziness    Marc Mcdonald is a 65 y.o. male.  Marc Mcdonald is a 65 y.o. male with history of COPD, but no other known medical history, patient has not seen a doctor in about 20 years, who arrives for evaluation of 1 week of dizziness and lightheadedness.  Patient went to establish care with a primary care provider today but was found to be hypotensive with systolic blood pressure in the 80s and tachycardic and so was sent to the emergency department for further evaluation.  Patient reports that for the past week he has been feeling very weak, dizzy and lightheaded.  He reports if he gets up and tries to walk any distance at all he feels like he is going to pass out and needs to sit down.  He has not passed out that he reports denies falls or hitting his head, but his daughter is concerned he likely has passed out.  The patient also reports some dyspnea on exertion, he feels like with activity he has difficulty catching his breath.  He denies shortness of breath at rest.  He initially attributed this to his COPD, but feels like it is worse than what he has experienced before.  He denies chest pain but reports persistent pain over his lateral ribs, back in July he had a fall where he broke multiple ribs, but reports this pain has been worse recently in the past week or 2.  He also reports some epigastric abdominal pain.  He denies vomiting, reports he is intermittently had some diarrhea has not noticed any blood in his stools or melena.  No fevers or chills.  Denies urinary symptoms.  He has intermittently had some swelling in his legs but reports this is most recently improved.  He also complains of pain in his neck and thoracic back he reports has been present since the fall over the summer where he broke ribs but reports it  has been worse more recently.  Patient drinks 12-15 beers daily, and last drink yesterday.  He denies other substance use.  Reports he has not gone to the doctor because he just does not want to go anywhere.  His daughter is very concerned and convinced him to see a primary care doctor recently. His daughter also notes his color hasn't been good, and he looks much more pale than usual.        Past Medical History:  Diagnosis Date  . COPD (chronic obstructive pulmonary disease) [facility])     Patient Active Problem List   Diagnosis Date Noted  . Dyspnea 05/25/2020  . Hypotension 05/25/2020  . Tachycardia 05/25/2020  . Phase of life problem 05/25/2020  . Pruritus 05/25/2020  . Surgical follow-up care 10/12/2019  . Left-sided chest wall pain 09/14/2019  . Closed traumatic fracture of ribs of left side with pneumothorax 09/09/2019    No past surgical history on file.     Family History  Family history unknown: Yes    Social History   Tobacco Use  . Smoking status: Former Smoker    Types: Cigarettes    Quit date: 08/25/2019    Years since quitting: 0.7  . Smokeless tobacco: Never Used  Vaping Use  . Vaping Use: Never used  Substance Use Topics  . Alcohol use: Yes  Comment: daily  . Drug use: Never    Home Medications Prior to Admission medications   Medication Sig Start Date End Date Taking? Authorizing Provider  ibuprofen (ADVIL) 200 MG tablet Take 400 mg by mouth every 4 (four) hours as needed for moderate pain.   Yes [provider]    Allergies    Patient has no known allergies.  Review of Systems   Review of Systems  Constitutional: Positive for fatigue. Negative for chills and fever.  HENT: Negative.   Respiratory: Positive for shortness of breath. Negative for cough.   Cardiovascular: Negative for chest pain, palpitations and leg swelling.  Gastrointestinal: Positive for abdominal pain and diarrhea. Negative for blood in stool, nausea and vomiting.   Genitourinary: Negative for dysuria.  Musculoskeletal: Positive for back pain and neck pain. Negative for arthralgias.  Skin: Negative for color change and rash.  Neurological: Positive for dizziness, weakness (Generalized) and light-headedness. Negative for syncope and headaches.  All other systems reviewed and are negative.   Physical Exam Updated Vital Signs BP 106/70 (BP Location: Right Arm)   Pulse (!) 113   Temp 97.6 F (36.4 C) (Oral)   Resp (!) 21   SpO2 100%   Physical Exam Vitals and nursing note reviewed.  Constitutional:      General: He is not in acute distress.    Appearance: Normal appearance. He is well-developed. He is ill-appearing. He is not diaphoretic.     Comments: Patient is alert, appears quite ill, very weak and pale on arrival  HENT:     Head: Normocephalic and atraumatic.     Mouth/Throat:     Mouth: Mucous membranes are dry.     Pharynx: Oropharynx is clear.     Comments: Pallor of mucosal membranes noted Eyes:     General:        Right eye: No discharge.        Left eye: No discharge.     Extraocular Movements: Extraocular movements intact.     Pupils: Pupils are equal, round, and reactive to light.     Comments: Conjunctivae pale  Neck:     Comments: There is some midline cervical spine tenderness as well as tenderness over the left paraspinal muscles without palpable deformity Cardiovascular:     Rate and Rhythm: Regular rhythm. Tachycardia present.     Heart sounds: Normal heart sounds. No murmur heard. No friction rub. No gallop.      Comments: Tachycardia on arrival with regular rhythm Pulmonary:     Effort: Pulmonary effort is normal. No respiratory distress.     Breath sounds: Normal breath sounds. No wheezing or rales.     Comments: On arrival respirations respirations unlabored the patient is mildly tachypneic, but satting well on room air, breath sounds slightly diminished bilaterally without wheezes, rales or rhonchi Abdominal:      General: Bowel sounds are normal. There is no distension.     Palpations: Abdomen is soft. There is no mass.     Tenderness: There is abdominal tenderness. There is no guarding.     Comments: Abdomen is soft, nondistended, bowel sounds present throughout, there is some mild epigastric tenderness, all other quadrants nontender, no peritoneal signs or guarding  Genitourinary:    Comments: On rectal exam there is no visible gross blood, soft brown stool present in rectal vault Musculoskeletal:        General: No deformity.     Cervical back: Neck supple. Tenderness present.  Right lower leg: No edema.     Left lower leg: No edema.  Skin:    General: Skin is warm and dry.     Capillary Refill: Capillary refill takes less than 2 seconds.     Coloration: Skin is pale.  Neurological:     Mental Status: He is alert.     Coordination: Coordination normal.     Comments: Speech is clear, able to follow commands CN III-XII intact Normal strength in upper and lower extremities bilaterally including dorsiflexion and plantar flexion, strong and equal grip strength Sensation normal to light and sharp touch Moves extremities without ataxia, coordination intact  Psychiatric:     Comments: Patient is quite irritable, but is cooperating with evaluation     ED Results / Procedures / Treatments   Labs (all labs ordered are listed, but only abnormal results are displayed) Labs Reviewed  BASIC METABOLIC PANEL - Abnormal; Notable for the following components:      Result Value   Sodium 125 (*)    Chloride 95 (*)    CO2 16 (*)    Glucose, Bld 135 (*)    BUN 5 (*)    Calcium 8.1 (*)    All other components within normal limits  HEPATIC FUNCTION PANEL - Abnormal; Notable for the following components:   Albumin 2.4 (*)    Total Bilirubin 2.1 (*)    Indirect Bilirubin 1.9 (*)    All other components within normal limits  CBC WITH DIFFERENTIAL/PLATELET - Abnormal; Notable for the following  components:   WBC 11.7 (*)    RBC 2.76 (*)    Hemoglobin 5.5 (*)    HCT 21.3 (*)    MCV 77.2 (*)    MCH 19.9 (*)    MCHC 25.8 (*)    RDW 22.4 (*)    Platelets 424 (*)    nRBC 0.3 (*)    Neutro Abs 8.7 (*)    Monocytes Absolute 1.6 (*)    Abs Immature Granulocytes 0.13 (*)    All other components within normal limits  LACTIC ACID, PLASMA - Abnormal; Notable for the following components:   Lactic Acid, Venous 5.3 (*)    All other components within normal limits  BASIC METABOLIC PANEL - Abnormal; Notable for the following components:   Sodium 130 (*)    CO2 20 (*)    BUN 6 (*)    Creatinine, Ser 0.58 (*)    Calcium 7.7 (*)    All other components within normal limits  HEPATIC FUNCTION PANEL - Abnormal; Notable for the following components:   Total Protein 5.6 (*)    Albumin 2.0 (*)    Total Bilirubin 7.0 (*)    Bilirubin, Direct 0.4 (*)    Indirect Bilirubin 6.6 (*)    All other components within normal limits  CBC - Abnormal; Notable for the following components:   RBC 3.00 (*)    Hemoglobin 6.9 (*)    HCT 23.2 (*)    MCV 77.3 (*)    MCH 23.0 (*)    MCHC 29.7 (*)    RDW 21.1 (*)    All other components within normal limits  POC OCCULT BLOOD, ED - Abnormal; Notable for the following components:   Fecal Occult Bld POSITIVE (*)    All other components within normal limits  I-STAT VENOUS BLOOD GAS, ED - Abnormal; Notable for the following components:   pCO2, Ven 31.9 (*)    pO2, Ven 86.0 (*)  Bicarbonate 19.4 (*)    TCO2 20 (*)    Acid-base deficit 5.0 (*)    Sodium 127 (*)    Calcium, Ion 1.07 (*)    HCT 19.0 (*)    Hemoglobin 6.5 (*)    All other components within normal limits  SARS CORONAVIRUS 2 (TAT 6-24 HRS)  ETHANOL  LACTIC ACID, PLASMA  OSMOLALITY  URINALYSIS, ROUTINE W REFLEX MICROSCOPIC  BLOOD GAS, VENOUS  SODIUM, URINE, RANDOM  OSMOLALITY, URINE  HEMOGLOBIN AND HEMATOCRIT, BLOOD  CEA  HEMOGLOBIN AND HEMATOCRIT, BLOOD  TYPE AND SCREEN  PREPARE  RBC (CROSSMATCH)  ABO/RH  PREPARE RBC (CROSSMATCH)  SURGICAL PATHOLOGY  TROPONIN I (HIGH SENSITIVITY)  TROPONIN I (HIGH SENSITIVITY)    EKG None  Radiology CT Head Wo Contrast  Result Date: 05/25/2020 CLINICAL DATA:  Head trauma, minor. Neck trauma. Dizziness and lightheadedness for 1 week, spine pain radiating to neck. EXAM: CT HEAD WITHOUT CONTRAST CT CERVICAL SPINE WITHOUT CONTRAST TECHNIQUE: Multidetector CT imaging of the head and cervical spine was performed following the standard protocol without intravenous contrast. Multiplanar CT image reconstructions of the cervical spine were also generated. COMPARISON:  CT head/maxillofacial 12/14/2010. FINDINGS: CT HEAD FINDINGS Brain: Mild-to-moderate cerebral atrophy. Minimal patchy and ill-defined hypoattenuation within the cerebral white matter is nonspecific, but compatible with chronic small vessel ischemic disease. There is no acute intracranial hemorrhage. No demarcated cortical infarct. No extra-axial fluid collection. No evidence of intracranial mass. No midline shift. Vascular: No hyperdense vessel.  Atherosclerotic calcifications Skull: Normal. Negative for fracture or focal lesion. Sinuses/Orbits: Visualized orbits show no acute finding. Mucous retention cysts within the posterior right ethmoid air cell and within the right sphenoid sinus. There is otherwise only trace scattered paranasal sinus mucosal thickening at the imaged levels. CT CERVICAL SPINE FINDINGS Mildly motion degraded examination. Alignment: Reversal of the expected cervical lordosis. 1-2 mm C3-C4 and C4-C5 grade 1 anterolisthesis. 1-2 mm C5-C6 grade 1 retrolisthesis. Skull base and vertebrae: The basion-dental and atlanto-dental intervals are maintained.No evidence of acute fracture to the cervical spine. Soft tissues and spinal canal: No prevertebral fluid or swelling. No visible canal hematoma. Disc levels: Cervical spondylosis with shallow multilevel disc bulges/central  disc protrusions, uncovertebral hypertrophy and facet arthrosis. Facet joint ankylosis on the left at C4-C5. Disc space narrowing is greatest at C5-C6 and C6-C7 (advanced at these levels). No appreciable high-grade spinal canal stenosis. No high-grade bony neural foraminal narrowing. Upper chest: No consolidation within the imaged lung apices. No visible pneumothorax. Right apical bullae. IMPRESSION: CT head: 1. No evidence of acute intracranial abnormality. 2. Mild-to-moderate cerebral atrophy. 3. Mild cerebral white matter chronic small vessel ischemic disease. 4. Mild paranasal sinus disease as described. CT cervical spine: 1. Mildly motion degraded exam. 2. No evidence of acute fracture to the cervical spine. 3. Nonspecific reversal of the expected cervical lordosis. 4. Mild grade 1 spondylolisthesis at C3-C4, C4-C5 and C5-C6 as detailed. 5. Cervical spondylosis as described. Electronically Signed   By: Kellie Simmering DO   On: 05/25/2020 15:33   CT Cervical Spine Wo Contrast  Result Date: 05/25/2020 CLINICAL DATA:  Head trauma, minor. Neck trauma. Dizziness and lightheadedness for 1 week, spine pain radiating to neck. EXAM: CT HEAD WITHOUT CONTRAST CT CERVICAL SPINE WITHOUT CONTRAST TECHNIQUE: Multidetector CT imaging of the head and cervical spine was performed following the standard protocol without intravenous contrast. Multiplanar CT image reconstructions of the cervical spine were also generated. COMPARISON:  CT head/maxillofacial 12/14/2010. FINDINGS: CT HEAD FINDINGS Brain: Mild-to-moderate cerebral  atrophy. Minimal patchy and ill-defined hypoattenuation within the cerebral white matter is nonspecific, but compatible with chronic small vessel ischemic disease. There is no acute intracranial hemorrhage. No demarcated cortical infarct. No extra-axial fluid collection. No evidence of intracranial mass. No midline shift. Vascular: No hyperdense vessel.  Atherosclerotic calcifications Skull: Normal. Negative  for fracture or focal lesion. Sinuses/Orbits: Visualized orbits show no acute finding. Mucous retention cysts within the posterior right ethmoid air cell and within the right sphenoid sinus. There is otherwise only trace scattered paranasal sinus mucosal thickening at the imaged levels. CT CERVICAL SPINE FINDINGS Mildly motion degraded examination. Alignment: Reversal of the expected cervical lordosis. 1-2 mm C3-C4 and C4-C5 grade 1 anterolisthesis. 1-2 mm C5-C6 grade 1 retrolisthesis. Skull base and vertebrae: The basion-dental and atlanto-dental intervals are maintained.No evidence of acute fracture to the cervical spine. Soft tissues and spinal canal: No prevertebral fluid or swelling. No visible canal hematoma. Disc levels: Cervical spondylosis with shallow multilevel disc bulges/central disc protrusions, uncovertebral hypertrophy and facet arthrosis. Facet joint ankylosis on the left at C4-C5. Disc space narrowing is greatest at C5-C6 and C6-C7 (advanced at these levels). No appreciable high-grade spinal canal stenosis. No high-grade bony neural foraminal narrowing. Upper chest: No consolidation within the imaged lung apices. No visible pneumothorax. Right apical bullae. IMPRESSION: CT head: 1. No evidence of acute intracranial abnormality. 2. Mild-to-moderate cerebral atrophy. 3. Mild cerebral white matter chronic small vessel ischemic disease. 4. Mild paranasal sinus disease as described. CT cervical spine: 1. Mildly motion degraded exam. 2. No evidence of acute fracture to the cervical spine. 3. Nonspecific reversal of the expected cervical lordosis. 4. Mild grade 1 spondylolisthesis at C3-C4, C4-C5 and C5-C6 as detailed. 5. Cervical spondylosis as described. Electronically Signed   By: Kellie Simmering DO   On: 05/25/2020 15:33   DG Chest Port 1 View  Result Date: 05/25/2020 CLINICAL DATA:  Chest pain EXAM: PORTABLE CHEST 1 VIEW COMPARISON:  10/12/2019 FINDINGS: The heart size and mediastinal contours are  within normal limits. Emphysema. Both lungs are clear. No pleural effusion or pneumothorax. Chronic bilateral rib fractures. IMPRESSION: No acute process in the chest. Electronically Signed   By: Macy Mis M.D.   On: 05/25/2020 11:49   CT Angio Chest/Abd/Pel for Dissection W and/or Wo Contrast  Result Date: 05/25/2020 CLINICAL DATA:  65 year old with chest pain. Evaluate for an aortic dissection. EXAM: CT ANGIOGRAPHY CHEST, ABDOMEN AND PELVIS TECHNIQUE: Non-contrast CT of the chest was initially obtained. Multidetector CT imaging through the chest, abdomen and pelvis was performed using the standard protocol during bolus administration of intravenous contrast. Multiplanar reconstructed images and MIPs were obtained and reviewed to evaluate the vascular anatomy. CONTRAST:  143m OMNIPAQUE IOHEXOL 350 MG/ML SOLN COMPARISON:  CT 09/09/2019 FINDINGS: CTA CHEST FINDINGS Cardiovascular: Aortic root at the sinuses of Valsalva measures 3.7 cm. Ascending thoracic aorta measures 3.2 cm without dissection. Typical three-vessel arch anatomy. Great vessels are patent. Proximal descending thoracic aorta measures 2.8 cm. Distal descending thoracic aorta measures 2.5 cm.Main pulmonary arteries are patent without large filling defects. Heart size is normal. No significant pericardial effusion. Mediastinum/Nodes: Small hiatal hernia. No significant chest lymphadenopathy. Lungs/Pleura: Scarring at the lung apices. Small amount of debris or mucus in the trachea. No large pleural effusions. Stable punctate nodule in the superior segment of the left lower lobe on sequence 8 image 47. No significant airspace disease or consolidation in the lungs. Negative for pneumothorax. Few patchy densities in the right upper lobe near the right minor  fissure are suggestive for atelectasis or post inflammatory changes based on the configuration, sequence 6, image 37. Musculoskeletal: Old bilateral rib fractures. No acute bone abnormality.  Review of the MIP images confirms the above findings. CTA ABDOMEN AND PELVIS FINDINGS VASCULAR Aorta: Atherosclerotic disease in the abdominal aorta without aneurysm, dissection or significant stenosis. Celiac: Celiac trunk is widely patent. Splenic artery and left gastric artery are patent. Gastroduodenal artery originates directly from the celiac trunk. Proper hepatic artery comes off the SMA. SMA: SMA is widely patent. Renals: Both renal arteries are patent without evidence of aneurysm, dissection, vasculitis, fibromuscular dysplasia or significant stenosis. IMA: Patent Inflow: Atherosclerotic plaque in the iliac arteries without significant stenosis. Common, internal and external iliac arteries are patent bilaterally. Close to 50% narrowing in the proximal left common femoral artery. Proximal femoral arteries are patent bilaterally. Veins: No obvious venous abnormality within the limitations of this arterial phase study. Review of the MIP images confirms the above findings. NON-VASCULAR Hepatobiliary: No appearance of the gallbladder. No acute abnormality to the liver. No significant biliary dilatation. Pancreas: Unremarkable. No pancreatic ductal dilatation or surrounding inflammatory changes. Spleen: Normal in size without focal abnormality. Adrenals/Urinary Tract: Normal appearance of the adrenal glands. Normal appearance of both kidneys without hydronephrosis. Normal appearance of the urinary bladder. Stomach/Bowel: Stomach is within normal limits. Appendix appears normal. No evidence of bowel wall thickening, distention, or inflammatory changes. Small hiatal hernia. Lymphatic: No lymph node enlargement in the abdomen or pelvis. Reproductive: Stable appearance of the prostate and seminal vesicles. Other: Negative for ascites.  Negative for free air. Musculoskeletal: No acute bone abnormality. Review of the MIP images confirms the above findings. IMPRESSION: 1. No acute abnormality in the chest, abdomen or  pelvis. Specifically, negative for an acute aortic injury or dissection. 2. Main visceral arteries are patent. No evidence for mesenteric ischemia. No acute bowel abnormality. 3. Old bilateral rib fractures. 4. New small densities in the anterior right upper lobe near the right minor fissure. Suspect these are postinflammatory or atelectasis. 5.  Aortic Atherosclerosis (ICD10-I70.0). Electronically Signed   By: Markus Daft M.D.   On: 05/25/2020 15:29    Procedures .Critical Care Performed by: Jacqlyn Larsen, PA-C Authorized by: Jacqlyn Larsen, PA-C   Critical care provider statement:    Critical care time (minutes):  45   Critical care was necessary to treat or prevent imminent or life-threatening deterioration of the following conditions:  Circulatory failure   Critical care was time spent personally by me on the following activities:  Discussions with consultants, evaluation of patient's response to treatment, examination of patient, ordering and performing treatments and interventions, ordering and review of laboratory studies, ordering and review of radiographic studies, pulse oximetry, re-evaluation of patient's condition, obtaining history from patient or surrogate, review of old charts and development of treatment plan with patient or surrogate   Care discussed with: admitting provider       Medications Ordered in ED Medications  0.9 %  sodium chloride infusion ( Intravenous MAR Unhold 05/26/20 1140)  LORazepam (ATIVAN) injection 0-4 mg ( Intravenous MAR Unhold 05/26/20 1140)    Or  LORazepam (ATIVAN) tablet 0-4 mg ( Oral MAR Unhold 05/26/20 1140)  LORazepam (ATIVAN) injection 0-4 mg ( Intravenous MAR Unhold 05/26/20 1140)    Or  LORazepam (ATIVAN) tablet 0-4 mg ( Oral MAR Unhold 05/26/20 1140)  thiamine tablet 100 mg ( Oral MAR Unhold 05/26/20 1140)    Or  thiamine (B-1) injection 100 mg ( Intravenous  MAR Unhold 05/26/20 1140)  acetaminophen (TYLENOL) tablet 650 mg ( Oral MAR Unhold 05/26/20 1140)     Or  acetaminophen (TYLENOL) suppository 650 mg ( Rectal MAR Unhold 05/26/20 1140)  senna (SENOKOT) tablet 8.6 mg ( Oral MAR Unhold 05/26/20 1140)  ipratropium-albuterol (DUONEB) 0.5-2.5 (3) MG/3ML nebulizer solution 3 mL ( Nebulization MAR Unhold 05/26/20 1140)  0.9 %  sodium chloride infusion (Manually program via Guardrails IV Fluids) ( Intravenous MAR Unhold 05/26/20 1140)  pantoprazole (PROTONIX) EC tablet 40 mg (has no administration in time range)  sucralfate (CARAFATE) 1 GM/10ML suspension 1 g (has no administration in time range)  sodium chloride 0.9 % bolus 500 mL (0 mLs Intravenous Stopped 05/25/20 1449)  fentaNYL (SUBLIMAZE) injection 50 mcg (50 mcg Intravenous Given 05/25/20 1450)  sodium chloride 0.9 % bolus 1,000 mL (0 mLs Intravenous Stopped 05/25/20 1907)  pantoprazole (PROTONIX) 80 mg in sodium chloride 0.9 % 100 mL IVPB (0 mg Intravenous Stopped 05/25/20 2230)  iohexol (OMNIPAQUE) 350 MG/ML injection 100 mL (100 mLs Intravenous Contrast Given 05/25/20 1444)  peg 3350 powder (MOVIPREP) kit 100 g (100 g Oral Given 05/26/20 0005)    And  peg 3350 powder (MOVIPREP) kit 100 g (100 g Oral Given 05/26/20 0600)    ED Course  I have reviewed the triage vital signs and the nursing notes.  Pertinent labs & imaging results that were available during my care of the patient were reviewed by me and considered in my medical decision making (see chart for details).    MDM Rules/Calculators/A&P                          65 year old male presents from new PCP appointment found to be hypotensive and tachycardic reporting a week of weakness, lightheadedness and fatigue also complaining of some epigastric abdominal pain and pain over his lateral ribs and neck that has been ongoing since a prior fall with rib fractures.  Patient does not typically see a doctor.  Drinks 12-15 beers a day.  On arrival is initially tachycardic to 118 with blood pressure 86/50, although on recheck blood pressure is improved with  systolic in the low 956L.  Patient appears very pale and ill.  High clinical concern for potential anemia and GI bleed.  Patient has not noted obvious bleeding in his stool.  Patient also with pain and poor historian unclear if patient has had any syncopal episodes or fallen.  This involves an extensive number of treatment options, and is a complaint that carries with it a high risk of complications and morbidity.   Additional history obtained from Daughter. Previous records obtained and reviewed  Lab Tests:  I Ordered, reviewed, and interpreted labs, which included:  CBC: hgb of 5.5, type and screen pending, transfusion ordered, minimla leukocytosis of 11.7 BMP: Na 125, CO2 16, normal renal function, NS fluid bolus given LFT: T bili 2.1, but otherwise unremarkable Lactic: significantly elevated at 5.3, although pt without fever or infectious symptoms, question dehydration or bowel ischemia, will get CTA, fluids given, but discussed with Dr. Reather Converse will hold off on ABX Troponin: neg FOBT: POSITIVE VBG: reassuring pH  Imaging Studies ordered:  I ordered imaging studies which included CT head and c-spine, CTA chest abd pelvis, I independently visualized and interpreted imaging which showed  CT head and c-spine: no acute abnormalities, degenerative changes in c-spine CTA:no acute abnormalities, no bowel ischemia, old rib fractures notes, some atelectasis  ED Course:   Critical  interventions: 2 Units RBCs given for symptomatic anemia and hypotension in setting of GI bleeding   I consulted PA Garner Nash with [facility] and discussed lab and imaging findings, they will see pt in consult  Case discussed with Dr. Alfonse Spruce with Internal Medicine teaching service and they will see and admit the patient.  Portions of this note were generated with Lobbyist. Dictation errors may occur despite best attempts at proofreading.   Final Clinical Impression(s) / ED Diagnoses Final  diagnoses:  Symptomatic anemia  Gastrointestinal hemorrhage, unspecified gastrointestinal hemorrhage type    Rx / DC Orders ED Discharge Orders    None       Janet Berlin 05/26/20 1309    Elnora Morrison, MD 05/27/20 [phone]

## 2020-05-25 NOTE — ED Provider Notes (Incomplete)
I provided a substantive portion of the care of this patient.  I personally performed the entirety of the history, exam and medical decision making for this encounter. {Remember to document shared critical care using "edcritical" dot phrase:1}

## 2020-05-25 NOTE — ED Notes (Signed)
Pt made aware urine is needed

## 2020-05-25 NOTE — ED Notes (Signed)
Attempted to give reportx1 

## 2020-05-25 NOTE — Progress Notes (Signed)
New Patient Office Visit  Subjective:  Patient ID: Marc Mcdonald, male    DOB: February 11, 1956  Age: 65 y.o. MRN: [id number]  CC:  Chief Complaint  Patient presents with  . Establish Care  . Rash  . Dizziness    Light headed upon standing and cannot walk 5 feet without being short of breath  . Cough    Pt has recent diagnosis of COPD (June 2021)    HPI Marc Mcdonald is a 65 year old male presenting to establish in clinic.  He has current concerns related to ongoing cough, dizziness, exertional dyspnea, skin itching.  Patient reports that he has not seen a primary care provider as an adult.  He is accompanied today by his daughter.  Patient has a long history of tobacco use with reported diagnosis of COPD (although does not appear that patient has had pulmonary function testing completed), reports that he did quit smoking about 1 year ago.  Also with notable alcohol use in the past as he reports drinking about 12-15 beers per day, currently reports that he drinks about 3 beers per day -lower amount is partially related to patient's increased dyspnea with ambulating and thus not able to easily get himself.  From the fridge.  Over the past week, he has had increasing fatigue and dyspnea as well as increasing cough.  Cough is intermittently productive of sputum.  He has associated lightheadedness and dizziness when he tries to stand up and ambulate.  Is not able to walk more than a few feet before feeling significantly dyspneic and needing to sit down.  Also with some increased swelling in left supraclavicular area, indicates this was noticed after a fall last year in which he fractured multiple ribs and had an associated pneumothorax.  Patient has also had intermittent flares of a rash and itching on his trunk.  This has been going on for some time.  Previously he has been evaluated at a local urgent care and prescribed various medications and topical treatments to assist with symptoms.   Past  Medical History:  Diagnosis Date  . COPD (chronic obstructive pulmonary disease) (HCC)     History reviewed. No pertinent surgical history.  Family History  Family history unknown: Yes    Social History   Socioeconomic History  . Marital status: Divorced    Spouse name: Not on file  . Number of children: Not on file  . Years of education: Not on file  . Highest education level: Not on file  Occupational History  . Not on file  Tobacco Use  . Smoking status: Former Smoker    Types: Cigarettes    Quit date: 08/25/2019    Years since quitting: 0.7  . Smokeless tobacco: Never Used  Vaping Use  . Vaping Use: Never used  Substance and Sexual Activity  . Alcohol use: Yes    Comment: daily  . Drug use: Never  . Sexual activity: Not Currently  Other Topics Concern  . Not on file  Social History Narrative  . Not on file   Social Determinants of Health   Financial Resource Strain: Not on file  Food Insecurity: Not on file  Transportation Needs: Not on file  Physical Activity: Not on file  Stress: Not on file  Social Connections: Not on file  Intimate Partner Violence: Not on file    Objective:   Today's Vitals: BP (!) 86/50   Pulse (!) 118   Ht 5\' 11"  (1.803 m)  Wt 126 lb (57.2 kg)   SpO2 96%   BMI 17.57 kg/m   Physical Exam  65 year old male in mild distress, frail-appearing Cardiovascular exam with increased rate and normal rhythm, murmurs appreciated Pulmonary exam with distant breath sounds, no obvious wheezes Skin exam of the trunk reveals some excoriations, healing scabs  Assessment & Plan:   Problem List Items Addressed This Visit      Cardiovascular and Mediastinum   Hypotension    Low blood pressure here in the office today, suspect this is due to hypovolemia and contributing to ongoing symptoms of lightheaded and dizziness, particularly with standing and ambulation Given associated hypotension, dyspnea, tachycardia, recommend the patient  presents to the emergency department for further evaluation, likely volume resuscitation        Musculoskeletal and Integument   Pruritus    No obvious rash observed on the skin, patient with excoriations and some scabs from chronic scratching Symptoms of pruritus possibly related to underlying liver disease, given that patient will be presenting to ED, likely will be evaluated on labs there, if liver enzymes are not checked, will check as an outpatient        Other   Dyspnea    Chart indicates diagnosis of COPD, however not able to find evidence of prior PFTs completed, thus uncertain of true status of COPD or degree of severity Clinically, I do believe the patient has chronic lung disease and likely COPD due to his history of smoking and current symptoms Given current presentation of increased dyspnea, cough, hypotension, tachycardia, feel that patient should present to emergency room for further evaluation and management Further assessment as an outpatient likely to include PFTs, possible pulmonology referral due to suspected advanced age of lung disease, may require CT scan due to patient's increased risk of lung cancer      Tachycardia    Likely related to patient's overall decompensation and current hypotension; suspect this is due to general hypovolemia As above, recommend evaluation in emergency department      Phase of life problem    PHQ-9 with a score of 13 out of 27.  Most symptomatic questions were related to sleep, energy, poor appetite.  Current symptom of exertional dyspnea likely playing a significant role with this Will reassess at future visits as we are hopefully able to improve on his dyspnea with further evaluation and appropriate treatment         No facility-administered encounter medications on file as of 05/25/2020.   Outpatient Encounter Medications as of 05/25/2020  Medication Sig  . ibuprofen (ADVIL) 200 MG tablet Take 200 mg by mouth every 6 (six) hours  as needed for moderate pain. (Patient not taking: Reported on 09/14/2019)  . [DISCONTINUED] Multiple Vitamin (MULTIVITAMIN WITH MINERALS) TABS tablet Take 1 tablet by mouth daily. (Patient not taking: Reported on 05/25/2020)  . [DISCONTINUED] oxyCODONE (OXY IR/ROXICODONE) 5 MG immediate release tablet Take 1 tablet (5 mg total) by mouth every 6 (six) hours as needed for breakthrough pain. (Patient not taking: No sig reported)   Spent 70 minutes on this patient encounter, including preparation, chart review, face-to-face counseling with patient and coordination of care, and documentation of encounter  Follow-up: Return in about 1 week (around 06/01/2020).   Ruari Duggan J [place], MD

## 2020-05-26 ENCOUNTER — Encounter (HOSPITAL_COMMUNITY)
Admission: EM | Disposition: A | Discharge status: Home Health | Payer: Self-pay | Source: Home / Self Care | Attending: Internal Medicine

## 2020-05-26 ENCOUNTER — Inpatient Hospital Stay (HOSPITAL_COMMUNITY): Payer: Medicare Other | Admitting: Certified Registered Nurse Anesthetist

## 2020-05-26 ENCOUNTER — Inpatient Hospital Stay (HOSPITAL_COMMUNITY): Payer: Medicare Other

## 2020-05-26 ENCOUNTER — Encounter (HOSPITAL_COMMUNITY): Payer: Self-pay | Admitting: Internal Medicine

## 2020-05-26 DIAGNOSIS — K269 Duodenal ulcer, unspecified as acute or chronic, without hemorrhage or perforation: Secondary | ICD-10-CM | POA: Diagnosis present

## 2020-05-26 DIAGNOSIS — D500 Iron deficiency anemia secondary to blood loss (chronic): Secondary | ICD-10-CM | POA: Diagnosis not present

## 2020-05-26 DIAGNOSIS — R1310 Dysphagia, unspecified: Secondary | ICD-10-CM

## 2020-05-26 DIAGNOSIS — K2990 Gastroduodenitis, unspecified, without bleeding: Secondary | ICD-10-CM | POA: Diagnosis present

## 2020-05-26 DIAGNOSIS — K5669 Other partial intestinal obstruction: Secondary | ICD-10-CM

## 2020-05-26 DIAGNOSIS — J449 Chronic obstructive pulmonary disease, unspecified: Secondary | ICD-10-CM

## 2020-05-26 DIAGNOSIS — E871 Hypo-osmolality and hyponatremia: Secondary | ICD-10-CM

## 2020-05-26 DIAGNOSIS — C20 Malignant neoplasm of rectum: Secondary | ICD-10-CM

## 2020-05-26 DIAGNOSIS — K625 Hemorrhage of anus and rectum: Secondary | ICD-10-CM

## 2020-05-26 DIAGNOSIS — K222 Esophageal obstruction: Secondary | ICD-10-CM

## 2020-05-26 DIAGNOSIS — K6289 Other specified diseases of anus and rectum: Secondary | ICD-10-CM | POA: Diagnosis not present

## 2020-05-26 DIAGNOSIS — K2210 Ulcer of esophagus without bleeding: Secondary | ICD-10-CM | POA: Diagnosis not present

## 2020-05-26 DIAGNOSIS — F1010 Alcohol abuse, uncomplicated: Secondary | ICD-10-CM

## 2020-05-26 DIAGNOSIS — K219 Gastro-esophageal reflux disease without esophagitis: Secondary | ICD-10-CM

## 2020-05-26 DIAGNOSIS — K2100 Gastro-esophageal reflux disease with esophagitis, without bleeding: Secondary | ICD-10-CM

## 2020-05-26 DIAGNOSIS — D649 Anemia, unspecified: Secondary | ICD-10-CM

## 2020-05-26 DIAGNOSIS — R0600 Dyspnea, unspecified: Secondary | ICD-10-CM

## 2020-05-26 DIAGNOSIS — C187 Malignant neoplasm of sigmoid colon: Secondary | ICD-10-CM | POA: Diagnosis not present

## 2020-05-26 DIAGNOSIS — E43 Unspecified severe protein-calorie malnutrition: Secondary | ICD-10-CM | POA: Diagnosis not present

## 2020-05-26 DIAGNOSIS — K2970 Gastritis, unspecified, without bleeding: Secondary | ICD-10-CM

## 2020-05-26 DIAGNOSIS — R42 Dizziness and giddiness: Secondary | ICD-10-CM

## 2020-05-26 DIAGNOSIS — D599 Acquired hemolytic anemia, unspecified: Secondary | ICD-10-CM

## 2020-05-26 DIAGNOSIS — Z87891 Personal history of nicotine dependence: Secondary | ICD-10-CM

## 2020-05-26 HISTORY — PX: BALLOON DILATION: SHX5330

## 2020-05-26 HISTORY — PX: BIOPSY: SHX5522

## 2020-05-26 HISTORY — PX: ESOPHAGOGASTRODUODENOSCOPY (EGD) WITH PROPOFOL: SHX5813

## 2020-05-26 HISTORY — PX: COLONOSCOPY WITH PROPOFOL: SHX5780

## 2020-05-26 HISTORY — PX: SUBMUCOSAL TATTOO INJECTION: SHX6856

## 2020-05-26 LAB — DIRECT ANTIGLOBULIN TEST (NOT AT ARMC)
DAT, IgG: NEGATIVE
DAT, complement: NEGATIVE

## 2020-05-26 LAB — IRON AND TIBC
Iron: 247 ug/dL — ABNORMAL HIGH (ref 45–182)
Saturation Ratios: 86 % — ABNORMAL HIGH (ref 17.9–39.5)
TIBC: 286 ug/dL (ref 250–450)
UIBC: 39 ug/dL

## 2020-05-26 LAB — VITAMIN B12: Vitamin B-12: 516 pg/mL (ref 180–914)

## 2020-05-26 LAB — HEPATIC FUNCTION PANEL
ALT: 14 U/L (ref 0–44)
AST: 23 U/L (ref 15–41)
Albumin: 2 g/dL — ABNORMAL LOW (ref 3.5–5.0)
Alkaline Phosphatase: 83 U/L (ref 38–126)
Bilirubin, Direct: 0.4 mg/dL — ABNORMAL HIGH (ref 0.0–0.2)
Indirect Bilirubin: 6.6 mg/dL — ABNORMAL HIGH (ref 0.3–0.9)
Total Bilirubin: 7 mg/dL — ABNORMAL HIGH (ref 0.3–1.2)
Total Protein: 5.6 g/dL — ABNORMAL LOW (ref 6.5–8.1)

## 2020-05-26 LAB — BASIC METABOLIC PANEL
Anion gap: 6 (ref 5–15)
BUN: 6 mg/dL — ABNORMAL LOW (ref 8–23)
CO2: 20 mmol/L — ABNORMAL LOW (ref 22–32)
Calcium: 7.7 mg/dL — ABNORMAL LOW (ref 8.9–10.3)
Chloride: 104 mmol/L (ref 98–111)
Creatinine, Ser: 0.58 mg/dL — ABNORMAL LOW (ref 0.61–1.24)
GFR, Estimated: >60 mL/min (ref >60)
Glucose, Bld: 99 mg/dL (ref 70–99)
Potassium: 3.5 mmol/L (ref 3.5–5.1)
Sodium: 130 mmol/L — ABNORMAL LOW (ref 135–145)

## 2020-05-26 LAB — OSMOLALITY: Osmolality: 280 mOsm/kg (ref 275–295)

## 2020-05-26 LAB — SARS CORONAVIRUS 2 (TAT 6-24 HRS): SARS Coronavirus 2: NEGATIVE

## 2020-05-26 LAB — FERRITIN: Ferritin: 10 ng/mL — ABNORMAL LOW (ref 24–336)

## 2020-05-26 LAB — OSMOLALITY, URINE: Osmolality, Ur: 704 mOsm/kg (ref 300–900)

## 2020-05-26 LAB — RETICULOCYTES
Immature Retic Fract: 33.8 % — ABNORMAL HIGH (ref 2.3–15.9)
RBC.: 3.51 MIL/uL — ABNORMAL LOW (ref 4.22–5.81)
Retic Count, Absolute: 95.8 10*3/uL (ref 19.0–186.0)
Retic Ct Pct: 2.7 % (ref 0.4–3.1)

## 2020-05-26 LAB — SODIUM, URINE, RANDOM: Sodium, Ur: 118 mmol/L

## 2020-05-26 LAB — HEMOGLOBIN AND HEMATOCRIT, BLOOD
HCT: 28 % — ABNORMAL LOW (ref 39.0–52.0)
HCT: 27.1 % — ABNORMAL LOW (ref 39.0–52.0)
Hemoglobin: 8.7 g/dL — ABNORMAL LOW (ref 13.0–17.0)
Hemoglobin: 8.4 g/dL — ABNORMAL LOW (ref 13.0–17.0)

## 2020-05-26 LAB — LACTATE DEHYDROGENASE: LDH: 108 U/L (ref 98–192)

## 2020-05-26 LAB — PREPARE RBC (CROSSMATCH)

## 2020-05-26 LAB — FOLATE: Folate: 8.8 ng/mL (ref >5.9)

## 2020-05-26 SURGERY — ESOPHAGOGASTRODUODENOSCOPY (EGD) WITH PROPOFOL
Anesthesia: Monitor Anesthesia Care

## 2020-05-26 MED ORDER — HYDROCORTISONE 1 % EX OINT
TOPICAL_OINTMENT | CUTANEOUS | Status: DC | PRN
  Administered 2020-05-26: 1 via TOPICAL
  Filled 2020-05-26: qty 28

## 2020-05-26 MED ORDER — SUCRALFATE 1 GM/10ML PO SUSP
1.0000 g | Freq: Three times a day (TID) | ORAL | Status: DC
  Administered 2020-05-26 – 2020-06-21 (×75): 1 g via ORAL
  Filled 2020-05-26 (×79): qty 10

## 2020-05-26 MED ORDER — PANTOPRAZOLE SODIUM 40 MG PO TBEC
40.0000 mg | DELAYED_RELEASE_TABLET | Freq: Two times a day (BID) | ORAL | Status: DC
  Administered 2020-05-26 – 2020-06-01 (×13): 40 mg via ORAL
  Filled 2020-05-26 (×15): qty 1

## 2020-05-26 MED ORDER — SPOT INK MARKER SYRINGE KIT
PACK | SUBMUCOSAL | Status: DC | PRN
  Administered 2020-05-26: 4.5 mL via SUBMUCOSAL

## 2020-05-26 MED ORDER — PROPOFOL 500 MG/50ML IV EMUL
INTRAVENOUS | Status: DC | PRN
  Administered 2020-05-26: 150 ug/kg/min via INTRAVENOUS

## 2020-05-26 MED ORDER — SODIUM CHLORIDE 0.9 % IV SOLN
510.0000 mg | Freq: Once | INTRAVENOUS | Status: AC
  Administered 2020-05-27: 510 mg via INTRAVENOUS
  Filled 2020-05-26 (×2): qty 17

## 2020-05-26 MED ORDER — LACTATED RINGERS IV SOLN: INTRAVENOUS | Status: DC | PRN

## 2020-05-26 MED ORDER — PROPOFOL 10 MG/ML IV BOLUS
INTRAVENOUS | Status: DC | PRN
  Administered 2020-05-26: 20 mg via INTRAVENOUS

## 2020-05-26 MED ORDER — SODIUM CHLORIDE 0.9% IV SOLUTION: Freq: Once | INTRAVENOUS | Status: DC

## 2020-05-26 MED ORDER — SPOT INK MARKER SYRINGE KIT
PACK | SUBMUCOSAL | Status: AC
  Filled 2020-05-26: qty 5

## 2020-05-26 SURGICAL SUPPLY — 25 items

## 2020-05-26 NOTE — Op Note (Signed)
[facility] Patient Name: Marc Mcdonald Procedure Date : 05/26/2020 MRN: [id number] Attending MD: Gerrit Heck , MD Date of Birth: Apr 22, 1955 CSN: [id number] Age: 65 Admit Type: Inpatient Procedure:                Colonoscopy Indications:              Heme positive stool, Iron deficiency anemia                           No previous colonoscopy. Providers:                Gerrit Heck, MD, Erenest Rasher, RN, Laverda Sorenson, Technician, Clearnce Sorrel, CRNA Referring MD:              Medicines:                Monitored Anesthesia Care Complications:            No immediate complications. Estimated Blood Loss:     Estimated blood loss was minimal. Procedure:                Pre-Anesthesia Assessment:                           - Prior to the procedure, a History and Physical                            was performed, and patient medications and                            allergies were reviewed. The patient's tolerance of                            previous anesthesia was also reviewed. The risks                            and benefits of the procedure and the sedation                            options and risks were discussed with the patient.                            All questions were answered, and informed consent                            was obtained. Prior Anticoagulants: The patient has                            taken no previous anticoagulant or antiplatelet                            agents. ASA Grade Assessment: III - A patient with  severe systemic disease. After reviewing the risks                            and benefits, the patient was deemed in                            satisfactory condition to undergo the procedure.                           After obtaining informed consent, the colonoscope                            was passed under direct vision. Throughout the                            procedure,  the patient's blood pressure, pulse, and                            oxygen saturations were monitored continuously. The                            CF-HQ190L ([id number]) Olympus colonoscope was                            introduced through the anus and advanced to the the                            terminal ileum. The colonoscopy was performed                            without difficulty. The patient tolerated the                            procedure well. The quality of the bowel                            preparation was good. The terminal ileum, ileocecal                            valve, appendiceal orifice, and rectum were                            photographed. Scope In: 10:27:27 AM Scope Out: 10:54:27 AM Scope Withdrawal Time: 0 hours 18 minutes 54 seconds  Total Procedure Duration: 0 hours 27 minutes 0 seconds  Findings:      Hemorrhoids were found on perianal exam.      A frond-like/villous, infiltrative and ulcerated partially obstructing       mass was found in the proximal rectum. This was located 15-18 cm from       the anal verge, approximately within the 2nd Valve of [place]. The mass       was partially circumferential (involving two-thirds of the lumen       circumference). The mass measured three cm in length and traversable.       Mild contact oozing was  present. This was biopsied with a cold forceps       for histology. Area 3 cm proximal and 2 cm distal to the mass was       tattooed on the mesenteric and anti-mesenteric sides with an injection       of 4 mL of Spot (carbon black).      Non-bleeding internal hemorrhoids were found during retroflexion. The       hemorrhoids were medium-sized and Grade II (internal hemorrhoids that       prolapse but reduce spontaneously).      The exam was otherwise normal throughout the remainder of the colon.      The terminal ileum appeared normal. Impression:               - Hemorrhoids found on perianal exam.                            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 Biopsied. Tattooed.                           - Non-bleeding internal hemorrhoids.                           - The examined portion of the ileum was normal. Recommendation:           - Return patient to hospital ward for ongoing care.                           - Perform magnetic resonance imaging (MRI) Pelvis                            with gadolinium.                           - Consult General Surgery and Oncology.                           - Check CEA.                           - Awaiting pathology results.                           - CTA completed on admission and no evidence of                            intraabdominal metastasis. May need to repeat CT                            Chest for clarity on the noted inflammatory changes                            in the anterior RUL. Procedure Code(s):        --- Professional ---  45381, Colonoscopy, flexible; with directed                            submucosal injection(s), any substance                           45380, Colonoscopy, flexible; with biopsy, single                            or multiple Diagnosis Code(s):        --- Professional ---                           K64.1, Second degree hemorrhoids                           C20, Malignant neoplasm of rectum                           K56.690, Other partial intestinal obstruction                           R19.5, Other fecal abnormalities                           D50.9, Iron deficiency anemia, unspecified CPT copyright 2019 [employer]. All rights reserved. The codes documented in this report are preliminary and upon coder review may  be revised to meet current compliance requirements. Gerrit Heck, MD 05/26/2020 11:15:23 AM Number of Addenda: 0

## 2020-05-26 NOTE — Consult Note (Addendum)
[place]  [phone]   HEMATOLOGY ONCOLOGY INPATIENT CONSULTATION   Marc Mcdonald  DOB: 03/21/1955  MR#: [id number]  CSN#: [id number]    Requesting Physician: [facility]  Patient Care Team: [place], Blondell Reveal, MD as PCP - General (Family Medicine)  Reason for consult: Anemia and rectal mass  History of present illness: 65 year old Caucasian male, with past medical history of heavy drinking, heavy smoking, COPD, rib fracture in the summer of 2021, presented to ED with progressive dizziness, fatigue, and was admitted for severe anemia with hemoglobin 5.5.   Per patient and her son at the bedside, he has developed worsening fatigue, dyspnea on exertion, and intermittent dizziness in the past 2 to 3 months.  He denies any hematochezia, melena, or other bleeding.  Since his rib fracture after fall in the summer 2021, he had mild intermittent abdominal pain, which has been stable.  He denies any change of bowel habit, no constipation or diarrhea.  No nausea, vomiting, or other GI symptoms.  In the ED, he was found to have hemoglobin 5.5, stool OB was positive, LFTs showed elevated total bilirubin at the 2.1 with indirect bilirubin 1.9, albumin 2.4.  Patient has received a total of 3 units of blood so far, repeated hemoglobin 6.9 this morning, and a total bilirubin went up to 7.0, with indirect bilirubin 6.6.  Patient underwent colonoscopy this morning, which showed a 3 cm rectal mass, 15 to 18 cm from anal verge.  Staging CT chest, abdomen pelvis were negative for distant metastasis.  Pelvic MRI has been ordered for rectal cancer staging.  MEDICAL HISTORY:  Past Medical History:  Diagnosis Date  . COPD (chronic obstructive pulmonary disease) ([place])     SURGICAL HISTORY: History reviewed. No pertinent surgical history.  SOCIAL HISTORY: Social History   Socioeconomic History  . Marital status: Divorced    Spouse name: Not on file  . Number of children:  Not on file  . Years of education: Not on file  . Highest education level: Not on file  Occupational History  . Not on file  Tobacco Use  . Smoking status: Former Smoker    Types: Cigarettes    Quit date: 08/25/2019    Years since quitting: 0.7  . Smokeless tobacco: Never Used  Vaping Use  . Vaping Use: Never used  Substance and Sexual Activity  . Alcohol use: Yes    Comment: 12-15 day  . Drug use: Never  . Sexual activity: Not Currently  Other Topics Concern  . Not on file  Social History Narrative  . Not on file   Social Determinants of Health   Financial Resource Strain: Not on file  Food Insecurity: Not on file  Transportation Needs: Not on file  Physical Activity: Not on file  Stress: Not on file  Social Connections: Not on file  Intimate Partner Violence: Not on file    FAMILY HISTORY: Family History  Family history unknown: Yes    ALLERGIES:  has No Known Allergies.  MEDICATIONS:  Current Facility-Administered Medications  Medication Dose Route Frequency Provider Last Rate Last Admin  . 0.9 %  sodium chloride infusion (Manually program via Guardrails IV Fluids)   Intravenous Once Cirigliano, Vito V, DO      . 0.9 %  sodium chloride infusion  10 mL/hr Intravenous Once Cirigliano, Vito V, DO   Held at 05/25/20 1308  . acetaminophen (TYLENOL) tablet 650 mg  650 mg Oral Q6H PRN Cirigliano, Vito  V, DO       Or  . acetaminophen (TYLENOL) suppository 650 mg  650 mg Rectal Q6H PRN Cirigliano, Vito V, DO      . ipratropium-albuterol (DUONEB) 0.5-2.5 (3) MG/3ML nebulizer solution 3 mL  3 mL Nebulization Q4H PRN Cirigliano, Vito V, DO      . LORazepam (ATIVAN) injection 0-4 mg  0-4 mg Intravenous Q6H Cirigliano, Vito V, DO       Or  . LORazepam (ATIVAN) tablet 0-4 mg  0-4 mg Oral Q6H Cirigliano, Vito V, DO      . [START ON 05/27/2020] LORazepam (ATIVAN) injection 0-4 mg  0-4 mg Intravenous Q12H Cirigliano, Vito V, DO       Or  . [START ON 05/27/2020] LORazepam (ATIVAN)  tablet 0-4 mg  0-4 mg Oral Q12H Cirigliano, Vito V, DO      . pantoprazole (PROTONIX) EC tablet 40 mg  40 mg Oral BID Cirigliano, Vito V, DO      . senna (SENOKOT) tablet 8.6 mg  1 tablet Oral BID Cirigliano, Vito V, DO      . sucralfate (CARAFATE) 1 GM/10ML suspension 1 g  1 g Oral TID WC & HS Cirigliano, Vito V, DO      . thiamine tablet 100 mg  100 mg Oral Daily Cirigliano, Vito V, DO   100 mg at 05/25/20 1308   Or  . thiamine (B-1) injection 100 mg  100 mg Intravenous Daily Cirigliano, Vito V, DO        REVIEW OF SYSTEMS:   Constitutional: Denies fevers, chills or abnormal night sweats, (+) profound fatigue, and a 10 pound weight loss in the past 2 to 3 months. Eyes: Denies blurriness of vision, double vision or watery eyes Ears, nose, mouth, throat, and face: Denies mucositis or sore throat Respiratory: Denies cough,  (+) dyspnea on exertion Cardiovascular: Denies palpitation, chest discomfort or lower extremity swelling Gastrointestinal:  Denies nausea, heartburn or change in bowel habits Skin: Denies abnormal skin rashes Lymphatics: Denies new lymphadenopathy or easy bruising Neurological:Denies numbness, tingling, (+) intermittent dizziness Behavioral/Psych: Mood is stable, no new changes  All other systems were reviewed with the patient and are negative.  PHYSICAL EXAMINATION: ECOG PERFORMANCE STATUS: 2 - Symptomatic, <50% confined to bed  Vitals:   05/26/20 1216 05/26/20 1246  BP: 122/86 128/81  Pulse: 90 94  Resp: (!) 25   Temp:    SpO2: 100% 100%   Filed Weights   05/25/20 2306 05/26/20 0447 05/26/20 0945  Weight: 126 lb 15.8 oz (57.6 kg) 125 lb 3.5 oz (56.8 kg) 125 lb 3.5 oz (56.8 kg)    GENERAL:alert, no distress and comfortable SKIN: skin color, texture, turgor are normal, no rashes or significant lesions EYES: normal, conjunctiva are pink and non-injected, sclera clear OROPHARYNX:no exudate, no erythema and lips, buccal mucosa, and tongue normal  NECK:  supple, thyroid normal size, non-tender, without nodularity LYMPH:  no palpable lymphadenopathy in the cervical, axillary or inguinal LUNGS: clear to auscultation and percussion with normal breathing effort HEART: regular rate & rhythm and no murmurs and no lower extremity edema ABDOMEN:abdomen soft, non-tender and normal bowel sounds Musculoskeletal:no cyanosis of digits and no clubbing  PSYCH: alert & oriented x 3 with fluent speech NEURO: no focal motor/sensory deficits  LABORATORY DATA:  I have reviewed the data as listed Lab Results  Component Value Date   WBC 5.7 05/26/2020   HGB 8.7 (L) 05/26/2020   HCT 28.0 (L) 05/26/2020   MCV  77.3 (L) 05/26/2020   PLT 309 05/26/2020   Recent Labs    09/08/19 2017 09/09/19 0207 05/25/20 1104 05/25/20 1305 05/26/20 0653  NA 137 134* 125* 127* 130*  K 3.7 4.1 3.8 3.9 3.5  CL 101 100 95*  --  104  CO2 22 18* 16*  --  20*  GLUCOSE 108* 117* 135*  --  99  BUN <5* <5* 5*  --  6*  CREATININE 0.70 0.63 0.70  --  0.58*  CALCIUM 8.4* 8.3* 8.1*  --  7.7*  GFRNONAA >60 >60 >60  --  >60  GFRAA >60 >60  --   --   --   PROT  --  6.8 6.7  --  5.6*  ALBUMIN  --  3.0* 2.4*  --  2.0*  AST  --  50* 17  --  23  ALT  --  26 13  --  14  ALKPHOS  --  110 98  --  83  BILITOT  --  1.1 2.1*  --  7.0*  BILIDIR  --   --  0.2  --  0.4*  IBILI  --   --  1.9*  --  6.6*    RADIOGRAPHIC STUDIES: I have personally reviewed the radiological images as listed and agreed with the findings in the report. CT Head Wo Contrast  Result Date: 05/25/2020 CLINICAL DATA:  Head trauma, minor. Neck trauma. Dizziness and lightheadedness for 1 week, spine pain radiating to neck. EXAM: CT HEAD WITHOUT CONTRAST CT CERVICAL SPINE WITHOUT CONTRAST TECHNIQUE: Multidetector CT imaging of the head and cervical spine was performed following the standard protocol without intravenous contrast. Multiplanar CT image reconstructions of the cervical spine were also generated. COMPARISON:   CT head/maxillofacial 12/14/2010. FINDINGS: CT HEAD FINDINGS Brain: Mild-to-moderate cerebral atrophy. Minimal patchy and ill-defined hypoattenuation within the cerebral white matter is nonspecific, but compatible with chronic small vessel ischemic disease. There is no acute intracranial hemorrhage. No demarcated cortical infarct. No extra-axial fluid collection. No evidence of intracranial mass. No midline shift. Vascular: No hyperdense vessel.  Atherosclerotic calcifications Skull: Normal. Negative for fracture or focal lesion. Sinuses/Orbits: Visualized orbits show no acute finding. Mucous retention cysts within the posterior right ethmoid air cell and within the right sphenoid sinus. There is otherwise only trace scattered paranasal sinus mucosal thickening at the imaged levels. CT CERVICAL SPINE FINDINGS Mildly motion degraded examination. Alignment: Reversal of the expected cervical lordosis. 1-2 mm C3-C4 and C4-C5 grade 1 anterolisthesis. 1-2 mm C5-C6 grade 1 retrolisthesis. Skull base and vertebrae: The basion-dental and atlanto-dental intervals are maintained.No evidence of acute fracture to the cervical spine. Soft tissues and spinal canal: No prevertebral fluid or swelling. No visible canal hematoma. Disc levels: Cervical spondylosis with shallow multilevel disc bulges/central disc protrusions, uncovertebral hypertrophy and facet arthrosis. Facet joint ankylosis on the left at C4-C5. Disc space narrowing is greatest at C5-C6 and C6-C7 (advanced at these levels). No appreciable high-grade spinal canal stenosis. No high-grade bony neural foraminal narrowing. Upper chest: No consolidation within the imaged lung apices. No visible pneumothorax. Right apical bullae. IMPRESSION: CT head: 1. No evidence of acute intracranial abnormality. 2. Mild-to-moderate cerebral atrophy. 3. Mild cerebral white matter chronic small vessel ischemic disease. 4. Mild paranasal sinus disease as described. CT cervical spine: 1.  Mildly motion degraded exam. 2. No evidence of acute fracture to the cervical spine. 3. Nonspecific reversal of the expected cervical lordosis. 4. Mild grade 1 spondylolisthesis at C3-C4, C4-C5 and C5-C6 as detailed.  5. Cervical spondylosis as described. Electronically Signed   By: Kellie Simmering DO   On: 05/25/2020 15:33   CT Cervical Spine Wo Contrast  Result Date: 05/25/2020 CLINICAL DATA:  Head trauma, minor. Neck trauma. Dizziness and lightheadedness for 1 week, spine pain radiating to neck. EXAM: CT HEAD WITHOUT CONTRAST CT CERVICAL SPINE WITHOUT CONTRAST TECHNIQUE: Multidetector CT imaging of the head and cervical spine was performed following the standard protocol without intravenous contrast. Multiplanar CT image reconstructions of the cervical spine were also generated. COMPARISON:  CT head/maxillofacial 12/14/2010. FINDINGS: CT HEAD FINDINGS Brain: Mild-to-moderate cerebral atrophy. Minimal patchy and ill-defined hypoattenuation within the cerebral white matter is nonspecific, but compatible with chronic small vessel ischemic disease. There is no acute intracranial hemorrhage. No demarcated cortical infarct. No extra-axial fluid collection. No evidence of intracranial mass. No midline shift. Vascular: No hyperdense vessel.  Atherosclerotic calcifications Skull: Normal. Negative for fracture or focal lesion. Sinuses/Orbits: Visualized orbits show no acute finding. Mucous retention cysts within the posterior right ethmoid air cell and within the right sphenoid sinus. There is otherwise only trace scattered paranasal sinus mucosal thickening at the imaged levels. CT CERVICAL SPINE FINDINGS Mildly motion degraded examination. Alignment: Reversal of the expected cervical lordosis. 1-2 mm C3-C4 and C4-C5 grade 1 anterolisthesis. 1-2 mm C5-C6 grade 1 retrolisthesis. Skull base and vertebrae: The basion-dental and atlanto-dental intervals are maintained.No evidence of acute fracture to the cervical spine. Soft  tissues and spinal canal: No prevertebral fluid or swelling. No visible canal hematoma. Disc levels: Cervical spondylosis with shallow multilevel disc bulges/central disc protrusions, uncovertebral hypertrophy and facet arthrosis. Facet joint ankylosis on the left at C4-C5. Disc space narrowing is greatest at C5-C6 and C6-C7 (advanced at these levels). No appreciable high-grade spinal canal stenosis. No high-grade bony neural foraminal narrowing. Upper chest: No consolidation within the imaged lung apices. No visible pneumothorax. Right apical bullae. IMPRESSION: CT head: 1. No evidence of acute intracranial abnormality. 2. Mild-to-moderate cerebral atrophy. 3. Mild cerebral white matter chronic small vessel ischemic disease. 4. Mild paranasal sinus disease as described. CT cervical spine: 1. Mildly motion degraded exam. 2. No evidence of acute fracture to the cervical spine. 3. Nonspecific reversal of the expected cervical lordosis. 4. Mild grade 1 spondylolisthesis at C3-C4, C4-C5 and C5-C6 as detailed. 5. Cervical spondylosis as described. Electronically Signed   By: Kellie Simmering DO   On: 05/25/2020 15:33   DG Chest Port 1 View  Result Date: 05/25/2020 CLINICAL DATA:  Chest pain EXAM: PORTABLE CHEST 1 VIEW COMPARISON:  10/12/2019 FINDINGS: The heart size and mediastinal contours are within normal limits. Emphysema. Both lungs are clear. No pleural effusion or pneumothorax. Chronic bilateral rib fractures. IMPRESSION: No acute process in the chest. Electronically Signed   By: Macy Mis M.D.   On: 05/25/2020 11:49   CT Angio Chest/Abd/Pel for Dissection W and/or Wo Contrast  Result Date: 05/25/2020 CLINICAL DATA:  65 year old with chest pain. Evaluate for an aortic dissection. EXAM: CT ANGIOGRAPHY CHEST, ABDOMEN AND PELVIS TECHNIQUE: Non-contrast CT of the chest was initially obtained. Multidetector CT imaging through the chest, abdomen and pelvis was performed using the standard protocol during bolus  administration of intravenous contrast. Multiplanar reconstructed images and MIPs were obtained and reviewed to evaluate the vascular anatomy. CONTRAST:  142mL OMNIPAQUE IOHEXOL 350 MG/ML SOLN COMPARISON:  CT 09/09/2019 FINDINGS: CTA CHEST FINDINGS Cardiovascular: Aortic root at the sinuses of Valsalva measures 3.7 cm. Ascending thoracic aorta measures 3.2 cm without dissection. Typical three-vessel arch anatomy. [employer]  vessels are patent. Proximal descending thoracic aorta measures 2.8 cm. Distal descending thoracic aorta measures 2.5 cm.Main pulmonary arteries are patent without large filling defects. Heart size is normal. No significant pericardial effusion. Mediastinum/Nodes: Small hiatal hernia. No significant chest lymphadenopathy. Lungs/Pleura: Scarring at the lung apices. Small amount of debris or mucus in the trachea. No large pleural effusions. Stable punctate nodule in the superior segment of the left lower lobe on sequence 8 image 47. No significant airspace disease or consolidation in the lungs. Negative for pneumothorax. Few patchy densities in the right upper lobe near the right minor fissure are suggestive for atelectasis or post inflammatory changes based on the configuration, sequence 6, image 37. Musculoskeletal: Old bilateral rib fractures. No acute bone abnormality. Review of the MIP images confirms the above findings. CTA ABDOMEN AND PELVIS FINDINGS VASCULAR Aorta: Atherosclerotic disease in the abdominal aorta without aneurysm, dissection or significant stenosis. Celiac: Celiac trunk is widely patent. Splenic artery and left gastric artery are patent. Gastroduodenal artery originates directly from the celiac trunk. Proper hepatic artery comes off the SMA. SMA: SMA is widely patent. Renals: Both renal arteries are patent without evidence of aneurysm, dissection, vasculitis, fibromuscular dysplasia or significant stenosis. IMA: Patent Inflow: Atherosclerotic plaque in the iliac arteries without  significant stenosis. Common, internal and external iliac arteries are patent bilaterally. Close to 50% narrowing in the proximal left common femoral artery. Proximal femoral arteries are patent bilaterally. Veins: No obvious venous abnormality within the limitations of this arterial phase study. Review of the MIP images confirms the above findings. NON-VASCULAR Hepatobiliary: No appearance of the gallbladder. No acute abnormality to the liver. No significant biliary dilatation. Pancreas: Unremarkable. No pancreatic ductal dilatation or surrounding inflammatory changes. Spleen: Normal in size without focal abnormality. Adrenals/Urinary Tract: Normal appearance of the adrenal glands. Normal appearance of both kidneys without hydronephrosis. Normal appearance of the urinary bladder. Stomach/Bowel: Stomach is within normal limits. Appendix appears normal. No evidence of bowel wall thickening, distention, or inflammatory changes. Small hiatal hernia. Lymphatic: No lymph node enlargement in the abdomen or pelvis. Reproductive: Stable appearance of the prostate and seminal vesicles. Other: Negative for ascites.  Negative for free air. Musculoskeletal: No acute bone abnormality. Review of the MIP images confirms the above findings. IMPRESSION: 1. No acute abnormality in the chest, abdomen or pelvis. Specifically, negative for an acute aortic injury or dissection. 2. Main visceral arteries are patent. No evidence for mesenteric ischemia. No acute bowel abnormality. 3. Old bilateral rib fractures. 4. New small densities in the anterior right upper lobe near the right minor fissure. Suspect these are postinflammatory or atelectasis. 5.  Aortic Atherosclerosis (ICD10-I70.0). Electronically Signed   By: Markus Daft M.D.   On: 05/25/2020 15:29    ASSESSMENT & PLAN: 65 year old gentleman with past medical history of COPD, heavy smoking and alcohol drinking, presented with symptomatic anemia  1. Rectal tumor, likely rectal  cancer, biopsy result pending  2.  Severe symptomatic anemia, from iron deficiency and probable hemolytic anemia 3. COPD  4. Hyperbilirubinemia, secondary to hemolysis  5.  Duodenal ulcer 6.  Alcohol abuse, history of heavy smoking.   Recommendations: -Patient has significant hyperbilirubinemia, with predominant indirect bilirubin, supports hemolytic anemia.  I have ordered reticulocyte count, LDH, haptoglobin, Coombs test.  If he does have autoimmune related hemolytic anemia, I will recommend prednisone 60 mg daily. -His microcytic anemia is also possibly related to the slow rectal bleeding from the tumor and or duodenal ulcer, I have ordered iron study, to see  if he needs IV iron, which will be more efficient than oral iron to improve his anemia.  Given his heavy alcohol drinking, I will also check a K44 and folic acid level.  -He is proximal rectal tumor appears to be malignant on the colonoscopy, biopsy results still pending.  CT scan was negative for distant metastasis.  His pelvic MRI for staging is pending.  I briefly discussed treatment options for early stage rectal cancer, including surgery, neoadjuvant chemo and radiation.  I will finalize and discussed with patient and his son when I have the biopsy and MRI results. -will f/u    All questions were answered. The patient knows to call the clinic with any problems, questions or concerns.      Truitt Merle, MD 05/26/2020 3:02 PM  Addendum  I reviewed his anemia lab results.  His absolute reticulocyte count is not significantly elevated, LDH is normal, Coombs test was negative, haptoglobin still pending.  The findings does not support hemolysis, no indication for steroid.  His markedly elevated indirect bilirubin could be related to impaired hepatic uptake or conjugation of bilirubin, I am curious about what GI team's thoughts on this.   His ferritin is very low, supports iron deficient anemia.  I will order 1 dose IV Feraheme for tomorrow  morning.  His W10 and folic acid level were normal.  Truitt Merle  05/26/2020

## 2020-05-26 NOTE — Transfer of Care (Signed)
Immediate Anesthesia Transfer of Care Note  Patient: Marc Mcdonald  Procedure(s) Performed: ESOPHAGOGASTRODUODENOSCOPY (EGD) WITH PROPOFOL (N/A ) COLONOSCOPY WITH PROPOFOL (N/A ) BIOPSY BALLOON DILATION (N/A )  Patient Location: PACU  Anesthesia Type:MAC  Level of Consciousness: drowsy  Airway & Oxygen Therapy: Patient Spontanous Breathing  Post-op Assessment: Report given to RN and Post -op Vital signs reviewed and stable  Post vital signs: Reviewed and stable  Last Vitals:  Vitals Value Taken Time  BP    Temp    Pulse    Resp    SpO2      Last Pain:  Vitals:   05/26/20 0945  TempSrc: Oral  PainSc: 0-No pain         Complications: No complications documented.

## 2020-05-26 NOTE — Progress Notes (Signed)
Initial Nutrition Assessment  DOCUMENTATION CODES:   Underweight  INTERVENTION:   Recommend Ensure Enlive po TID, each supplement provides 350 kcal and 20 grams of protein, once diet advanced  MVI with Minerals daily  NUTRITION DIAGNOSIS:   Inadequate oral intake related to poor appetite,social / environmental circumstances,altered GI function as evidenced by NPO status.  GOAL:   Patient will meet greater than or equal to 90% of their needs  MONITOR:   Diet advancement,PO intake,Labs,Weight trends  REASON FOR ASSESSMENT:   Malnutrition Screening Tool    ASSESSMENT:   65 yo male admitted with symptomatic anemia, dyspnea on exertion, hyponatremia. PMH includes COPD, EtOH abuse (12-15 beers per day) and tobacco abuse (1 pack every other day)  4/02 EGD   NPO currently. Pt out of room for procedure upon assessment. CIWA protocol in place  Daughter in [place] states pt has lost 10 pounds since July. Pt with abdominal pain and poor appetite; pt does not want to eat after drinking beer. Usually drinks 12-15 beers per day and is shaky in the AM if he does not drink EtOH.   Current wt 56.8 kg; noted weight around 60 kg in July per wt encounters. 5%wt loss. BMI 17.5  Pt is underweight; pt hx of severe malnutrition in context of chronic illness. RD suspects that pt likely still meets criteria for Severe Malnutrition   Labs: sodium 130 (L) Meds: reviewed   Diet Order:   Diet Order            Diet NPO time specified  Diet effective 0500 tomorrow                 EDUCATION NEEDS:   Not appropriate for education at this time  Skin:  Skin Assessment: Reviewed RN Assessment  Last BM:  PTA  Height:   Ht Readings from Last 1 Encounters:  05/26/20 5\' 11"  (1.803 m)    Weight:   Wt Readings from Last 1 Encounters:  05/26/20 56.8 kg    BMI:  Body mass index is 17.46 kg/m.  Estimated Nutritional Needs:   Kcal:  2000-2200 kcals  Protein:  100-115 g  Fluid:  >/=  2 L   Kerman Passey MS, RDN, LDN, CNSC Registered Dietitian III Clinical Nutrition RD Pager and On-Call Pager Number Located in [place]

## 2020-05-26 NOTE — Op Note (Signed)
[facility] Patient Name: Marc Mcdonald Procedure Date : 05/26/2020 MRN: [id number] Attending MD: Gerrit Heck , MD Date of Birth: [birth year] CSN: [id number] Age: 65 Admit Type: Inpatient Procedure:                Upper GI endoscopy Indications:              Epigastric abdominal pain, Abdominal pain in the                            left upper quadrant, Iron deficiency anemia,                            Dysphagia, Heme positive stool Providers:                Gerrit Heck, MD, Erenest Rasher, RN, Laverda Sorenson, Technician, Clearnce Sorrel, CRNA Referring MD:              Medicines:                Monitored Anesthesia Care Complications:            No immediate complications. Estimated Blood Loss:     Estimated blood loss was minimal. Procedure:                Pre-Anesthesia Assessment:                           - Prior to the procedure, a History and Physical                            was performed, and patient medications and                            allergies were reviewed. The patient's tolerance of                            previous anesthesia was also reviewed. The risks                            and benefits of the procedure and the sedation                            options and risks were discussed with the patient.                            All questions were answered, and informed consent                            was obtained. Prior Anticoagulants: The patient has                            taken no previous anticoagulant or antiplatelet  agents. ASA Grade Assessment: III - A patient with                            severe systemic disease. After reviewing the risks                            and benefits, the patient was deemed in                            satisfactory condition to undergo the procedure.                           After obtaining informed consent, the endoscope was                             passed under direct vision. Throughout the                            procedure, the patient's blood pressure, pulse, and                            oxygen saturations were monitored continuously. The                            GIF-H190 ([id number] Olympus gastroscope was                            introduced through the mouth, and advanced to the                            second part of duodenum. The upper GI endoscopy was                            accomplished without difficulty. The patient                            tolerated the procedure well. Scope In: Scope Out: Findings:      One benign-appearing, intrinsic moderate stenosis was found in the lower       third of the esophagus. The stenosis was traversed. A TTS dilator was       passed through the scope. Dilation with a 15-16.5-18 mm balloon dilator       was performed to 16.5 mm. The dilation site was examined and showed mild       mucosal disruption and moderate improvement in luminal narrowing,       consistent with successul stricture dilation. Estimated blood loss was       minimal.      LA Grade B (one or more mucosal breaks greater than 5 mm, not extending       between the tops of two mucosal folds) esophagitis with no bleeding was       found in the lower third of the esophagus.      A 5 cm hiatal hernia was present.      Localized mild inflammation characterized by erythema was found at the  incisura and in the gastric antrum. Biopsies were taken with a cold       forceps for histology. Estimated blood loss was minimal.      Moderately erythematous mucosa without active bleeding was found in the       duodenal bulb. Biopsies were taken with a cold forceps for histology.       Estimated blood loss was minimal.      One non-bleeding cratered duodenal ulcer with no stigmata of bleeding       was found in the first portion of the duodenum. The lesion was 15 mm in       largest dimension.      The second  portion of the duodenum was normal. Impression:               - Benign-appearing esophageal stenosis. Dilated.                           - LA Grade B reflux esophagitis with no bleeding.                           - 5 cm hiatal hernia.                           - Gastritis. Biopsied.                           - Erythematous duodenopathy. Biopsied.                           - Non-bleeding duodenal ulcer with no stigmata of                            bleeding.                           - Normal second portion of the duodenum. Recommendation:           - Perform a colonoscopy today.                           - Use Protonix (pantoprazole) 40 mg PO BID for 10                            weeks, then reduce to 40 mg daily.                           - Await pathology results.                           - Full liquid diet today, then advance tomorrow as                            tolerated.                           - Use sucralfate suspension 1 gram PO QID for 4  weeks.                           - May consider repeat EGD in 6-8 weeks as an                            outpatient to evaluate for appropriate mucosal                            healing. Procedure Code(s):        --- Professional ---                           [phone], Esophagogastroduodenoscopy, flexible,                            transoral; with transendoscopic balloon dilation of                            esophagus (less than 30 mm diameter)                           43239, 59, Esophagogastroduodenoscopy, flexible,                            transoral; with biopsy, single or multiple Diagnosis Code(s):        --- Professional ---                           K22.2, Esophageal obstruction                           K21.00, Gastro-esophageal reflux disease with                            esophagitis, without bleeding                           K44.9, Diaphragmatic hernia without obstruction or                             gangrene                           K29.70, Gastritis, unspecified, without bleeding                           K31.89, Other diseases of stomach and duodenum                           K26.9, Duodenal ulcer, unspecified as acute or                            chronic, without hemorrhage or perforation                           R10.13, Epigastric pain  R10.12, Left upper quadrant pain                           D50.9, Iron deficiency anemia, unspecified                           R13.10, Dysphagia, unspecified                           R19.5, Other fecal abnormalities CPT copyright 2019 [employer]. All rights reserved. The codes documented in this report are preliminary and upon coder review may  be revised to meet current compliance requirements. Gerrit Heck, MD 05/26/2020 11:05:42 AM Number of Addenda: 0

## 2020-05-26 NOTE — Anesthesia Preprocedure Evaluation (Addendum)
Anesthesia Evaluation  Patient identified by MRN, date of birth, ID band Patient awake    Reviewed: Allergy & Precautions, NPO status , Patient's Chart, lab work & pertinent test results  History of Anesthesia Complications Negative for: history of anesthetic complications  Airway Mallampati: II  TM Distance: >3 FB Neck ROM: Full    Dental  (+) Edentulous Upper, Edentulous Lower   Pulmonary shortness of breath, COPD, Current Smoker and Patient abstained from smoking., former smoker,  05/25/2020 SARS coronavirus NEG   breath sounds clear to auscultation       Cardiovascular negative cardio ROS   Rhythm:Regular Rate:Normal     Neuro/Psych Shaky when skips alcohol    GI/Hepatic (+)     substance abuse (12-15 beers a day)  alcohol use, GI bleed   Endo/Other  negative endocrine ROS  Renal/GU negative Renal ROS     Musculoskeletal   Abdominal   Peds  Hematology  (+) Blood dyscrasia (Hb 6.9), anemia ,   Anesthesia Other Findings   Reproductive/Obstetrics                            Anesthesia Physical Anesthesia Plan  ASA: III  Anesthesia Plan: MAC   Post-op Pain Management:    Induction:   PONV Risk Score and Plan: 0  Airway Management Planned: Natural Airway and Nasal Cannula  Additional Equipment: None  Intra-op Plan:   Post-operative Plan:   Informed Consent: I have reviewed the patients History and Physical, chart, labs and discussed the procedure including the risks, benefits and alternatives for the proposed anesthesia with the patient or authorized representative who has indicated his/her understanding and acceptance.   Patient has DNR.  Suspend DNR and Discussed DNR with patient.     Plan Discussed with: CRNA and Surgeon  Anesthesia Plan Comments:        Anesthesia Quick Evaluation

## 2020-05-26 NOTE — Progress Notes (Signed)
I stop by the patient's room after he had recovered from sedation to discuss the results of the EGD and colonoscopy with him at length, along with his son at bedside and additional family members by phone per his request.  Discussed the finding of colorectal cancer at length, to include next steps in the diagnostic work-up.  Plan for MRI pelvis, CEA, Oncology and Surgical consultation.  Additionally discussed findings from EGD, to include large duodenal ulcer and mild gastritis along with GERD with erosive esophagitis and esophageal stricture.  Plan for high-dose PPI and Carafate.  All questions answered.

## 2020-05-26 NOTE — Consult Note (Signed)
Reason for Consult: new diagnosis of rectal cancer Referring Physician: Rachel Moulds. Marc Peetz, DO  HPI: Marc Mcdonald is an 65 y.o. male with past medical history of questionable COPD, past left 5,6,8 ribs fracture who presented to the ED on 4/1 for dizziness, dyspnea with exertion and found to have hemoglobin of 5.5. These symptoms had been worsening over the last 5 weeks. He also reports a 10-15lbs weight loss. Further workup included an upper and lower endoscopy. On upper endoscopy a benign esophageal stenosis was dilated. Furthermore he was found to have a small hiatal hernia and reflux gastritis but no source of bleeding was identified. On colonoscopy he was found to have a partially obstructing proximal rectal mass. This was biopsied and tattooed. The colonoscopy was complete.  CT chest/abdomen/pelvis was performed and did not show any evidence of metastatic disease to the liver or lung. MRI of the pelvis is pending. CEA is also pending.  General Surgery was consulted for treatment recommendations.   Past Medical History:  Diagnosis Date  . COPD (chronic obstructive pulmonary disease) ([place])     History reviewed. No pertinent surgical history.  Family History  Family history unknown: Yes    Social History:  reports that he quit smoking about 9 months ago. His smoking use included cigarettes. He has never used smokeless tobacco. He reports current alcohol use. He reports that he does not use drugs.  Allergies: No Known Allergies  Medications: I have reviewed the patient's current medications.  Results for orders placed or performed during the hospital encounter of 05/25/20 (from the past 48 hour(s))  Basic metabolic panel     Status: Abnormal   Collection Time: 05/25/20 11:04 AM  Result Value Ref Range   Sodium 125 (L) 135 - 145 mmol/L   Potassium 3.8 3.5 - 5.1 mmol/L   Chloride 95 (L) 98 - 111 mmol/L   CO2 16 (L) 22 - 32 mmol/L   Glucose, Bld 135 (H) 70 - 99 mg/dL    Comment:  Glucose reference range applies only to samples taken after fasting for at least 8 hours.   BUN 5 (L) 8 - 23 mg/dL   Creatinine, Ser 0.70 0.61 - 1.24 mg/dL   Calcium 8.1 (L) 8.9 - 10.3 mg/dL   GFR, Estimated >60 >60 mL/min    Comment: (NOTE) Calculated using the CKD-EPI Creatinine Equation (2021)    Anion gap 14 5 - 15    Comment: Performed at [facility] [address] [place], [place] [address]  Hepatic function panel     Status: Abnormal   Collection Time: 05/25/20 11:04 AM  Result Value Ref Range   Total Protein 6.7 6.5 - 8.1 g/dL   Albumin 2.4 (L) 3.5 - 5.0 g/dL   AST 17 15 - 41 U/L   ALT 13 0 - 44 U/L   Alkaline Phosphatase 98 38 - 126 U/L   Total Bilirubin 2.1 (H) 0.3 - 1.2 mg/dL   Bilirubin, Direct 0.2 0.0 - 0.2 mg/dL   Indirect Bilirubin 1.9 (H) 0.3 - 0.9 mg/dL    Comment: Performed at [facility] [address], [place], [place] [address]  Troponin I (High Sensitivity)     Status: None   Collection Time: 05/25/20 11:04 AM  Result Value Ref Range   Troponin I (High Sensitivity) 2 <18 ng/L    Comment: (NOTE) Elevated high sensitivity troponin I (hsTnI) values and significant  changes across serial measurements may suggest ACS but many other  chronic and acute conditions are known to elevate hsTnI results.  Refer to the "Links" section for chest pain algorithms and additional  guidance. Performed at [facility], [place] [address], [place], [place] [address]   CBC with Differential     Status: Abnormal   Collection Time: 05/25/20 11:04 AM  Result Value Ref Range   WBC 11.7 (H) 4.0 - 10.5 K/uL   RBC 2.76 (L) 4.22 - 5.81 MIL/uL   Hemoglobin 5.5 (LL) 13.0 - 17.0 g/dL    Comment: REPEATED TO VERIFY Reticulocyte Hemoglobin testing may be clinically indicated, consider ordering this additional test [id number] THIS CRITICAL RESULT HAS VERIFIED AND BEEN CALLED TO E.DIXON,RN BY BONNIE DAVIS ON 04 01 2022 AT 1208, AND HAS BEEN READ BACK.     HCT 21.3 (L)  39.0 - 52.0 %   MCV 77.2 (L) 80.0 - 100.0 fL   MCH 19.9 (L) 26.0 - 34.0 pg   MCHC 25.8 (L) 30.0 - 36.0 g/dL   RDW 22.4 (H) 11.5 - 15.5 %   Platelets 424 (H) 150 - 400 K/uL   nRBC 0.3 (H) 0.0 - 0.2 %   Neutrophils Relative % 75 %   Neutro Abs 8.7 (H) 1.7 - 7.7 K/uL   Lymphocytes Relative 11 %   Lymphs Abs 1.2 0.7 - 4.0 K/uL   Monocytes Relative 13 %   Monocytes Absolute 1.6 (H) 0.1 - 1.0 K/uL   Eosinophils Relative 0 %   Eosinophils Absolute 0.1 0.0 - 0.5 K/uL   Basophils Relative 0 %   Basophils Absolute 0.0 0.0 - 0.1 K/uL   Immature Granulocytes 1 %   Abs Immature Granulocytes 0.13 (H) 0.00 - 0.07 K/uL    Comment: Performed at [facility], [address], [place], [place] [address]  Ethanol     Status: None   Collection Time: 05/25/20 11:04 AM  Result Value Ref Range   Alcohol, Ethyl (B) <10 <10 mg/dL    Comment: (NOTE) Lowest detectable limit for serum alcohol is 10 mg/dL.  For medical purposes only. Performed at [facility], [place] [address] [place], [place] [address]   Lactic acid, plasma     Status: Abnormal   Collection Time: 05/25/20 12:46 PM  Result Value Ref Range   Lactic Acid, Venous 5.3 (HH) 0.5 - 1.9 mmol/L    Comment: CRITICAL RESULT CALLED TO, READ BACK BY AND VERIFIED WITH: E DIXON RN [id number] [phone] BY A BENNETT Performed at [facility], [place] [address], [place], [place] [address]   Troponin I (High Sensitivity)     Status: None   Collection Time: 05/25/20 12:46 PM  Result Value Ref Range   Troponin I (High Sensitivity) <2 <18 ng/L    Comment: (NOTE) Elevated high sensitivity troponin I (hsTnI) values and significant  changes across serial measurements may suggest ACS but many other  chronic and acute conditions are known to elevate hsTnI results.  Refer to the "Links" section for chest pain algorithms and additional  guidance. Performed at [facility], [place] [address], [place], [place] [address]   Prepare RBC (crossmatch)      Status: None   Collection Time: 05/25/20 12:46 PM  Result Value Ref Range   Order Confirmation      ORDER PROCESSED BY BLOOD BANK Performed at [facility], [place] [address] [place], [place] [address]   Type and screen     Status: None (Preliminary result)  Collection Time: 05/25/20 12:50 PM  Result Value Ref Range   ABO/RH(D) B POS    Antibody Screen NEG    Sample Expiration 05/28/2020,2359    Unit Number [id number]    Blood Component Type RED CELLS,LR    Unit division 00    Status of Unit ISSUED    Transfusion Status OK TO TRANSFUSE    Crossmatch Result Compatible    Unit Number [id number]    Blood Component Type RED CELLS,LR    Unit division 00    Status of Unit ISSUED    Transfusion Status OK TO TRANSFUSE    Crossmatch Result Compatible    Unit Number [id number]    Blood Component Type RED CELLS,LR    Unit division 00    Status of Unit ISSUED    Transfusion Status OK TO TRANSFUSE    Crossmatch Result      Compatible Performed at [facility], [place] [address] [place], [place] [address]   ABO/Rh     Status: None   Collection Time: 05/25/20 12:53 PM  Result Value Ref Range   ABO/RH(D)      B POS Performed at [facility] [address] [place], [place] [address]   POC occult blood, ED     Status: Abnormal   Collection Time: 05/25/20  1:04 PM  Result Value Ref Range   Fecal Occult Bld POSITIVE (A) NEGATIVE  I-Stat venous blood gas, ED     Status: Abnormal   Collection Time: 05/25/20  1:05 PM  Result Value Ref Range   pH, Ven 7.391 7.250 - 7.430   pCO2, Ven 31.9 (L) 44.0 - 60.0 mmHg   pO2, Ven 86.0 (H) 32.0 - 45.0 mmHg   Bicarbonate 19.4 (L) 20.0 - 28.0 mmol/L   TCO2 20 (L) 22 - 32 mmol/L   O2 Saturation 97.0 %   Acid-base deficit 5.0 (H) 0.0 - 2.0 mmol/L   Sodium 127 (L) 135 - 145 mmol/L   Potassium 3.9 3.5 - 5.1 mmol/L   Calcium, Ion 1.07 (L) 1.15 - 1.40 mmol/L   HCT 19.0 (L) 39.0 - 52.0 %   Hemoglobin 6.5 (LL) 13.0 - 17.0 g/dL    Sample type VENOUS    Comment NOTIFIED PHYSICIAN   Lactic acid, plasma     Status: None   Collection Time: 05/25/20  4:51 PM  Result Value Ref Range   Lactic Acid, Venous 1.9 0.5 - 1.9 mmol/L    Comment: Performed at [facility], [place] [address], [place], [place] [address]  SARS CORONAVIRUS 2 (TAT 6-24 HRS) Nasopharyngeal Nasopharyngeal Swab     Status: None   Collection Time: 05/25/20  9:01 PM   Specimen: Nasopharyngeal Swab  Result Value Ref Range   SARS Coronavirus 2 NEGATIVE NEGATIVE    Comment: (NOTE) SARS-CoV-2 target nucleic acids are NOT DETECTED.  The SARS-CoV-2 RNA is generally detectable in upper and lower respiratory specimens during the acute phase of infection. Negative results do not preclude SARS-CoV-2 infection, do not rule out co-infections with other pathogens, and should not be used as the sole basis for treatment or other patient management decisions. Negative results must be combined with clinical observations, patient history, and epidemiological information. The expected result is Negative.  Fact Sheet for Patients: SugarRoll.be  Fact Sheet for Healthcare Providers: https://www.woods-mathews.com/  This test is not yet approved or cleared by the [place] FDA and  has been authorized for detection and/or diagnosis of SARS-CoV-2 by  FDA under an Emergency Use Authorization (EUA). This EUA will remain  in effect (meaning this test can be used) for the duration of the COVID-19 declaration under Se ction 564(b)(1) of the Act, 21 U.S.C. section 360bbb-3(b)(1), unless the authorization is terminated or revoked sooner.  Performed at [facility], [place] [address] [place], [place] [address]   Osmolality     Status: None   Collection Time: 05/26/20  6:53 AM  Result Value Ref Range   Osmolality 280 275 - 295 mOsm/kg    Comment: Performed at [facility] [address] [place], [place] [address]   Basic metabolic panel     Status: Abnormal   Collection Time: 05/26/20  6:53 AM  Result Value Ref Range   Sodium 130 (L) 135 - 145 mmol/L   Potassium 3.5 3.5 - 5.1 mmol/L   Chloride 104 98 - 111 mmol/L   CO2 20 (L) 22 - 32 mmol/L   Glucose, Bld 99 70 - 99 mg/dL    Comment: Glucose reference range applies only to samples taken after fasting for at least 8 hours.   BUN 6 (L) 8 - 23 mg/dL   Creatinine, Ser 0.58 (L) 0.61 - 1.24 mg/dL   Calcium 7.7 (L) 8.9 - 10.3 mg/dL   GFR, Estimated >60 >60 mL/min    Comment: (NOTE) Calculated using the CKD-EPI Creatinine Equation (2021)    Anion gap 6 5 - 15    Comment: Performed at [facility] [address], [place], [place] [address]  Hepatic function panel     Status: Abnormal   Collection Time: 05/26/20  6:53 AM  Result Value Ref Range   Total Protein 5.6 (L) 6.5 - 8.1 g/dL   Albumin 2.0 (L) 3.5 - 5.0 g/dL   AST 23 15 - 41 U/L   ALT 14 0 - 44 U/L   Alkaline Phosphatase 83 38 - 126 U/L   Total Bilirubin 7.0 (H) 0.3 - 1.2 mg/dL    Comment: DELTA CHECK NOTED   Bilirubin, Direct 0.4 (H) 0.0 - 0.2 mg/dL   Indirect Bilirubin 6.6 (H) 0.3 - 0.9 mg/dL    Comment: Performed at [facility] [address], [place], [place] [address]  CBC     Status: Abnormal   Collection Time: 05/26/20  6:53 AM  Result Value Ref Range   WBC 5.7 4.0 - 10.5 K/uL   RBC 3.00 (L) 4.22 - 5.81 MIL/uL   Hemoglobin 6.9 (LL) 13.0 - 17.0 g/dL    Comment: REPEATED TO VERIFY POST TRANSFUSION SPECIMEN Reticulocyte Hemoglobin testing may be clinically indicated, consider ordering this additional test [id number]    HCT 23.2 (L) 39.0 - 52.0 %   MCV 77.3 (L) 80.0 - 100.0 fL   MCH 23.0 (L) 26.0 - 34.0 pg   MCHC 29.7 (L) 30.0 - 36.0 g/dL   RDW 21.1 (H) 11.5 - 15.5 %   Platelets 309 150 - 400 K/uL   nRBC 0.0 0.0 - 0.2 %    Comment: Performed at [facility], [place] [address], [place], [place] [address]  Prepare RBC (crossmatch)     Status: None    Collection Time: 05/26/20  8:39 AM  Result Value Ref Range   Order Confirmation      ORDER PROCESSED BY BLOOD BANK Performed at [facility], [place] [address] [place],  [address]     CT Head Wo Contrast  Result Date: 05/25/2020 CLINICAL  DATA:  Head trauma, minor. Neck trauma. Dizziness and lightheadedness for 1 week, spine pain radiating to neck. EXAM: CT HEAD WITHOUT CONTRAST CT CERVICAL SPINE WITHOUT CONTRAST TECHNIQUE: Multidetector CT imaging of the head and cervical spine was performed following the standard protocol without intravenous contrast. Multiplanar CT image reconstructions of the cervical spine were also generated. COMPARISON:  CT head/maxillofacial 12/14/2010. FINDINGS: CT HEAD FINDINGS Brain: Mild-to-moderate cerebral atrophy. Minimal patchy and ill-defined hypoattenuation within the cerebral white matter is nonspecific, but compatible with chronic small vessel ischemic disease. There is no acute intracranial hemorrhage. No demarcated cortical infarct. No extra-axial fluid collection. No evidence of intracranial mass. No midline shift. Vascular: No hyperdense vessel.  Atherosclerotic calcifications Skull: Normal. Negative for fracture or focal lesion. Sinuses/Orbits: Visualized orbits show no acute finding. Mucous retention cysts within the posterior right ethmoid air cell and within the right sphenoid sinus. There is otherwise only trace scattered paranasal sinus mucosal thickening at the imaged levels. CT CERVICAL SPINE FINDINGS Mildly motion degraded examination. Alignment: Reversal of the expected cervical lordosis. 1-2 mm C3-C4 and C4-C5 grade 1 anterolisthesis. 1-2 mm C5-C6 grade 1 retrolisthesis. Skull base and vertebrae: The basion-dental and atlanto-dental intervals are maintained.No evidence of acute fracture to the cervical spine. Soft tissues and spinal canal: No prevertebral fluid or swelling. No visible canal hematoma. Disc levels: Cervical spondylosis with shallow  multilevel disc bulges/central disc protrusions, uncovertebral hypertrophy and facet arthrosis. Facet joint ankylosis on the left at C4-C5. Disc space narrowing is greatest at C5-C6 and C6-C7 (advanced at these levels). No appreciable high-grade spinal canal stenosis. No high-grade bony neural foraminal narrowing. Upper chest: No consolidation within the imaged lung apices. No visible pneumothorax. Right apical bullae. IMPRESSION: CT head: 1. No evidence of acute intracranial abnormality. 2. Mild-to-moderate cerebral atrophy. 3. Mild cerebral white matter chronic small vessel ischemic disease. 4. Mild paranasal sinus disease as described. CT cervical spine: 1. Mildly motion degraded exam. 2. No evidence of acute fracture to the cervical spine. 3. Nonspecific reversal of the expected cervical lordosis. 4. Mild grade 1 spondylolisthesis at C3-C4, C4-C5 and C5-C6 as detailed. 5. Cervical spondylosis as described. Electronically Signed   By: Kellie Simmering DO   On: 05/25/2020 15:33   CT Cervical Spine Wo Contrast  Result Date: 05/25/2020 CLINICAL DATA:  Head trauma, minor. Neck trauma. Dizziness and lightheadedness for 1 week, spine pain radiating to neck. EXAM: CT HEAD WITHOUT CONTRAST CT CERVICAL SPINE WITHOUT CONTRAST TECHNIQUE: Multidetector CT imaging of the head and cervical spine was performed following the standard protocol without intravenous contrast. Multiplanar CT image reconstructions of the cervical spine were also generated. COMPARISON:  CT head/maxillofacial 12/14/2010. FINDINGS: CT HEAD FINDINGS Brain: Mild-to-moderate cerebral atrophy. Minimal patchy and ill-defined hypoattenuation within the cerebral white matter is nonspecific, but compatible with chronic small vessel ischemic disease. There is no acute intracranial hemorrhage. No demarcated cortical infarct. No extra-axial fluid collection. No evidence of intracranial mass. No midline shift. Vascular: No hyperdense vessel.  Atherosclerotic  calcifications Skull: Normal. Negative for fracture or focal lesion. Sinuses/Orbits: Visualized orbits show no acute finding. Mucous retention cysts within the posterior right ethmoid air cell and within the right sphenoid sinus. There is otherwise only trace scattered paranasal sinus mucosal thickening at the imaged levels. CT CERVICAL SPINE FINDINGS Mildly motion degraded examination. Alignment: Reversal of the expected cervical lordosis. 1-2 mm C3-C4 and C4-C5 grade 1 anterolisthesis. 1-2 mm C5-C6 grade 1 retrolisthesis. Skull base and vertebrae: The basion-dental and atlanto-dental intervals are maintained.No evidence of  acute fracture to the cervical spine. Soft tissues and spinal canal: No prevertebral fluid or swelling. No visible canal hematoma. Disc levels: Cervical spondylosis with shallow multilevel disc bulges/central disc protrusions, uncovertebral hypertrophy and facet arthrosis. Facet joint ankylosis on the left at C4-C5. Disc space narrowing is greatest at C5-C6 and C6-C7 (advanced at these levels). No appreciable high-grade spinal canal stenosis. No high-grade bony neural foraminal narrowing. Upper chest: No consolidation within the imaged lung apices. No visible pneumothorax. Right apical bullae. IMPRESSION: CT head: 1. No evidence of acute intracranial abnormality. 2. Mild-to-moderate cerebral atrophy. 3. Mild cerebral white matter chronic small vessel ischemic disease. 4. Mild paranasal sinus disease as described. CT cervical spine: 1. Mildly motion degraded exam. 2. No evidence of acute fracture to the cervical spine. 3. Nonspecific reversal of the expected cervical lordosis. 4. Mild grade 1 spondylolisthesis at C3-C4, C4-C5 and C5-C6 as detailed. 5. Cervical spondylosis as described. Electronically Signed   By: Kellie Simmering DO   On: 05/25/2020 15:33   DG Chest Port 1 View  Result Date: 05/25/2020 CLINICAL DATA:  Chest pain EXAM: PORTABLE CHEST 1 VIEW COMPARISON:  10/12/2019 FINDINGS: The  heart size and mediastinal contours are within normal limits. Emphysema. Both lungs are clear. No pleural effusion or pneumothorax. Chronic bilateral rib fractures. IMPRESSION: No acute process in the chest. Electronically Signed   By: Macy Mis M.D.   On: 05/25/2020 11:49   CT Angio Chest/Abd/Pel for Dissection W and/or Wo Contrast  Result Date: 05/25/2020 CLINICAL DATA:  65 year old with chest pain. Evaluate for an aortic dissection. EXAM: CT ANGIOGRAPHY CHEST, ABDOMEN AND PELVIS TECHNIQUE: Non-contrast CT of the chest was initially obtained. Multidetector CT imaging through the chest, abdomen and pelvis was performed using the standard protocol during bolus administration of intravenous contrast. Multiplanar reconstructed images and MIPs were obtained and reviewed to evaluate the vascular anatomy. CONTRAST:  153mL OMNIPAQUE IOHEXOL 350 MG/ML SOLN COMPARISON:  CT 09/09/2019 FINDINGS: CTA CHEST FINDINGS Cardiovascular: Aortic root at the sinuses of Valsalva measures 3.7 cm. Ascending thoracic aorta measures 3.2 cm without dissection. Typical three-vessel arch anatomy. Great vessels are patent. Proximal descending thoracic aorta measures 2.8 cm. Distal descending thoracic aorta measures 2.5 cm.Main pulmonary arteries are patent without large filling defects. Heart size is normal. No significant pericardial effusion. Mediastinum/Nodes: Small hiatal hernia. No significant chest lymphadenopathy. Lungs/Pleura: Scarring at the lung apices. Small amount of debris or mucus in the trachea. No large pleural effusions. Stable punctate nodule in the superior segment of the left lower lobe on sequence 8 image 47. No significant airspace disease or consolidation in the lungs. Negative for pneumothorax. Few patchy densities in the right upper lobe near the right minor fissure are suggestive for atelectasis or post inflammatory changes based on the configuration, sequence 6, image 37. Musculoskeletal: Old bilateral rib  fractures. No acute bone abnormality. Review of the MIP images confirms the above findings. CTA ABDOMEN AND PELVIS FINDINGS VASCULAR Aorta: Atherosclerotic disease in the abdominal aorta without aneurysm, dissection or significant stenosis. Celiac: Celiac trunk is widely patent. Splenic artery and left gastric artery are patent. Gastroduodenal artery originates directly from the celiac trunk. Proper hepatic artery comes off the SMA. SMA: SMA is widely patent. Renals: Both renal arteries are patent without evidence of aneurysm, dissection, vasculitis, fibromuscular dysplasia or significant stenosis. IMA: Patent Inflow: Atherosclerotic plaque in the iliac arteries without significant stenosis. Common, internal and external iliac arteries are patent bilaterally. Close to 50% narrowing in the proximal left common femoral artery.  Proximal femoral arteries are patent bilaterally. Veins: No obvious venous abnormality within the limitations of this arterial phase study. Review of the MIP images confirms the above findings. NON-VASCULAR Hepatobiliary: No appearance of the gallbladder. No acute abnormality to the liver. No significant biliary dilatation. Pancreas: Unremarkable. No pancreatic ductal dilatation or surrounding inflammatory changes. Spleen: Normal in size without focal abnormality. Adrenals/Urinary Tract: Normal appearance of the adrenal glands. Normal appearance of both kidneys without hydronephrosis. Normal appearance of the urinary bladder. Stomach/Bowel: Stomach is within normal limits. Appendix appears normal. No evidence of bowel wall thickening, distention, or inflammatory changes. Small hiatal hernia. Lymphatic: No lymph node enlargement in the abdomen or pelvis. Reproductive: Stable appearance of the prostate and seminal vesicles. Other: Negative for ascites.  Negative for free air. Musculoskeletal: No acute bone abnormality. Review of the MIP images confirms the above findings. IMPRESSION: 1. No acute  abnormality in the chest, abdomen or pelvis. Specifically, negative for an acute aortic injury or dissection. 2. Main visceral arteries are patent. No evidence for mesenteric ischemia. No acute bowel abnormality. 3. Old bilateral rib fractures. 4. New small densities in the anterior right upper lobe near the right minor fissure. Suspect these are postinflammatory or atelectasis. 5.  Aortic Atherosclerosis (ICD10-I70.0). Electronically Signed   By: Markus Daft M.D.   On: 05/25/2020 15:29    Review of Systems  Constitutional: Positive for activity change, appetite change, fatigue and unexpected weight change.  HENT: Negative.   Eyes: Negative.   Respiratory: Negative.   Cardiovascular: Negative.   Gastrointestinal: Negative for abdominal distention, abdominal pain, nausea and vomiting.  Endocrine: Negative.   Genitourinary: Negative.   Musculoskeletal: Negative.   Skin: Negative.   Allergic/Immunologic: Negative.   Neurological: Negative.   Hematological: Negative.   Psychiatric/Behavioral: Negative.    Blood pressure 124/90, pulse 86, temperature 97.6 F (36.4 C), temperature source Axillary, resp. rate 20, height 5\' 11"  (1.803 m), weight 56.8 kg, SpO2 100 %. Physical Exam Constitutional:      General: He is not in acute distress.    Appearance: Normal appearance. He is ill-appearing.  HENT:     Head: Normocephalic and atraumatic.  Cardiovascular:     Rate and Rhythm: Normal rate and regular rhythm.  Pulmonary:     Effort: Pulmonary effort is normal.  Abdominal:     General: Abdomen is flat. There is no distension.     Palpations: Abdomen is soft.     Tenderness: There is no abdominal tenderness.  Neurological:     Mental Status: He is alert.       Assessment/Plan: Marc Mcdonald is an 65 y.o. male with a new diagnosis of rectal cancer (15-18cm from anal verge). He presented with symptomatic anemia that had been worsening over the last 5 weeks. Overall he appears  deconditioned due to weight loss, recent loss of appetite, and fatigue.  On CT he has no metastatic disease to the liver or lung. CEA and MRI of pelvis pending. Colonoscopy was complete. Clinically and radiologically he is not obstructed.  Plan: Finish staging as ordered with MRI pelvis and CEA Diet as tolerated, add protein shakes to his diet for nutritional optimization Will decide on TNT vs surgery upfront depending on final staging on MRI Surgery will continue to follow. Please page with any questions or concerns  Caryn Bee 05/26/2020, 11:53 AM

## 2020-05-26 NOTE — Progress Notes (Signed)
Hgb/Hct = 6.9/23.2. Internal medicine resident service paged.

## 2020-05-26 NOTE — Progress Notes (Signed)
HD#1 Subjective:  Overnight Events: none  Patient underwent colonoscopy this morning and found to have a rectal mass.  We will went to see the patient after his procedure.  Patient appears comfortable.  States that he would like to eat.  His sons were also at bedside and states that if patient leaves the hospital, it is unlikely that he will follow-up.   His son states that patient has been taking ibuprofen excessively.  He can take 100-200 tablets in 4-5 days for his pain.   Family would like to [place].  Patient states that he wants to reverse his DNR status because his sons wanted him to.  I encourage family discussion about this matter and honor the patient's wishes.  Patient insists that he would like to be full code.  Objective:  Vital signs in last 24 hours: Vitals:   05/26/20 1110 05/26/20 1119 05/26/20 1127 05/26/20 1130  BP:  127/75 124/90   Pulse: 96 92 92 86  Resp: 18 (!) 22 17 20   Temp:      TempSrc:      SpO2: 99% 100% 100% 100%  Weight:      Height:       Supplemental O2: Room Air SpO2: 100 %   Physical Exam:  Physical Exam Constitutional:      General: He is not in acute distress. HENT:     Head: Normocephalic.  Cardiovascular:     Rate and Rhythm: Normal rate and regular rhythm.  Pulmonary:     Effort: Pulmonary effort is normal. No respiratory distress.  Abdominal:     General: Bowel sounds are normal. There is no distension.     Tenderness: There is no abdominal tenderness.  Skin:    General: Skin is warm.  Neurological:     Mental Status: He is alert.  Psychiatric:        Mood and Affect: Mood normal.     Filed Weights   05/25/20 2306 05/26/20 0447 05/26/20 0945  Weight: 57.6 kg 56.8 kg 56.8 kg     Intake/Output Summary (Last 24 hours) at 05/26/2020 1141 Last data filed at 05/26/2020 1054 Gross per 24 hour  Intake 2175 ml  Output 1800 ml  Net 375 ml   Net IO Since Admission: 375 mL [05/26/20 1141]  Pertinent  Labs: CBC Latest Ref Rng & Units 05/26/2020 05/25/2020 05/25/2020  WBC 4.0 - 10.5 K/uL 5.7 - 11.7(H)  Hemoglobin 13.0 - 17.0 g/dL 6.9(LL) 6.5(LL) 5.5(LL)  Hematocrit 39.0 - 52.0 % 23.2(L) 19.0(L) 21.3(L)  Platelets 150 - 400 K/uL 309 - 424(H)    CMP Latest Ref Rng & Units 05/26/2020 05/25/2020 05/25/2020  Glucose 70 - 99 mg/dL 99 - 135(H)  BUN 8 - 23 mg/dL 6(L) - 5(L)  Creatinine 0.61 - 1.24 mg/dL 0.58(L) - 0.70  Sodium 135 - 145 mmol/L 130(L) 127(L) 125(L)  Potassium 3.5 - 5.1 mmol/L 3.5 3.9 3.8  Chloride 98 - 111 mmol/L 104 - 95(L)  CO2 22 - 32 mmol/L 20(L) - 16(L)  Calcium 8.9 - 10.3 mg/dL 7.7(L) - 8.1(L)  Total Protein 6.5 - 8.1 g/dL 5.6(L) - 6.7  Total Bilirubin 0.3 - 1.2 mg/dL 7.0(H) - 2.1(H)  Alkaline Phos 38 - 126 U/L 83 - 98  AST 15 - 41 U/L 23 - 17  ALT 0 - 44 U/L 14 - 13    Imaging: CT Head Wo Contrast  Result Date: 05/25/2020 CLINICAL DATA:  Head trauma, minor. Neck trauma. Dizziness  and lightheadedness for 1 week, spine pain radiating to neck. EXAM: CT HEAD WITHOUT CONTRAST CT CERVICAL SPINE WITHOUT CONTRAST TECHNIQUE: Multidetector CT imaging of the head and cervical spine was performed following the standard protocol without intravenous contrast. Multiplanar CT image reconstructions of the cervical spine were also generated. COMPARISON:  CT head/maxillofacial 12/14/2010. FINDINGS: CT HEAD FINDINGS Brain: Mild-to-moderate cerebral atrophy. Minimal patchy and ill-defined hypoattenuation within the cerebral white matter is nonspecific, but compatible with chronic small vessel ischemic disease. There is no acute intracranial hemorrhage. No demarcated cortical infarct. No extra-axial fluid collection. No evidence of intracranial mass. No midline shift. Vascular: No hyperdense vessel.  Atherosclerotic calcifications Skull: Normal. Negative for fracture or focal lesion. Sinuses/Orbits: Visualized orbits show no acute finding. Mucous retention cysts within the posterior right ethmoid air cell  and within the right sphenoid sinus. There is otherwise only trace scattered paranasal sinus mucosal thickening at the imaged levels. CT CERVICAL SPINE FINDINGS Mildly motion degraded examination. Alignment: Reversal of the expected cervical lordosis. 1-2 mm C3-C4 and C4-C5 grade 1 anterolisthesis. 1-2 mm C5-C6 grade 1 retrolisthesis. Skull base and vertebrae: The basion-dental and atlanto-dental intervals are maintained.No evidence of acute fracture to the cervical spine. Soft tissues and spinal canal: No prevertebral fluid or swelling. No visible canal hematoma. Disc levels: Cervical spondylosis with shallow multilevel disc bulges/central disc protrusions, uncovertebral hypertrophy and facet arthrosis. Facet joint ankylosis on the left at C4-C5. Disc space narrowing is greatest at C5-C6 and C6-C7 (advanced at these levels). No appreciable high-grade spinal canal stenosis. No high-grade bony neural foraminal narrowing. Upper chest: No consolidation within the imaged lung apices. No visible pneumothorax. Right apical bullae. IMPRESSION: CT head: 1. No evidence of acute intracranial abnormality. 2. Mild-to-moderate cerebral atrophy. 3. Mild cerebral white matter chronic small vessel ischemic disease. 4. Mild paranasal sinus disease as described. CT cervical spine: 1. Mildly motion degraded exam. 2. No evidence of acute fracture to the cervical spine. 3. Nonspecific reversal of the expected cervical lordosis. 4. Mild grade 1 spondylolisthesis at C3-C4, C4-C5 and C5-C6 as detailed. 5. Cervical spondylosis as described. Electronically Signed   By: Kellie Simmering DO   On: 05/25/2020 15:33   CT Cervical Spine Wo Contrast  Result Date: 05/25/2020 CLINICAL DATA:  Head trauma, minor. Neck trauma. Dizziness and lightheadedness for 1 week, spine pain radiating to neck. EXAM: CT HEAD WITHOUT CONTRAST CT CERVICAL SPINE WITHOUT CONTRAST TECHNIQUE: Multidetector CT imaging of the head and cervical spine was performed following  the standard protocol without intravenous contrast. Multiplanar CT image reconstructions of the cervical spine were also generated. COMPARISON:  CT head/maxillofacial 12/14/2010. FINDINGS: CT HEAD FINDINGS Brain: Mild-to-moderate cerebral atrophy. Minimal patchy and ill-defined hypoattenuation within the cerebral white matter is nonspecific, but compatible with chronic small vessel ischemic disease. There is no acute intracranial hemorrhage. No demarcated cortical infarct. No extra-axial fluid collection. No evidence of intracranial mass. No midline shift. Vascular: No hyperdense vessel.  Atherosclerotic calcifications Skull: Normal. Negative for fracture or focal lesion. Sinuses/Orbits: Visualized orbits show no acute finding. Mucous retention cysts within the posterior right ethmoid air cell and within the right sphenoid sinus. There is otherwise only trace scattered paranasal sinus mucosal thickening at the imaged levels. CT CERVICAL SPINE FINDINGS Mildly motion degraded examination. Alignment: Reversal of the expected cervical lordosis. 1-2 mm C3-C4 and C4-C5 grade 1 anterolisthesis. 1-2 mm C5-C6 grade 1 retrolisthesis. Skull base and vertebrae: The basion-dental and atlanto-dental intervals are maintained.No evidence of acute fracture to the cervical spine. Soft tissues  and spinal canal: No prevertebral fluid or swelling. No visible canal hematoma. Disc levels: Cervical spondylosis with shallow multilevel disc bulges/central disc protrusions, uncovertebral hypertrophy and facet arthrosis. Facet joint ankylosis on the left at C4-C5. Disc space narrowing is greatest at C5-C6 and C6-C7 (advanced at these levels). No appreciable high-grade spinal canal stenosis. No high-grade bony neural foraminal narrowing. Upper chest: No consolidation within the imaged lung apices. No visible pneumothorax. Right apical bullae. IMPRESSION: CT head: 1. No evidence of acute intracranial abnormality. 2. Mild-to-moderate cerebral  atrophy. 3. Mild cerebral white matter chronic small vessel ischemic disease. 4. Mild paranasal sinus disease as described. CT cervical spine: 1. Mildly motion degraded exam. 2. No evidence of acute fracture to the cervical spine. 3. Nonspecific reversal of the expected cervical lordosis. 4. Mild grade 1 spondylolisthesis at C3-C4, C4-C5 and C5-C6 as detailed. 5. Cervical spondylosis as described. Electronically Signed   By: Kellie Simmering DO   On: 05/25/2020 15:33   CT Angio Chest/Abd/Pel for Dissection W and/or Wo Contrast  Result Date: 05/25/2020 CLINICAL DATA:  65 year old with chest pain. Evaluate for an aortic dissection. EXAM: CT ANGIOGRAPHY CHEST, ABDOMEN AND PELVIS TECHNIQUE: Non-contrast CT of the chest was initially obtained. Multidetector CT imaging through the chest, abdomen and pelvis was performed using the standard protocol during bolus administration of intravenous contrast. Multiplanar reconstructed images and MIPs were obtained and reviewed to evaluate the vascular anatomy. CONTRAST:  145mL OMNIPAQUE IOHEXOL 350 MG/ML SOLN COMPARISON:  CT 09/09/2019 FINDINGS: CTA CHEST FINDINGS Cardiovascular: Aortic root at the sinuses of Valsalva measures 3.7 cm. Ascending thoracic aorta measures 3.2 cm without dissection. Typical three-vessel arch anatomy. Great vessels are patent. Proximal descending thoracic aorta measures 2.8 cm. Distal descending thoracic aorta measures 2.5 cm.Main pulmonary arteries are patent without large filling defects. Heart size is normal. No significant pericardial effusion. Mediastinum/Nodes: Small hiatal hernia. No significant chest lymphadenopathy. Lungs/Pleura: Scarring at the lung apices. Small amount of debris or mucus in the trachea. No large pleural effusions. Stable punctate nodule in the superior segment of the left lower lobe on sequence 8 image 47. No significant airspace disease or consolidation in the lungs. Negative for pneumothorax. Few patchy densities in the right  upper lobe near the right minor fissure are suggestive for atelectasis or post inflammatory changes based on the configuration, sequence 6, image 37. Musculoskeletal: Old bilateral rib fractures. No acute bone abnormality. Review of the MIP images confirms the above findings. CTA ABDOMEN AND PELVIS FINDINGS VASCULAR Aorta: Atherosclerotic disease in the abdominal aorta without aneurysm, dissection or significant stenosis. Celiac: Celiac trunk is widely patent. Splenic artery and left gastric artery are patent. Gastroduodenal artery originates directly from the celiac trunk. Proper hepatic artery comes off the SMA. SMA: SMA is widely patent. Renals: Both renal arteries are patent without evidence of aneurysm, dissection, vasculitis, fibromuscular dysplasia or significant stenosis. IMA: Patent Inflow: Atherosclerotic plaque in the iliac arteries without significant stenosis. Common, internal and external iliac arteries are patent bilaterally. Close to 50% narrowing in the proximal left common femoral artery. Proximal femoral arteries are patent bilaterally. Veins: No obvious venous abnormality within the limitations of this arterial phase study. Review of the MIP images confirms the above findings. NON-VASCULAR Hepatobiliary: No appearance of the gallbladder. No acute abnormality to the liver. No significant biliary dilatation. Pancreas: Unremarkable. No pancreatic ductal dilatation or surrounding inflammatory changes. Spleen: Normal in size without focal abnormality. Adrenals/Urinary Tract: Normal appearance of the adrenal glands. Normal appearance of both kidneys without hydronephrosis. Normal  appearance of the urinary bladder. Stomach/Bowel: Stomach is within normal limits. Appendix appears normal. No evidence of bowel wall thickening, distention, or inflammatory changes. Small hiatal hernia. Lymphatic: No lymph node enlargement in the abdomen or pelvis. Reproductive: Stable appearance of the prostate and seminal  vesicles. Other: Negative for ascites.  Negative for free air. Musculoskeletal: No acute bone abnormality. Review of the MIP images confirms the above findings. IMPRESSION: 1. No acute abnormality in the chest, abdomen or pelvis. Specifically, negative for an acute aortic injury or dissection. 2. Main visceral arteries are patent. No evidence for mesenteric ischemia. No acute bowel abnormality. 3. Old bilateral rib fractures. 4. New small densities in the anterior right upper lobe near the right minor fissure. Suspect these are postinflammatory or atelectasis. 5.  Aortic Atherosclerosis (ICD10-I70.0). Electronically Signed   By: Markus Daft M.D.   On: 05/25/2020 15:29    Assessment/Plan:   Active Problems:   Symptomatic anemia   Iron deficiency anemia due to chronic blood loss   Duodenal ulcer   Gastritis and gastroduodenitis   Gastroesophageal reflux disease with esophagitis without hemorrhage   Esophageal stricture   Rectal mass   Patient Summary: Patient is 65 yo gentleman with past medical history of questionable COPD, past left 5,6,8 ribs fracture who presented to the ED for dizziness, dyspnea with exertion, likely due to symptomatic anemia, found to have duodenal ulcer and a rectal mass.   Symptomatic anemia Rectal mass Patient underwent colonoscopy and upper EGD today.  EGD showed benign esophageal stricture, gastritis and duodenal ulcer, likely secondary from his NSAIDs use and alcohol.  Colonoscopy showed a malignant partially obstructing tumor in the proximal rectum.  CTA performed in the ED did not show any obvious masses anywhere else.  Will obtain pelvis MRI for local staging.  Also obtain CEA per GI.  Surgery and oncology was consulted. His hemoglobin was 6.9 after received 2 units of blood yesterday.  Patient got additional unit this morning. -Appreciate GI, surgery and oncology recommendation -Pending pathology result -Protonix and sucralfate per GI -Check H&H  posttransfusion. Transfuse if Hgb < 7 -Pending echocardiogram  Moderate Hyponatremia-likely due to poor p.o. intake Sodium improved to 130 this morning after 1.5L normal saline.  Serum osmolality of 280.  Pending urine sodium and urine osmolality. -BMP in a.m.  Elevated bilirubin No sign of biliary dilation on CT, gallbladder not visualized. No acute abnormality of liver. Likely from his GI bleed. -LFT in am  Alcohol abuse Patient does not appears in active withdrawal.  CIWA was 1 yesterday and has not received any Ativan. -CIWA w Ativan.   New lung density Suspect postinflammatory or atelectasis. Will monitor   Diet: liquid IVF: None,none VTE: SCDs Code: Full PT/OT recs: None, none.  Dispo: Anticipated discharge to [place], [place], DO 05/26/2020, 11:41 AM Pager: [phone]  Please contact the on call pager after 5 pm and on weekends at [phone].

## 2020-05-26 NOTE — Interval H&P Note (Signed)
History and Physical Interval Note:  05/26/2020 9:50 AM  Marc Mcdonald  has presented today for surgery, with the diagnosis of Anemia.  FOBT positive stool.  Dizziness..  The various methods of treatment have been discussed with the patient and family. After consideration of risks, benefits and other options for treatment, the patient has consented to  Procedure(s): ESOPHAGOGASTRODUODENOSCOPY (EGD) WITH PROPOFOL (N/A) COLONOSCOPY WITH PROPOFOL (N/A) as a surgical intervention.  The patient's history has been reviewed, patient examined, no change in status, stable for surgery.  I have reviewed the patient's chart and labs.  Questions were answered to the patient's satisfaction.     Marc Mcdonald Marc Mcdonald

## 2020-05-26 NOTE — Anesthesia Postprocedure Evaluation (Signed)
Anesthesia Post Note  Patient: Marc Mcdonald  Procedure(s) Performed: ESOPHAGOGASTRODUODENOSCOPY (EGD) WITH PROPOFOL (N/A ) COLONOSCOPY WITH PROPOFOL (N/A ) BIOPSY BALLOON DILATION (N/A )     Patient location during evaluation: PACU Anesthesia Type: MAC Level of consciousness: patient cooperative, oriented and sedated Pain management: pain level controlled Vital Signs Assessment: post-procedure vital signs reviewed and stable Respiratory status: nonlabored ventilation, spontaneous breathing and respiratory function stable Cardiovascular status: blood pressure returned to baseline and stable Postop Assessment: no apparent nausea or vomiting Anesthetic complications: no   No complications documented.  Last Vitals:  Vitals:   05/26/20 1127 05/26/20 1130  BP: 124/90   Pulse: 92 86  Resp: 17 20  Temp:    SpO2: 100% 100%    Last Pain:  Vitals:   05/26/20 1109  TempSrc: Axillary  PainSc:                  [name],E. Rozelle Caudle

## 2020-05-27 ENCOUNTER — Inpatient Hospital Stay (HOSPITAL_COMMUNITY): Payer: Medicare Other

## 2020-05-27 ENCOUNTER — Encounter (HOSPITAL_COMMUNITY): Payer: Self-pay | Admitting: Gastroenterology

## 2020-05-27 DIAGNOSIS — D649 Anemia, unspecified: Secondary | ICD-10-CM | POA: Diagnosis not present

## 2020-05-27 DIAGNOSIS — R0602 Shortness of breath: Secondary | ICD-10-CM

## 2020-05-27 DIAGNOSIS — K269 Duodenal ulcer, unspecified as acute or chronic, without hemorrhage or perforation: Secondary | ICD-10-CM | POA: Diagnosis not present

## 2020-05-27 DIAGNOSIS — D500 Iron deficiency anemia secondary to blood loss (chronic): Secondary | ICD-10-CM | POA: Diagnosis not present

## 2020-05-27 DIAGNOSIS — K6289 Other specified diseases of anus and rectum: Secondary | ICD-10-CM | POA: Diagnosis not present

## 2020-05-27 LAB — TYPE AND SCREEN
ABO/RH(D): B POS
Antibody Screen: NEGATIVE
Unit division: 0
Unit division: 0
Unit division: 0

## 2020-05-27 LAB — BASIC METABOLIC PANEL
Anion gap: 4 — ABNORMAL LOW (ref 5–15)
BUN: <5 mg/dL — ABNORMAL LOW (ref 8–23)
CO2: 21 mmol/L — ABNORMAL LOW (ref 22–32)
Calcium: 7.7 mg/dL — ABNORMAL LOW (ref 8.9–10.3)
Chloride: 106 mmol/L (ref 98–111)
Creatinine, Ser: 0.61 mg/dL (ref 0.61–1.24)
GFR, Estimated: >60 mL/min (ref >60)
Glucose, Bld: 87 mg/dL (ref 70–99)
Potassium: 3.2 mmol/L — ABNORMAL LOW (ref 3.5–5.1)
Sodium: 131 mmol/L — ABNORMAL LOW (ref 135–145)

## 2020-05-27 LAB — CBC
HCT: 23.2 % — ABNORMAL LOW (ref 39.0–52.0)
HCT: 27.2 % — ABNORMAL LOW (ref 39.0–52.0)
Hemoglobin: 6.9 g/dL — CL (ref 13.0–17.0)
Hemoglobin: 8.4 g/dL — ABNORMAL LOW (ref 13.0–17.0)
MCH: 23 pg — ABNORMAL LOW (ref 26.0–34.0)
MCH: 25.1 pg — ABNORMAL LOW (ref 26.0–34.0)
MCHC: 29.7 g/dL — ABNORMAL LOW (ref 30.0–36.0)
MCHC: 30.9 g/dL (ref 30.0–36.0)
MCV: 77.3 fL — ABNORMAL LOW (ref 80.0–100.0)
MCV: 81.2 fL (ref 80.0–100.0)
Platelets: 309 10*3/uL (ref 150–400)
Platelets: 295 10*3/uL (ref 150–400)
RBC: 3 MIL/uL — ABNORMAL LOW (ref 4.22–5.81)
RBC: 3.35 MIL/uL — ABNORMAL LOW (ref 4.22–5.81)
RDW: 21.1 % — ABNORMAL HIGH (ref 11.5–15.5)
RDW: 20.8 % — ABNORMAL HIGH (ref 11.5–15.5)
WBC: 5.7 10*3/uL (ref 4.0–10.5)
WBC: 8.4 10*3/uL (ref 4.0–10.5)
nRBC: 0 % (ref 0.0–0.2)
nRBC: 0 % (ref 0.0–0.2)

## 2020-05-27 LAB — ECHOCARDIOGRAM COMPLETE
AR max vel: 3.47 cm2
AV Area VTI: 3.09 cm2
AV Area mean vel: 3.12 cm2
AV Mean grad: 3 mmHg
AV Peak grad: 5.3 mmHg
Ao pk vel: 1.15 m/s
Area-P 1/2: 3.89 cm2
Height: 71 in
S' Lateral: 2.8 cm
Weight: 2155.22 oz

## 2020-05-27 LAB — HEPATIC FUNCTION PANEL
ALT: 12 U/L (ref 0–44)
AST: 18 U/L (ref 15–41)
Albumin: 1.9 g/dL — ABNORMAL LOW (ref 3.5–5.0)
Alkaline Phosphatase: 68 U/L (ref 38–126)
Bilirubin, Direct: 0.3 mg/dL — ABNORMAL HIGH (ref 0.0–0.2)
Indirect Bilirubin: 3.7 mg/dL — ABNORMAL HIGH (ref 0.3–0.9)
Total Bilirubin: 4 mg/dL — ABNORMAL HIGH (ref 0.3–1.2)
Total Protein: 5.1 g/dL — ABNORMAL LOW (ref 6.5–8.1)

## 2020-05-27 LAB — BPAM RBC
Blood Product Expiration Date: 202204072359
Blood Product Expiration Date: 202204072359
Blood Product Expiration Date: 202204182359
ISSUE DATE / TIME: 202204011603
ISSUE DATE / TIME: 202204020011
ISSUE DATE / TIME: 202204020925
Unit Type and Rh: 7300
Unit Type and Rh: 7300
Unit Type and Rh: 7300

## 2020-05-27 LAB — DIC (DISSEMINATED INTRAVASCULAR COAGULATION)PANEL
D-Dimer, Quant: <0.27 ug/mL-FEU (ref 0.00–0.50)
Fibrinogen: 244 mg/dL (ref 210–475)
INR: 1.2 (ref 0.8–1.2)
Platelets: 269 10*3/uL (ref 150–400)
Prothrombin Time: 14.5 seconds (ref 11.4–15.2)
Smear Review: NONE SEEN
aPTT: 32 seconds (ref 24–36)

## 2020-05-27 LAB — MAGNESIUM: Magnesium: 1.8 mg/dL (ref 1.7–2.4)

## 2020-05-27 LAB — CEA: CEA: 5.6 ng/mL — ABNORMAL HIGH (ref 0.0–4.7)

## 2020-05-27 IMAGING — MR MR PELVIS W/O CM
8 series · 48 of 48 positions shown · non-contrast
Comparison: [DATE]

CLINICAL DATA: 64-year-old with rectal mass discovered on
colonoscopy.

EXAM:
MRI PELVIS WITHOUT CONTRAST
TECHNIQUE: Multiplanar multisequence MR imaging of the pelvis was performed. No
intravenous contrast was administered. Ultrasound gel was
administered per rectum to optimize tumor evaluation.

[Series 10: T2 · sagittal · 3.0mm · 0.88mm/px · 3 of 25 slices shown (1 of 6)]
[im 1/25]
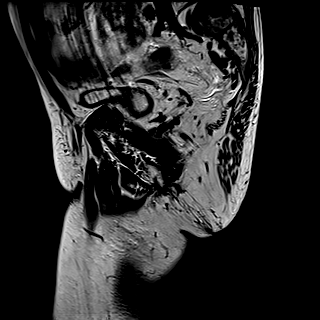
[im 13/25]
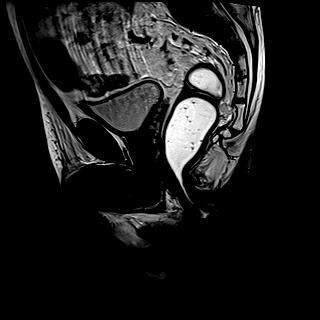
[im 25/25]
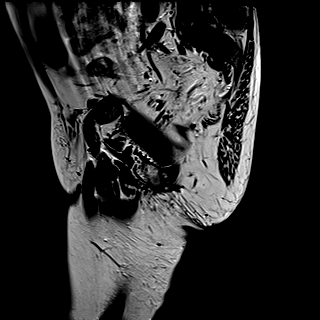

[Series 11: T2 · axial · 5.0mm · 0.88mm/px · z∈[-120,+54]mm · 4 of 30 slices shown (2 of 6)]
[im 1/30]
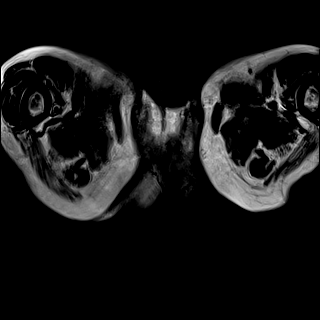
[im 10/30]
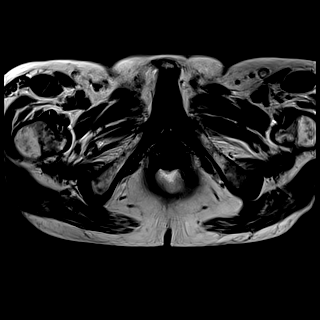
[im 20/30]
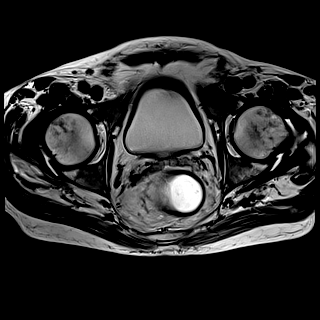
[im 30/30]
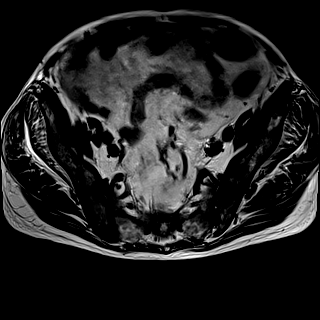

[Series 12: DWI · axial · 5.0mm · 1.48mm/px · z∈[-120,+54]mm · 12 of 90 slices shown (1 of 2)]
[im 1/90]
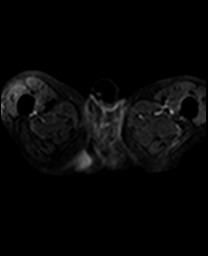
[im 9/90]
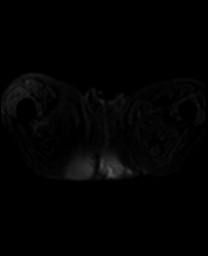
[im 17/90]
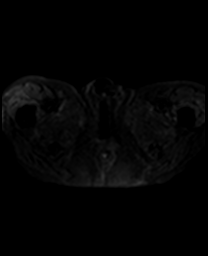
[im 25/90]
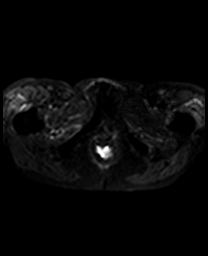
[im 33/90]
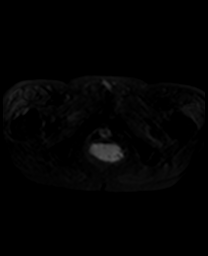
[im 41/90]
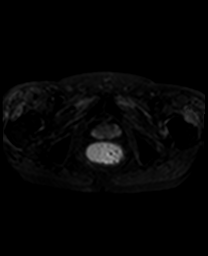
[im 49/90]
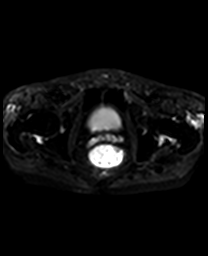
[im 57/90]
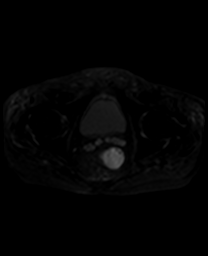
[im 65/90]
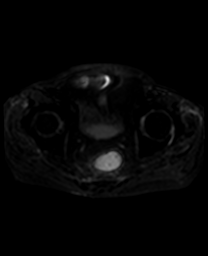
[im 73/90]
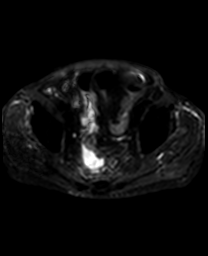
[im 81/90]
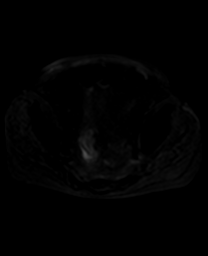
[im 90/90]
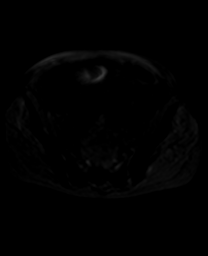

[Series 13: DWI · axial · 5.0mm · 1.48mm/px · z∈[-120,+54]mm · 4 of 30 slices shown (2 of 2)]
[im 1/30]
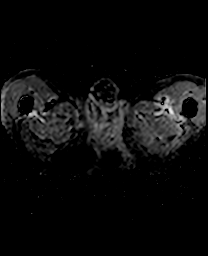
[im 10/30]
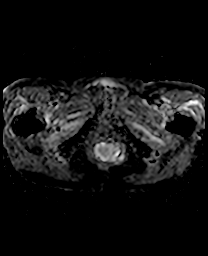
[im 20/30]
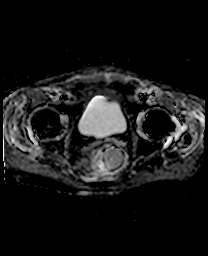
[im 30/30]
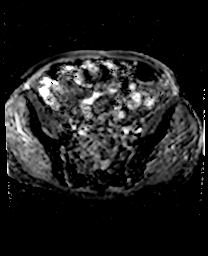

[Series 14: T2 · coronal · 3.0mm · 0.88mm/px · 5 of 40 slices shown (3 of 6)]
[im 1/40]
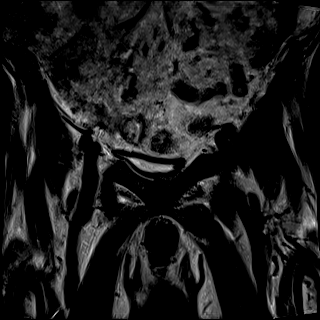
[im 10/40]
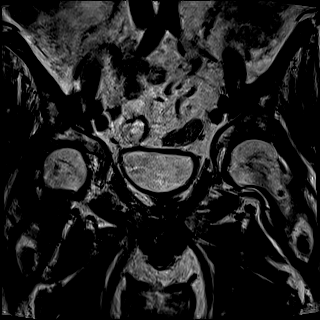
[im 20/40]
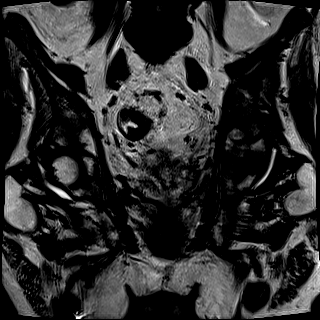
[im 30/40]
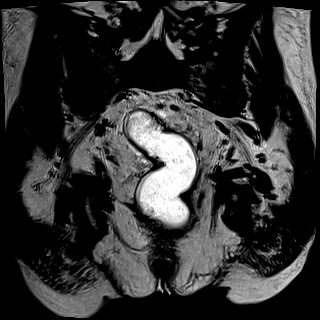
[im 40/40]
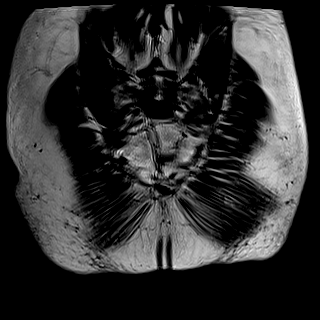

[Series 15: T2 · axial · 5.0mm · 0.70mm/px · z∈[-115,+59]mm · 4 of 30 slices shown (4 of 6)]
[im 1/30]
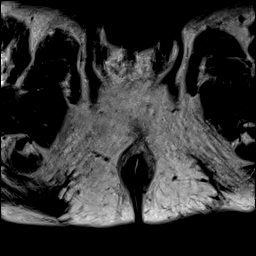
[im 10/30]
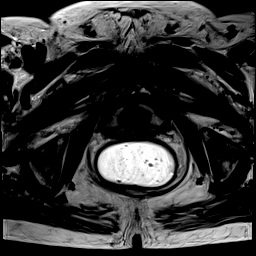
[im 20/30]
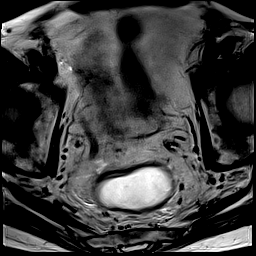
[im 30/30]
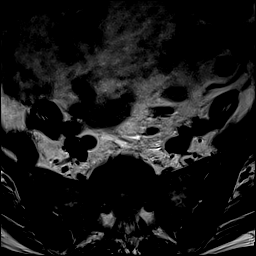

[Series 16: T2 · coronal · 3.0mm · 0.70mm/px · 5 of 40 slices shown (5 of 6)]
[im 1/40]
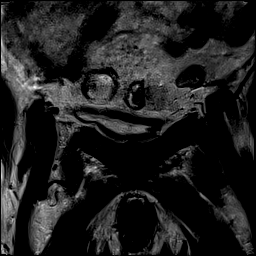
[im 10/40]
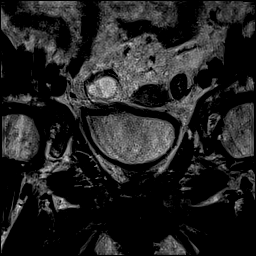
[im 20/40]
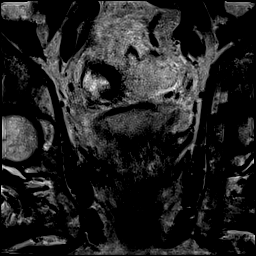
[im 30/40]
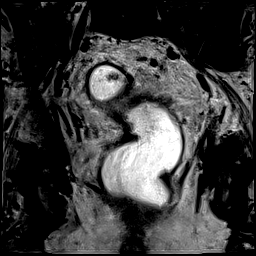
[im 40/40]
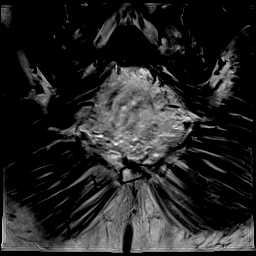

[Series 17: T2 · axial · 1.8mm · 1.04mm/px · z∈[-82,+61]mm · 11 of 80 slices shown (6 of 6)]
[im 1/80]
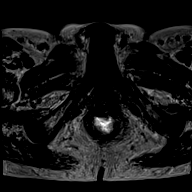
[im 8/80]
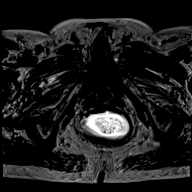
[im 16/80]
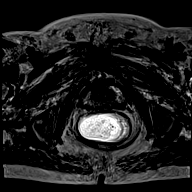
[im 24/80]
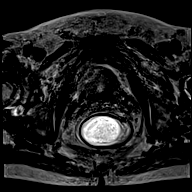
[im 32/80]
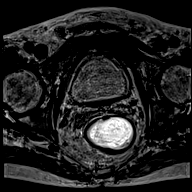
[im 40/80]
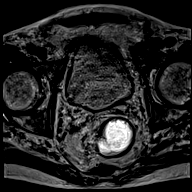
[im 48/80]
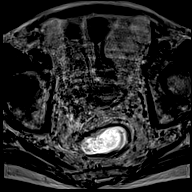
[im 56/80]
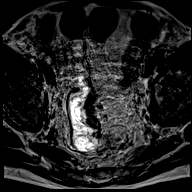
[im 64/80]
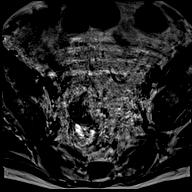
[im 72/80]
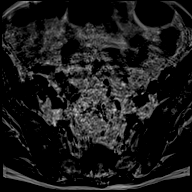
[im 80/80]
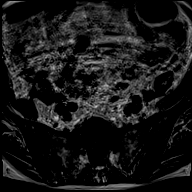

[48 of 48 positions shown; findings below may reference images not displayed]

FINDINGS: TUMOR LOCATION

Tumor distance from Anal Verge/Skin Surface:  16 cm

Tumor distance to Internal Anal Sphincter: 10.4 cm

TUMOR DESCRIPTION

Circumferential Extent: Eccentric favoring the LEFT lateral
rectosigmoid colon.

Tumor Length: 4 cm cm

T - CATEGORY

Extension through Muscularis Propria: Yes T3 B with LEFT lateral
extension of tumor that appears to extend 2-3 mm beyondw the wall of
the colon/rectum. (Image 22, series 16)

Shortest Distance of any tumor/node from Mesorectal Fascia: Tumor
arises at or near the APR. Perhaps better classified as a sigmoid
colon cancer rather than a rectal cancer based on position, the APR
is faintly seen on image 7 of series 10. Tumor is just above the
anterior peritoneal reflection.

Extramural Vascular Invasion/Tumor Thrombus: No

Invasion of Anterior Peritoneal Reflection: No

Involvement of Adjacent Organs or Pelvic Sidewall: No

Levator Ani Involvement: No

N - CATEGORY

Mesorectal Lymph Nodes >=5mm: None

Extra-mesorectal Lymphadenopathy: None visualized on limited pelvic
coverage.

Other: Heterogeneous marrow signal likely relates to reported
chronic anemia.
IMPRESSION: Findings of T3 B, N0, BUCHELI colorectal cancer. Though tumor is more
compatible with a sigmoid rather than rectal cancer based on
position of tumor as compared to the anterior peritoneal reflection.
Note that the exam shows limited assessment with respect to anatomic
boundaries due to motion artifact.

## 2020-05-27 MED ORDER — POTASSIUM CHLORIDE CRYS ER 20 MEQ PO TBCR
40.0000 meq | EXTENDED_RELEASE_TABLET | Freq: Two times a day (BID) | ORAL | Status: AC
  Administered 2020-05-27 (×2): 40 meq via ORAL
  Filled 2020-05-27 (×2): qty 2

## 2020-05-27 MED ORDER — LORAZEPAM 1 MG PO TABS
1.0000 mg | ORAL_TABLET | Freq: Once | ORAL | Status: AC | PRN
  Administered 2020-05-27: 1 mg via ORAL
  Filled 2020-05-27: qty 1

## 2020-05-27 MED ORDER — FLEET ENEMA 7-19 GM/118ML RE ENEM
1.0000 | ENEMA | Freq: Once | RECTAL | Status: AC
  Administered 2020-05-27: 1 via RECTAL
  Filled 2020-05-27: qty 1

## 2020-05-27 NOTE — Progress Notes (Signed)
  Echocardiogram 2D Echocardiogram has been performed.  Merrie Roof F 05/27/2020, 9:34 AM

## 2020-05-27 NOTE — Progress Notes (Signed)
Fleets enema given x 1 pre med for MRI. MRI tech notified.

## 2020-05-27 NOTE — Progress Notes (Signed)
Marc Mcdonald   DOB:[birth year]   UX#:[id number]   GUR#:[id number]  Hematology and oncology follow-up  Subjective: Patient is clinically stable, denies pain or other new symptoms.  He tolerated IV Feraheme very well this morning.  He is awaiting for pelvic MRI to be done.  He is on liquid diet, has not had a bowel movement since colonoscopy yesterday.  His son was at bedside with saw him.   Objective:  Vitals:   05/27/20 1233 05/27/20 1314  BP: (!) 118/92   Pulse: (!) 106 100  Resp: 20 20  Temp:    SpO2: 100% 99%    Body mass index is 18.79 kg/m.  Intake/Output Summary (Last 24 hours) at 05/27/2020 1509 Last data filed at 05/27/2020 0700 Gross per 24 hour  Intake 600 ml  Output 750 ml  Net -150 ml     He appears to be jaundiced  No leg edema   CBG (last 3)  No results for input(s): GLUCAP in the last 72 hours.   Labs:  Urine Studies No results for input(s): UHGB, CRYS in the last 72 hours.  Invalid input(s): UACOL, UAPR, USPG, UPH, UTP, UGL, UKET, UBIL, UNIT, UROB, ULEU, UEPI, UWBC, URBC, UBAC, CAST, [place], [place]  Basic Metabolic Panel: Recent Labs  Lab 05/25/20 1104 05/25/20 1305 05/26/20 0653 05/27/20 0254  NA 125* 127* 130* 131*  K 3.8 3.9 3.5 3.2*  CL 95*  --  104 106  CO2 16*  --  20* 21*  GLUCOSE 135*  --  99 87  BUN 5*  --  6* <5*  CREATININE 0.70  --  0.58* 0.61  CALCIUM 8.1*  --  7.7* 7.7*  MG  --   --   --  1.8   GFR Estimated Creatinine Clearance: 79.6 mL/min (by C-G formula based on SCr of 0.61 mg/dL). Liver Function Tests: Recent Labs  Lab 05/25/20 1104 05/26/20 0653 05/27/20 0254  AST 17 23 18   ALT 13 14 12   ALKPHOS 98 83 68  BILITOT 2.1* 7.0* 4.0*  PROT 6.7 5.6* 5.1*  ALBUMIN 2.4* 2.0* 1.9*   No results for input(s): LIPASE, AMYLASE in the last 168 hours. No results for input(s): AMMONIA in the last 168 hours. Coagulation profile Recent Labs  Lab 05/26/20 1454  INR 1.2    CBC: Recent Labs  Lab 05/25/20 1104 05/25/20 1305  05/26/20 0653 05/26/20 1406 05/26/20 1454 05/27/20 0254  WBC 11.7*  --  5.7  --   --  8.4  NEUTROABS 8.7*  --   --   --   --   --   HGB 5.5* 6.5* 6.9* 8.7* 8.4* 8.4*  HCT 21.3* 19.0* 23.2* 28.0* 27.1* 27.2*  MCV 77.2*  --  77.3*  --   --  81.2  PLT 424*  --  309  --  269 295   Cardiac Enzymes: No results for input(s): CKTOTAL, CKMB, CKMBINDEX, TROPONINI in the last 168 hours. BNP: Invalid input(s): POCBNP CBG: No results for input(s): GLUCAP in the last 168 hours. D-Dimer Recent Labs    05/26/20 1454  DDIMER <0.00*   Hgb A1c No results for input(s): HGBA1C in the last 72 hours. Lipid Profile No results for input(s): CHOL, HDL, LDLCALC, TRIG, CHOLHDL, LDLDIRECT in the last 72 hours. Thyroid function studies No results for input(s): TSH, T4TOTAL, T3FREE, THYROIDAB in the last 72 hours.  Invalid input(s): FREET3 Anemia work up Recent Labs    05/26/20 1406 05/26/20 [place]  --  516  FOLATE  --  8.8  FERRITIN  --  10*  TIBC  --  286  IRON  --  247*  RETICCTPCT 2.7  --    Microbiology Recent Results (from the past 240 hour(s))  SARS CORONAVIRUS 2 (TAT 6-24 HRS) Nasopharyngeal Nasopharyngeal Swab     Status: None   Collection Time: 05/25/20  9:01 PM   Specimen: Nasopharyngeal Swab  Result Value Ref Range Status   SARS Coronavirus 2 NEGATIVE NEGATIVE Final    Comment: (NOTE) SARS-CoV-2 target nucleic acids are NOT DETECTED.  The SARS-CoV-2 RNA is generally detectable in upper and lower respiratory specimens during the acute phase of infection. Negative results do not preclude SARS-CoV-2 infection, do not rule out co-infections with other pathogens, and should not be used as the sole basis for treatment or other patient management decisions. Negative results must be combined with clinical observations, patient history, and epidemiological information. The expected result is Negative.  Fact Sheet for  Patients: SugarRoll.be  Fact Sheet for Healthcare Providers: https://www.woods-mathews.com/  This test is not yet approved or cleared by the [place] FDA and  has been authorized for detection and/or diagnosis of SARS-CoV-2 by FDA under an Emergency Use Authorization (EUA). This EUA will remain  in effect (meaning this test can be used) for the duration of the COVID-19 declaration under Se ction 564(b)(1) of the Act, 21 U.S.C. section 360bbb-3(b)(1), unless the authorization is terminated or revoked sooner.  Performed at [facility], [place] [address] [place], [place] [address]       Studies:  ECHOCARDIOGRAM COMPLETE  Result Date: 05/27/2020    ECHOCARDIOGRAM REPORT   Patient Name:   Marc Mcdonald Date of Exam: 05/27/2020 Medical Rec #:  [id number]       Height:       71.0 in Accession #:    [id number]      Weight:       134.7 lb Date of Birth:  1956-02-08        BSA:          1.783 m Patient Age:    65 years        BP:           129/82 mmHg Patient Gender: M               HR:           104 bpm. Exam Location:  Inpatient Procedure: 2D Echo, Cardiac Doppler and Color Doppler Indications:    R06.02 SOB  History:        Patient has no prior history of Echocardiogram examinations.                 Tumor in colon.  Sonographer:    Merrie Roof RDCS Referring Phys: [id number] [place]  1. Left ventricular ejection fraction, by estimation, is 65 to 70%. The left ventricle has normal function. Left ventricular endocardial border not optimally defined to evaluate regional wall motion. Left ventricular diastolic parameters are consistent with Grade I diastolic dysfunction (impaired relaxation).  2. Right ventricular systolic function is hyperdynamic. The right ventricular size is normal.  3. The mitral valve is grossly normal. No evidence of mitral valve regurgitation.  4. The aortic valve is grossly normal. Aortic valve regurgitation is not  visualized.  5. The inferior vena cava is dilated in size with >50% respiratory variability, suggesting right atrial pressure of 8 mmHg. Comparison(s): No prior Echocardiogram. Conclusion(s)/Recommendation(s): Technically difficult study.  FINDINGS  Left Ventricle: Left ventricular ejection fraction, by estimation, is 65 to 70%. The left ventricle has normal function. Left ventricular endocardial border not optimally defined to evaluate regional wall motion. The left ventricular internal cavity size was normal in size. There is no left ventricular hypertrophy. Left ventricular diastolic parameters are consistent with Grade I diastolic dysfunction (impaired relaxation). Right Ventricle: The right ventricular size is normal. Right vetricular wall thickness was not well visualized. Right ventricular systolic function is hyperdynamic. Left Atrium: Left atrial size was normal in size. Right Atrium: Right atrial size was normal in size. Pericardium: Trivial pericardial effusion is present. Mitral Valve: The mitral valve is grossly normal. No evidence of mitral valve regurgitation. Tricuspid Valve: The tricuspid valve is not well visualized. Tricuspid valve regurgitation is not demonstrated. No evidence of tricuspid stenosis. Aortic Valve: The aortic valve is grossly normal. Aortic valve regurgitation is not visualized. Aortic valve mean gradient measures 3.0 mmHg. Aortic valve peak gradient measures 5.3 mmHg. Aortic valve area, by VTI measures 3.09 cm. Pulmonic Valve: The pulmonic valve was not well visualized. Pulmonic valve regurgitation is not visualized. No evidence of pulmonic stenosis. Aorta: The aortic root is normal in size and structure. Venous: The inferior vena cava is dilated in size with greater than 50% respiratory variability, suggesting right atrial pressure of 8 mmHg. IAS/Shunts: The interatrial septum was not well visualized.  LEFT VENTRICLE PLAX 2D LVIDd:         4.00 cm  Diastology LVIDs:         2.80  cm  LV e' medial:    7.94 cm/s LV PW:         0.90 cm  LV E/e' medial:  10.6 LV IVS:        1.00 cm  LV e' lateral:   9.14 cm/s LVOT diam:     2.00 cm  LV E/e' lateral: 9.2 LV SV:         57 LV SV Index:   32 LVOT Area:     3.14 cm  RIGHT VENTRICLE          IVC RV Basal diam:  2.80 cm  IVC diam: 2.10 cm LEFT ATRIUM           Index      RIGHT ATRIUM           Index LA diam:      2.90 cm 1.63 cm/m RA Area:     16.00 cm LA Vol (A4C): 16.5 ml 9.26 ml/m RA Volume:   39.20 ml  21.99 ml/m  AORTIC VALVE AV Area (Vmax):    3.47 cm AV Area (Vmean):   3.12 cm AV Area (VTI):     3.09 cm AV Vmax:           115.00 cm/s AV Vmean:          82.900 cm/s AV VTI:            0.184 m AV Peak Grad:      5.3 mmHg AV Mean Grad:      3.0 mmHg LVOT Vmax:         127.00 cm/s LVOT Vmean:        82.200 cm/s LVOT VTI:          0.181 m LVOT/AV VTI ratio: 0.98  AORTA Ao Root diam: 3.40 cm MITRAL VALVE MV Area (PHT): 3.89 cm    SHUNTS MV Decel Time: 195 msec    Systemic VTI:  0.18  m MV E velocity: 84.20 cm/s  Systemic Diam: 2.00 cm MV A velocity: 89.70 cm/s MV E/A ratio:  0.94 Rudean Haskell MD Electronically signed by Rudean Haskell MD Signature Date/Time: 05/27/2020/1:47:48 PM    Final     Assessment: 65 y.o. male with past medical history of COPD, heavy smoking and alcohol drinking, presented with symptomatic anemia  1. Rectal tumor, likely rectal cancer, biopsy result pending  2.  Severe symptomatic anemia, from iron deficiency  3. COPD  4. Hyperbilirubinemia, with predominant indirect bilirubin, no lab evidence of hemolysis. 5.  Duodenal ulcer 6.  Alcohol abuse, history of heavy smoking.   Plan:  -He tolerated IV iron very well this morning, I plan to give him 1 more dose in the next few weeks after discharge.  Please start him on oral iron. His anemia is stable -He has no lab evidence of hemolysis, his total bilirubin is slightly decreased this morning, with predominant indirect bilirubin -I spoke with MRI  technician this morning, due to the need of a body radiologist on site during the MRI, they are trying to coordinate and get the MRI done today or tomorrow. No IV contrast is needed for pelvic MRI for rectal cancer staging, radiology will change the order. -I will follow-up tomorrow afternoon, to review his biopsy results and MRI results and finalize his treatment plan. -all questions were answered   Truitt Merle, MD 05/27/2020

## 2020-05-27 NOTE — Progress Notes (Signed)
HD#2 Subjective:   No significant overnight events. No complaints this morning. Ate well for breakfast.   Objective:  Vital signs in last 24 hours: Vitals:   05/26/20 1246 05/26/20 2012 05/27/20 0645 05/27/20 0734  BP: 128/81 140/67 129/76 129/82  Pulse: 94 100 95 92  Resp:  (!) 23 (!) 24 20  Temp:  97.7 F (36.5 C) 97.9 F (36.6 C) 98.7 F (37.1 C)  TempSrc:  Oral Oral Oral  SpO2: 100% 100% 100% 98%  Weight:   61.1 kg   Height:       Supplemental O2: Room Air SpO2: 98 %   Physical Exam:   General: chronically ill appearing, muscle wasting Cardiac: RRR Abdomen: soft, non-tender Neuro: a/o x4.   Filed Weights   05/26/20 0447 05/26/20 0945 05/27/20 0645  Weight: 56.8 kg 56.8 kg 61.1 kg     Intake/Output Summary (Last 24 hours) at 05/27/2020 0855 Last data filed at 05/27/2020 0700 Gross per 24 hour  Intake 900 ml  Output 750 ml  Net 150 ml   Net IO Since Admission: 225 mL [05/27/20 0855]  Pertinent Labs: CBC Latest Ref Rng & Units 05/26/2020 05/26/2020 05/26/2020  WBC 4.0 - 10.5 K/uL - - 5.7  Hemoglobin 13.0 - 17.0 g/dL 8.4(L) 8.7(L) 6.9(LL)  Hematocrit 39.0 - 52.0 % 27.1(L) 28.0(L) 23.2(L)  Platelets 150 - 400 K/uL 269 - 309    CMP Latest Ref Rng & Units 05/27/2020 05/26/2020 05/25/2020  Glucose 70 - 99 mg/dL 87 99 -  BUN 8 - 23 mg/dL <5(L) 6(L) -  Creatinine 0.61 - 1.24 mg/dL 0.61 0.58(L) -  Sodium 135 - 145 mmol/L 131(L) 130(L) 127(L)  Potassium 3.5 - 5.1 mmol/L 3.2(L) 3.5 3.9  Chloride 98 - 111 mmol/L 106 104 -  CO2 22 - 32 mmol/L 21(L) 20(L) -  Calcium 8.9 - 10.3 mg/dL 7.7(L) 7.7(L) -  Total Protein 6.5 - 8.1 g/dL - 5.6(L) -  Total Bilirubin 0.3 - 1.2 mg/dL - 7.0(H) -  Alkaline Phos 38 - 126 U/L - 83 -  AST 15 - 41 U/L - 23 -  ALT 0 - 44 U/L - 14 -    Imaging: No results found.  Assessment/Plan:   Active Problems:   Symptomatic anemia   Iron deficiency anemia due to chronic blood loss   Duodenal ulcer   Gastritis and gastroduodenitis    Gastroesophageal reflux disease with esophagitis without hemorrhage   Esophageal stricture   Rectal mass   Patient Summary: Patient is 65 yo gentleman with past medical history of questionable COPD, past left 5,6,8 ribs fracture who presented to the ED for dizziness, dyspnea with exertion, likely due to symptomatic anemia, found to have duodenal ulcer and a rectal mass.   Rectal mass. Suspect pathology will return as a neoplasm. Noted to be partially obstructive with mild oozing. Fortunately,  -oncology and surgery are following -CEA in process -abdominal MRI ordered for staging purposes -f/u tissue pathology  Non-bleeding duodenal ulcer. Likely in the context of excessive NSAID use, heavy alcohol use and smoking Grade B esophagitis without bleeding, gastritis s/p bx, moderate esophageal stenosis s/p dilation -GI following -continue sucrafate -continue protonix 40mg  po BID x10w, then reduce to 40mg  daily -f/u gastric pathology  Grade II internal hemorrhoids. Stool softener daily  Iron deficiency anemia 2/2 GI bleed. Now s/p 3U PRBC transfusions since admission Post transfusion h/h appropriately corrected. RPI 1.8 indicating a mild hypoproliferation. Hemolytic process considered in light of indirect hyperbilirubinemia however workup,  including LDH, DAT, DIC panel panel unremarkable. B12, folate levels normal.  Plan: -f/u haptoglobin -daily CBC. Transfusion goal <7  Moderate Hyponatremia-likely due to poor p.o. intake. Improving.  Hypokalemia. Check magnesium. Replete. Daily bmp.   Indirect hyperbilirubinemia. Hemolysis workup negative thus far. No evidence of biliary disease on imaging. Question reabsorbion of blood products from GI bleeding. Awaiting morning labs.   Alcohol use disorder. CIWAs have remain 0. Does not appear to be in active withdrawal. -CIWA w Ativan.   New lung densities which are questioned to be post inflammatory vs atelectasis.  May need a repeat chest  CT at some point for further evaluation   VTE: SCDs Code: Full Dispo: Will likely be stable for discharge home following completion of MRI and hemoglobin stabilization in [place], MD Internal Medicine Resident PGY-2 [name] Pager: [phone] 05/27/2020 9:34 AM    Please contact the on call pager after 5 pm and on weekends at [phone].

## 2020-05-27 NOTE — Plan of Care (Signed)
  Problem: Clinical Measurements: Goal: Ability to maintain clinical measurements within normal limits will improve Outcome: Progressing Goal: Will remain free from infection Outcome: Progressing Goal: Respiratory complications will improve Outcome: Progressing Goal: Cardiovascular complication will be avoided Outcome: Progressing   Problem: Activity: Goal: Risk for activity intolerance will decrease Outcome: Progressing   

## 2020-05-28 ENCOUNTER — Telehealth (HOSPITAL_BASED_OUTPATIENT_CLINIC_OR_DEPARTMENT_OTHER): Payer: Self-pay

## 2020-05-28 DIAGNOSIS — K269 Duodenal ulcer, unspecified as acute or chronic, without hemorrhage or perforation: Secondary | ICD-10-CM | POA: Diagnosis not present

## 2020-05-28 DIAGNOSIS — K6289 Other specified diseases of anus and rectum: Secondary | ICD-10-CM | POA: Diagnosis not present

## 2020-05-28 DIAGNOSIS — E43 Unspecified severe protein-calorie malnutrition: Secondary | ICD-10-CM | POA: Diagnosis present

## 2020-05-28 DIAGNOSIS — D649 Anemia, unspecified: Secondary | ICD-10-CM | POA: Diagnosis not present

## 2020-05-28 DIAGNOSIS — D500 Iron deficiency anemia secondary to blood loss (chronic): Secondary | ICD-10-CM | POA: Diagnosis not present

## 2020-05-28 LAB — COMPREHENSIVE METABOLIC PANEL
ALT: 13 U/L (ref 0–44)
AST: 16 U/L (ref 15–41)
Albumin: 2 g/dL — ABNORMAL LOW (ref 3.5–5.0)
Alkaline Phosphatase: 67 U/L (ref 38–126)
Anion gap: 9 (ref 5–15)
BUN: <5 mg/dL — ABNORMAL LOW (ref 8–23)
CO2: 22 mmol/L (ref 22–32)
Calcium: 8.2 mg/dL — ABNORMAL LOW (ref 8.9–10.3)
Chloride: 103 mmol/L (ref 98–111)
Creatinine, Ser: 0.57 mg/dL — ABNORMAL LOW (ref 0.61–1.24)
GFR, Estimated: >60 mL/min (ref >60)
Glucose, Bld: 73 mg/dL (ref 70–99)
Potassium: 3.9 mmol/L (ref 3.5–5.1)
Sodium: 134 mmol/L — ABNORMAL LOW (ref 135–145)
Total Bilirubin: 2.2 mg/dL — ABNORMAL HIGH (ref 0.3–1.2)
Total Protein: 5.6 g/dL — ABNORMAL LOW (ref 6.5–8.1)

## 2020-05-28 LAB — CBC
HCT: 28.7 % — ABNORMAL LOW (ref 39.0–52.0)
Hemoglobin: 8.6 g/dL — ABNORMAL LOW (ref 13.0–17.0)
MCH: 24.6 pg — ABNORMAL LOW (ref 26.0–34.0)
MCHC: 30 g/dL (ref 30.0–36.0)
MCV: 82.2 fL (ref 80.0–100.0)
Platelets: 302 10*3/uL (ref 150–400)
RBC: 3.49 MIL/uL — ABNORMAL LOW (ref 4.22–5.81)
RDW: 21.5 % — ABNORMAL HIGH (ref 11.5–15.5)
WBC: 7.5 10*3/uL (ref 4.0–10.5)
nRBC: 0 % (ref 0.0–0.2)

## 2020-05-28 LAB — HAPTOGLOBIN: Haptoglobin: 120 mg/dL (ref 32–363)

## 2020-05-28 MED ORDER — POLYSACCHARIDE IRON COMPLEX 150 MG PO CAPS
150.0000 mg | ORAL_CAPSULE | ORAL | Status: DC
  Administered 2020-05-28: 150 mg via ORAL
  Filled 2020-05-28: qty 1

## 2020-05-28 MED ORDER — ADULT MULTIVITAMIN W/MINERALS CH
1.0000 | ORAL_TABLET | Freq: Every day | ORAL | Status: DC
  Administered 2020-05-28 – 2020-06-01 (×4): 1 via ORAL
  Filled 2020-05-28 (×6): qty 1

## 2020-05-28 MED ORDER — ENSURE ENLIVE PO LIQD
237.0000 mL | Freq: Three times a day (TID) | ORAL | Status: DC
  Administered 2020-05-28 – 2020-06-01 (×4): 237 mL via ORAL

## 2020-05-28 NOTE — Consult Note (Signed)
[facility] Nurse requested for preoperative stoma site marking  Discussed surgical procedure and stoma creation with patient.   Explained role of the [place] nurse team.  Provided the patient with educational booklet and provided samples of pouching options.  Answered patient questions as I could, but patient does not seem to have a clear understanding of why he is requiring surgery or an ostomy. We discussed his anemia and bleeding and I have informed him that the MD will discuss the results of his MRI with him in detail.  He is Patent attorney.   Examined patient lying, sitting, and standing in order to place the marking in the patient's visual field, away from any creases or abdominal contour issues and within the rectus muscle.  Attempted to mark below the patient's belt line.   Marked for colostomy in the LLQ  4  cm to the left of the umbilicus and 4 _cm below the umbilicus.  Marked for ileostomy in the RLQ  4 cm to the right of the umbilicus and  3 cm below the umbilicus. Patient wears pants very low.     Patient's abdomen cleansed with CHG wipes at site markings, allowed to air dry prior to marking.Covered mark with thin film transparent dressing to preserve mark until date of surgery.   [facility] Nurse team will follow up with patient after surgery for continue ostomy care and teaching.   Domenic Moras MSN, RN, FNP-BC [facility] Wound, Ostomy, Continence Nurse Pager [phone]

## 2020-05-28 NOTE — Progress Notes (Addendum)
HD#3 Subjective:  Overnight Events: none   Patient is seen at bedside. He appears comfortable and in no acute distress.  States that he is eating well and has finished his breakfast.  Denies abdominal pain.  Objective:  Vital signs in last 24 hours: Vitals:   05/27/20 1753 05/27/20 2143 05/28/20 0018 05/28/20 0447  BP:  115/86 113/77 109/82  Pulse:  93 90 100  Resp: (!) 21 (!) 22 (!) 23 (!) 25  Temp:  97.6 F (36.4 C) 97.8 F (36.6 C) 97.7 F (36.5 C)  TempSrc:  Oral Oral Oral  SpO2:  99% 100% 100%  Weight:    56.5 kg  Height:       Supplemental O2: Room Air SpO2: 100 %   Physical Exam:  Physical Exam Constitutional:      General: He is not in acute distress. HENT:     Head: Normocephalic.  Cardiovascular:     Rate and Rhythm: Normal rate and regular rhythm.  Abdominal:     General: Bowel sounds are normal.     Tenderness: There is no abdominal tenderness.  Neurological:     Mental Status: He is alert.  Psychiatric:        Mood and Affect: Mood normal.     Filed Weights   05/26/20 0945 05/27/20 0645 05/28/20 0447  Weight: 56.8 kg 61.1 kg 56.5 kg     Intake/Output Summary (Last 24 hours) at 05/28/2020 0726 Last data filed at 05/28/2020 0700 Gross per 24 hour  Intake 440 ml  Output 2000 ml  Net -1560 ml   Net IO Since Admission: -1,335 mL [05/28/20 0726]  Pertinent Labs: CBC Latest Ref Rng & Units 05/27/2020 05/26/2020 05/26/2020  WBC 4.0 - 10.5 K/uL 8.4 - -  Hemoglobin 13.0 - 17.0 g/dL 8.4(L) 8.4(L) 8.7(L)  Hematocrit 39.0 - 52.0 % 27.2(L) 27.1(L) 28.0(L)  Platelets 150 - 400 K/uL 295 269 -    CMP Latest Ref Rng & Units 05/27/2020 05/26/2020 05/25/2020  Glucose 70 - 99 mg/dL 87 99 -  BUN 8 - 23 mg/dL <5(L) 6(L) -  Creatinine 0.61 - 1.24 mg/dL 0.61 0.58(L) -  Sodium 135 - 145 mmol/L 131(L) 130(L) 127(L)  Potassium 3.5 - 5.1 mmol/L 3.2(L) 3.5 3.9  Chloride 98 - 111 mmol/L 106 104 -  CO2 22 - 32 mmol/L 21(L) 20(L) -  Calcium 8.9 - 10.3 mg/dL 7.7(L) 7.7(L)  -  Total Protein 6.5 - 8.1 g/dL 5.1(L) 5.6(L) -  Total Bilirubin 0.3 - 1.2 mg/dL 4.0(H) 7.0(H) -  Alkaline Phos 38 - 126 U/L 68 83 -  AST 15 - 41 U/L 18 23 -  ALT 0 - 44 U/L 12 14 -    Imaging: ECHOCARDIOGRAM COMPLETE  Result Date: 05/27/2020    ECHOCARDIOGRAM REPORT   Patient Name:   Marc Mcdonald Date of Exam: 05/27/2020 Medical Rec #:  [id number]       Height:       71.0 in Accession #:    [id number]      Weight:       134.7 lb Date of Birth:  Aug 16, 1955        BSA:          1.783 m Patient Age:    65 years        BP:           129/82 mmHg Patient Gender: M  HR:           104 bpm. Exam Location:  Inpatient Procedure: 2D Echo, Cardiac Doppler and Color Doppler Indications:    R06.02 SOB  History:        Patient has no prior history of Echocardiogram examinations.                 Tumor in colon.  Sonographer:    Merrie Roof RDCS Referring Phys: [id number] [place]  1. Left ventricular ejection fraction, by estimation, is 65 to 70%. The left ventricle has normal function. Left ventricular endocardial border not optimally defined to evaluate regional wall motion. Left ventricular diastolic parameters are consistent with Grade I diastolic dysfunction (impaired relaxation).  2. Right ventricular systolic function is hyperdynamic. The right ventricular size is normal.  3. The mitral valve is grossly normal. No evidence of mitral valve regurgitation.  4. The aortic valve is grossly normal. Aortic valve regurgitation is not visualized.  5. The inferior vena cava is dilated in size with >50% respiratory variability, suggesting right atrial pressure of 8 mmHg. Comparison(s): No prior Echocardiogram. Conclusion(s)/Recommendation(s): Technically difficult study. FINDINGS  Left Ventricle: Left ventricular ejection fraction, by estimation, is 65 to 70%. The left ventricle has normal function. Left ventricular endocardial border not optimally defined to evaluate regional wall motion. The left  ventricular internal cavity size was normal in size. There is no left ventricular hypertrophy. Left ventricular diastolic parameters are consistent with Grade I diastolic dysfunction (impaired relaxation). Right Ventricle: The right ventricular size is normal. Right vetricular wall thickness was not well visualized. Right ventricular systolic function is hyperdynamic. Left Atrium: Left atrial size was normal in size. Right Atrium: Right atrial size was normal in size. Pericardium: Trivial pericardial effusion is present. Mitral Valve: The mitral valve is grossly normal. No evidence of mitral valve regurgitation. Tricuspid Valve: The tricuspid valve is not well visualized. Tricuspid valve regurgitation is not demonstrated. No evidence of tricuspid stenosis. Aortic Valve: The aortic valve is grossly normal. Aortic valve regurgitation is not visualized. Aortic valve mean gradient measures 3.0 mmHg. Aortic valve peak gradient measures 5.3 mmHg. Aortic valve area, by VTI measures 3.09 cm. Pulmonic Valve: The pulmonic valve was not well visualized. Pulmonic valve regurgitation is not visualized. No evidence of pulmonic stenosis. Aorta: The aortic root is normal in size and structure. Venous: The inferior vena cava is dilated in size with greater than 50% respiratory variability, suggesting right atrial pressure of 8 mmHg. IAS/Shunts: The interatrial septum was not well visualized.  LEFT VENTRICLE PLAX 2D LVIDd:         4.00 cm  Diastology LVIDs:         2.80 cm  LV e' medial:    7.94 cm/s LV PW:         0.90 cm  LV E/e' medial:  10.6 LV IVS:        1.00 cm  LV e' lateral:   9.14 cm/s LVOT diam:     2.00 cm  LV E/e' lateral: 9.2 LV SV:         57 LV SV Index:   32 LVOT Area:     3.14 cm  RIGHT VENTRICLE          IVC RV Basal diam:  2.80 cm  IVC diam: 2.10 cm LEFT ATRIUM           Index      RIGHT ATRIUM  Index LA diam:      2.90 cm 1.63 cm/m RA Area:     16.00 cm LA Vol (A4C): 16.5 ml 9.26 ml/m RA Volume:    39.20 ml  21.99 ml/m  AORTIC VALVE AV Area (Vmax):    3.47 cm AV Area (Vmean):   3.12 cm AV Area (VTI):     3.09 cm AV Vmax:           115.00 cm/s AV Vmean:          82.900 cm/s AV VTI:            0.184 m AV Peak Grad:      5.3 mmHg AV Mean Grad:      3.0 mmHg LVOT Vmax:         127.00 cm/s LVOT Vmean:        82.200 cm/s LVOT VTI:          0.181 m LVOT/AV VTI ratio: 0.98  AORTA Ao Root diam: 3.40 cm MITRAL VALVE MV Area (PHT): 3.89 cm    SHUNTS MV Decel Time: 195 msec    Systemic VTI:  0.18 m MV E velocity: 84.20 cm/s  Systemic Diam: 2.00 cm MV A velocity: 89.70 cm/s MV E/A ratio:  0.94 Rudean Haskell MD Electronically signed by Rudean Haskell MD Signature Date/Time: 05/27/2020/1:47:48 PM    Final     Assessment/Plan:   Active Problems:   Symptomatic anemia   Iron deficiency anemia due to chronic blood loss   Duodenal ulcer   Gastritis and gastroduodenitis   Gastroesophageal reflux disease with esophagitis without hemorrhage   Esophageal stricture   Rectal mass   Patient Summary: Patient is 65 yo gentleman with past medical history of questionable COPD, past left 5,6,8 ribs fracture who presented to the ED for dizziness, dyspnea with exertion, likely due to symptomatic anemia, found to have duodenal ulcer and a rectal mass.    Colorectal mass (T3B, N0, Mx) Suspect pathology will return as a neoplasm. Noted to be partially obstructive with mild oozing.  Pelvis MRI was negative for metastatic disease.  -Initial CEA value of 5.6 -Surgery recommended partial colectomy with colostomy this admission. -Oncology following -f/u tissue pathology    Non-bleeding duodenal ulcer Grade B esophagitis without bleeding, gastritis s/p bx, moderate esophageal stenosis s/p dilation Likely in the context of excessive NSAID use, heavy alcohol use and smoking -GI following -continue sucrafate -continue protonix 40mg  po BID x10w, then reduce to 40mg  daily -f/u gastric pathology     Iron  deficiency anemia 2/2 GI bleed. Ferritin was low at 10.  Patient received 1 dose of Feraheme.   Oral iron may be considered on discharge Hemolytic process considered in light of indirect hyperbilirubinemia however workup, including LDH, DAT, DIC panel panel unremarkable.  Hyperbilirubinemia improving. B12, folate levels normal.  --f/u haptoglobin -daily CBC. Transfusion goal <7    Mild Hyponatremia Likely due to increased T bili.  Serum osmolarity of 280, consistent with normal tonic hyponatremia.  Sodium level improving. -Daily BMP   Alcohol use disorder Does not appear to be in active withdrawal. -CIWA w Ativan.     New lung densities which are questioned to be post inflammatory vs atelectasis.       VTE: SCDs Code: Full Dispo: Pending partial colectomy/colostomy  Gaylan Gerold, DO 05/28/2020, 7:26 AM Pager: [phone]  Please contact the on call pager after 5 pm and on weekends at [phone].

## 2020-05-28 NOTE — Telephone Encounter (Signed)
Records received for Continuity of Care Records give to Dr. [place] to review

## 2020-05-28 NOTE — Evaluation (Signed)
Occupational Therapy Evaluation Patient Details Name: Marc Mcdonald MRN: [id number] DOB: 06-Aug-1955 Today's Date: 05/28/2020    History of Present Illness Pt adm 4/1 with dizziness, weakness, and dyspnea. Pt with Hgb of 5.5 and found to have duodenal ulcer and rectal mass. Oncology and surgery involved and staging of rectal CA in process. PMH - ?copd, lt rib fx's 08/2019, etoh   Clinical Impression   Pt admitted to ED for the diagnosis reported above. Pt reported that he has a history of falling due to dizziness in the last 3 months. Pt demonstrated good sitting balance to complete ADL's as needed in sitting. When standing to complete ADL's pt requires supervision-min guard due to safety. Additionally, pt demonstrated decreased safety awareness. At this time pt does not require any OT follow ups once discharged, however after surgery, discharge recommendations may be updated. Pt will benefit from acute OT continuing to address the concerns listed below.     Follow Up Recommendations  No OT follow up    Equipment Recommendations  Tub/shower seat    Recommendations for Other Services       Precautions / Restrictions Precautions Precautions: Fall Restrictions Weight Bearing Restrictions: No      Mobility Bed Mobility Overal bed mobility: Modified Independent             General bed mobility comments: Pt HOB was elevated to 20 degrees for supine to sit and he used bed rails to assist with sitting up.    Transfers Overall transfer level: Needs assistance Equipment used: None Transfers: Sit to/from Stand Sit to Stand: Supervision         General transfer comment: supervision for safety    Balance Overall balance assessment: Needs assistance Sitting-balance support: No upper extremity supported;Feet supported Sitting balance-Leahy Scale: Good     Standing balance support: No upper extremity supported;During functional activity Standing balance-Leahy Scale:  Fair Standing balance comment: unsteady with dynamic but no overt loss of balance                           ADL either performed or assessed with clinical judgement   ADL Overall ADL's : Needs assistance/impaired Eating/Feeding: Independent;Sitting Eating/Feeding Details (indicate cue type and reason): Pt able to open can of soda and bring to his mouth to take a sip. Grooming: Wash/dry hands;Supervision/safety;Standing Grooming Details (indicate cue type and reason): completed standing at sink.         Upper Body Dressing : Independent;Sitting Upper Body Dressing Details (indicate cue type and reason): Pt able to don hospital gown on back while maintaining sitting balance. Lower Body Dressing: Supervision/safety;Sitting/lateral leans Lower Body Dressing Details (indicate cue type and reason): Pt able to reach down and pull up socks with supervision for safety. Toilet Transfer: Magazine features editor Details (indicate cue type and reason): Pt able to ambulate to bathroom with no DME, complete sit/stand, and return to bed with min guard for safety.         Functional mobility during ADLs: Min guard General ADL Comments: Pt able to complete all ADL's needed in sitting, independently, however when activity requires standing and functional mobility, pt requires sup-min guard for safety.     Vision   Vision Assessment?: No apparent visual deficits     Perception Perception Perception Tested?: No   Praxis Praxis Praxis tested?: Not tested    Pertinent Vitals/Pain Pain Assessment: No/denies pain     Hand Dominance  Extremity/Trunk Assessment Upper Extremity Assessment Upper Extremity Assessment: Generalized weakness   Lower Extremity Assessment Lower Extremity Assessment: Defer to PT evaluation   Cervical / Trunk Assessment Cervical / Trunk Assessment: Normal   Communication Communication Communication: No difficulties   Cognition Arousal/Alertness:  Awake/alert Behavior During Therapy: Agitated;Impulsive Overall Cognitive Status: No family/caregiver present to determine baseline cognitive functioning                                 General Comments: Pt lacks awareness of safety, deficits, and medical concerns. Pt very agitated reporting that too many people have been in and out of his room.   General Comments  VSS with ambulating in room    Exercises     Shoulder Instructions      Home Living Family/patient expects to be discharged to:: Private residence Living Arrangements: Children Available Help at Discharge: Family;Available PRN/intermittently Type of Home: House Home Access: Stairs to enter [employer] of Steps: 2 Entrance Stairs-Rails: None Home Layout: One level     Bathroom Shower/Tub: Teacher, early years/pre: Standard     Home Equipment: None          Prior Functioning/Environment Level of Independence: Independent  Gait / Transfers Assistance Needed: Pt has history of falls in recent months. Pt reports that falls have been due to dizzyness and etoh use.              OT Problem List: Decreased strength;Decreased activity tolerance;Impaired balance (sitting and/or standing);Decreased safety awareness;Decreased knowledge of use of DME or AE      OT Treatment/Interventions: Self-care/ADL training;Therapeutic exercise;Energy conservation;DME and/or AE instruction;Therapeutic activities;Balance training;Patient/family education    OT Goals(Current goals can be found in the care plan section) Acute Rehab OT Goals Patient Stated Goal: go home OT Goal Formulation: With patient Time For Goal Achievement: 06/11/20 Potential to Achieve Goals: Good ADL Goals Pt Will Perform Grooming: Independently;standing Additional ADL Goal #1: Pt will perform UE and LE dressing sitting, with mod I, using AE as needed. Additional ADL Goal #2: Pt will report and demonstrate 3 fall  prevention techniques.  OT Frequency: Min 2X/week   Barriers to D/C:            Co-evaluation              AM-PAC OT "6 Clicks" Daily Activity     Outcome Measure Help from another person eating meals?: None Help from another person taking care of personal grooming?: None Help from another person toileting, which includes using toliet, bedpan, or urinal?: A Little Help from another person bathing (including washing, rinsing, drying)?: A Little Help from another person to put on and taking off regular upper body clothing?: A Little Help from another person to put on and taking off regular lower body clothing?: A Little 6 Click Score: 20   End of Session Equipment Utilized During Treatment: Gait belt Nurse Communication: Mobility status;Other (comment) (Pt cognition)  Activity Tolerance: Patient tolerated treatment well Patient left: in bed;with bed alarm set;with call bell/phone within reach  OT Visit Diagnosis: Unsteadiness on feet (R26.81);Other abnormalities of gait and mobility (R26.89);Muscle weakness (generalized) (M62.81)                Time: 8144-8185 OT Time Calculation (min): 17 min Charges:  OT General Charges $OT Visit: 1 Visit OT Evaluation $OT Eval Moderate Complexity: [place]., OTR/L [place]  Elane Yolanda Bonine 05/28/2020, 1:54 PM

## 2020-05-28 NOTE — Progress Notes (Signed)
[facility] Progress Note  2 Days Post-Op  Subjective: CC:  C/o bug bites on his chest wall. Asking to speak to Dr. Burr Medico. Denies abdominal pain, nausea, or vomiting. States his last BM was yesterday - unsure what it looked like because he said he does not look at it. We discussed that he has a likely malignant colon mass causing his bleeding. I recommended surgery this admission and he asked to talk about it with his kids. States he thought he was going home today.   Objective: Vital signs in last 24 hours: Temp:  [97.5 F (36.4 C)-98.3 F (36.8 C)] 98.3 F (36.8 C) (04/04 0817) Pulse Rate:  [90-106] 91 (04/04 0817) Resp:  [20-25] 20 (04/04 0817) BP: (109-118)/(77-92) 114/78 (04/04 0817) SpO2:  [98 %-100 %] 98 % (04/04 0817) Weight:  [56.5 kg] 56.5 kg (04/04 0447) Last BM Date: 05/26/20  Intake/Output from previous day: 04/03 0701 - 04/04 0700 In: 440 [P.O.:440] Out: 2000 [Urine:2000] Intake/Output this shift: Total I/O In: 240 [P.O.:240] Out: -   PE: Gen:  Alert, NAD, unpleasant, cooperative.  Card:  Regular rate and rhythm, pedal pulses 2+ BL Pulm:  Normal effort, clear to auscultation bilaterally Abd: Soft, non-tender, distended, bowel sounds present  Skin: warm and dry, no rashes  Psych: A&Ox3   Lab Results:  Recent Labs    05/27/20 0254 05/28/20 0827  WBC 8.4 7.5  HGB 8.4* 8.6*  HCT 27.2* 28.7*  PLT 295 302   BMET Recent Labs    05/26/20 0653 05/27/20 0254  NA 130* 131*  K 3.5 3.2*  CL 104 106  CO2 20* 21*  GLUCOSE 99 87  BUN 6* <5*  CREATININE 0.58* 0.61  CALCIUM 7.7* 7.7*   PT/INR Recent Labs    05/26/20 1454  LABPROT 14.5  INR 1.2   CMP     Component Value Date/Time   NA 131 (L) 05/27/2020 0254   K 3.2 (L) 05/27/2020 0254   CL 106 05/27/2020 0254   CO2 21 (L) 05/27/2020 0254   GLUCOSE 87 05/27/2020 0254   BUN <5 (L) 05/27/2020 0254   CREATININE 0.61 05/27/2020 0254   CALCIUM 7.7 (L) 05/27/2020 0254   PROT 5.1 (L)  05/27/2020 0254   ALBUMIN 1.9 (L) 05/27/2020 0254   AST 18 05/27/2020 0254   ALT 12 05/27/2020 0254   ALKPHOS 68 05/27/2020 0254   BILITOT 4.0 (H) 05/27/2020 0254   GFRNONAA >60 05/27/2020 0254   GFRAA >60 09/09/2019 0207   Lipase  No results found for: LIPASE     Studies/Results: MR PELVIS WO CONTRAST  Result Date: 05/28/2020 CLINICAL DATA:  65 year old with rectal mass discovered on colonoscopy. EXAM: MRI PELVIS WITHOUT CONTRAST TECHNIQUE: Multiplanar multisequence MR imaging of the pelvis was performed. No intravenous contrast was administered. Ultrasound gel was administered per rectum to optimize tumor evaluation. COMPARISON:  September 19, 2019 FINDINGS: TUMOR LOCATION Tumor distance from Anal Verge/Skin Surface:  16 cm Tumor distance to Internal Anal Sphincter: 10.4 cm TUMOR DESCRIPTION Circumferential Extent: Eccentric favoring the LEFT lateral rectosigmoid colon. Tumor Length: 4 cm cm T - CATEGORY Extension through Muscularis Propria: Yes T3 B with LEFT lateral extension of tumor that appears to extend 2-3 mm beyondw the wall of the colon/rectum. (Image 22, series 16) Shortest Distance of any tumor/node from Mesorectal Fascia: Tumor arises at or near the APR. Perhaps better classified as a sigmoid colon cancer rather than a rectal cancer based on position, the APR is faintly seen on  image 7 of series 10. Tumor is just above the anterior peritoneal reflection. Extramural Vascular Invasion/Tumor Thrombus: No Invasion of Anterior Peritoneal Reflection: No Involvement of Adjacent Organs or Pelvic Sidewall: No Levator Ani Involvement: No N - CATEGORY Mesorectal Lymph Nodes >=45mm: None Extra-mesorectal Lymphadenopathy: None visualized on limited pelvic coverage. Other: Heterogeneous marrow signal likely relates to reported chronic anemia. IMPRESSION: Findings of T3 B, N0, Mx colorectal cancer. Though tumor is more compatible with a sigmoid rather than rectal cancer based on position of tumor as  compared to the anterior peritoneal reflection. Note that the exam shows limited assessment with respect to anatomic boundaries due to motion artifact. Electronically Signed   By: Zetta Bills M.D.   On: 05/28/2020 08:41   ECHOCARDIOGRAM COMPLETE  Result Date: 05/27/2020    ECHOCARDIOGRAM REPORT   Patient Name:   Marc Mcdonald Date of Exam: 05/27/2020 Medical Rec #:  [id number]       Height:       71.0 in Accession #:    [id number]      Weight:       134.7 lb Date of Birth:  02-20-1956        BSA:          1.783 m Patient Age:    65 years        BP:           129/82 mmHg Patient Gender: M               HR:           104 bpm. Exam Location:  Inpatient Procedure: 2D Echo, Cardiac Doppler and Color Doppler Indications:    R06.02 SOB  History:        Patient has no prior history of Echocardiogram examinations.                 Tumor in colon.  Sonographer:    Merrie Roof RDCS Referring Phys: [id number] [place]  1. Left ventricular ejection fraction, by estimation, is 65 to 70%. The left ventricle has normal function. Left ventricular endocardial border not optimally defined to evaluate regional wall motion. Left ventricular diastolic parameters are consistent with Grade I diastolic dysfunction (impaired relaxation).  2. Right ventricular systolic function is hyperdynamic. The right ventricular size is normal.  3. The mitral valve is grossly normal. No evidence of mitral valve regurgitation.  4. The aortic valve is grossly normal. Aortic valve regurgitation is not visualized.  5. The inferior vena cava is dilated in size with >50% respiratory variability, suggesting right atrial pressure of 8 mmHg. Comparison(s): No prior Echocardiogram. Conclusion(s)/Recommendation(s): Technically difficult study. FINDINGS  Left Ventricle: Left ventricular ejection fraction, by estimation, is 65 to 70%. The left ventricle has normal function. Left ventricular endocardial border not optimally defined to evaluate regional  wall motion. The left ventricular internal cavity size was normal in size. There is no left ventricular hypertrophy. Left ventricular diastolic parameters are consistent with Grade I diastolic dysfunction (impaired relaxation). Right Ventricle: The right ventricular size is normal. Right vetricular wall thickness was not well visualized. Right ventricular systolic function is hyperdynamic. Left Atrium: Left atrial size was normal in size. Right Atrium: Right atrial size was normal in size. Pericardium: Trivial pericardial effusion is present. Mitral Valve: The mitral valve is grossly normal. No evidence of mitral valve regurgitation. Tricuspid Valve: The tricuspid valve is not well visualized. Tricuspid valve regurgitation is not demonstrated. No evidence of tricuspid stenosis. Aortic Valve: The  aortic valve is grossly normal. Aortic valve regurgitation is not visualized. Aortic valve mean gradient measures 3.0 mmHg. Aortic valve peak gradient measures 5.3 mmHg. Aortic valve area, by VTI measures 3.09 cm. Pulmonic Valve: The pulmonic valve was not well visualized. Pulmonic valve regurgitation is not visualized. No evidence of pulmonic stenosis. Aorta: The aortic root is normal in size and structure. Venous: The inferior vena cava is dilated in size with greater than 50% respiratory variability, suggesting right atrial pressure of 8 mmHg. IAS/Shunts: The interatrial septum was not well visualized.  LEFT VENTRICLE PLAX 2D LVIDd:         4.00 cm  Diastology LVIDs:         2.80 cm  LV e' medial:    7.94 cm/s LV PW:         0.90 cm  LV E/e' medial:  10.6 LV IVS:        1.00 cm  LV e' lateral:   9.14 cm/s LVOT diam:     2.00 cm  LV E/e' lateral: 9.2 LV SV:         57 LV SV Index:   32 LVOT Area:     3.14 cm  RIGHT VENTRICLE          IVC RV Basal diam:  2.80 cm  IVC diam: 2.10 cm LEFT ATRIUM           Index      RIGHT ATRIUM           Index LA diam:      2.90 cm 1.63 cm/m RA Area:     16.00 cm LA Vol (A4C): 16.5 ml  9.26 ml/m RA Volume:   39.20 ml  21.99 ml/m  AORTIC VALVE AV Area (Vmax):    3.47 cm AV Area (Vmean):   3.12 cm AV Area (VTI):     3.09 cm AV Vmax:           115.00 cm/s AV Vmean:          82.900 cm/s AV VTI:            0.184 m AV Peak Grad:      5.3 mmHg AV Mean Grad:      3.0 mmHg LVOT Vmax:         127.00 cm/s LVOT Vmean:        82.200 cm/s LVOT VTI:          0.181 m LVOT/AV VTI ratio: 0.98  AORTA Ao Root diam: 3.40 cm MITRAL VALVE MV Area (PHT): 3.89 cm    SHUNTS MV Decel Time: 195 msec    Systemic VTI:  0.18 m MV E velocity: 84.20 cm/s  Systemic Diam: 2.00 cm MV A velocity: 89.70 cm/s MV E/A ratio:  0.94 Rudean Haskell MD Electronically signed by Rudean Haskell MD Signature Date/Time: 05/27/2020/1:47:48 PM    Final     Anti-infectives: Anti-infectives (From admission, onward)   None       Assessment/Plan COPD Non-bleeding duodenal ulcer  Grade B esophogitis, gastritis, esophageal stenosis s/p dilation 4/2 Grade II internal hemorrhoids Iron deficiency anemia  Alcohol use disorder Indirect hyperbilirubinemia History of tobacco use   GIB Rectosigmoid mass Marc Mcdonald is an 65 y.o. male with a new diagnosis of recto-sigmoid cancer. He presented with symptomatic anemia that had been worsening over the last 5 weeks. Overall he appears deconditioned due to weight loss, recent loss of appetite, and fatigue.  - CT chest/abd/pelvis 05/25/2020 w/ out evidence  of metastasis - colonoscopy 05/26/2020 w/ malignant partially obstructing tumor in the proximal rectum, pathology pending - CEA 5.6  - MRI pelvis 4/3 read as T3 B, N0, Mx colorectal cancer  - patient would benefit from partial colectomy/colostomy this admission. Will discuss bowel prep and timing of surgery with attending MD.  - will have [facility] RN mark patient for colostomy.  FEN: SOFT  ID: None VTE: SCD's, chemical VTE held in the setting of sxs anemia/GIB Foley: none Dispo: follow surgical path, surgical planning for  partial colectomy/colostomy     LOS: 3 days    Obie Dredge, [facility] Surgery Please see Amion for pager number during day hours 7:00am-4:30pm

## 2020-05-28 NOTE — Progress Notes (Addendum)
Marc Mcdonald   DOB:1955-06-08   VF#:[id number]   ACZ#:[id number]  Hematology and oncology follow-up  Subjective: Patient is clinically stable, denies pain or other new symptoms.  Daughter-in-law at the bedside.  General surgery notes reviewed and they recommend laparoscopic/robotic sigmoidectomy possible low anterior resection, possible flex sigmoidoscopy and end colostomy.  Family tells me that he is unsure if he wants to proceed.  Daughter-in-law does not think that he completely understands the procedure and need for possible colostomy.  Objective:  Vitals:   05/28/20 0447 05/28/20 0817  BP: 109/82 114/78  Pulse: 100 91  Resp: (!) 25 20  Temp: 97.7 F (36.5 C) 98.3 F (36.8 C)  SpO2: 100% 98%    Body mass index is 17.36 kg/m.  Intake/Output Summary (Last 24 hours) at 05/28/2020 1247 Last data filed at 05/28/2020 1100 Gross per 24 hour  Intake 680 ml  Output 2500 ml  Net -1820 ml     He appears to be mildly jaundiced  No leg edema   CBG (last 3)  No results for input(s): GLUCAP in the last 72 hours.   Labs:  Urine Studies No results for input(s): UHGB, CRYS in the last 72 hours.  Invalid input(s): UACOL, UAPR, USPG, UPH, UTP, UGL, UKET, UBIL, UNIT, UROB, [place], UEPI, UWBC, Junie Panning [place], [place], [place]  Basic Metabolic Panel: Recent Labs  Lab 05/25/20 1104 05/25/20 1305 05/26/20 0653 05/27/20 0254 05/28/20 0827  NA 125* 127* 130* 131* 134*  K 3.8 3.9 3.5 3.2* 3.9  CL 95*  --  104 106 103  CO2 16*  --  20* 21* 22  GLUCOSE 135*  --  99 87 73  BUN 5*  --  6* <5* <5*  CREATININE 0.70  --  0.58* 0.61 0.57*  CALCIUM 8.1*  --  7.7* 7.7* 8.2*  MG  --   --   --  1.8  --    GFR Estimated Creatinine Clearance: 73.6 mL/min (A) (by C-G formula based on SCr of 0.57 mg/dL (L)). Liver Function Tests: Recent Labs  Lab 05/25/20 1104 05/26/20 0653 05/27/20 0254 05/28/20 0827  AST 17 23 18 16   ALT 13 14 12 13   ALKPHOS 98 83 68 67  BILITOT 2.1* 7.0* 4.0* 2.2*  PROT 6.7  5.6* 5.1* 5.6*  ALBUMIN 2.4* 2.0* 1.9* 2.0*   No results for input(s): LIPASE, AMYLASE in the last 168 hours. No results for input(s): AMMONIA in the last 168 hours. Coagulation profile Recent Labs  Lab 05/26/20 1454  INR 1.2    CBC: Recent Labs  Lab 05/25/20 1104 05/25/20 1305 05/26/20 0653 05/26/20 1406 05/26/20 1454 05/27/20 0254 05/28/20 0827  WBC 11.7*  --  5.7  --   --  8.4 7.5  NEUTROABS 8.7*  --   --   --   --   --   --   HGB 5.5*   < > 6.9* 8.7* 8.4* 8.4* 8.6*  HCT 21.3*   < > 23.2* 28.0* 27.1* 27.2* 28.7*  MCV 77.2*  --  77.3*  --   --  81.2 82.2  PLT 424*  --  309  --  269 295 302   < > = values in this interval not displayed.   Cardiac Enzymes: No results for input(s): CKTOTAL, CKMB, CKMBINDEX, TROPONINI in the last 168 hours. BNP: Invalid input(s): POCBNP CBG: No results for input(s): GLUCAP in the last 168 hours. D-Dimer Recent Labs    05/26/20 1454  DDIMER <0.27  Hgb A1c No results for input(s): HGBA1C in the last 72 hours. Lipid Profile No results for input(s): CHOL, HDL, LDLCALC, TRIG, CHOLHDL, LDLDIRECT in the last 72 hours. Thyroid function studies No results for input(s): TSH, T4TOTAL, T3FREE, THYROIDAB in the last 72 hours.  Invalid input(s): FREET3 Anemia work up Recent Labs    05/26/20 1406 05/26/20 1459  VITAMINB12  --  516  FOLATE  --  8.8  FERRITIN  --  10*  TIBC  --  286  IRON  --  247*  RETICCTPCT 2.7  --    Microbiology Recent Results (from the past 240 hour(s))  SARS CORONAVIRUS 2 (TAT 6-24 HRS) Nasopharyngeal Nasopharyngeal Swab     Status: None   Collection Time: 05/25/20  9:01 PM   Specimen: Nasopharyngeal Swab  Result Value Ref Range Status   SARS Coronavirus 2 NEGATIVE NEGATIVE Final    Comment: (NOTE) SARS-CoV-2 target nucleic acids are NOT DETECTED.  The SARS-CoV-2 RNA is generally detectable in upper and lower respiratory specimens during the acute phase of infection. Negative results do not preclude  SARS-CoV-2 infection, do not rule out co-infections with other pathogens, and should not be used as the sole basis for treatment or other patient management decisions. Negative results must be combined with clinical observations, patient history, and epidemiological information. The expected result is Negative.  Fact Sheet for Patients: SugarRoll.be  Fact Sheet for Healthcare Providers: https://www.woods-mathews.com/  This test is not yet approved or cleared by the [place] FDA and  has been authorized for detection and/or diagnosis of SARS-CoV-2 by FDA under an Emergency Use Authorization (EUA). This EUA will remain  in effect (meaning this test can be used) for the duration of the COVID-19 declaration under Se ction 564(b)(1) of the Act, 21 U.S.C. section 360bbb-3(b)(1), unless the authorization is terminated or revoked sooner.  Performed at [facility], [place] [address], [place], [place] [address]       Studies:  MR PELVIS WO CONTRAST  Result Date: 05/28/2020 CLINICAL DATA:  65 year old with rectal mass discovered on colonoscopy. EXAM: MRI PELVIS WITHOUT CONTRAST TECHNIQUE: Multiplanar multisequence MR imaging of the pelvis was performed. No intravenous contrast was administered. Ultrasound gel was administered per rectum to optimize tumor evaluation. COMPARISON:  September 19, 2019 FINDINGS: TUMOR LOCATION Tumor distance from Anal Verge/Skin Surface:  16 cm Tumor distance to Internal Anal Sphincter: 10.4 cm TUMOR DESCRIPTION Circumferential Extent: Eccentric favoring the LEFT lateral rectosigmoid colon. Tumor Length: 4 cm cm T - CATEGORY Extension through Muscularis Propria: Yes T3 B with LEFT lateral extension of tumor that appears to extend 2-3 mm beyondw the wall of the colon/rectum. (Image 22, series 16) Shortest Distance of any tumor/node from Mesorectal Fascia: Tumor arises at or near the APR. Perhaps better classified as a sigmoid  colon cancer rather than a rectal cancer based on position, the APR is faintly seen on image 7 of series 10. Tumor is just above the anterior peritoneal reflection. Extramural Vascular Invasion/Tumor Thrombus: No Invasion of Anterior Peritoneal Reflection: No Involvement of Adjacent Organs or Pelvic Sidewall: No Levator Ani Involvement: No N - CATEGORY Mesorectal Lymph Nodes >=24mm: None Extra-mesorectal Lymphadenopathy: None visualized on limited pelvic coverage. Other: Heterogeneous marrow signal likely relates to reported chronic anemia. IMPRESSION: Findings of T3 B, N0, Mx colorectal cancer. Though tumor is more compatible with a sigmoid rather than rectal cancer based on position of tumor as compared to the anterior peritoneal reflection. Note that the exam shows limited assessment with  respect to anatomic boundaries due to motion artifact. Electronically Signed   By: Zetta Bills M.D.   On: 05/28/2020 08:41   ECHOCARDIOGRAM COMPLETE  Result Date: 05/27/2020    ECHOCARDIOGRAM REPORT   Patient Name:   GEO SLONE Date of Exam: 05/27/2020 Medical Rec #:  [id number]       Height:       71.0 in Accession #:    [id number]      Weight:       134.7 lb Date of Birth:  23-Mar-1955        BSA:          1.783 m Patient Age:    38 years        BP:           129/82 mmHg Patient Gender: M               HR:           104 bpm. Exam Location:  Inpatient Procedure: 2D Echo, Cardiac Doppler and Color Doppler Indications:    R06.02 SOB  History:        Patient has no prior history of Echocardiogram examinations.                 Tumor in colon.  Sonographer:    Merrie Roof RDCS Referring Phys: [id number] [place]  1. Left ventricular ejection fraction, by estimation, is 65 to 70%. The left ventricle has normal function. Left ventricular endocardial border not optimally defined to evaluate regional wall motion. Left ventricular diastolic parameters are consistent with Grade I diastolic dysfunction (impaired  relaxation).  2. Right ventricular systolic function is hyperdynamic. The right ventricular size is normal.  3. The mitral valve is grossly normal. No evidence of mitral valve regurgitation.  4. The aortic valve is grossly normal. Aortic valve regurgitation is not visualized.  5. The inferior vena cava is dilated in size with >50% respiratory variability, suggesting right atrial pressure of 8 mmHg. Comparison(s): No prior Echocardiogram. Conclusion(s)/Recommendation(s): Technically difficult study. FINDINGS  Left Ventricle: Left ventricular ejection fraction, by estimation, is 65 to 70%. The left ventricle has normal function. Left ventricular endocardial border not optimally defined to evaluate regional wall motion. The left ventricular internal cavity size was normal in size. There is no left ventricular hypertrophy. Left ventricular diastolic parameters are consistent with Grade I diastolic dysfunction (impaired relaxation). Right Ventricle: The right ventricular size is normal. Right vetricular wall thickness was not well visualized. Right ventricular systolic function is hyperdynamic. Left Atrium: Left atrial size was normal in size. Right Atrium: Right atrial size was normal in size. Pericardium: Trivial pericardial effusion is present. Mitral Valve: The mitral valve is grossly normal. No evidence of mitral valve regurgitation. Tricuspid Valve: The tricuspid valve is not well visualized. Tricuspid valve regurgitation is not demonstrated. No evidence of tricuspid stenosis. Aortic Valve: The aortic valve is grossly normal. Aortic valve regurgitation is not visualized. Aortic valve mean gradient measures 3.0 mmHg. Aortic valve peak gradient measures 5.3 mmHg. Aortic valve area, by VTI measures 3.09 cm. Pulmonic Valve: The pulmonic valve was not well visualized. Pulmonic valve regurgitation is not visualized. No evidence of pulmonic stenosis. Aorta: The aortic root is normal in size and structure. Venous: The  inferior vena cava is dilated in size with greater than 50% respiratory variability, suggesting right atrial pressure of 8 mmHg. IAS/Shunts: The interatrial septum was not well visualized.  LEFT VENTRICLE PLAX 2D LVIDd:  4.00 cm  Diastology LVIDs:         2.80 cm  LV e' medial:    7.94 cm/s LV PW:         0.90 cm  LV E/e' medial:  10.6 LV IVS:        1.00 cm  LV e' lateral:   9.14 cm/s LVOT diam:     2.00 cm  LV E/e' lateral: 9.2 LV SV:         57 LV SV Index:   32 LVOT Area:     3.14 cm  RIGHT VENTRICLE          IVC RV Basal diam:  2.80 cm  IVC diam: 2.10 cm LEFT ATRIUM           Index      RIGHT ATRIUM           Index LA diam:      2.90 cm 1.63 cm/m RA Area:     16.00 cm LA Vol (A4C): 16.5 ml 9.26 ml/m RA Volume:   39.20 ml  21.99 ml/m  AORTIC VALVE AV Area (Vmax):    3.47 cm AV Area (Vmean):   3.12 cm AV Area (VTI):     3.09 cm AV Vmax:           115.00 cm/s AV Vmean:          82.900 cm/s AV VTI:            0.184 m AV Peak Grad:      5.3 mmHg AV Mean Grad:      3.0 mmHg LVOT Vmax:         127.00 cm/s LVOT Vmean:        82.200 cm/s LVOT VTI:          0.181 m LVOT/AV VTI ratio: 0.98  AORTA Ao Root diam: 3.40 cm MITRAL VALVE MV Area (PHT): 3.89 cm    SHUNTS MV Decel Time: 195 msec    Systemic VTI:  0.18 m MV E velocity: 84.20 cm/s  Systemic Diam: 2.00 cm MV A velocity: 89.70 cm/s MV E/A ratio:  0.94 Rudean Haskell MD Electronically signed by Rudean Haskell MD Signature Date/Time: 05/27/2020/1:47:48 PM    Final     Assessment: 65 y.o. male with past medical history of COPD, heavy smoking and alcohol drinking, presented with symptomatic anemia  1.  Sigmoid tumor, final pathology pending 2.  Severe symptomatic anemia, from iron deficiency  3. COPD  4. Hyperbilirubinemia, with predominant indirect bilirubin, no lab evidence of hemolysis. 5.  Duodenal ulcer 6.  Alcohol abuse, history of heavy smoking.   Plan:  -General surgery note has been reviewed.  Note plan for possible OR.   We will also try to review his case at the GI tumor conference on Wednesday of this week. -Hemoglobin stable.  He received 1 dose of Feraheme and tolerated it well.  We will plan to repeat this as an outpatient. -He has no lab evidence of hemolysis, his total bilirubin slightly decreased this morning. -Daughter-in-law, Audley Hinojos, would like to hear from general surgery regarding surgical plans that she could help explain this better to the patient and family.  Her telephone number is listed on the chart. -all questions were answered   Mikey Bussing, NP 05/28/2020    Addendum  I have seen the patient, examined him. I agree with the assessment and and plan and have edited the notes.   Pt was seen by  colorectal surgeon Dr. Dema Severin today, who feels that his tumor is in the distal sigmoid colon, not in rectum, and recommended upfront surgery, which I agree.  I reviewed his pelvic MRI scan findings with patient and his daughter-in-law.  Patient had many questions about the surgery, and I communicated with Dr. Dema Severin and related some message to patient and his family.  Dr. Vikki Ports may offer the surgery this Wednesday, and will stop by to see him again tomorrow to finalize.  I discussed the role of adjuvant chemotherapy for sigmoid colon cancer, which will be determined based on his surgical pathology result.  I will follow-up with him in my office in about 3 weeks. I may see him before his hospital discharge if we have his surgical pathology result back.  Truitt Merle  05/28/2020

## 2020-05-28 NOTE — Telephone Encounter (Signed)
error 

## 2020-05-28 NOTE — Evaluation (Signed)
Physical Therapy Evaluation Patient Details Name: Marc Mcdonald MRN: [id number] DOB: 06-Mar-1955 Today's Date: 05/28/2020   History of Present Illness  Pt adm 4/1 with dizziness, weakness, and dyspnea. Pt with Hgb of 5.5 and found to have duodenal ulcer and rectal mass. Oncology and surgery involved and staging of rectal CA in process. PMH - ?copd, lt rib fx's 08/2019, etoh  Clinical Impression  Pt presents to PT with slightly unsteady gait due to illness and inactivity. Expect pt will make good progress back to baseline with mobility. Will follow acutely but doubt pt will need PT after DC. If pt undergoes surgery will need to reassess dc recommendations.      Follow Up Recommendations No PT follow up    Equipment Recommendations  None recommended by PT    Recommendations for Other Services       Precautions / Restrictions Precautions Precautions: Fall      Mobility  Bed Mobility Overal bed mobility: Modified Independent                  Transfers Overall transfer level: Needs assistance Equipment used: None Transfers: Sit to/from Stand Sit to Stand: Supervision         General transfer comment: supervision for safety  Ambulation/Gait Ambulation/Gait assistance: Min guard Gait Distance (Feet): 275 Feet Assistive device: None Gait Pattern/deviations: Step-through pattern;Decreased stride length;Drifts right/left;Wide base of support Gait velocity: decr Gait velocity interpretation: >2.62 ft/sec, indicative of community ambulatory General Gait Details: Slightly unsteady gait without overt loss of balance  Stairs            Wheelchair Mobility    Modified Rankin (Stroke Patients Only)       Balance Overall balance assessment: Needs assistance Sitting-balance support: No upper extremity supported;Feet supported Sitting balance-Leahy Scale: Normal     Standing balance support: No upper extremity supported;During functional activity Standing  balance-Leahy Scale: Good Standing balance comment: unsteady with dynamic but no overt loss of balance                             Pertinent Vitals/Pain Pain Assessment: No/denies pain    Home Living Family/patient expects to be discharged to:: Private residence Living Arrangements: Children Available Help at Discharge: Family;Available PRN/intermittently Type of Home: House Home Access: Stairs to enter Entrance Stairs-Rails: None Entrance Stairs-Number of Steps: 2 Home Layout: One level Home Equipment: None      Prior Function Level of Independence: Needs assistance   Gait / Transfers Assistance Needed: Modified independent no assistive device, History of falls (in the past pt reports these have been related to etoh use.)           Hand Dominance        Extremity/Trunk Assessment   Upper Extremity Assessment Upper Extremity Assessment: Defer to OT evaluation    Lower Extremity Assessment Lower Extremity Assessment: Generalized weakness       Communication   Communication: No difficulties  Cognition Arousal/Alertness: Awake/alert Behavior During Therapy: WFL for tasks assessed/performed Overall Cognitive Status: No family/caregiver present to determine baseline cognitive functioning                                 General Comments: Lack of insight into medical problems      General Comments General comments (skin integrity, edema, etc.): Dyspnea 2/4 with amb    Exercises  Assessment/Plan    PT Assessment Patient needs continued PT services  PT Problem List Decreased strength;Decreased activity tolerance;Decreased balance;Decreased mobility       PT Treatment Interventions DME instruction;Gait training;Functional mobility training;Therapeutic activities;Therapeutic exercise;Balance training;Patient/family education    PT Goals (Current goals can be found in the Care Plan section)  Acute Rehab PT Goals Patient Stated  Goal: go home PT Goal Formulation: With patient Time For Goal Achievement: 06/11/20 Potential to Achieve Goals: Good    Frequency Min 3X/week   Barriers to discharge Decreased caregiver support family works. Home alone at times    Co-evaluation               AM-PAC PT "6 Clicks" Mobility  Outcome Measure Help needed turning from your back to your side while in a flat bed without using bedrails?: None Help needed moving from lying on your back to sitting on the side of a flat bed without using bedrails?: None Help needed moving to and from a bed to a chair (including a wheelchair)?: A Little Help needed standing up from a chair using your arms (e.g., wheelchair or bedside chair)?: A Little Help needed to walk in hospital room?: A Little Help needed climbing 3-5 steps with a railing? : A Little 6 Click Score: 20    End of Session   Activity Tolerance: Patient tolerated treatment well Patient left: in bed;with call bell/phone within reach;with bed alarm set;Other (comment) ([place] nurse present)   PT Visit Diagnosis: Unsteadiness on feet (R26.81);Muscle weakness (generalized) (M62.81);History of falling (Z91.81)    Time: 1165-7903 PT Time Calculation (min) (ACUTE ONLY): 13 min   Charges:   PT Evaluation $PT Eval Low Complexity: 1 Low          [facility] Pager [phone] Office [place] 05/28/2020, 11:54 AM

## 2020-05-28 NOTE — Progress Notes (Addendum)
Nutrition Follow-up  DOCUMENTATION CODES:  Severe malnutrition in context of chronic illness,Underweight  INTERVENTION:  Continue diet as ordered  Recommend Ensure Enlive po TID, each supplement provides 350 kcal and 20 grams of protein  Oral multivitamin with minerals daily  NUTRITION DIAGNOSIS:  Inadequate oral intake related to poor appetite,social / environmental circumstances,altered GI function as evidenced by NPO status.  Severe protein-calorie malnutrition in the context of EtOH abuse and new cancer dx related to chronic inadequate oral intake as evidenced by severe fat and muscle depletions  GOAL:  Patient will meet greater than or equal to 90% of their needs  MONITOR:  Diet advancement,PO intake,Labs,Weight trends  REASON FOR ASSESSMENT:  Consult Assessment of nutrition requirement/status  ASSESSMENT:  65 yo male admitted with symptomatic anemia, dyspnea on exertion, hyponatremia. PMH includes COPD, EtOH abuse (12-15 beers per day) and tobacco abuse (1 pack every other day)  Medications reviewed and include: iron polysaccharides, senna, thiamine 100mg  daily. Labs reviewed: Na 131, K 3.2  Pt resting in bed at the time of visit. Sleeping but was able to be roused. Underwent EGD/Colonoscopy 4/2 that showed hemorrhoids, a malignant, partially obstructing tumor in the proximal rectum, benign-appearing esophageal stenosis, reflux esophagitis, a hiatal hernia, non bleeding duodenal ulcer, and erythematous duodenopathy. Oncology and surgery now consulting while tumor is being staged to determine treatment moving forward. Pt reports he has been eating fine since admission. Noted 100% intake x 4 recorded meals this admission. Reports that at home he only consumes 1 meal per day. Pt vague about types of food consumed or further questioning into nutrition hx. Would benefit from supplements (prefers chocolate) and a daily MVI.  NUTRITION - FOCUSED PHYSICAL EXAM: Flowsheet Row Most  Recent Value  Orbital Region Severe depletion  Upper Arm Region Severe depletion  Thoracic and Lumbar Region Severe depletion  Buccal Region Severe depletion  Temple Region Moderate depletion  Clavicle Bone Region Moderate depletion  Clavicle and Acromion Bone Region Severe depletion  Scapular Bone Region Severe depletion  Dorsal Hand Moderate depletion  Patellar Region Severe depletion  Anterior Thigh Region Severe depletion  Posterior Calf Region Severe depletion  Edema (RD Assessment) None  Hair Reviewed  Eyes Reviewed  Mouth Reviewed  Skin Reviewed  Nails Reviewed     Diet Order:   Diet Order            Diet regular Room service appropriate? Yes; Fluid consistency: Thin  Diet effective now                EDUCATION NEEDS:  Not appropriate for education at this time  Skin:  Skin Assessment: Reviewed RN Assessment  Last BM:  4/3 per pt - prior to procedures  Height:  Ht Readings from Last 1 Encounters:  05/26/20 5\' 11"  (1.803 m)   Weight:  Wt Readings from Last 5 Encounters:  05/28/20 56.5 kg  05/25/20 57.2 kg  10/12/19 60.7 kg  09/14/19 59.4 kg  09/08/19 63.5 kg   Ideal Body Weight:  78.2 kg  BMI:  Body mass index is 17.36 kg/m.  Estimated Nutritional Needs:   Kcal:  2000-2200 kcals  Protein:  100-115 g  Fluid:  >/= 2 L  Ranell Patrick, RD, LDN Clinical Dietitian Pager on [place]

## 2020-05-29 LAB — COMPREHENSIVE METABOLIC PANEL
ALT: 11 U/L (ref 0–44)
AST: 13 U/L — ABNORMAL LOW (ref 15–41)
Albumin: 1.9 g/dL — ABNORMAL LOW (ref 3.5–5.0)
Alkaline Phosphatase: 65 U/L (ref 38–126)
Anion gap: 5 (ref 5–15)
BUN: <5 mg/dL — ABNORMAL LOW (ref 8–23)
CO2: 27 mmol/L (ref 22–32)
Calcium: 7.9 mg/dL — ABNORMAL LOW (ref 8.9–10.3)
Chloride: 99 mmol/L (ref 98–111)
Creatinine, Ser: 0.54 mg/dL — ABNORMAL LOW (ref 0.61–1.24)
GFR, Estimated: >60 mL/min (ref >60)
Glucose, Bld: 94 mg/dL (ref 70–99)
Potassium: 4 mmol/L (ref 3.5–5.1)
Sodium: 131 mmol/L — ABNORMAL LOW (ref 135–145)
Total Bilirubin: 1.1 mg/dL (ref 0.3–1.2)
Total Protein: 5 g/dL — ABNORMAL LOW (ref 6.5–8.1)

## 2020-05-29 LAB — CBC
HCT: 27.7 % — ABNORMAL LOW (ref 39.0–52.0)
Hemoglobin: 8.4 g/dL — ABNORMAL LOW (ref 13.0–17.0)
MCH: 25 pg — ABNORMAL LOW (ref 26.0–34.0)
MCHC: 30.3 g/dL (ref 30.0–36.0)
MCV: 82.4 fL (ref 80.0–100.0)
Platelets: 262 10*3/uL (ref 150–400)
RBC: 3.36 MIL/uL — ABNORMAL LOW (ref 4.22–5.81)
RDW: 22.7 % — ABNORMAL HIGH (ref 11.5–15.5)
WBC: 8.6 10*3/uL (ref 4.0–10.5)
nRBC: 0 % (ref 0.0–0.2)

## 2020-05-29 LAB — SURGICAL PCR SCREEN
MRSA, PCR: NEGATIVE
Staphylococcus aureus: POSITIVE — AB

## 2020-05-29 LAB — HEMOGLOBIN A1C
Hgb A1c MFr Bld: 5 % (ref 4.8–5.6)
Mean Plasma Glucose: 96.8 mg/dL

## 2020-05-29 MED ORDER — CHLORHEXIDINE GLUCONATE CLOTH 2 % EX PADS: 6.0000 | MEDICATED_PAD | Freq: Once | CUTANEOUS | Status: DC

## 2020-05-29 MED ORDER — MUPIROCIN 2 % EX OINT
1.0000 "application " | TOPICAL_OINTMENT | Freq: Two times a day (BID) | CUTANEOUS | Status: AC
  Administered 2020-05-31 – 2020-06-03 (×7): 1 via NASAL
  Filled 2020-05-29 (×2): qty 22

## 2020-05-29 MED ORDER — NEOMYCIN SULFATE 500 MG PO TABS
1000.0000 mg | ORAL_TABLET | ORAL | Status: AC
  Administered 2020-05-29 (×3): 1000 mg via ORAL
  Filled 2020-05-29 (×3): qty 2

## 2020-05-29 MED ORDER — MUPIROCIN 2 % EX OINT
1.0000 "application " | TOPICAL_OINTMENT | Freq: Two times a day (BID) | CUTANEOUS | Status: DC
  Administered 2020-05-29 – 2020-05-31 (×4): 1 via NASAL

## 2020-05-29 MED ORDER — SODIUM CHLORIDE 0.9 % IV SOLN
2.0000 g | INTRAVENOUS | Status: AC
  Administered 2020-05-30: 2 g via INTRAVENOUS
  Filled 2020-05-29 (×2): qty 2

## 2020-05-29 MED ORDER — CHLORHEXIDINE GLUCONATE CLOTH 2 % EX PADS
6.0000 | MEDICATED_PAD | Freq: Every day | CUTANEOUS | Status: AC
  Administered 2020-05-29 – 2020-06-02 (×5): 6 via TOPICAL

## 2020-05-29 MED ORDER — BUPIVACAINE LIPOSOME 1.3 % IJ SUSP
20.0000 mL | Freq: Once | INTRAMUSCULAR | Status: DC
  Filled 2020-05-29: qty 20

## 2020-05-29 MED ORDER — ACETAMINOPHEN 500 MG PO TABS
1000.0000 mg | ORAL_TABLET | ORAL | Status: AC
  Administered 2020-05-30: 1000 mg via ORAL
  Filled 2020-05-29: qty 2

## 2020-05-29 MED ORDER — POLYETHYLENE GLYCOL 3350 17 GM/SCOOP PO POWD
1.0000 | Freq: Once | ORAL | Status: AC
  Administered 2020-05-29: 255 g via ORAL
  Filled 2020-05-29: qty 255

## 2020-05-29 MED ORDER — CHLORHEXIDINE GLUCONATE CLOTH 2 % EX PADS: 6.0000 | MEDICATED_PAD | Freq: Once | CUTANEOUS | Status: AC

## 2020-05-29 MED ORDER — METRONIDAZOLE 500 MG PO TABS
1000.0000 mg | ORAL_TABLET | ORAL | Status: AC
  Administered 2020-05-29 (×3): 1000 mg via ORAL
  Filled 2020-05-29 (×3): qty 2

## 2020-05-29 NOTE — Progress Notes (Signed)
HD#4 Subjective:  Overnight Events: none  Patient is seen at bedside.  He appears comfortable and in no acute distress.  He understands that he will undergo a colectomy with a colostomy.  He is concerned about changing the colostomy back.  We explained to him that the nursing team here will have instructions for him and family.  He denies any complaint.  Objective:  Vital signs in last 24 hours: Vitals:   05/28/20 1515 05/28/20 2022 05/28/20 2315 05/29/20 0528  BP: 114/73 116/73 115/74 115/77  Pulse: 89 89 99 (!) 105  Resp: (!) 23 20 (!) 23 (!) 25  Temp:  98 F (36.7 C) 98.3 F (36.8 C) 100.3 F (37.9 C)  TempSrc:  Axillary Oral Oral  SpO2: 98% 98% 98% 97%  Weight:      Height:       Supplemental O2: Room Air SpO2: 97 %   Physical Exam:  Physical Exam Constitutional:      General: He is not in acute distress. HENT:     Head: Normocephalic.  Eyes:     General:        Right eye: No discharge.        Left eye: No discharge.  Cardiovascular:     Rate and Rhythm: Normal rate and regular rhythm.  Pulmonary:     Effort: Pulmonary effort is normal. No respiratory distress.  Abdominal:     General: Bowel sounds are normal. There is no distension.  Skin:    General: Skin is warm.  Neurological:     Mental Status: He is alert.  Psychiatric:        Mood and Affect: Mood normal.     Filed Weights   05/26/20 0945 05/27/20 0645 05/28/20 0447  Weight: 56.8 kg 61.1 kg 56.5 kg     Intake/Output Summary (Last 24 hours) at 05/29/2020 0649 Last data filed at 05/29/2020 0533 Gross per 24 hour  Intake 240 ml  Output 1600 ml  Net -1360 ml   Net IO Since Admission: -2,495 mL [05/29/20 0649]  Pertinent Labs: CBC Latest Ref Rng & Units 05/29/2020 05/28/2020 05/27/2020  WBC 4.0 - 10.5 K/uL 8.6 7.5 8.4  Hemoglobin 13.0 - 17.0 g/dL 8.4(L) 8.6(L) 8.4(L)  Hematocrit 39.0 - 52.0 % 27.7(L) 28.7(L) 27.2(L)  Platelets 150 - 400 K/uL 262 302 295    CMP Latest Ref Rng & Units 05/29/2020  05/28/2020 05/27/2020  Glucose 70 - 99 mg/dL 94 73 87  BUN 8 - 23 mg/dL <5(L) <5(L) <5(L)  Creatinine 0.61 - 1.24 mg/dL 0.54(L) 0.57(L) 0.61  Sodium 135 - 145 mmol/L 131(L) 134(L) 131(L)  Potassium 3.5 - 5.1 mmol/L 4.0 3.9 3.2(L)  Chloride 98 - 111 mmol/L 99 103 106  CO2 22 - 32 mmol/L 27 22 21(L)  Calcium 8.9 - 10.3 mg/dL 7.9(L) 8.2(L) 7.7(L)  Total Protein 6.5 - 8.1 g/dL 5.0(L) 5.6(L) 5.1(L)  Total Bilirubin 0.3 - 1.2 mg/dL 1.1 2.2(H) 4.0(H)  Alkaline Phos 38 - 126 U/L 65 67 68  AST 15 - 41 U/L 13(L) 16 18  ALT 0 - 44 U/L 11 13 12     Imaging: No results found.  Assessment/Plan:   Active Problems:   Symptomatic anemia   Iron deficiency anemia due to chronic blood loss   Duodenal ulcer   Gastritis and gastroduodenitis   Gastroesophageal reflux disease with esophagitis without hemorrhage   Esophageal stricture   Rectal mass   Protein-calorie malnutrition, severe   Patient Summary: Patient is  65 yo gentlemanwith past medical history of questionable COPD,past left5,6,8ribs fracture who presented to the ED for dizziness, dyspnea with exertion,likely due to symptomatic anemia, found to have duodenal ulcer and a colorectal mass.    Colorectal mass(T3B, N0, Mx) Pelvis MRI was negative for metastatic disease.  Surgery will proceed with a Hartman's type procedure: Laparoscopic/robotic sigmoidectomy with colostomy.  Plan surgery tomorrow. Clear liquid diet today and NPO after 4 AM. - f/u tissue pathology - Follow-up with oncology outpatient to decide adjuvant chemotherapy based on surgical pathology results.   Non-bleeding duodenal ulcer Grade B esophagitis without bleeding, gastritis s/p bx, moderate esophageal stenosis s/p dilation Likely in the context of excessive NSAID use, heavy alcohol use and smoking - continue sucrafate - continue protonix 40mg  po BID x10w, then reduce to 40mg  daily - f/u gastric pathology   Iron deficiencyanemia2/2 GI bleed. Patient  received 1 dose of Feraheme.  Oral iron may be considered on discharge.  Hemoglobin stable today. - Hemolytic process work-up unremarkable.  Total bili normal today.  - Daily CBC. Transfusion goal <7   Mild Hyponatremia-improving  Likely due to increased T bili.  Serum osmolarity of 280, consistent with normal tonic hyponatremia.  Sodium level improving. - Daily BMP   Alcoholuse disorder Does not appear to be in active withdrawal. - CIWA w Ativan.    New lung densitieswhich are questioned to be post inflammatory vs atelectasis.   Diet: clear diet VTE: SCDs Code: Full Dispo:Pending partial colectomy/colostomy  Gaylan Gerold, DO 05/29/2020, 6:49 AM Pager: [phone]  Please contact the on call pager after 5 pm and on weekends at [phone].

## 2020-05-29 NOTE — Progress Notes (Signed)
Pt refused standing weight this a.m. and would not comply in removing excess blankets in order to obtain a bed weight. Pt educated on daily routine of obtaining weights. Will continue to monitor.   Elaina Hoops, RN

## 2020-05-29 NOTE — Progress Notes (Signed)
Bowel regimen started at 1445 hrs after order received. Patient has been able to drink about half the gatorade with miralax so far this shift.  He has had 2 large liquid stools.  Patient states he is unable to drink any more at this time, but will continue to try to drink this evening. Explained importance of prep for procedure and patient states he will continue to try to drink prep. Will notify night shift RN to monitor patient's progress.

## 2020-05-29 NOTE — Progress Notes (Signed)
   05/29/20 0528  Assess: MEWS Score  Temp 100.3 F (37.9 C)  BP 115/77  Pulse Rate (!) 105  ECG Heart Rate (!) 106  Resp (!) 25  SpO2 97 %  O2 Device Room Air  Assess: MEWS Score  MEWS Temp 0  MEWS Systolic 0  MEWS Pulse 1  MEWS RR 1  MEWS LOC 0  MEWS Score 2  MEWS Score Color Yellow  Assess: if the MEWS score is Yellow or Red  Were vital signs taken at a resting state? Yes  Focused Assessment Change from prior assessment (see assessment flowsheet)  Early Detection of Sepsis Score *See Row Information* Low  MEWS guidelines implemented *See Row Information* Yes  Treat  MEWS Interventions Administered prn meds/treatments (Nebulizer given)  Take Vital Signs  Increase Vital Sign Frequency  Yellow: Q 2hr X 2 then Q 4hr X 2, if remains yellow, continue Q 4hrs  Escalate  MEWS: Escalate Yellow: discuss with charge nurse/RN and consider discussing with provider and RRT  Notify: Charge Nurse/RN  Name of Charge Nurse/RN Notified Jill, RN  Date Charge Nurse/RN Notified 05/29/20  Time Charge Nurse/RN Notified 0540  Document  Patient Outcome Stabilized after interventions  Progress note created (see row info) Yes

## 2020-05-30 ENCOUNTER — Encounter (HOSPITAL_COMMUNITY)
Admission: EM | Disposition: A | Discharge status: Home Health | Payer: Self-pay | Source: Home / Self Care | Attending: Internal Medicine

## 2020-05-30 ENCOUNTER — Inpatient Hospital Stay (HOSPITAL_COMMUNITY): Payer: Medicare Other | Admitting: Certified Registered"

## 2020-05-30 ENCOUNTER — Other Ambulatory Visit: Payer: Self-pay

## 2020-05-30 ENCOUNTER — Encounter (HOSPITAL_COMMUNITY): Payer: Self-pay | Admitting: Internal Medicine

## 2020-05-30 DIAGNOSIS — E43 Unspecified severe protein-calorie malnutrition: Secondary | ICD-10-CM | POA: Diagnosis not present

## 2020-05-30 DIAGNOSIS — E871 Hypo-osmolality and hyponatremia: Secondary | ICD-10-CM | POA: Diagnosis not present

## 2020-05-30 DIAGNOSIS — C187 Malignant neoplasm of sigmoid colon: Secondary | ICD-10-CM | POA: Diagnosis not present

## 2020-05-30 DIAGNOSIS — K2210 Ulcer of esophagus without bleeding: Secondary | ICD-10-CM | POA: Diagnosis not present

## 2020-05-30 HISTORY — PX: XI ROBOTIC ASSISTED LOWER ANTERIOR RESECTION: SHX6558

## 2020-05-30 HISTORY — PX: COLOSTOMY: SHX63

## 2020-05-30 LAB — CBC
HCT: 31.7 % — ABNORMAL LOW (ref 39.0–52.0)
Hemoglobin: 9.9 g/dL — ABNORMAL LOW (ref 13.0–17.0)
MCH: 25.6 pg — ABNORMAL LOW (ref 26.0–34.0)
MCHC: 31.2 g/dL (ref 30.0–36.0)
MCV: 81.9 fL (ref 80.0–100.0)
Platelets: 275 10*3/uL (ref 150–400)
RBC: 3.87 MIL/uL — ABNORMAL LOW (ref 4.22–5.81)
RDW: 24.5 % — ABNORMAL HIGH (ref 11.5–15.5)
WBC: 6.3 10*3/uL (ref 4.0–10.5)
nRBC: 0 % (ref 0.0–0.2)

## 2020-05-30 LAB — SURGICAL PATHOLOGY

## 2020-05-30 LAB — COMPREHENSIVE METABOLIC PANEL
ALT: 13 U/L (ref 0–44)
AST: 14 U/L — ABNORMAL LOW (ref 15–41)
Albumin: 2.1 g/dL — ABNORMAL LOW (ref 3.5–5.0)
Alkaline Phosphatase: 71 U/L (ref 38–126)
Anion gap: 8 (ref 5–15)
BUN: <5 mg/dL — ABNORMAL LOW (ref 8–23)
CO2: 22 mmol/L (ref 22–32)
Calcium: 8.3 mg/dL — ABNORMAL LOW (ref 8.9–10.3)
Chloride: 97 mmol/L — ABNORMAL LOW (ref 98–111)
Creatinine, Ser: 0.61 mg/dL (ref 0.61–1.24)
GFR, Estimated: >60 mL/min (ref >60)
Glucose, Bld: 94 mg/dL (ref 70–99)
Potassium: 3.7 mmol/L (ref 3.5–5.1)
Sodium: 127 mmol/L — ABNORMAL LOW (ref 135–145)
Total Bilirubin: 1.6 mg/dL — ABNORMAL HIGH (ref 0.3–1.2)
Total Protein: 5.9 g/dL — ABNORMAL LOW (ref 6.5–8.1)

## 2020-05-30 LAB — TYPE AND SCREEN
ABO/RH(D): B POS
Antibody Screen: NEGATIVE

## 2020-05-30 SURGERY — RESECTION, RECTUM, LOW ANTERIOR, ROBOT-ASSISTED
Anesthesia: General | Site: Abdomen

## 2020-05-30 MED ORDER — BUPIVACAINE HCL (PF) 0.25 % IJ SOLN
INTRAMUSCULAR | Status: DC | PRN
  Administered 2020-05-30: 20 mL

## 2020-05-30 MED ORDER — LACTATED RINGERS IV SOLN: INTRAVENOUS | Status: DC | PRN

## 2020-05-30 MED ORDER — OXYCODONE-ACETAMINOPHEN 5-325 MG PO TABS
1.0000 | ORAL_TABLET | ORAL | Status: DC | PRN
  Administered 2020-05-30 – 2020-05-31 (×2): 2 via ORAL
  Administered 2020-05-31: 1 via ORAL
  Administered 2020-05-31 (×2): 2 via ORAL
  Administered 2020-05-31: 1 via ORAL
  Administered 2020-06-01 – 2020-06-02 (×4): 2 via ORAL
  Filled 2020-05-30 (×5): qty 2
  Filled 2020-05-30: qty 1
  Filled 2020-05-30 (×5): qty 2

## 2020-05-30 MED ORDER — MIDAZOLAM HCL 2 MG/2ML IJ SOLN
INTRAMUSCULAR | Status: AC
  Filled 2020-05-30: qty 2

## 2020-05-30 MED ORDER — PHENYLEPHRINE 40 MCG/ML (10ML) SYRINGE FOR IV PUSH (FOR BLOOD PRESSURE SUPPORT)
PREFILLED_SYRINGE | INTRAVENOUS | Status: DC | PRN
  Administered 2020-05-30: 80 ug via INTRAVENOUS

## 2020-05-30 MED ORDER — OXYCODONE HCL 5 MG PO TABS: 5.0000 mg | ORAL_TABLET | Freq: Once | ORAL | Status: AC | PRN

## 2020-05-30 MED ORDER — ORAL CARE MOUTH RINSE: 15.0000 mL | Freq: Once | OROMUCOSAL | Status: DC

## 2020-05-30 MED ORDER — ONDANSETRON HCL 4 MG/2ML IJ SOLN: 4.0000 mg | Freq: Four times a day (QID) | INTRAMUSCULAR | Status: DC | PRN

## 2020-05-30 MED ORDER — PHENYLEPHRINE HCL-NACL 10-0.9 MG/250ML-% IV SOLN
INTRAVENOUS | Status: DC | PRN
  Administered 2020-05-30: 20 ug/min via INTRAVENOUS

## 2020-05-30 MED ORDER — ONDANSETRON HCL 4 MG/2ML IJ SOLN
INTRAMUSCULAR | Status: DC | PRN
  Administered 2020-05-30: 4 mg via INTRAVENOUS

## 2020-05-30 MED ORDER — BUPIVACAINE LIPOSOME 1.3 % IJ SUSP
INTRAMUSCULAR | Status: DC | PRN
  Administered 2020-05-30: 20 mL

## 2020-05-30 MED ORDER — CHLORHEXIDINE GLUCONATE 0.12 % MT SOLN
OROMUCOSAL | Status: AC
  Filled 2020-05-30: qty 15

## 2020-05-30 MED ORDER — LIDOCAINE 2% (20 MG/ML) 5 ML SYRINGE
INTRAMUSCULAR | Status: DC | PRN
  Administered 2020-05-30: 60 mg via INTRAVENOUS

## 2020-05-30 MED ORDER — MIDAZOLAM HCL 5 MG/5ML IJ SOLN
INTRAMUSCULAR | Status: DC | PRN
  Administered 2020-05-30: 2 mg via INTRAVENOUS

## 2020-05-30 MED ORDER — BUPIVACAINE LIPOSOME 1.3 % IJ SUSP
INTRAMUSCULAR | Status: AC
  Filled 2020-05-30: qty 20

## 2020-05-30 MED ORDER — ROCURONIUM BROMIDE 10 MG/ML (PF) SYRINGE
PREFILLED_SYRINGE | INTRAVENOUS | Status: AC
  Filled 2020-05-30: qty 10

## 2020-05-30 MED ORDER — LIDOCAINE 2% (20 MG/ML) 5 ML SYRINGE
INTRAMUSCULAR | Status: AC
  Filled 2020-05-30: qty 5

## 2020-05-30 MED ORDER — FENTANYL CITRATE (PF) 250 MCG/5ML IJ SOLN
INTRAMUSCULAR | Status: AC
  Filled 2020-05-30: qty 5

## 2020-05-30 MED ORDER — INDOCYANINE GREEN 25 MG IV SOLR
INTRAVENOUS | Status: DC | PRN
  Administered 2020-05-30: 7.5 mg via INTRAVENOUS

## 2020-05-30 MED ORDER — ONDANSETRON HCL 4 MG/2ML IJ SOLN
INTRAMUSCULAR | Status: AC
  Filled 2020-05-30: qty 2

## 2020-05-30 MED ORDER — 0.9 % SODIUM CHLORIDE (POUR BTL) OPTIME
TOPICAL | Status: DC | PRN
  Administered 2020-05-30 (×2): 1000 mL

## 2020-05-30 MED ORDER — PROPOFOL 10 MG/ML IV BOLUS
INTRAVENOUS | Status: DC | PRN
  Administered 2020-05-30: 130 mg via INTRAVENOUS

## 2020-05-30 MED ORDER — ALBUMIN HUMAN 5 % IV SOLN: INTRAVENOUS | Status: DC | PRN

## 2020-05-30 MED ORDER — BUPIVACAINE HCL (PF) 0.25 % IJ SOLN
INTRAMUSCULAR | Status: AC
  Filled 2020-05-30: qty 30

## 2020-05-30 MED ORDER — FENTANYL CITRATE (PF) 250 MCG/5ML IJ SOLN
INTRAMUSCULAR | Status: DC | PRN
  Administered 2020-05-30 (×3): 50 ug via INTRAVENOUS
  Administered 2020-05-30: 100 ug via INTRAVENOUS

## 2020-05-30 MED ORDER — OXYCODONE HCL 5 MG/5ML PO SOLN
ORAL | Status: AC
  Filled 2020-05-30: qty 5

## 2020-05-30 MED ORDER — DEXAMETHASONE SODIUM PHOSPHATE 10 MG/ML IJ SOLN
INTRAMUSCULAR | Status: AC
  Filled 2020-05-30: qty 1

## 2020-05-30 MED ORDER — DEXAMETHASONE SODIUM PHOSPHATE 10 MG/ML IJ SOLN
INTRAMUSCULAR | Status: DC | PRN
  Administered 2020-05-30: 8 mg via INTRAVENOUS

## 2020-05-30 MED ORDER — ROCURONIUM BROMIDE 10 MG/ML (PF) SYRINGE
PREFILLED_SYRINGE | INTRAVENOUS | Status: DC | PRN
  Administered 2020-05-30: 20 mg via INTRAVENOUS
  Administered 2020-05-30: 50 mg via INTRAVENOUS

## 2020-05-30 MED ORDER — PROPOFOL 10 MG/ML IV BOLUS
INTRAVENOUS | Status: AC
  Filled 2020-05-30: qty 20

## 2020-05-30 MED ORDER — CHLORHEXIDINE GLUCONATE 0.12 % MT SOLN: 15.0000 mL | Freq: Once | OROMUCOSAL | Status: DC

## 2020-05-30 MED ORDER — LACTATED RINGERS IV SOLN: INTRAVENOUS | Status: DC

## 2020-05-30 MED ORDER — INDOCYANINE GREEN 25 MG IV SOLR
INTRAVENOUS | Status: AC
  Filled 2020-05-30: qty 10

## 2020-05-30 MED ORDER — FENTANYL CITRATE (PF) 100 MCG/2ML IJ SOLN: 25.0000 ug | INTRAMUSCULAR | Status: DC | PRN

## 2020-05-30 MED ORDER — OXYCODONE HCL 5 MG/5ML PO SOLN
5.0000 mg | Freq: Once | ORAL | Status: AC | PRN
  Administered 2020-05-30: 5 mg via ORAL

## 2020-05-30 MED ORDER — SUGAMMADEX SODIUM 200 MG/2ML IV SOLN
INTRAVENOUS | Status: DC | PRN
  Administered 2020-05-30: 200 mg via INTRAVENOUS

## 2020-05-30 MED ORDER — OXYCODONE-ACETAMINOPHEN 5-325 MG PO TABS: 1.0000 | ORAL_TABLET | ORAL | Status: DC

## 2020-05-30 SURGICAL SUPPLY — 133 items
ADH SKN CLS APL DERMABOND .7 (GAUZE/BANDAGES/DRESSINGS) ×1
ADH SKN CLS LQ APL DERMABOND (GAUZE/BANDAGES/DRESSINGS) ×1
APL PRP STRL LF DISP 70% ISPRP (MISCELLANEOUS) ×1
APPLIER CLIP 5 13 M/L LIGAMAX5 (MISCELLANEOUS)
APPLIER CLIP ROT 10 11.4 M/L (STAPLE)
APR CLP MED LRG 11.4X10 (STAPLE)
APR CLP MED LRG 5 ANG JAW (MISCELLANEOUS)
BLADE EXTENDED COATED 6.5IN (ELECTRODE) ×2 IMPLANT
CANNULA REDUC XI 12-8 STAPL (CANNULA) ×2
CANNULA REDUCER 12-8 DVNC XI (CANNULA) ×1 IMPLANT
CELLS DAT CNTRL 66122 CELL SVR (MISCELLANEOUS) IMPLANT
CHLORAPREP W/TINT 26 (MISCELLANEOUS) ×2 IMPLANT
CLIP APPLIE 5 13 M/L LIGAMAX5 (MISCELLANEOUS) IMPLANT
CLIP APPLIE ROT 10 11.4 M/L (STAPLE) IMPLANT
CLIP LIGATING HEM O LOK PURPLE (MISCELLANEOUS) ×1 IMPLANT
CLIP VESOLOCK LG 6/CT PURPLE (CLIP) IMPLANT
CLIP VESOLOCK MED LG 6/CT (CLIP) IMPLANT
COVER MAYO STAND STRL (DRAPES) ×2 IMPLANT
COVER SURGICAL LIGHT HANDLE (MISCELLANEOUS) ×3 IMPLANT
COVER TIP SHEARS 8 DVNC (MISCELLANEOUS) IMPLANT
COVER TIP SHEARS 8MM DA VINCI (MISCELLANEOUS)
COVER WAND RF STERILE (DRAPES) IMPLANT
DECANTER SPIKE VIAL GLASS SM (MISCELLANEOUS) ×2 IMPLANT
DEFOGGER SCOPE WARMER CLEARIFY (MISCELLANEOUS) ×2 IMPLANT
DERMABOND ADHESIVE PROPEN (GAUZE/BANDAGES/DRESSINGS) ×1
DERMABOND ADVANCED (GAUZE/BANDAGES/DRESSINGS) ×1
DERMABOND ADVANCED .7 DNX12 (GAUZE/BANDAGES/DRESSINGS) ×1 IMPLANT
DERMABOND ADVANCED .7 DNX6 (GAUZE/BANDAGES/DRESSINGS) IMPLANT
DEVICE TROCAR PUNCTURE CLOSURE (ENDOMECHANICALS) IMPLANT
DRAIN CHANNEL 19F RND (DRAIN) ×1 IMPLANT
DRAPE ARM DVNC X/XI (DISPOSABLE) ×4 IMPLANT
DRAPE COLUMN DVNC XI (DISPOSABLE) ×1 IMPLANT
DRAPE CV SPLIT W-CLR ANES SCRN (DRAPES) ×2 IMPLANT
DRAPE DA VINCI XI ARM (DISPOSABLE) ×8
DRAPE DA VINCI XI COLUMN (DISPOSABLE) ×2
DRAPE ORTHO SPLIT 77X108 STRL (DRAPES) ×2
DRAPE SURG IRRIG POUCH 19X23 (DRAPES) ×2 IMPLANT
DRAPE SURG ORHT 6 SPLT 77X108 (DRAPES) ×1 IMPLANT
DRSG OPSITE POSTOP 4X10 (GAUZE/BANDAGES/DRESSINGS) IMPLANT
DRSG OPSITE POSTOP 4X6 (GAUZE/BANDAGES/DRESSINGS) IMPLANT
DRSG OPSITE POSTOP 4X8 (GAUZE/BANDAGES/DRESSINGS) IMPLANT
DRSG TEGADERM 2-3/8X2-3/4 SM (GAUZE/BANDAGES/DRESSINGS) ×10 IMPLANT
DRSG TEGADERM 4X4.75 (GAUZE/BANDAGES/DRESSINGS) ×2 IMPLANT
ELECT REM PT RETURN 9FT ADLT (ELECTROSURGICAL) ×2
ELECTRODE REM PT RTRN 9FT ADLT (ELECTROSURGICAL) ×1 IMPLANT
ENDOLOOP SUT PDS II  0 18 (SUTURE)
ENDOLOOP SUT PDS II 0 18 (SUTURE) IMPLANT
EVACUATOR SILICONE 100CC (DRAIN) ×2 IMPLANT
GAUZE SPONGE 2X2 8PLY STRL LF (GAUZE/BANDAGES/DRESSINGS) ×1 IMPLANT
GAUZE SPONGE 4X4 12PLY STRL (GAUZE/BANDAGES/DRESSINGS) IMPLANT
GLOVE SURG ENC MOIS LTX SZ7.5 (GLOVE) ×6 IMPLANT
GLOVE SURG UNDER LTX SZ8 (GLOVE) ×6 IMPLANT
GOWN STRL REUS W/ TWL LRG LVL3 (GOWN DISPOSABLE) ×2 IMPLANT
GOWN STRL REUS W/ TWL XL LVL3 (GOWN DISPOSABLE) ×3 IMPLANT
GOWN STRL REUS W/TWL 2XL LVL3 (GOWN DISPOSABLE) ×2 IMPLANT
GOWN STRL REUS W/TWL LRG LVL3 (GOWN DISPOSABLE) ×4
GOWN STRL REUS W/TWL XL LVL3 (GOWN DISPOSABLE) ×6
GRASPER SUT TROCAR 14GX15 (MISCELLANEOUS) IMPLANT
KIT BASIN OR (CUSTOM PROCEDURE TRAY) ×2 IMPLANT
KIT OSTOMY DRAINABLE 2.75 STR (WOUND CARE) ×1 IMPLANT
KIT TURNOVER KIT B (KITS) ×2 IMPLANT
LEGGING LITHOTOMY PAIR STRL (DRAPES) ×1 IMPLANT
NDL INSUFFLATION 14GA 120MM (NEEDLE) ×1 IMPLANT
NEEDLE INSUFFLATION 14GA 120MM (NEEDLE) ×2 IMPLANT
PACK COLON (CUSTOM PROCEDURE TRAY) IMPLANT
PAD ARMBOARD 7.5X6 YLW CONV (MISCELLANEOUS) ×4 IMPLANT
PENCIL SMOKE EVACUATOR (MISCELLANEOUS) IMPLANT
PORT LAP GEL ALEXIS MED 5-9CM (MISCELLANEOUS) ×2 IMPLANT
PROTECTOR NERVE ULNAR (MISCELLANEOUS) ×4 IMPLANT
RELOAD STAPLE 45 3.5 BLU DVNC (STAPLE) IMPLANT
RELOAD STAPLE 45 4.3 GRN DVNC (STAPLE) IMPLANT
RELOAD STAPLE 60 3.5 BLU DVNC (STAPLE) IMPLANT
RELOAD STAPLE 60 4.3 GRN DVNC (STAPLE) IMPLANT
RELOAD STAPLER 3.5X45 BLU DVNC (STAPLE) IMPLANT
RELOAD STAPLER 3.5X60 BLU DVNC (STAPLE) IMPLANT
RELOAD STAPLER 4.3X45 GRN DVNC (STAPLE) IMPLANT
RELOAD STAPLER 4.3X60 GRN DVNC (STAPLE) ×2 IMPLANT
RETRACTOR WND ALEXIS 18 MED (MISCELLANEOUS) IMPLANT
RTRCTR WOUND ALEXIS 18CM MED (MISCELLANEOUS)
SCISSORS LAP 5X35 DISP (ENDOMECHANICALS) IMPLANT
SEAL CANN UNIV 5-8 DVNC XI (MISCELLANEOUS) ×4 IMPLANT
SEAL XI 5MM-8MM UNIVERSAL (MISCELLANEOUS) ×8
SEALER VESSEL DA VINCI XI (MISCELLANEOUS) ×2
SEALER VESSEL EXT DVNC XI (MISCELLANEOUS) ×1 IMPLANT
SET IRRIG TUBING LAPAROSCOPIC (IRRIGATION / IRRIGATOR) ×2 IMPLANT
SET TUBE SMOKE EVAC HIGH FLOW (TUBING) ×2 IMPLANT
SLEEVE ADV FIXATION 5X100MM (TROCAR) IMPLANT
SOLUTION ELECTROLUBE (MISCELLANEOUS) ×2 IMPLANT
SPONGE GAUZE 2X2 STER 10/PKG (GAUZE/BANDAGES/DRESSINGS) ×1
STAPLER 60 DA VINCI SURE FORM (STAPLE) ×2
STAPLER 60 SUREFORM DVNC (STAPLE) ×1 IMPLANT
STAPLER CANNULA SEAL DVNC XI (STAPLE) ×1 IMPLANT
STAPLER CANNULA SEAL XI (STAPLE) ×2
STAPLER PROXIMATE 75MM BLUE (STAPLE) ×1 IMPLANT
STAPLER RELOAD 3.5X45 BLU DVNC (STAPLE)
STAPLER RELOAD 3.5X45 BLUE (STAPLE)
STAPLER RELOAD 3.5X60 BLU DVNC (STAPLE)
STAPLER RELOAD 3.5X60 BLUE (STAPLE)
STAPLER RELOAD 4.3X45 GREEN (STAPLE)
STAPLER RELOAD 4.3X45 GRN DVNC (STAPLE)
STAPLER RELOAD 4.3X60 GREEN (STAPLE) ×4
STAPLER RELOAD 4.3X60 GRN DVNC (STAPLE) ×2
STAPLER VISISTAT 35W (STAPLE) ×2 IMPLANT
STOPCOCK 4 WAY LG BORE MALE ST (IV SETS) ×4 IMPLANT
SURGILUBE 2OZ TUBE FLIPTOP (MISCELLANEOUS) ×2 IMPLANT
SUT MNCRL AB 4-0 PS2 18 (SUTURE) ×3 IMPLANT
SUT PDS AB 1 CT1 36 (SUTURE) ×1 IMPLANT
SUT PDS AB 1 TP1 96 (SUTURE) IMPLANT
SUT PROLENE 0 CT 2 (SUTURE) IMPLANT
SUT PROLENE 2 0 KS (SUTURE) ×1 IMPLANT
SUT PROLENE 2 0 SH DA (SUTURE) IMPLANT
SUT SILK 2 0 (SUTURE)
SUT SILK 2 0 SH CR/8 (SUTURE) IMPLANT
SUT SILK 2-0 18XBRD TIE 12 (SUTURE) IMPLANT
SUT SILK 3 0 (SUTURE)
SUT SILK 3 0 SH CR/8 (SUTURE) ×1 IMPLANT
SUT SILK 3-0 18XBRD TIE 12 (SUTURE) ×1 IMPLANT
SUT V-LOC BARB 180 2/0GR6 GS22 (SUTURE)
SUT VIC AB 3-0 SH 18 (SUTURE) ×1 IMPLANT
SUT VIC AB 3-0 SH 27 (SUTURE)
SUT VIC AB 3-0 SH 27XBRD (SUTURE) IMPLANT
SUT VICRYL 0 UR6 27IN ABS (SUTURE) ×2 IMPLANT
SUTURE V-LC BRB 180 2/0GR6GS22 (SUTURE) IMPLANT
SYR 10ML LL (SYRINGE) ×2 IMPLANT
SYS LAPSCP GELPORT 120MM (MISCELLANEOUS)
SYSTEM LAPSCP GELPORT 120MM (MISCELLANEOUS) IMPLANT
TAPE UMBILICAL COTTON 1/8X30 (MISCELLANEOUS) ×1 IMPLANT
TOWEL GREEN STERILE FF (TOWEL DISPOSABLE) ×2 IMPLANT
TOWEL OR NON WOVEN STRL DISP B (DISPOSABLE) ×2 IMPLANT
TRAY FOLEY MTR SLVR 16FR STAT (SET/KITS/TRAYS/PACK) ×2 IMPLANT
TRAY LAPAROSCOPIC MC (CUSTOM PROCEDURE TRAY) ×1 IMPLANT
TROCAR ADV FIXATION 5X100MM (TROCAR) ×2 IMPLANT
TUBE CONNECTING 12X1/4 (SUCTIONS) ×4 IMPLANT

## 2020-05-30 NOTE — Progress Notes (Signed)
[facility] Progress Note  Day of Surgery  Subjective: CC:  Doing well. States he is ready for surgery. Drank part of bowel prep with clear result. Denies n/v/abdominal pain.  Objective: Vital signs in last 24 hours: Temp:  [98.2 F (36.8 C)-98.6 F (37 C)] 98.2 F (36.8 C) (04/06 0838) Pulse Rate:  [100-107] 107 (04/06 0838) Resp:  [19-28] 20 (04/06 0838) BP: (97-119)/(61-88) 119/88 (04/06 0838) SpO2:  [95 %-100 %] 97 % (04/06 0838) Weight:  [54.9 kg] 54.9 kg (04/06 0600) Last BM Date: 05/30/20  Intake/Output from previous day: 04/05 0701 - 04/06 0700 In: 71 [P.O.:990] Out: 852 [Urine:850; Stool:2] Intake/Output this shift: No intake/output data recorded.  PE: Gen:  Alert, NAD, unpleasant, cooperative.  Card:  Regular rate and rhythm, pedal pulses 2+ BL Pulm:  Normal effort, clear to auscultation bilaterally Abd: Soft, non-tender, distended, bowel sounds present  Skin: warm and dry, no rashes  Psych: A&Ox3   Lab Results:  Recent Labs    05/29/20 0328 05/30/20 0226  WBC 8.6 6.3  HGB 8.4* 9.9*  HCT 27.7* 31.7*  PLT 262 275   BMET Recent Labs    05/29/20 0328 05/30/20 0226  NA 131* 127*  K 4.0 3.7  CL 99 97*  CO2 27 22  GLUCOSE 94 94  BUN <5* <5*  CREATININE 0.54* 0.61  CALCIUM 7.9* 8.3*   PT/INR No results for input(s): LABPROT, INR in the last 72 hours. CMP     Component Value Date/Time   NA 127 (L) 05/30/2020 0226   K 3.7 05/30/2020 0226   CL 97 (L) 05/30/2020 0226   CO2 22 05/30/2020 0226   GLUCOSE 94 05/30/2020 0226   BUN <5 (L) 05/30/2020 0226   CREATININE 0.61 05/30/2020 0226   CALCIUM 8.3 (L) 05/30/2020 0226   PROT 5.9 (L) 05/30/2020 0226   ALBUMIN 2.1 (L) 05/30/2020 0226   AST 14 (L) 05/30/2020 0226   ALT 13 05/30/2020 0226   ALKPHOS 71 05/30/2020 0226   BILITOT 1.6 (H) 05/30/2020 0226   GFRNONAA >60 05/30/2020 0226   GFRAA >60 09/09/2019 0207   Lipase  No results found for: LIPASE     Studies/Results: No  results found.  Anti-infectives: Anti-infectives (From admission, onward)   Start     Dose/Rate Route Frequency Ordered Stop   05/30/20 0600  cefoTEtan (CEFOTAN) 2 g in sodium chloride 0.9 % 100 mL IVPB        2 g 200 mL/hr over 30 Minutes Intravenous To Surgery 05/29/20 1430 05/31/20 0600   05/29/20 1400  neomycin (MYCIFRADIN) tablet 1,000 mg       "And" Linked Group Details   1,000 mg Oral 3 times per day 05/29/20 1152 05/29/20 2228   05/29/20 1400  metroNIDAZOLE (FLAGYL) tablet 1,000 mg       "And" Linked Group Details   1,000 mg Oral 3 times per day 05/29/20 1152 05/29/20 2228       Assessment/Plan COPD Non-bleeding duodenal ulcer  Grade B esophogitis, gastritis, esophageal stenosis s/p dilation 4/2 Grade II internal hemorrhoids Iron deficiency anemia  Alcohol use disorder Indirect hyperbilirubinemia History of tobacco use   GIB Rectosigmoid mass Marc Mcdonald is an 65 y.o. male with a new diagnosis of recto-sigmoid cancer. He presented with symptomatic anemia that had been worsening over the last 5 weeks. Overall he appears deconditioned due to weight loss, recent loss of appetite, and fatigue.  - CT chest/abd/pelvis 05/25/2020 w/ out evidence of metastasis - colonoscopy  05/26/2020 w/ malignant partially obstructing tumor in the proximal rectum, pathology pending - CEA 5.6  - MRI pelvis 4/3 read as T3 B, N0, Mx colorectal cancer  - patient would benefit from partial colectomy/colostomy this admission. Will discuss bowel prep and timing of surgery with attending MD.  -WOCN has marked  -The anatomy and physiology of the GI tract was discussed at length with the patient. The pathophysiology of colorectal cancer was discussed at length with associated pictures. -We have reviewed his case at MDT. Consensus recommendation today was for upfront surgery - favored distal most aspect of sigmoid and no real benefit noted to upfront treatment with chemo/cXRT -We have reviewed robotic  low anterior resection with end colostomy. We have discussed this would likely be permeant. We discussed concerns regarding his overall health, nutrition status, and inability to tolerate a leak from physiologic standpoint. Additionally, issues he will potentially encounter with a diverting loop ileostomy. He understands this well. -The planned procedure, material risks (including, but not limited to, pain, bleeding, infection, scarring, need for blood transfusion, damage to surrounding structures- blood vessels/nerves/viscus/organs, damage to ureter, urine leak, leak from anastomosis, need for additional procedures, need for stoma which may be permanent, worsening of pre-existing medical conditions, hernia, recurrence, pneumonia, heart attack, stroke, death) benefits and alternatives to surgery were discussed at length. I noted a good probability that the procedure would help improve their symptoms. The patient's questions were answered to their satisfaction, they voiced understanding and elected to proceed with surgery. Additionally, we discussed typical postoperative expectations and the recovery process.   LOS: 5 days   Nadeen Landau, MD [facility] Use [email or web address] to contact on call provider

## 2020-05-30 NOTE — Op Note (Signed)
05/30/2020  5:27 PM  PATIENT:  Marc Mcdonald  65 y.o. male  Patient Care Team: [place], Blondell Reveal, MD as PCP - General (Family Medicine)  PRE-OPERATIVE DIAGNOSIS: Rectosigmoid colon cancer  POST-OPERATIVE DIAGNOSIS:  Same  PROCEDURE: 1. Robotic assisted low anterior resection 2. Intraoperative assessment of perfusion (ICG) 3. End colostomy 4. Bilateral transversus abdominus plane blocks  SURGEON:  Sharon Mt. Dema Severin, MD  ASSISTANT: Pryor Curia RNFA  ANESTHESIA:   local and general  COUNTS:  Sponge, needle and instrument counts were reported correct x2 at the conclusion of the operation.  EBL: 125 cc  DRAINS: 41 Fr round blake drain left draining the pelvis  SPECIMEN: Rectosigmoid colon - stitch is on proximal end  COMPLICATIONS: None  FINDINGS: Mass in the rectosigmoid colon.  DISPOSITION: PACU in satisfactory condition  DESCRIPTION: The patient was identified in preop holding and taken to the OR where he was placed on the operating room table. SCDs were placed.  Arms were tucked.  General endotracheal anesthesia was induced without difficulty.  A Foley catheter was placed under sterile conditions.  Hair on the abdomen was clipped by the OR team.  He was placed in lithotomy position using Allen stirrups.  Pressure points were then evaluated and padded.  He was then prepped and draped in the usual sterile fashion. A surgical timeout was performed indicating the correct patient, procedure, positioning and need for preoperative antibiotics.   An OG tube was placed to suction by anesthesia.  At Palmer's point, a stab incision was created.  The Veress needle was introduced into the peritoneal cavity on the first attempt.  Intraperitoneal location was confirmed with the aspiration and saline drop test.  Pneumoperitoneum was established to maximum pressure 15 mmHg.  The abdomen is then marked for the planned trocar sites.  This roughly extends in a line from the left upper  quadrant down towards the right ASIS.  A 12 mm trocar is also planned at the left colostomy site.  Just to the right and cephalad of the umbilicus, an 8 mm blunt tipped robotic trocar was cautiously placed into the peritoneal cavity.  The laparoscope was inserted and demonstrated no evidence of Veress needle or trocar site complication.  3 additional 8 mm trochars were placed in the planned locations.  An additional 12 mm trocar was placed at the colostomy site.  A 5 mm assist port was placed in the right lateral abdomen.  The abdomen was surveyed.  The liver is relatively normal in appearance and there are no evident masses.  The peritoneal surface has no evident masses.  The tattoo was identified in the pelvis and there is a mass at the rectosigmoid junction overlying the sacral promontory.  This involves the distal most aspect of sigmoid colon.  This encroaches on the proximalmost portion of the rectum.  He is positioned in Trendelenburg with left side up.  The robot is then docked and the instruments placed.  I then went to the console.  Sigmoidal attachments were first freed from the left lateral sidewall.  Following this, the rectosigmoid colon is elevated anteriorly.  The peritoneum on the right side is incised.  The TME plane is gained staying between the presacral fascia and the fascia propria of the rectum.  Dissection is commenced into the pelvis from the posterior approach.  Working cephalad, the IMA pedicle was identified.  The plane is developed lateral to this in the left ureter is readily identified.  The left gonadal vessels  were identified.  These were both swept down.  Hypogastric nerves were also visualized and swept down.  The mesorectum is quite fragile as it is much of the adipose tissue within his abdomen and fractures with minimal manipulation.  The posterior pelvic dissection is commenced staying within the TME plane.  Going approximately 5 cm distal to the level of the tumor and  performing a mesorectal specific TME, the posterior plane is fully developed.  Following this, the lateral stalks were divided in the lateral plane developed.  This then terminates anteriorly just below the peritoneal reflection.  Attention was then turned to division of the IMA.  The IMA circumferentially dissected at the level of the superior hemorrhoidal.  The left ureter was again confirmed to be well away from our plan dissection.  After the IMA was circumferentially dissected, the vessel sealer was first applied.  The tissue was not cut and the did not appear to be an adequate seal.  Therefore, 2 hemoclips were placed on the IMA pedicle.  The IMA was then ligated and divided using the vessel sealer.  The stump was inspected and hemostasis was present.  The plane was developed further cephalad behind the descending mesocolon.  The Caron Ode line of Toldt was then incised laterally freeing the entire sigmoid and proximal half of the descending colon.  Attention was then turned back to the pelvis.  Our distal point of transection was identified again 5 cm below the tumor.  The mesorectum at this level was then divided exposing the rectum.  The rectum was then divided using a 60 mm green load robotic stapler.  The rectal stump was inspected and noted to be hemostatic with an intact staple line.  The pelvis was irrigated and hemostasis verified.  The proximal point of transection was identified on the descending colon at a level where the IMA pedicle was included with the specimen.  The mesentery was then divided using the vessel sealer out to the level of the proximal point of planned transection.  Attention was then turned to performing ICG perfusion test.  ICG was administered by anesthesia and avid uptake was observed at the level of the planned proximal point of transection.  The rectal stump also appears well perfused.  Given concerns regarding his nutrition, overall health status, and general poor quality  was tissues intraoperatively, the decision was made to proceed with an end colostomy.  I then scrubbed back in.  The robot was undocked.  The skin at the 12 mm port site was excised to facilitate creation of a colostomy.  The fascia was slightly widened.  An [place] wound protector was placed.  The specimen was delivered through the wound protector.  The proximal point of transection was identified where the mesentery been cleared.  A GIA stapler was used to divide the colon and the proximal and marked with a stitch.  This was passed off the specimen.  Pneumoperitoneum was reestablished and orientation of the colostomy was confirmed such that there is no twisting.  A 19 French round Blake drain was placed into the pelvis.  The wound protector was removed.  Port sites were inspected for hemostasis after removal of all trochars.  Attention was then turned to closing the skin sites.  4 Monocryl subcuticular suture was used to close the skin of all port sites.  A 2-0 nylon suture was used as a drain stitch.  The drain was attached to bulb suction.  The fascia at the colostomy site was then  partially closed to facilitate a appropriate sized aperture for the colostomy through the fascia.  This was done using interrupted #1 PDS sutures.  Sponge, needle, and instrument counts were reported correct x2. The staple line was excised and the colostomy "brooked" using 3-0 Vicryl suture.  An ostomy appliance was then cut to size.  He was then taken out of the lithotomy position, awakened from anesthesia, extubated, and transferred to a stretcher for transport to PACU in satisfactory condition.

## 2020-05-30 NOTE — Progress Notes (Signed)
PT Cancellation Note  Patient Details Name: Marc Mcdonald MRN: [id number] DOB: September 22, 1955   Cancelled Treatment:    Reason Eval/Treat Not Completed: Patient at procedure or test/unavailable. Pt off unit for surgery. PT will follow up once surgery is complete and pt is medically appropriate to mobilize.   Zenaida Niece 05/30/2020, 2:57 PM

## 2020-05-30 NOTE — Progress Notes (Signed)
Patient encouraged to continue to drink bowel prep. Patient had several liquid stools and became somewhat agitated at having to continue to drink. He refused at times or would take one sip and stop. Educated patient on process of preparing for procedure and encouraged him to continue. He had not completed all of the prep at shift change. During handoff he report, after an expression of agitation, he stated he would try to complete the remainder.

## 2020-05-30 NOTE — Transfer of Care (Signed)
Immediate Anesthesia Transfer of Care Note  Patient: Marc Mcdonald  Procedure(s) Performed: XI ROBOTIC ASSISTED LOWER ANTERIOR RESECTION WITH END COLOSTOMY CREATION (N/A Abdomen) COLOSTOMY (N/A Abdomen)  Patient Location: PACU  Anesthesia Type:General  Level of Consciousness: drowsy  Airway & Oxygen Therapy: Patient Spontanous Breathing and Patient connected to face mask oxygen  Post-op Assessment: Report given to RN and Post -op Vital signs reviewed and stable  Post vital signs: Reviewed and stable  Last Vitals:  Vitals Value Taken Time  BP 135/86 05/30/20 1744  Temp    Pulse 88 05/30/20 1744  Resp 18 05/30/20 1744  SpO2 97 % 05/30/20 1744  Vitals shown include unvalidated device data.  Last Pain:  Vitals:   05/30/20 0838  TempSrc: Oral  PainSc:       Patients Stated Pain Goal: 0 (40/98/11 9147)  Complications: No complications documented.

## 2020-05-30 NOTE — Care Management Important Message (Signed)
Important Message  Patient Details  Name: Marc Mcdonald MRN: [id number] Date of Birth: 06-10-1955   Medicare Important Message Given:  Yes     Shelda Altes 05/30/2020, 10:14 AM

## 2020-05-30 NOTE — Anesthesia Preprocedure Evaluation (Signed)
Anesthesia Evaluation  Patient identified by MRN, date of birth, ID band Patient awake    Reviewed: Allergy & Precautions, H&P , NPO status , Patient's Chart, lab work & pertinent test results  Airway Mallampati: II   Neck ROM: full    Dental   Pulmonary COPD, Patient abstained from smoking., former smoker,    breath sounds clear to auscultation       Cardiovascular negative cardio ROS   Rhythm:regular Rate:Normal     Neuro/Psych    GI/Hepatic PUD, GI hemorrhage   Endo/Other    Renal/GU      Musculoskeletal   Abdominal   Peds  Hematology  (+) Blood dyscrasia, anemia ,   Anesthesia Other Findings   Reproductive/Obstetrics                             Anesthesia Physical Anesthesia Plan  ASA: II  Anesthesia Plan: General   Post-op Pain Management:    Induction: Intravenous  PONV Risk Score and Plan: 2 and Ondansetron, Dexamethasone, Midazolam and Treatment may vary due to age or medical condition  Airway Management Planned: Oral ETT  Additional Equipment:   Intra-op Plan:   Post-operative Plan: Extubation in OR  Informed Consent: I have reviewed the patients History and Physical, chart, labs and discussed the procedure including the risks, benefits and alternatives for the proposed anesthesia with the patient or authorized representative who has indicated his/her understanding and acceptance.     Dental advisory given  Plan Discussed with: CRNA, Anesthesiologist and Surgeon  Anesthesia Plan Comments:         Anesthesia Quick Evaluation

## 2020-05-30 NOTE — Progress Notes (Signed)
HD#5 Subjective:  Overnight Events: NAEO  Marc Mcdonald was seen and evaluated at bedside on morning rounds. He is accompanied by his son at bedside. He is doing well this morning. He is anxious to get surgery over with. He has been having several BMs throughout the night and this morning secondary to bowel prep. No abdominal pain, n/v, shortness of breath.   Objective:  Vital signs in last 24 hours: Vitals:   05/29/20 1217 05/29/20 1658 05/29/20 2129 05/30/20 0600  BP: 108/65 112/73 97/61 119/86  Pulse: (!) 113 (!) 104 100 100  Resp: 19 (!) 28 20 19   Temp: 99.3 F (37.4 C) 98.6 F (37 C) 98.5 F (36.9 C) 98.6 F (37 C)  TempSrc: Oral Oral Oral Oral  SpO2: 96% 97% 100% 95%  Weight:    54.9 kg  Height:       Supplemental O2: room air SpO2: 95 %   Physical Exam:  Physical Exam Constitutional:      General: He is not in acute distress.    Appearance: Normal appearance.  Eyes:     General:        Right eye: No discharge.        Left eye: No discharge.  Cardiovascular:     Rate and Rhythm: Regular rhythm. Tachycardia present.  Pulmonary:     Effort: Pulmonary effort is normal.     Breath sounds: Wheezing present.  Abdominal:     General: Bowel sounds are normal. There is no distension.     Palpations: Abdomen is soft.  Skin:    General: Skin is warm.  Neurological:     Mental Status: He is alert.  Psychiatric:        Mood and Affect: Mood normal.     Filed Weights   05/27/20 0645 05/28/20 0447 05/30/20 0600  Weight: 61.1 kg 56.5 kg 54.9 kg     Intake/Output Summary (Last 24 hours) at 05/30/2020 0748 Last data filed at 05/29/2020 1800 Gross per 24 hour  Intake 990 ml  Output 852 ml  Net 138 ml   Net IO Since Admission: -2,557 mL [05/30/20 0748]  Pertinent Labs: CBC Latest Ref Rng & Units 05/30/2020 05/29/2020 05/28/2020  WBC 4.0 - 10.5 K/uL 6.3 8.6 7.5  Hemoglobin 13.0 - 17.0 g/dL 9.9(L) 8.4(L) 8.6(L)  Hematocrit 39.0 - 52.0 % 31.7(L) 27.7(L) 28.7(L)   Platelets 150 - 400 K/uL 275 262 302    CMP Latest Ref Rng & Units 05/30/2020 05/29/2020 05/28/2020  Glucose 70 - 99 mg/dL 94 94 73  BUN 8 - 23 mg/dL <5(L) <5(L) <5(L)  Creatinine 0.61 - 1.24 mg/dL 0.61 0.54(L) 0.57(L)  Sodium 135 - 145 mmol/L 127(L) 131(L) 134(L)  Potassium 3.5 - 5.1 mmol/L 3.7 4.0 3.9  Chloride 98 - 111 mmol/L 97(L) 99 103  CO2 22 - 32 mmol/L 22 27 22   Calcium 8.9 - 10.3 mg/dL 8.3(L) 7.9(L) 8.2(L)  Total Protein 6.5 - 8.1 g/dL 5.9(L) 5.0(L) 5.6(L)  Total Bilirubin 0.3 - 1.2 mg/dL 1.6(H) 1.1 2.2(H)  Alkaline Phos 38 - 126 U/L 71 65 67  AST 15 - 41 U/L 14(L) 13(L) 16  ALT 0 - 44 U/L 13 11 13     Imaging: No results found.  Assessment/Plan:   Active Problems:   Symptomatic anemia   Iron deficiency anemia due to chronic blood loss   Duodenal ulcer   Gastritis and gastroduodenitis   Gastroesophageal reflux disease with esophagitis without hemorrhage   Esophageal stricture  Rectal mass   Protein-calorie malnutrition, severe   Patient Summary: Patient is 65 yo gentlemanwith past medical history of questionable COPD,past left5,6,8ribs fracture who presented to the ED for dizziness, dyspnea with exertion,likely due to symptomatic anemia, found to have duodenal ulcer and a colorectal mass.    Colorectal mass(T3B, N0, Mx) Hartman's type procedure today with Laparoscopic/robotic sigmoidectomy with colostomy.   - f/u tissue pathology - Follow-up with oncology outpatient to decide adjuvant chemotherapy based on surgical pathology results.   Non-bleeding duodenal ulcer Grade B esophagitis without bleeding, gastritis s/p bx, moderate esophageal stenosis s/p dilation Likely in the context of excessive NSAID use, heavy alcohol use and smoking - continue sucrafate - continue protonix 40mg  po BID x10w, then reduce to 40mg  daily - f/u gastric pathology   Iron deficiencyanemia2/2 GI bleed. Patient received 1 dose of Feraheme. Oral iron may be considered on  discharge.  Hemoglobin stable today. - Hemolytic process work-up unremarkable.    - Daily CBC. Transfusion goal <7   MildHyponatremia Sodium dropped to 127 today likely due to bowel prep.  Recheck BMP tomorrow a.m. - Daily BMP   Alcoholuse disorder Does not appear to be in active withdrawal.  Patient still mildly tachycardic from anemia vs alcohol withdrawal.  He has not received any Ativan since admission.  Continue to monitor closely - CIWA w Ativan.    New lung densitieswhich are questioned to be post inflammatory vs atelectasis.   Diet: clear diet VTE: SCDs Code: Full Dispo:Pending partial colectomy/colostomy  Marc Gerold, DO 05/30/2020, 7:48 AM Pager: [phone]  Please contact the on call pager after 5 pm and on weekends at [phone].

## 2020-05-30 NOTE — Anesthesia Procedure Notes (Signed)
Procedure Name: Intubation Date/Time: 05/30/2020 2:02 PM Performed by: Imagene Riches, CRNA Pre-anesthesia Checklist: Patient identified, Emergency Drugs available, Suction available and Patient being monitored Patient Re-evaluated:Patient Re-evaluated prior to induction Oxygen Delivery Method: Circle system utilized Preoxygenation: Pre-oxygenation with 100% oxygen Induction Type: IV induction Ventilation: Mask ventilation without difficulty Laryngoscope Size: Mac and 3 Grade View: Grade I Tube type: Oral Tube size: 7.5 mm Number of attempts: 1 Airway Equipment and Method: Stylet and Oral airway Placement Confirmation: ETT inserted through vocal cords under direct vision,  positive ETCO2 and breath sounds checked- equal and bilateral Secured at: 23 cm Tube secured with: Tape Dental Injury: Teeth and Oropharynx as per pre-operative assessment

## 2020-05-30 NOTE — Progress Notes (Signed)
The proposed treatment discussed in conference is for discussion purposes only and is not a binding recommendation.  The patients have not been physically examined, or presented with their treatment options.  Therefore, final treatment plans cannot be decided.   

## 2020-05-31 ENCOUNTER — Encounter (HOSPITAL_COMMUNITY): Payer: Self-pay | Admitting: Surgery

## 2020-05-31 LAB — CBC WITH DIFFERENTIAL/PLATELET
Abs Immature Granulocytes: 0.04 10*3/uL (ref 0.00–0.07)
Basophils Absolute: 0 10*3/uL (ref 0.0–0.1)
Basophils Relative: 0 %
Eosinophils Absolute: 0 10*3/uL (ref 0.0–0.5)
Eosinophils Relative: 0 %
HCT: 28.7 % — ABNORMAL LOW (ref 39.0–52.0)
Hemoglobin: 8.8 g/dL — ABNORMAL LOW (ref 13.0–17.0)
Immature Granulocytes: 0 %
Lymphocytes Relative: 7 %
Lymphs Abs: 0.7 10*3/uL (ref 0.7–4.0)
MCH: 25.7 pg — ABNORMAL LOW (ref 26.0–34.0)
MCHC: 30.7 g/dL (ref 30.0–36.0)
MCV: 83.7 fL (ref 80.0–100.0)
Monocytes Absolute: 0.5 10*3/uL (ref 0.1–1.0)
Monocytes Relative: 5 %
Neutro Abs: 9.1 10*3/uL — ABNORMAL HIGH (ref 1.7–7.7)
Neutrophils Relative %: 88 %
Platelets: 269 10*3/uL (ref 150–400)
RBC: 3.43 MIL/uL — ABNORMAL LOW (ref 4.22–5.81)
RDW: 24.7 % — ABNORMAL HIGH (ref 11.5–15.5)
WBC: 10.4 10*3/uL (ref 4.0–10.5)
nRBC: 0 % (ref 0.0–0.2)

## 2020-05-31 LAB — COMPREHENSIVE METABOLIC PANEL
ALT: 10 U/L (ref 0–44)
AST: 16 U/L (ref 15–41)
Albumin: 2.1 g/dL — ABNORMAL LOW (ref 3.5–5.0)
Alkaline Phosphatase: 53 U/L (ref 38–126)
Anion gap: 6 (ref 5–15)
BUN: <5 mg/dL — ABNORMAL LOW (ref 8–23)
CO2: 21 mmol/L — ABNORMAL LOW (ref 22–32)
Calcium: 7.8 mg/dL — ABNORMAL LOW (ref 8.9–10.3)
Chloride: 102 mmol/L (ref 98–111)
Creatinine, Ser: 0.52 mg/dL — ABNORMAL LOW (ref 0.61–1.24)
GFR, Estimated: >60 mL/min (ref >60)
Glucose, Bld: 144 mg/dL — ABNORMAL HIGH (ref 70–99)
Potassium: 3.9 mmol/L (ref 3.5–5.1)
Sodium: 129 mmol/L — ABNORMAL LOW (ref 135–145)
Total Bilirubin: 0.9 mg/dL (ref 0.3–1.2)
Total Protein: 5.3 g/dL — ABNORMAL LOW (ref 6.5–8.1)

## 2020-05-31 MED ORDER — METHOCARBAMOL 500 MG PO TABS
500.0000 mg | ORAL_TABLET | Freq: Three times a day (TID) | ORAL | Status: DC
  Administered 2020-05-31 – 2020-06-01 (×6): 500 mg via ORAL
  Filled 2020-05-31 (×8): qty 1

## 2020-05-31 MED ORDER — MORPHINE SULFATE (PF) 2 MG/ML IV SOLN
2.0000 mg | INTRAVENOUS | Status: DC | PRN
Start: 2020-05-31 — End: 2020-06-04
  Administered 2020-06-02 – 2020-06-04 (×4): 2 mg via INTRAVENOUS
  Filled 2020-05-31 (×5): qty 1

## 2020-05-31 MED ORDER — LIDOCAINE 5 % EX PTCH
1.0000 | MEDICATED_PATCH | Freq: Every day | CUTANEOUS | Status: DC
  Administered 2020-06-02 – 2020-06-08 (×7): 1 via TRANSDERMAL
  Filled 2020-05-31 (×10): qty 1

## 2020-05-31 MED ORDER — ACETAMINOPHEN 325 MG PO TABS
650.0000 mg | ORAL_TABLET | Freq: Four times a day (QID) | ORAL | Status: DC
  Administered 2020-05-31: 650 mg via ORAL
  Filled 2020-05-31 (×6): qty 2

## 2020-05-31 NOTE — Anesthesia Postprocedure Evaluation (Signed)
Anesthesia Post Note  Patient: Marc Mcdonald  Procedure(s) Performed: XI ROBOTIC ASSISTED LOWER ANTERIOR RESECTION WITH END COLOSTOMY CREATION (N/A Abdomen) COLOSTOMY (N/A Abdomen)     Patient location during evaluation: PACU Anesthesia Type: General Level of consciousness: awake and alert Pain management: pain level controlled Vital Signs Assessment: post-procedure vital signs reviewed and stable Respiratory status: spontaneous breathing, nonlabored ventilation, respiratory function stable and patient connected to nasal cannula oxygen Cardiovascular status: blood pressure returned to baseline and stable Postop Assessment: no apparent nausea or vomiting Anesthetic complications: no   No complications documented.  Last Vitals:  Vitals:   05/30/20 2314 05/31/20 0230  BP: 110/77 114/75  Pulse: 87 87  Resp: 19 19  Temp: 37.1 C 36.6 C  SpO2: 97% 98%    Last Pain:  Vitals:   05/31/20 0604  TempSrc:   PainSc: [place]

## 2020-05-31 NOTE — Progress Notes (Signed)
HD#6 Subjective:  Overnight Events: none   During evaluation at bedside this morning, patient states he is doing fine, endorses mild left-sided abdominal pain. Reports he is planning on walking around the floor with his nurse. Discussed removing in-dwelling catheter and placing condom catheter instead.  Objective:  Vital signs in last 24 hours: Vitals:   05/30/20 2313 05/30/20 2314 05/31/20 0230 05/31/20 0500  BP: 110/77 110/77 114/75   Pulse: 89 87 87   Resp:  19 19   Temp: 98.8 F (37.1 C) 98.8 F (37.1 C) 97.9 F (36.6 C)   TempSrc: Oral Oral Oral   SpO2: 98% 97% 98%   Weight:    47.7 kg  Height:       Supplemental O2: Room Air SpO2: 98 % O2 Flow Rate (L/min): 6 L/min   Physical Exam:  Physical Exam Constitutional:      General: He is not in acute distress. HENT:     Head: Normocephalic.  Eyes:     General:        Right eye: No discharge.        Left eye: No discharge.  Cardiovascular:     Rate and Rhythm: Normal rate and regular rhythm.  Abdominal:     Tenderness: There is no abdominal tenderness.     Comments: Colostomy bag at the lower left quadrant. Site appears clean and non-infected  Skin:    General: Skin is warm.  Neurological:     Mental Status: He is alert.  Psychiatric:        Mood and Affect: Mood normal.     Filed Weights   05/28/20 0447 05/30/20 0600 05/31/20 0500  Weight: 56.5 kg 54.9 kg 47.7 kg     Intake/Output Summary (Last 24 hours) at 05/31/2020 0638 Last data filed at 05/31/2020 0508 Gross per 24 hour  Intake 1960 ml  Output 1305 ml  Net 655 ml   Net IO Since Admission: -1,902 mL [05/31/20 0638]  Pertinent Labs: CBC Latest Ref Rng & Units 05/31/2020 05/30/2020 05/29/2020  WBC 4.0 - 10.5 K/uL 10.4 6.3 8.6  Hemoglobin 13.0 - 17.0 g/dL 8.8(L) 9.9(L) 8.4(L)  Hematocrit 39.0 - 52.0 % 28.7(L) 31.7(L) 27.7(L)  Platelets 150 - 400 K/uL 269 275 262    CMP Latest Ref Rng & Units 05/31/2020 05/30/2020 05/29/2020  Glucose 70 - 99 mg/dL  144(H) 94 94  BUN 8 - 23 mg/dL <5(L) <5(L) <5(L)  Creatinine 0.61 - 1.24 mg/dL 0.52(L) 0.61 0.54(L)  Sodium 135 - 145 mmol/L 129(L) 127(L) 131(L)  Potassium 3.5 - 5.1 mmol/L 3.9 3.7 4.0  Chloride 98 - 111 mmol/L 102 97(L) 99  CO2 22 - 32 mmol/L 21(L) 22 27  Calcium 8.9 - 10.3 mg/dL 7.8(L) 8.3(L) 7.9(L)  Total Protein 6.5 - 8.1 g/dL 5.3(L) 5.9(L) 5.0(L)  Total Bilirubin 0.3 - 1.2 mg/dL 0.9 1.6(H) 1.1  Alkaline Phos 38 - 126 U/L 53 71 65  AST 15 - 41 U/L 16 14(L) 13(L)  ALT 0 - 44 U/L 10 13 11     Imaging: No results found.  Assessment/Plan:   Active Problems:   Symptomatic anemia   Iron deficiency anemia due to chronic blood loss   Duodenal ulcer   Gastritis and gastroduodenitis   Gastroesophageal reflux disease with esophagitis without hemorrhage   Esophageal stricture   Rectal mass   Protein-calorie malnutrition, severe   Patient Summary: Patient is 65 yo gentlemanwith past medical history of questionable COPD,past left5,6,8ribs fracture who presented to the  ED for dizziness, dyspnea with exertion,likely due to symptomatic anemia, found to have duodenal ulcer and acolorectal mass.   Colorectal mass(T3B, N0, Mx) Patient underwent robotic assisted anterior resection and end colostomy.  Colostomy back in the left lower quadrant.  Wound care is following and educating patient and family how to change the colostomy pouch. - Pain controlled with Tylenol, Percocet and morphine as needed - Clear liquid diet.  Advance as tolerated - f/u tissue pathology -Follow-up with oncology outpatienttodecide adjuvant chemotherapy based onsurgical pathology results.   Non-bleeding duodenal ulcer Grade B esophagitis without bleeding, gastritis s/p bx, moderate esophageal stenosis s/p dilation Likely in the context of excessive NSAID use, heavy alcohol use and smoking - continue sucrafate - continue protonix 40mg  po BID x10w, then reduce to 40mg  daily - f/u gastric  pathology   Iron deficiencyanemia2/2 GI bleed. Patient received 1 dose of Feraheme. Oral iron can be started after discharge. Hemoglobindropped slightly after surgery. -Hemolytic processwork-up unremarkable.   -Daily CBC. Transfusion goal <7   MildHyponatremia Sodium proved to 129 - Daily BMP   Alcoholuse disorder -Continue thiamine and multivitamin   New lung densitieswhich are questioned to be post inflammatory vs atelectasis.   Diet: clear diet VTE: SCDs Code: Full Dispo:Pending PT/OT recs   Gaylan Gerold, DO 05/31/2020, 6:38 AM Pager: [phone]  Please contact the on call pager after 5 pm and on weekends at [phone].

## 2020-05-31 NOTE — TOC Initial Note (Addendum)
Transition of Care [facility]) - Initial/Assessment Note    Patient Details  Name: Marc Mcdonald MRN: [id number] Date of Birth: Dec 15, 1955  Transition of Care [facility]) CM/SW Contact:    Marc Favre, RN Phone Number: 05/31/2020, 12:02 PM  Clinical Narrative:                  Patient from home. Confirmed face sheet information.   Patient and his oldest son Marc Mcdonald live together. Per WOC note son is coming in for ostomy education.  Messaged Marc Mcdonald with [facility] to see if he can accept. Await determination.  Expected Discharge Plan: [place]     Patient Goals and CMS Choice Patient states their goals for this hospitalization and ongoing recovery are:: to return to home CMS Medicare.gov Compare Post Acute Care list provided to:: Patient Choice offered to / list presented to : Patient  Expected Discharge Plan and Services Expected Discharge Plan: [place]   Discharge Planning Services: CM Consult Post Acute Care Choice: [place] arrangements for the past 2 months: Single Family Home                   DME Agency: NA       HH Arranged: RN          Prior Living Arrangements/Services Living arrangements for the past 2 months: Single Family Home Lives with:: Adult Children Patient language and need for interpreter reviewed:: Yes Do you feel safe going back to the place where you live?: Yes      Need for Family Participation in Patient Care: Yes (Comment) Care giver support system in place?: Yes (comment)   Criminal Activity/Legal Involvement Pertinent to Current Situation/Hospitalization: No - Comment as needed  Activities of Daily Living Home Assistive Devices/Equipment: None ADL Screening (condition at time of admission) Patient's cognitive ability adequate to safely complete daily activities?: Yes Is the patient deaf or have difficulty hearing?: No Does the patient have difficulty seeing, even when wearing glasses/contacts?:  No Does the patient have difficulty concentrating, remembering, or making decisions?: No Patient able to express need for assistance with ADLs?: Yes Does the patient have difficulty dressing or bathing?: No Independently performs ADLs?: No Communication: Independent Dressing (OT): Independent Grooming: Independent Feeding: Independent Bathing: Independent Toileting: Independent In/Out Bed: Independent Walks in Home: Independent Does the patient have difficulty walking or climbing stairs?: Yes Weakness of Legs: Both Weakness of Arms/Hands: None  Permission Sought/Granted   Permission granted to share information with : No              Emotional Assessment Appearance:: Appears stated age Attitude/Demeanor/Rapport: Engaged Affect (typically observed): Accepting Orientation: : Oriented to Situation,Oriented to  Time,Oriented to Place,Oriented to Self Alcohol / Substance Use: Not Applicable Psych Involvement: No (comment)  Admission diagnosis:  Symptomatic anemia [D64.9] Gastrointestinal hemorrhage, unspecified gastrointestinal hemorrhage type [K92.2] Patient Active Problem List   Diagnosis Date Noted  . Protein-calorie malnutrition, severe 05/28/2020  . Iron deficiency anemia due to chronic blood loss   . Duodenal ulcer   . Gastritis and gastroduodenitis   . Gastroesophageal reflux disease with esophagitis without hemorrhage   . Esophageal stricture   . Rectal mass   . Dyspnea 05/25/2020  . Hypotension 05/25/2020  . Tachycardia 05/25/2020  . Phase of life problem 05/25/2020  . Pruritus 05/25/2020  . Symptomatic anemia 05/25/2020  . Surgical follow-up care 10/12/2019  . Left-sided chest wall pain 09/14/2019  . Closed traumatic fracture  of ribs of left side with pneumothorax 09/09/2019   PCP:  [place], Blondell Reveal, MD Pharmacy:   [id number] - [place], [place] - [address] [place]  [address] Phone: [phone] Fax: [phone]     Social Determinants of Health (SDOH) Interventions    Readmission Risk Interventions No flowsheet data found.

## 2020-05-31 NOTE — Consult Note (Signed)
[place] Nurse ostomy consult note Stoma type/location: LLQ colostomy, oval, well budded Stomal assessment/size: Oval:  3 cm x 5 cm well budded pink and moist  No stool noted.   Peristomal assessment: Bulging in superior peristomal area.  Treatment options for stomal/peristomal skin: barrier ring and 2 piece 2 3/4" pouch Output none at this time Ostomy pouching: 2pc. 2 3/4" pouch with barrier ring. May add belt once stoma is productive.  Will assess at that time.   Education provided: Discussed emptying when 1/3 full and demonstrated this.  ROll closure, cleaning with toilet paper around edge, and changing twice weekly. His son will help after discharge and plans to be here MOnday for next pouch change.  Patient agrees to try emptying with staff assist once he begins to make stool.  Enrolled patient in [place] program: NO Will follow.  Domenic Moras MSN, RN, FNP-BC CWON Wound, Ostomy, Continence Nurse Pager [phone]

## 2020-05-31 NOTE — Progress Notes (Signed)
PT Cancellation Note  Patient Details Name: Marc Mcdonald MRN: [id number] DOB: 1955/10/26   Cancelled Treatment:    Reason Eval/Treat Not Completed: Patient declined, no reason specified. The pt stated he was told to "take it easy" for the day, and therefore would like to hold on therapy until tomorrow. Despite attempts at encouragement, pt remained adamant that therapies should follow tomorrow, therefore PT will return to re-evaluate following surgery as time/schedule allows.   Karma Ganja, PT, DPT   Acute Rehabilitation Department Pager #: [phone]   Otho Bellows 05/31/2020, 5:56 PM

## 2020-05-31 NOTE — Progress Notes (Signed)
Progress Note  1 Day Post-Op  Subjective: Patient reports he is having abdominal pain. He denies nausea but reports feeling distended this AM. No output from colostomy yet.   Objective: Vital signs in last 24 hours: Temp:  [96.8 F (36 C)-98.8 F (37.1 C)] 97.9 F (36.6 C) (04/07 0230) Pulse Rate:  [78-89] 87 (04/07 0230) Resp:  [15-19] 19 (04/07 0230) BP: (110-142)/(75-89) 114/75 (04/07 0230) SpO2:  [96 %-100 %] 98 % (04/07 0230) Weight:  [47.7 kg] 47.7 kg (04/07 0500) Last BM Date: 05/30/20  Intake/Output from previous day: 04/06 0701 - 04/07 0700 In: 1960 [P.O.:260; I.V.:1300; IV Piggyback:350] Out: 1305 [Urine:980; Drains:200; Blood:125] Intake/Output this shift: Total I/O In: 120 [P.O.:120] Out: 350 [Urine:300; Drains:50]  PE: General: WD, cachectic male who is laying in bed in NAD Heart: regular, rate, and rhythm.   Lungs: Respiratory effort nonlabored Abd: soft, appropriately ttp, moderately distended, BS hypoactive, incisions c/d/i, stoma pink and viable with no output yet, drain in RLQ with bloody drainage    Lab Results:  Recent Labs    05/30/20 0226 05/31/20 0241  WBC 6.3 10.4  HGB 9.9* 8.8*  HCT 31.7* 28.7*  PLT 275 269   BMET Recent Labs    05/30/20 0226 05/31/20 0241  NA 127* 129*  K 3.7 3.9  CL 97* 102  CO2 22 21*  GLUCOSE 94 144*  BUN <5* <5*  CREATININE 0.61 0.52*  CALCIUM 8.3* 7.8*   PT/INR No results for input(s): LABPROT, INR in the last 72 hours. CMP     Component Value Date/Time   NA 129 (L) 05/31/2020 0241   K 3.9 05/31/2020 0241   CL 102 05/31/2020 0241   CO2 21 (L) 05/31/2020 0241   GLUCOSE 144 (H) 05/31/2020 0241   BUN <5 (L) 05/31/2020 0241   CREATININE 0.52 (L) 05/31/2020 0241   CALCIUM 7.8 (L) 05/31/2020 0241   PROT 5.3 (L) 05/31/2020 0241   ALBUMIN 2.1 (L) 05/31/2020 0241   AST 16 05/31/2020 0241   ALT 10 05/31/2020 0241   ALKPHOS 53 05/31/2020 0241   BILITOT 0.9 05/31/2020 0241   GFRNONAA >60 05/31/2020  0241   GFRAA >60 09/09/2019 0207   Lipase  No results found for: LIPASE     Studies/Results: No results found.  Anti-infectives: Anti-infectives (From admission, onward)   Start     Dose/Rate Route Frequency Ordered Stop   05/30/20 0600  cefoTEtan (CEFOTAN) 2 g in sodium chloride 0.9 % 100 mL IVPB        2 g 200 mL/hr over 30 Minutes Intravenous To Surgery 05/29/20 1430 05/30/20 1745   05/29/20 1400  neomycin (MYCIFRADIN) tablet 1,000 mg       "And" Linked Group Details   1,000 mg Oral 3 times per day 05/29/20 1152 05/29/20 2228   05/29/20 1400  metroNIDAZOLE (FLAGYL) tablet 1,000 mg       "And" Linked Group Details   1,000 mg Oral 3 times per day 05/29/20 1152 05/29/20 2228       Assessment/Plan COPD Non-bleeding duodenal ulcer  Grade B esophogitis, gastritis, esophageal stenosis s/p dilation 4/2 Grade II internal hemorrhoids Iron deficiency anemia  Alcohol use disorder Indirect hyperbilirubinemia History of tobacco use   GIB Rectosigmoid mass S/P robotic assisted LAR with end colostomy 05/30/20 Dr. Dema Severin - POD#1 - ok to have CLD now, no bowel function yet and somewhat distended - wait to advance diet until colostomy starts functioning  - drain with sanguinous output, continue to  monitor  - appreciate WOC assistance with colostomy teaching - needs to mobilize today - work on pain control  FEN: CLD VTE: SCDs, will discuss when to start chemical prophylaxis with attending MD today ID: neomycin/flagyl 4/5, cefotetan 4/6  LOS: 6 days    Norm Parcel , [facility] Surgery 05/31/2020, 10:32 AM Please see Amion for pager number during day hours 7:00am-4:30pm

## 2020-05-31 NOTE — Progress Notes (Signed)
Occupational Therapy Treatment Patient Details Name: Marc Mcdonald MRN: [id number] DOB: 1955-03-03 Today's Date: 05/31/2020    History of present illness Pt adm 4/1 with dizziness, weakness, and dyspnea. Pt with Hgb of 5.5 and found to have duodenal ulcer and rectal mass. Oncology and surgery involved and staging of rectal CA in process. PMH - ?copd, lt rib fx's 08/2019, etoh   OT comments  Pt in bed upon arrival, required max encouragement for OOB activity. Pt eventually agreeable to get OOB and ambulate to bathroom for toilet transfers, toileting, grooming at sink and function mobility tolerance to his doorway and back to bed. Pt refused to sit up in recliner at end of session. OT will continue to follow acutely to maximize level of function and safety  Follow Up Recommendations  No OT follow up    Equipment Recommendations  Tub/shower seat    Recommendations for Other Services      Precautions / Restrictions Precautions Precautions: Fall Restrictions Weight Bearing Restrictions: No       Mobility Bed Mobility Overal bed mobility: Modified Independent                  Transfers Overall transfer level: Needs assistance Equipment used: Rolling walker (2 wheeled) Transfers: Sit to/from Stand Sit to Stand: Supervision              Balance Overall balance assessment: Needs assistance Sitting-balance support: No upper extremity supported;Feet supported Sitting balance-Leahy Scale: Good     Standing balance support: No upper extremity supported;During functional activity Standing balance-Leahy Scale: Fair                             ADL either performed or assessed with clinical judgement   ADL Overall ADL's : Needs assistance/impaired     Grooming: Wash/dry hands;Wash/dry face;Set up;Supervision/safety;Standing           Upper Body Dressing : Supervision/safety;Set up;Standing Upper Body Dressing Details (indicate cue type and reason): Pt  able to don hospital gown on back while standing Lower Body Dressing: Supervision/safety;Sitting/lateral leans Lower Body Dressing Details (indicate cue type and reason): donned socks with supervision for safety. Toilet Transfer: Supervision/safety;Ambulation;RW;Regular Toilet;Grab bars;Cueing for safety   Toileting- Clothing Manipulation and Hygiene: Supervision/safety;Sit to/from stand       Functional mobility during ADLs: Supervision/safety;Rolling walker       Vision Patient Visual Report: No change from baseline     Perception     Praxis      Cognition Arousal/Alertness: Awake/alert Behavior During Therapy: Agitated;Impulsive Overall Cognitive Status: No family/caregiver present to determine baseline cognitive functioning                                 General Comments: Pt lacks awareness of safety, deficits, and medical concerns. Pt very agitated reporting that too many people have been in and out of his room. Required max encouragement for OOB activity        Exercises     Shoulder Instructions       General Comments      Pertinent Vitals/ Pain       Pain Assessment: Faces Faces Pain Scale: Hurts little more Pain Location: R side Pain Descriptors / Indicators: Sore Pain Intervention(s): Monitored during session;Repositioned  Home Living  Prior Functioning/Environment              Frequency  Min 2X/week        Progress Toward Goals  OT Goals(current goals can now be found in the care plan section)  Progress towards OT goals: Progressing toward goals  Acute Rehab OT Goals Patient Stated Goal: go home  Plan Discharge plan remains appropriate    Co-evaluation                 AM-PAC OT "6 Clicks" Daily Activity     Outcome Measure   Help from another person eating meals?: None Help from another person taking care of personal grooming?: None Help from  another person toileting, which includes using toliet, bedpan, or urinal?: A Little Help from another person bathing (including washing, rinsing, drying)?: A Little Help from another person to put on and taking off regular upper body clothing?: None Help from another person to put on and taking off regular lower body clothing?: A Little 6 Click Score: 21    End of Session Equipment Utilized During Treatment: Rolling walker  OT Visit Diagnosis: Unsteadiness on feet (R26.81);Other abnormalities of gait and mobility (R26.89);Muscle weakness (generalized) (M62.81)   Activity Tolerance Patient tolerated treatment well   Patient Left in bed;with bed alarm set;with call bell/phone within reach   Nurse Communication          Time: 4174-0814 OT Time Calculation (min): 24 min  Charges: OT General Charges $OT Visit: 1 Visit OT Treatments $Self Care/Home Management : 8-22 mins $Therapeutic Activity: 8-22 mins     Britt Bottom 05/31/2020, 1:14 PM

## 2020-06-01 ENCOUNTER — Other Ambulatory Visit: Payer: Self-pay

## 2020-06-01 DIAGNOSIS — I700 Atherosclerosis of aorta: Secondary | ICD-10-CM | POA: Diagnosis present

## 2020-06-01 LAB — COMPREHENSIVE METABOLIC PANEL
ALT: 11 U/L (ref 0–44)
AST: 15 U/L (ref 15–41)
Albumin: 2 g/dL — ABNORMAL LOW (ref 3.5–5.0)
Alkaline Phosphatase: 51 U/L (ref 38–126)
Anion gap: 4 — ABNORMAL LOW (ref 5–15)
BUN: <5 mg/dL — ABNORMAL LOW (ref 8–23)
CO2: 26 mmol/L (ref 22–32)
Calcium: 8 mg/dL — ABNORMAL LOW (ref 8.9–10.3)
Chloride: 103 mmol/L (ref 98–111)
Creatinine, Ser: 0.57 mg/dL — ABNORMAL LOW (ref 0.61–1.24)
GFR, Estimated: >60 mL/min (ref >60)
Glucose, Bld: 106 mg/dL — ABNORMAL HIGH (ref 70–99)
Potassium: 3.4 mmol/L — ABNORMAL LOW (ref 3.5–5.1)
Sodium: 133 mmol/L — ABNORMAL LOW (ref 135–145)
Total Bilirubin: 0.9 mg/dL (ref 0.3–1.2)
Total Protein: 5.1 g/dL — ABNORMAL LOW (ref 6.5–8.1)

## 2020-06-01 LAB — CBC
HCT: 27.8 % — ABNORMAL LOW (ref 39.0–52.0)
Hemoglobin: 8.4 g/dL — ABNORMAL LOW (ref 13.0–17.0)
MCH: 25.7 pg — ABNORMAL LOW (ref 26.0–34.0)
MCHC: 30.2 g/dL (ref 30.0–36.0)
MCV: 85 fL (ref 80.0–100.0)
Platelets: 288 10*3/uL (ref 150–400)
RBC: 3.27 MIL/uL — ABNORMAL LOW (ref 4.22–5.81)
RDW: 24.8 % — ABNORMAL HIGH (ref 11.5–15.5)
WBC: 6.3 10*3/uL (ref 4.0–10.5)
nRBC: 0 % (ref 0.0–0.2)

## 2020-06-01 MED ORDER — ONDANSETRON HCL 4 MG/2ML IJ SOLN
4.0000 mg | Freq: Four times a day (QID) | INTRAMUSCULAR | Status: DC | PRN
  Administered 2020-06-01 – 2020-06-05 (×4): 4 mg via INTRAVENOUS
  Filled 2020-06-01 (×4): qty 2

## 2020-06-01 MED ORDER — POTASSIUM CHLORIDE IN NACL 20-0.45 MEQ/L-% IV SOLN
INTRAVENOUS | Status: DC
  Filled 2020-06-01 (×2): qty 1000

## 2020-06-01 MED ORDER — SODIUM CHLORIDE 0.9 % IV SOLN: 510.0000 mg | Freq: Once | INTRAVENOUS | Status: DC

## 2020-06-01 MED ORDER — POTASSIUM CHLORIDE CRYS ER 20 MEQ PO TBCR: 40.0000 meq | EXTENDED_RELEASE_TABLET | ORAL | Status: DC

## 2020-06-01 MED ORDER — BOOST / RESOURCE BREEZE PO LIQD CUSTOM: 1.0000 | Freq: Three times a day (TID) | ORAL | Status: DC

## 2020-06-01 NOTE — Progress Notes (Signed)
Nutrition Follow-up  DOCUMENTATION CODES:   Severe malnutrition in context of chronic illness,Underweight  INTERVENTION:   If unable to advance diet past CL soon, recommend consideration of EN vs PN (pending GI function) given severe malnutrition present on admission  Continue MVI with Minerals, Thiamine  Add Boost Breeze po TID, each supplement provides 250 kcal and 9 grams of protein, while on CL.   Once diet advanced to FL, resume Ensure Enlive po TID, each supplement provides 350 kcal and 20 grams of protein    NUTRITION DIAGNOSIS:   Inadequate oral intake related to poor appetite,social / environmental circumstances,altered GI function as evidenced by NPO status.  Being addressed via diet advancement, supplements  GOAL:   Patient will meet greater than or equal to 90% of their needs  Progressing  MONITOR:   Diet advancement,PO intake,Labs,Weight trends  REASON FOR ASSESSMENT:   Consult Assessment of nutrition requirement/status  ASSESSMENT:   65 yo male admitted with symptomatic anemia, dyspnea on exertion, hyponatremia. PMH includes COPD, EtOH abuse (12-15 beers per day) and tobacco abuse (1 pack every other day)   4/6 Robotic assisted LAR with end colostomy  Diet advanced to CL on 4/6. Tolerating CL diet (recprded po 25%) but tired of the limited options.   NPO/CL day 3. Eating Soft/Regular diet prior to surgery and tolerating with po 100%  Abdomen soft, no N/V, some bloating.  Ostomy with no output yet, stoma viable.   Labs: sodium 133 (L), potassium 3.4 (L) Meds: MVI, thiamine, 1/2 NaCL with KCl at 75 ml/hr  Diet Order:   Diet Order            Diet clear liquid Room service appropriate? Yes; Fluid consistency: Thin  Diet effective now                 EDUCATION NEEDS:   Not appropriate for education at this time  Skin:  Skin Assessment: Reviewed RN Assessment  Last BM:  4/3 per pt - prior to procedures  Height:   Ht Readings from Last  1 Encounters:  05/26/20 5\' 11"  (1.803 m)    Weight:   Wt Readings from Last 1 Encounters:  06/01/20 50.7 kg    Ideal Body Weight:  78.2 kg  BMI:  Body mass index is 15.59 kg/m.  Estimated Nutritional Needs:   Kcal:  2000-2200 kcals  Protein:  100-115 g  Fluid:  >/= 2 L   Kerman Passey MS, RDN, LDN, CNSC Registered Dietitian III Clinical Nutrition RD Pager and On-Call Pager Number Located in [place]

## 2020-06-01 NOTE — Progress Notes (Signed)
Physical Therapy Treatment Patient Details Name: Marc Mcdonald MRN: [id number] DOB: 1955/07/11 Today's Date: 06/01/2020    History of Present Illness Pt adm 4/1 with dizziness, weakness, and dyspnea. Pt with Hgb of 5.5 and found to have duodenal ulcer and rectal mass. Oncology and surgery involved and staging of rectal CA in process. PMH - ?copd, lt rib fx's 08/2019, etoh    PT Comments    Pt initially resistive to tx this session.  With max encouragement he was recepetive to tx.   Required cues for safety and educated on ambulating 3x daily to improve bowel function.     Follow Up Recommendations  No PT follow up     Equipment Recommendations  None recommended by PT    Recommendations for Other Services       Precautions / Restrictions Precautions Precautions: Fall Restrictions Weight Bearing Restrictions: No    Mobility  Bed Mobility Overal bed mobility: Modified Independent                  Transfers Overall transfer level: Needs assistance Equipment used: Rolling walker (2 wheeled) Transfers: Sit to/from Stand Sit to Stand: Supervision         General transfer comment: supervision for safety, very impulsive with poor safety.  Ambulation/Gait Ambulation/Gait assistance: Min guard Gait Distance (Feet): 350 Feet Assistive device: Rolling walker (2 wheeled) Gait Pattern/deviations: Step-through pattern;Decreased stride length;Drifts right/left;Wide base of support     General Gait Details: Slightly unsteady gait without overt loss of balance, at times straddling device.   Stairs             Wheelchair Mobility    Modified Rankin (Stroke Patients Only)       Balance Overall balance assessment: Needs assistance Sitting-balance support: No upper extremity supported;Feet supported Sitting balance-Leahy Scale: Good       Standing balance-Leahy Scale: Fair                              Cognition Arousal/Alertness:  Awake/alert Behavior During Therapy: Agitated;Impulsive Overall Cognitive Status: No family/caregiver present to determine baseline cognitive functioning                                 General Comments: Pt lacks awareness of safety, deficits, and medical concerns. Pt very agitated on arrival but once working with PTA was able to establish good rapport and he responded well to tx.      Exercises      General Comments        Pertinent Vitals/Pain Pain Assessment: Faces Faces Pain Scale: Hurts little more Pain Location: R side Pain Descriptors / Indicators: Sore Pain Intervention(s): Monitored during session;Repositioned    Home Living                      Prior Function            PT Goals (current goals can now be found in the care plan section) Acute Rehab PT Goals Patient Stated Goal: go home Potential to Achieve Goals: Good Progress towards PT goals: Progressing toward goals    Frequency    Min 3X/week      PT Plan Current plan remains appropriate    Co-evaluation              AM-PAC PT "6 Clicks" Mobility   Outcome Measure  Help needed turning from your back to your side while in a flat bed without using bedrails?: None Help needed moving from lying on your back to sitting on the side of a flat bed without using bedrails?: None Help needed moving to and from a bed to a chair (including a wheelchair)?: A Little Help needed standing up from a chair using your arms (e.g., wheelchair or bedside chair)?: A Little Help needed to walk in hospital room?: A Little Help needed climbing 3-5 steps with a railing? : A Little 6 Click Score: 20    End of Session   Activity Tolerance: Patient tolerated treatment well Patient left: in bed;with call bell/phone within reach;with bed alarm set;Other (comment) Nurse Communication: Mobility status PT Visit Diagnosis: Unsteadiness on feet (R26.81);Muscle weakness (generalized) (M62.81);History  of falling (Z91.81)     Time: 1610-9604 PT Time Calculation (min) (ACUTE ONLY): 14 min  Charges:  $Gait Training: 8-22 mins                     Marc Mcdonald , PTA Acute Rehabilitation Services Pager [phone] Office [phone]      Marc Mcdonald 06/01/2020, 3:59 PM

## 2020-06-01 NOTE — Progress Notes (Signed)
Progress Note  2 Days Post-Op  Subjective: Patient reports RLQ drain has been putting out a lot. No air or stool from colostomy yet. He is sick of broth. He walked once yesterday. He reports abdominal distention.   Objective: Vital signs in last 24 hours: Temp:  [97.9 F (36.6 C)-99.3 F (37.4 C)] 99.3 F (37.4 C) (04/08 0507) Pulse Rate:  [89-96] 96 (04/08 0507) Resp:  [18] 18 (04/08 0507) BP: (123-139)/(83-97) 127/83 (04/08 0507) SpO2:  [96 %-98 %] 96 % (04/08 0507) Weight:  [50.7 kg] 50.7 kg (04/08 0507) Last BM Date: 05/30/20  Intake/Output from previous day: 04/07 0701 - 04/08 0700 In: 800 [P.O.:800] Out: 1765 [Urine:1500; Drains:265] Intake/Output this shift: No intake/output data recorded.  PE: General: WD, cachectic male who is laying in bed in NAD Heart: regular, rate, and rhythm.   Lungs: Respiratory effort nonlabored Abd: soft, appropriately ttp, moderately distended, BS hypoactive, incisions c/d/i, stoma pink and viable with no output yet, drain in RLQ with bloody serous drainage    Lab Results:  Recent Labs    05/31/20 0241 06/01/20 0232  WBC 10.4 6.3  HGB 8.8* 8.4*  HCT 28.7* 27.8*  PLT 269 288   BMET Recent Labs    05/31/20 0241 06/01/20 0232  NA 129* 133*  K 3.9 3.4*  CL 102 103  CO2 21* 26  GLUCOSE 144* 106*  BUN <5* <5*  CREATININE 0.52* 0.57*  CALCIUM 7.8* 8.0*   PT/INR No results for input(s): LABPROT, INR in the last 72 hours. CMP     Component Value Date/Time   NA 133 (L) 06/01/2020 0232   K 3.4 (L) 06/01/2020 0232   CL 103 06/01/2020 0232   CO2 26 06/01/2020 0232   GLUCOSE 106 (H) 06/01/2020 0232   BUN <5 (L) 06/01/2020 0232   CREATININE 0.57 (L) 06/01/2020 0232   CALCIUM 8.0 (L) 06/01/2020 0232   PROT 5.1 (L) 06/01/2020 0232   ALBUMIN 2.0 (L) 06/01/2020 0232   AST 15 06/01/2020 0232   ALT 11 06/01/2020 0232   ALKPHOS 51 06/01/2020 0232   BILITOT 0.9 06/01/2020 0232   GFRNONAA >60 06/01/2020 0232   GFRAA >60  09/09/2019 0207   Lipase  No results found for: LIPASE     Studies/Results: No results found.  Anti-infectives: Anti-infectives (From admission, onward)   Start     Dose/Rate Route Frequency Ordered Stop   05/30/20 0600  cefoTEtan (CEFOTAN) 2 g in sodium chloride 0.9 % 100 mL IVPB        2 g 200 mL/hr over 30 Minutes Intravenous To Surgery 05/29/20 1430 05/30/20 1745   05/29/20 1400  neomycin (MYCIFRADIN) tablet 1,000 mg       "And" Linked Group Details   1,000 mg Oral 3 times per day 05/29/20 1152 05/29/20 2228   05/29/20 1400  metroNIDAZOLE (FLAGYL) tablet 1,000 mg       "And" Linked Group Details   1,000 mg Oral 3 times per day 05/29/20 1152 05/29/20 2228       Assessment/Plan COPD Non-bleeding duodenal ulcer  Grade B esophogitis, gastritis, esophageal stenosis s/p dilation 4/2 Grade II internal hemorrhoids Iron deficiency anemia  Alcohol use disorder Indirect hyperbilirubinemia History of tobacco use   GIB Rectosigmoid mass S/P robotic assisted LAR with end colostomy 05/30/20 Dr. Dema Severin - POD#2 - ok to have CLD now, no bowel function yet and somewhat distended - wait to advance diet until colostomy starts functioning  - drain with SS output, continue  to monitor  - appreciate WOC assistance with colostomy teaching - needs to mobilize more  FEN: CLD VTE: SCDs ID: neomycin/flagyl 4/5, cefotetan 4/6 Foley: removed POD1  LOS: 7 days    Norm Parcel , [facility] Surgery 06/01/2020, 9:05 AM Please see Amion for pager number during day hours 7:00am-4:30pm

## 2020-06-01 NOTE — Discharge Summary (Signed)
Name: Marc Mcdonald MRN: [id number] DOB: 1955-04-17 65 y.o. PCP: de [place], Raymond J, MD  Date of Admission: 05/25/2020 10:23 AM Date of Discharge: 06/21/20  Attending Physician: Sid Falcon, MD  Discharge Diagnosis: 1. Rectosigmoid adenocarcinoma 2. Post operative ileus 3. Severe protein calorie malnutrition 4. Cellulitis of right abdomen 5. Grade B esophagitis 6. Moderate esophageal stenosis s/p dilation 7. Non-bleeding duodenal ulcer 8. Mild chronic gastritis 9. Grade II internal hemorrhoids 10. Symptomatic iron deficiency anemia 11. Alcohol use disorder 12. Tobacco use disorder 13. COPD 89. Abnormal lung findings on CT 15. Aortic atherosclerosis      Discharge Medications: Allergies as of 06/21/2020   No Known Allergies     Medication List    STOP taking these medications   ibuprofen 200 MG tablet Commonly known as: ADVIL     TAKE these medications   acetaminophen 325 MG tablet Commonly known as: TYLENOL Take 2 tablets (650 mg total) by mouth every 6 (six) hours.   amoxicillin-clavulanate 875-125 MG tablet Commonly known as: AUGMENTIN Take 1 tablet by mouth 2 (two) times daily for 5 days.   docusate sodium 100 MG capsule Commonly known as: COLACE Take 1 capsule (100 mg total) by mouth 2 (two) times daily.   feeding supplement Liqd Take 237 mLs by mouth 3 (three) times daily between meals.   multivitamin Tabs tablet Take 1 tablet by mouth at bedtime.   oxyCODONE 5 MG immediate release tablet Commonly known as: Oxy IR/ROXICODONE Take 1 tablet (5 mg total) by mouth every 4 (four) hours as needed for moderate pain or severe pain.   pantoprazole 40 MG tablet Commonly known as: PROTONIX Take 1 tablet (40 mg total) by mouth 2 (two) times daily.   polyethylene glycol powder 17 GM/SCOOP powder Commonly known as: GLYCOLAX/MIRALAX Dissolve 1 capful (17gm total) in water, and take by mouth daily. Start taking on: June 22, 2020   sucralfate 1 g  tablet Commonly known as: CARAFATE Take 1 tablet (1 g total) by mouth 4 (four) times daily.   thiamine 100 MG tablet Take 1 tablet (100 mg total) by mouth at bedtime.            Durable Medical Equipment  (From admission, onward)         Start     Ordered   06/21/20 1130  For home use only DME Walker rolling  Once       Question Answer Comment  Walker: With [place] Wheels   Patient needs a walker to treat with the following condition Weakness      06/21/20 1129   06/21/20 0000  For home use only DME Walker rolling       Comments: Rolling walker with 5'' wheels  Question Answer Comment  Walker: With [place]   Patient needs a walker to treat with the following condition Colorectal cancer ([place])      06/21/20 1119   06/01/20 0909  For home use only DME Shower stool  Once        06/01/20 0908          Disposition and follow-up:   Marc Mcdonald was discharged from [facility] in Stable condition.  At the hospital follow up visit please address:  Stage IIA (pT3, N0, Mx) Rectosigmoid adenocarcinoma s/p robot assisted low anterior resection with end ostomy 05/30/20 Post operative ileus  Right sided abdominal cellulitis Discharge medications: augmentin x 5 days, oxycodone 5mg  every 4 hours  as needed, tylenol prn, miralax and docusate daily, ostomy supplies Follow up appointments -Follow-up with Dr. Burr Medico, oncology, scheduled for 07/02/20 @ 10:20 AM - Follow-up with Dr. Dema Severin, colorectal surgery, scheduled for 07/05/20 @ 2:50PM Discharge recommendations - Continue full liquid diet. May advance to puree if tolerating. - Home health RN to assist with ostomy - Home health PT, OT ordered for discharge - Re-evaluate cellulitis for signs of worsening infection  Esophagitis (Grade B) Mild chronic gastritis, Non-bleeding duodenal ulcer Iron deficiency anemia secondary to GI bleeding Discharge medications: protonix 40mg  twice daily for 10 weeks, then 40mg   daily, sucralfate 1g three times daily with meals Follow up appointments - Ensure he has follow up at [facility] 1 month after discharge--appointment not scheduled at time of discharge Discharge recommendations - Discourage alcohol, nicotine, NSAID use.  - Repeat CBC at time of follow up - Start iron supplementation once tolerating regular diet  Alcohol use disorder -continue to encourage complete cessation  Tobacco use disorder -continue to encourage cessation  Lung densities.  -Order a repeat chest CT at time of follow up for re-evaluation   Labs / imaging needed at time of follow-up: CBC   Pending labs/ test needing follow-up: NA  Follow-up Appointments:  Follow-up Information    [facility] Follow up.   Specialty: Home Health Services Contact information: [place] [address] [phone]        Ileana Roup, MD. Go on 07/05/2020.   Specialties: General Surgery, Colon and Rectal Surgery Why: Your appointment is 07/05/20 @ 2:50pm Please arrive 30 minuets prior to your appointment to check in and fill out paperwork. Bring photo ID and insurance information. Contact information: [place] [address] [phone]        [facility]. Schedule an appointment as soon as possible for a visit in 1 month(s).   Specialty: Gastroenterology Contact information: [place] [address] [phone]       de [place], Raymond J, MD Follow up.   Specialty: Family Medicine Why: Jun 26, 2020 at 1;10 PM  Contact information: [place] [address] [phone]               Hospital Course: Marc Mcdonald is a 37 yom who presented to [name] ED on 05/25/20 for dizziness and shortness of breath for 5 weeks. Workup in the emergency room revealed a hemoglobin of 5.5, positive FOBT, sodium 125, bilirubin 2.1, indirect bilirubin 1.9,  lactic acid of 5.3.  He had denied any melena, hematochezia or other source of bleeding. He had not sought out medical care for >30 years so did not have baseline labs aside from a hemoglobin during an ED visit in 2021, at which time his hemoglobin was 13.  He had noted abdominal pain and poor appetite for the past few months. He was consuming a bottle of ibuprofen every few days for the abdominal pain. He had been drinking 12-15 beers per day. He smoked around 0.5-1 pack of cigarettes per day.  Collateral information from his daughter in law noted that he had an unintentional 10 pound weight loss since July 2021.    Hemoglobin stabilized after 3 blood transfusions. Iron panel was consistent with iron deficiency. He received a feraheme infusion.  He underwent an EGD on hospital day #1 which showed esophagitis, esophageal stricture, a large duodenal ulcer, gastritis, and hemorrhoids. The esophageal stricture was dilated during the EGD. He  was subsequently started started on protonix and sucrafate. Biopsies from EGD were consistent with mild chronic gastritis. Warthin Starry stain was negative for H. Pylori. Duodenal biopsy showed gastric heterotopia and staining was also negative for H. Pylori.   Colonoscopy was performed on hospital day #1 which showed a partially obstructing mass in the rectosigmoid region, which was tattooed.  MRI abdomen and pelvis findings were consistent with [id number]. Tumor location was felt to be in the distal sigmoid colon rather than the rectum so it was recommended that he undergo surgery up front. Oncology was consulted and Dr. Burr Medico evaluated him this admission.  He underwent a robot assisted anterior resection with end ostomy on 4/6, hospital day #6 by Dr. Dema Severin. He tolerated the procedure well. He underwent ostomy education by our wound care staff and was reported to be able to care for the ostomy by the time of discharge.   His hospitalization is complicated by 2  episodes of postop ileus and small bowel obstruction, on POD day #4 and 10.  NG tube was placed for both episode for decompression.  TPN was started during his n.p.o. status and was discontinued 2 days prior to discharge. Patient tolerated full liquid diet well.  He will continue full liquid after discharge and slowly advance diet to puree.  He will follow-up with surgery and oncology outpatient.  On the evening prior to discharge, he was noted to have some pruritis and erythema over the location of his prior JP drain on the right side of his abdomen. On evaluation on day of discharge, it did not seem fluctuant or to have an underlying fluid collection, however was erythematous with a superficial abrasion present. There was no drainage from this. He was afebrile and without signs of systemic infection.  Discharge Subjective: reports feeling well today. No complaints. Denies nausea or vomiting. He is tolerating fluids well.  Discharge Vitals:   BP 116/83 (BP Location: Left Arm)   Pulse 97   Temp 97.9 F (36.6 C) (Oral)   Resp 18   Ht 5\' 11"  (1.803 m)   Wt 60.1 kg   SpO2 97%   BMI 18.48 kg/m   General: chronically ill appearing, in NAD GI: mild abdominal distension. Not tender to palpation. Stoma pink. Small volume liquid stool in ostomy bag.  Skin: small area of erythema around previous JP site. No drainage or pain on palpation. No appreciable fluid collection or fluctuance.   Pertinent Labs, Studies, and Procedures:  CBC Latest Ref Rng & Units 06/21/2020 06/18/2020 06/14/2020  WBC 4.0 - 10.5 K/uL 5.0 5.8 8.2  Hemoglobin 13.0 - 17.0 g/dL 8.8(L) 8.5(L) 8.5(L)  Hematocrit 39.0 - 52.0 % 29.1(L) 28.4(L) 28.2(L)  Platelets 150 - 400 K/uL 355 364 378   CMP Latest Ref Rng & Units 06/20/2020 06/19/2020 06/18/2020  Glucose 70 - 99 mg/dL 103(H) 110(H) 121(H)  BUN 8 - 23 mg/dL 9 10 12   Creatinine 0.61 - 1.24 mg/dL 0.41(L) 0.36(L) 0.33(L)  Sodium 135 - 145 mmol/L 134(L) 134(L) 134(L)  Potassium 3.5 -  5.1 mmol/L 4.3 4.1 3.8  Chloride 98 - 111 mmol/L 101 104 104  CO2 22 - 32 mmol/L 27 27 24   Calcium 8.9 - 10.3 mg/dL 8.2(L) 7.8(L) 7.6(L)  Total Protein 6.5 - 8.1 g/dL - - 5.1(L)  Total Bilirubin 0.3 - 1.2 mg/dL - - 0.3  Alkaline Phos 38 - 126 U/L - - 93  AST 15 - 41 U/L - - 19  ALT 0 - 44 U/L - -  18    CT HEAD WITHOUT CONTRAST,  CT CERVICAL SPINE WITHOUT CONTRAST Result Date: 05/25/2020 IMPRESSION: CT head: 1. No evidence of acute intracranial abnormality. 2. Mild-to-moderate cerebral atrophy. 3. Mild cerebral white matter chronic small vessel ischemic disease. 4. Mild paranasal sinus disease as described. CT cervical spine: 1. Mildly motion degraded exam. 2. No evidence of acute fracture to the cervical spine. 3. Nonspecific reversal of the expected cervical lordosis. 4. Mild grade 1 spondylolisthesis at C3-C4, C4-C5 and C5-C6 as detailed.   MR PELVIS WO CONTRAST Result Date: 05/28/2020 FINDINGS: TUMOR LOCATION Tumor distance from Anal Verge/Skin Surface:  16 cm Tumor distance to Internal Anal Sphincter: 10.4 cm TUMOR DESCRIPTION Circumferential Extent: Eccentric favoring the LEFT lateral rectosigmoid colon. Tumor Length: 4 cm cm T - CATEGORY Extension through Muscularis Propria: Yes T3 B with LEFT lateral extension of tumor that appears to extend 2-3 mm beyondw the wall of the colon/rectum. (Image 22, series 16) Shortest Distance of any tumor/node from Mesorectal Fascia: Tumor arises at or near the APR. Perhaps better classified as a sigmoid colon cancer rather than a rectal cancer based on position, the APR is faintly seen on image 7 of series 10. Tumor is just above the anterior peritoneal reflection. Extramural Vascular Invasion/Tumor Thrombus: No Invasion of Anterior Peritoneal Reflection: No Involvement of Adjacent Organs or Pelvic Sidewall: No Levator Ani Involvement: No N - CATEGORY Mesorectal Lymph Nodes >=48mm: None Extra-mesorectal Lymphadenopathy: None visualized on limited pelvic coverage.  Other: Heterogeneous marrow signal likely relates to reported chronic anemia. IMPRESSION: Findings of T3 B, N0, Mx colorectal cancer. Though tumor is more compatible with a sigmoid rather than rectal cancer based on position of tumor as compared to the anterior peritoneal reflection. Note that the exam shows limited assessment with respect to anatomic boundaries due to motion artifact. Electronically Signed   By: Zetta Bills M.D.   On: 05/28/2020 08:41     ECHOCARDIOGRAM COMPLETE Result Date: 05/27/2020 IMPRESSIONS  1. Left ventricular ejection fraction, by estimation, is 65 to 70%. The left ventricle has normal function. Left ventricular endocardial border not optimally defined to evaluate regional wall motion. Left ventricular diastolic parameters are consistent with Grade I diastolic dysfunction (impaired relaxation).  2. Right ventricular systolic function is hyperdynamic. The right ventricular size is normal.  3. The mitral valve is grossly normal. No evidence of mitral valve regurgitation.  4. The aortic valve is grossly normal. Aortic valve regurgitation is not visualized.  5. The inferior vena cava is dilated in size with >50% respiratory variability, suggesting right atrial pressure of 8 mmHg.   CT ANGIOGRAPHY CHEST, ABDOMEN AND PELVIS Result Date: 05/25/2020 IMPRESSION: 1. No acute abnormality in the chest, abdomen or pelvis. Specifically, negative for an acute aortic injury or dissection. 2. Main visceral arteries are patent. No evidence for mesenteric ischemia. No acute bowel abnormality. 3. Old bilateral rib fractures. 4. New small densities in the anterior right upper lobe near the right minor fissure. Suspect these are postinflammatory or atelectasis. 5.  Aortic Atherosclerosis (ICD10-I70.0). Electronically Signed   By: Markus Daft M.D.   On: 05/25/2020 15:29    SURGICAL PATHOLOGY  ** THIS IS AN ADDENDUM REPORT **  CASE: [id number]  PATIENT: Marc Mcdonald  Surgical Pathology Report    FINAL MICROSCOPIC DIAGNOSIS:   A. RECTOSIGMOID COLON, LOW ANTERIOR RESECTION:  - Invasive colonic adenocarcinoma, 4.6 cm.  - Tumor invades through the muscularis propria into pericolonic tissues.  - Margins of resection are not involved.  - Twenty-three  lymph nodes, negative for carcinoma (0/23).      Discharge Instructions: Discharge Instructions    Amb Referral to Ostomy Clinic   Complete by: As directed    Rectosigmoid colon cancer S/P robotic assisted low anterior resection with end colostomy 05/30/20   Reason for referral modifiers: Pre and post-operative counseling for ostomy management   Call MD for:  difficulty breathing, headache or visual disturbances   Complete by: As directed    Call MD for:  persistant dizziness or light-headedness   Complete by: As directed    Call MD for:  persistant nausea and vomiting   Complete by: As directed    Call MD for:  redness, tenderness, or signs of infection (pain, swelling, redness, odor or green/yellow discharge around incision site)   Complete by: As directed    Call MD for:  severe uncontrolled pain   Complete by: As directed    Call MD for:  temperature >100.4   Complete by: As directed    Discharge diet:   Complete by: As directed    FULL LIQUID   For home use only DME Walker rolling   Complete by: As directed    Rolling walker with 5'' wheels   Walker: With 5 Inch Wheels   Patient needs a walker to treat with the following condition: Colorectal cancer ([place])   No wound care   Complete by: As directed       Signed:  Mitzi Hansen, MD Internal Medicine Resident PGY-2 [name] Pager: [phone] 06/21/2020 1:23 PM

## 2020-06-01 NOTE — Progress Notes (Addendum)
HD#7 Subjective:   No acute events overnight.  Patient states he is doing "alright." Reports small output from ostomy. States the bag was changed by the ostomy team. Endorses worsening abdominal bloating. Reports an episode of emesis this morning, color was green. States that he felt much better after the emesis.  Discussed with patient to continue ambulating with physical therapy, which can help with his abdominal bloating.  Objective:  Vital signs in last 24 hours: Vitals:   05/31/20 0500 05/31/20 1531 05/31/20 2233 06/01/20 0507  BP:  123/83 (!) 139/97 127/83  Pulse:  93 89 96  Resp:   18 18  Temp:  97.9 F (36.6 C) 98 F (36.7 C) 99.3 F (37.4 C)  TempSrc:  Oral Oral Oral  SpO2:  98% 97% 96%  Weight: 47.7 kg   50.7 kg  Height:       Supplemental O2: Room Air SpO2: 96 % O2 Flow Rate (L/min): 6 L/min   Physical Exam:  Physical Exam Constitutional:      General: He is not in acute distress. HENT:     Head: Normocephalic.  Eyes:     General:        Right eye: No discharge.        Left eye: No discharge.  Cardiovascular:     Rate and Rhythm: Normal rate and regular rhythm.  Abdominal:     General: Bowel sounds are normal. There is distension.     Comments: Scant amount of red discharge seen in colostomy bag.  Surrounding skin appears noninfected  Neurological:     Mental Status: He is alert.  Psychiatric:        Mood and Affect: Mood normal.     Filed Weights   05/30/20 0600 05/31/20 0500 06/01/20 0507  Weight: 54.9 kg 47.7 kg 50.7 kg     Intake/Output Summary (Last 24 hours) at 06/01/2020 0741 Last data filed at 06/01/2020 0536 Gross per 24 hour  Intake 800 ml  Output 1765 ml  Net -965 ml   Net IO Since Admission: -2,867 mL [06/01/20 0741]  Pertinent Labs: CBC Latest Ref Rng & Units 06/01/2020 05/31/2020 05/30/2020  WBC 4.0 - 10.5 K/uL 6.3 10.4 6.3  Hemoglobin 13.0 - 17.0 g/dL 8.4(L) 8.8(L) 9.9(L)  Hematocrit 39.0 - 52.0 % 27.8(L) 28.7(L) 31.7(L)   Platelets 150 - 400 K/uL 288 269 275    CMP Latest Ref Rng & Units 06/01/2020 05/31/2020 05/30/2020  Glucose 70 - 99 mg/dL 106(H) 144(H) 94  BUN 8 - 23 mg/dL <5(L) <5(L) <5(L)  Creatinine 0.61 - 1.24 mg/dL 0.57(L) 0.52(L) 0.61  Sodium 135 - 145 mmol/L 133(L) 129(L) 127(L)  Potassium 3.5 - 5.1 mmol/L 3.4(L) 3.9 3.7  Chloride 98 - 111 mmol/L 103 102 97(L)  CO2 22 - 32 mmol/L 26 21(L) 22  Calcium 8.9 - 10.3 mg/dL 8.0(L) 7.8(L) 8.3(L)  Total Protein 6.5 - 8.1 g/dL 5.1(L) 5.3(L) 5.9(L)  Total Bilirubin 0.3 - 1.2 mg/dL 0.9 0.9 1.6(H)  Alkaline Phos 38 - 126 U/L 51 53 71  AST 15 - 41 U/L 15 16 14(L)  ALT 0 - 44 U/L 11 10 13     Imaging: No results found.  Assessment/Plan:   Active Problems:   Symptomatic anemia   Iron deficiency anemia due to chronic blood loss   Duodenal ulcer   Gastritis and gastroduodenitis   Gastroesophageal reflux disease with esophagitis without hemorrhage   Esophageal stricture   Rectal mass   Protein-calorie malnutrition, severe  Patient Summary: Patient is 65 yo gentlemanwith past medical history of questionable COPD,past left5,6,8ribs fracture who presented to the ED for dizziness, dyspnea with exertion,likely due to symptomatic anemia, found to have duodenal ulcer and acolorectal mass.  Colorectal mass(T3B, N0, Mx) Scant amount of red discharge in colostomy bag. Wound care is following and educating patient and family how to change the colostomy pouch on Monday. - Pain controlled with Tylenol, Percocet and morphine as needed.  Patient received 5 doses of Percocets and no morphine in the last 24 hours. - Clear liquid diet.  Advance as tolerated - f/u tissue pathology -Follow-up with oncology outpatienttodecide adjuvant chemotherapy based onsurgical pathology results.  Non-bleeding duodenal ulcer Grade B esophagitis without bleeding, gastritis s/p bx, moderate esophageal stenosis s/p dilation Likely in the context of excessive NSAID use,  heavy alcohol use and smoking - continue sucrafate - continue protonix 40mg  po BID x10w, then reduce to 40mg  daily - f/u gastric pathology  Iron deficiencyanemia2/2 GI bleed. Ganzoni equation calculated 1291 mg of total iron deficit using his initial Hgb of 5.5. Patient received 1 dose of Feraheme on 05/27/2020, which has 500 mg of iron. He also received 3u of pRBc which contains 600 mg total. He is only short 120 mg of iron.  - Can give additional Feraheme outpatient. -Daily CBC. Transfusion goal <7  Alcoholuse disorder -Continue thiamine and multivitamin  New lung densitieswhich are questioned to be post inflammatory vs atelectasis.   Diet: clear diet VTE: SCDs Code: Full Dispo:Pending PT/OT recs  Gaylan Gerold, DO 06/01/2020, 7:41 AM Pager: [phone]  Please contact the on call pager after 5 pm and on weekends at [phone].

## 2020-06-02 ENCOUNTER — Inpatient Hospital Stay (HOSPITAL_COMMUNITY): Payer: Medicare Other

## 2020-06-02 LAB — BASIC METABOLIC PANEL
Anion gap: 5 (ref 5–15)
BUN: <5 mg/dL — ABNORMAL LOW (ref 8–23)
CO2: 24 mmol/L (ref 22–32)
Calcium: 8.1 mg/dL — ABNORMAL LOW (ref 8.9–10.3)
Chloride: 101 mmol/L (ref 98–111)
Creatinine, Ser: 0.5 mg/dL — ABNORMAL LOW (ref 0.61–1.24)
GFR, Estimated: >60 mL/min (ref >60)
Glucose, Bld: 106 mg/dL — ABNORMAL HIGH (ref 70–99)
Potassium: 4 mmol/L (ref 3.5–5.1)
Sodium: 130 mmol/L — ABNORMAL LOW (ref 135–145)

## 2020-06-02 LAB — CBC
HCT: 30.9 % — ABNORMAL LOW (ref 39.0–52.0)
Hemoglobin: 9.4 g/dL — ABNORMAL LOW (ref 13.0–17.0)
MCH: 26 pg (ref 26.0–34.0)
MCHC: 30.4 g/dL (ref 30.0–36.0)
MCV: 85.4 fL (ref 80.0–100.0)
Platelets: 310 10*3/uL (ref 150–400)
RBC: 3.62 MIL/uL — ABNORMAL LOW (ref 4.22–5.81)
RDW: 24.9 % — ABNORMAL HIGH (ref 11.5–15.5)
WBC: 7.1 10*3/uL (ref 4.0–10.5)
nRBC: 0 % (ref 0.0–0.2)

## 2020-06-02 IMAGING — DX DG ABD PORTABLE 1V
2 series · 2 of 2 positions shown · non-contrast
Comparison: Preoperative CTA abdomen and pelvis [DATE].

CLINICAL DATA: Abdominal distension post robotic assisted lower
anterior colonic resection with end colostomy creation on
[DATE].

EXAM:
PORTABLE ABDOMEN - 1 VIEW

[abdomen kub (1 of 2)]
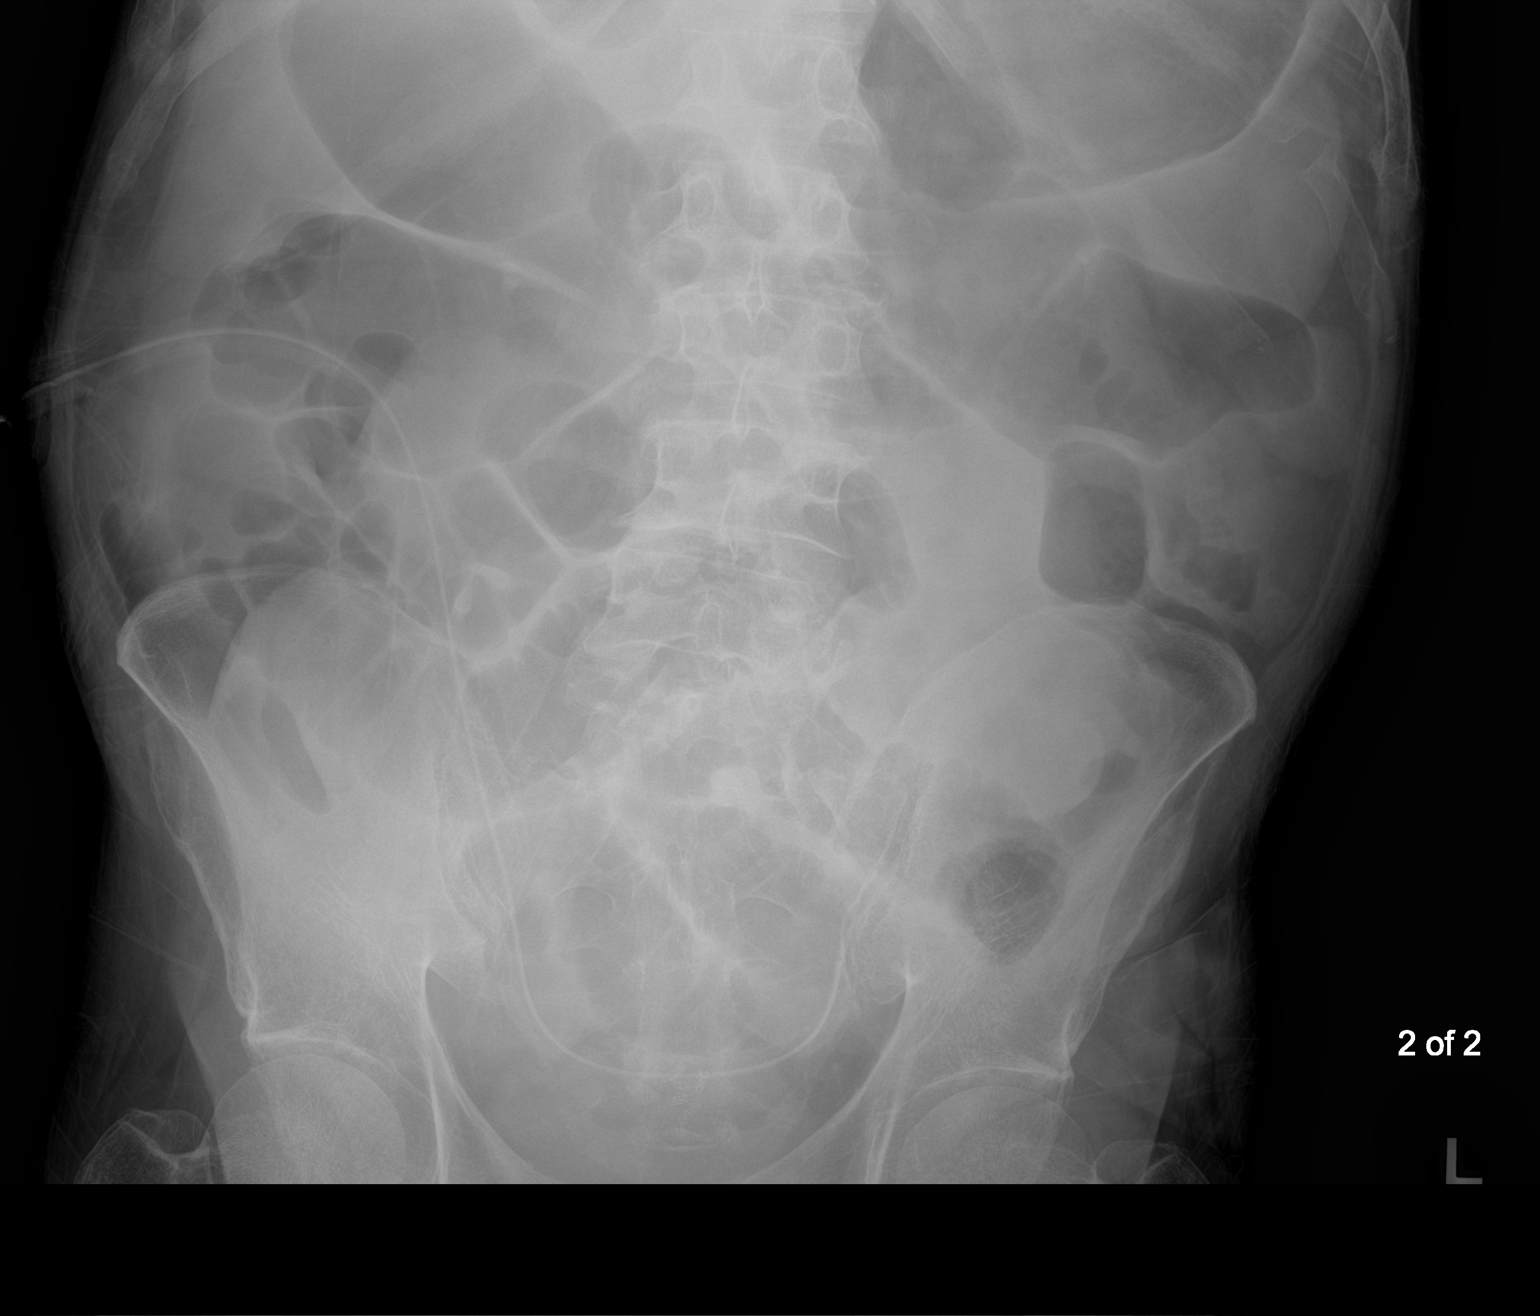

[abdomen kub (2 of 2)]
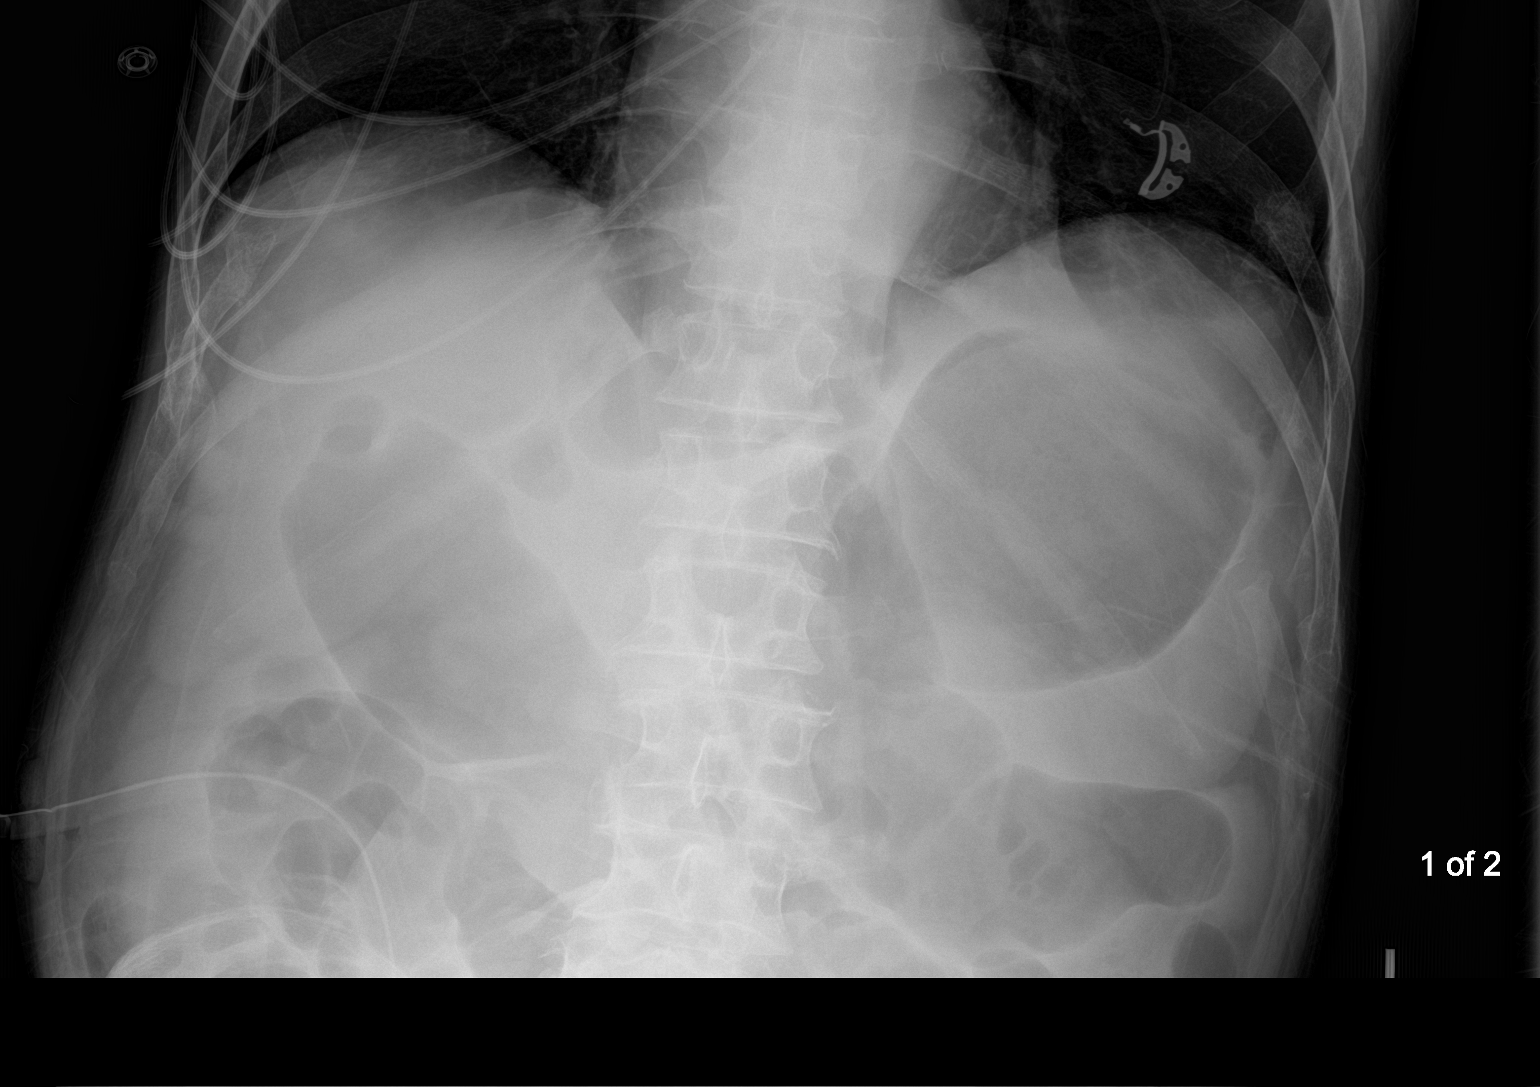

[2 of 2 positions shown; findings below may reference images not displayed]

FINDINGS: Moderate gaseous distension of multiple loops of small bowel
throughout the abdomen. Moderate gaseous distension of the stomach.
No suggestion of free air on the supine image. Surgical drain in the
pelvis.
IMPRESSION: Moderate small bowel and gastric distention which likely indicates
postoperative ileus.

## 2020-06-02 MED ORDER — LACTATED RINGERS IV SOLN: INTRAVENOUS | Status: AC

## 2020-06-02 NOTE — Progress Notes (Signed)
Was verbally ordered to hold off on inserting NG tube due to fresh ostomy surgery. Internal medicine will notify nurse when they are sure about insertion and have spoke to patient about it as well.

## 2020-06-02 NOTE — Progress Notes (Addendum)
     Assessment & Plan: POD#3 - S/P robotic assisted LAR with end colostomy 05/30/20 Dr. Dema Severin - ileus picture this AM with emesis - change to NPO with sips, chips; may need NG - drain with SS output, continue to monitor  - appreciate WOC assistance with colostomy teaching - encouraged OOB, ambulation  Path - T3 N0 adenocarcinoma  COPD Non-bleeding duodenal ulcer  Grade B esophogitis, gastritis, esophageal stenosis s/p dilation 4/2 Grade II internal hemorrhoids Iron deficiency anemia  Alcohol use disorder Indirect hyperbilirubinemia History of tobacco use   FEN: NPO, IVF VTE: SCDs ID: neomycin/flagyl 4/5, cefotetan 4/6 Foley: removed POD1        [name], MD       [facility]       Office: [phone]   Chief Complaint: GI bleed, rectal mass  Subjective: Patient in bed, emesis this AM.  "Wore out"  Objective: Vital signs in last 24 hours: Temp:  [98.2 F (36.8 C)-99.4 F (37.4 C)] 99.4 F (37.4 C) (04/09 0613) Pulse Rate:  [88-108] 108 (04/09 0613) Resp:  [18] 18 (04/09 0613) BP: (123-145)/(84-91) 123/86 (04/09 0613) SpO2:  [96 %-97 %] 97 % (04/09 3491) Weight:  [50.7 kg] 50.7 kg (04/09 0500) Last BM Date: 05/30/20  Intake/Output from previous day: 04/08 0701 - 04/09 0700 In: 1350 [P.O.:600; I.V.:750] Out: 500 [Urine:200; Drains:270; Stool:30] Intake/Output this shift: No intake/output data recorded.  Physical Exam: HEENT - sclerae clear, mucous membranes moist Neck - soft Abdomen - protuberant; quiet; stoma viable, little in ostomy bag; wounds dry and intact Neuro - alert & oriented, no focal deficits  Lab Results:  Recent Labs    06/01/20 0232 06/02/20 0048  WBC 6.3 7.1  HGB 8.4* 9.4*  HCT 27.8* 30.9*  PLT 288 310   BMET Recent Labs    06/01/20 0232 06/02/20 0048  NA 133* 130*  K 3.4* 4.0  CL 103 101  CO2 26 24  GLUCOSE 106* 106*  BUN <5* <5*  CREATININE 0.57* 0.50*  CALCIUM 8.0* 8.1*   PT/INR No results  for input(s): LABPROT, INR in the last 72 hours. Comprehensive Metabolic Panel:    Component Value Date/Time   NA 130 (L) 06/02/2020 0048   NA 133 (L) 06/01/2020 0232   K 4.0 06/02/2020 0048   K 3.4 (L) 06/01/2020 0232   CL 101 06/02/2020 0048   CL 103 06/01/2020 0232   CO2 24 06/02/2020 0048   CO2 26 06/01/2020 0232   BUN <5 (L) 06/02/2020 0048   BUN <5 (L) 06/01/2020 0232   CREATININE 0.50 (L) 06/02/2020 0048   CREATININE 0.57 (L) 06/01/2020 0232   GLUCOSE 106 (H) 06/02/2020 0048   GLUCOSE 106 (H) 06/01/2020 0232   CALCIUM 8.1 (L) 06/02/2020 0048   CALCIUM 8.0 (L) 06/01/2020 0232   AST 15 06/01/2020 0232   AST 16 05/31/2020 0241   ALT 11 06/01/2020 0232   ALT 10 05/31/2020 0241   ALKPHOS 51 06/01/2020 0232   ALKPHOS 53 05/31/2020 0241   BILITOT 0.9 06/01/2020 0232   BILITOT 0.9 05/31/2020 0241   PROT 5.1 (L) 06/01/2020 0232   PROT 5.3 (L) 05/31/2020 0241   ALBUMIN 2.0 (L) 06/01/2020 0232   ALBUMIN 2.1 (L) 05/31/2020 0241    Studies/Results: No results found.    [name] 06/02/2020  Patient ID: Marc Mcdonald, male   DOB: 10-23-1955, 65 y.o.   MRN: [id number]

## 2020-06-02 NOTE — Plan of Care (Signed)

## 2020-06-02 NOTE — Progress Notes (Addendum)
HD#8 Subjective:  Overnight Events: none  Patient is seen at bedside.  He reports multiple episodes of emesis overnight. Patient could not remember if the emesis started after his dinner. Emesis is bilious and non-bloody. Endorses persistent abdominal distention. Endorses epigastric pain. States that he threw up his medications yesterday.   States that he was able to walk around the floor yesterday.   Objective:  Vital signs in last 24 hours: Vitals:   06/01/20 1225 06/01/20 2100 06/02/20 0500 06/02/20 0613  BP: (!) 145/84 (!) 126/91  123/86  Pulse: 99 88  (!) 108  Resp:  18  18  Temp: 98.2 F (36.8 C) 98.5 F (36.9 C)  99.4 F (37.4 C)  TempSrc:    Oral  SpO2: 96% 96%  97%  Weight:   50.7 kg   Height:       Supplemental O2: Room Air SpO2: 97 % O2 Flow Rate (L/min): 6 L/min   Physical Exam:  Physical Exam Constitutional:      General: He is not in acute distress. HENT:     Head: Normocephalic.  Eyes:     General:        Right eye: No discharge.        Left eye: No discharge.  Cardiovascular:     Rate and Rhythm: Normal rate and regular rhythm.  Pulmonary:     Effort: Pulmonary effort is normal. No respiratory distress.  Abdominal:     General: There is distension.     Tenderness: There is no abdominal tenderness.     Comments: Could not appreciate bowel sound.   Musculoskeletal:     Right lower leg: No edema.     Left lower leg: No edema.  Skin:    General: Skin is warm.  Neurological:     Mental Status: He is alert.  Psychiatric:        Mood and Affect: Mood normal.     Filed Weights   05/31/20 0500 06/01/20 0507 06/02/20 0500  Weight: 47.7 kg 50.7 kg 50.7 kg     Intake/Output Summary (Last 24 hours) at 06/02/2020 0830 Last data filed at 06/02/2020 0500 Gross per 24 hour  Intake 1350 ml  Output 480 ml  Net 870 ml   Net IO Since Admission: -2,017 mL [06/02/20 0830]  Pertinent Labs: CBC Latest Ref Rng & Units 06/02/2020 06/01/2020 05/31/2020  WBC  4.0 - 10.5 K/uL 7.1 6.3 10.4  Hemoglobin 13.0 - 17.0 g/dL 9.4(L) 8.4(L) 8.8(L)  Hematocrit 39.0 - 52.0 % 30.9(L) 27.8(L) 28.7(L)  Platelets 150 - 400 K/uL 310 288 269    CMP Latest Ref Rng & Units 06/02/2020 06/01/2020 05/31/2020  Glucose 70 - 99 mg/dL 106(H) 106(H) 144(H)  BUN 8 - 23 mg/dL <5(L) <5(L) <5(L)  Creatinine 0.61 - 1.24 mg/dL 0.50(L) 0.57(L) 0.52(L)  Sodium 135 - 145 mmol/L 130(L) 133(L) 129(L)  Potassium 3.5 - 5.1 mmol/L 4.0 3.4(L) 3.9  Chloride 98 - 111 mmol/L 101 103 102  CO2 22 - 32 mmol/L 24 26 21(L)  Calcium 8.9 - 10.3 mg/dL 8.1(L) 8.0(L) 7.8(L)  Total Protein 6.5 - 8.1 g/dL - 5.1(L) 5.3(L)  Total Bilirubin 0.3 - 1.2 mg/dL - 0.9 0.9  Alkaline Phos 38 - 126 U/L - 51 53  AST 15 - 41 U/L - 15 16  ALT 0 - 44 U/L - 11 10    Imaging: No results found.  Assessment/Plan:   Active Problems:   Symptomatic anemia   Iron deficiency  anemia due to chronic blood loss   Duodenal ulcer   Gastritis and gastroduodenitis   Gastroesophageal reflux disease with esophagitis without hemorrhage   Esophageal stricture   Rectal mass   Protein-calorie malnutrition, severe   Aortic atherosclerosis ([place])   Patient Summary: Patient is 65 yo gentlemanwith past medical history of questionable COPD,past left5,6,8ribs fracture who presented to the ED for dizziness, dyspnea with exertion,likely due to symptomatic anemia, found to have duodenal ulcer and acolorectal mass.  Colorectal mass(T3B, N0, Mx) Possible ileus post op Patient has multiple episode of emesis with persistent abdominal distention. Concern for ileus picture in the setting of post-op. I talked to surgery this morning and they recommended making patient NPO at this time. KUB was ordered by surgery. If confirms ileus, patient may need NG tube.  -Pain controlled with Tylenol, Percocet and morphine as needed.  Patient received 3 doses of Percocets and 1 morphine dose this AM. -NPO for now - Pending KUB - f/u tissue  pathology -Follow-up with oncology outpatienttodecide adjuvant chemotherapy based onsurgical pathology results.  Non-bleeding duodenal ulcer Grade B esophagitis without bleeding, gastritis s/p bx, moderate esophageal stenosis s/p dilation Likely in the context of excessive NSAID use, heavy alcohol use and smoking - continue sucrafate - continue protonix 40mg  po BID x10w, then reduce to 40mg  daily - gastric pathology negative for H. Pylori  Iron deficiencyanemia2/2 GI bleed. Hgb improves to 9.4. Patient received 1 does of Feraheme.  - Can give additional Feraheme outpatient. -Daily CBC. Transfusion goal <7  Alcoholuse disorder -Continue thiamine and multivitamin  New lung densitieswhich are questioned to be post inflammatory vs atelectasis.   Diet: NPO VTE: SCDs Code: Full Dispo:PendingColostomy education for family   Gaylan Gerold, DO 06/02/2020, 8:30 AM Pager: [phone]  Please contact the on call pager after 5 pm and on weekends at [phone].

## 2020-06-03 DIAGNOSIS — C19 Malignant neoplasm of rectosigmoid junction: Secondary | ICD-10-CM | POA: Diagnosis present

## 2020-06-03 DIAGNOSIS — K9189 Other postprocedural complications and disorders of digestive system: Secondary | ICD-10-CM

## 2020-06-03 DIAGNOSIS — K567 Ileus, unspecified: Secondary | ICD-10-CM

## 2020-06-03 LAB — BASIC METABOLIC PANEL
Anion gap: 10 (ref 5–15)
BUN: 6 mg/dL — ABNORMAL LOW (ref 8–23)
CO2: 20 mmol/L — ABNORMAL LOW (ref 22–32)
Calcium: 8.2 mg/dL — ABNORMAL LOW (ref 8.9–10.3)
Chloride: 100 mmol/L (ref 98–111)
Creatinine, Ser: 0.63 mg/dL (ref 0.61–1.24)
GFR, Estimated: >60 mL/min (ref >60)
Glucose, Bld: 121 mg/dL — ABNORMAL HIGH (ref 70–99)
Potassium: 4.4 mmol/L (ref 3.5–5.1)
Sodium: 130 mmol/L — ABNORMAL LOW (ref 135–145)

## 2020-06-03 LAB — CBC
HCT: 34.1 % — ABNORMAL LOW (ref 39.0–52.0)
Hemoglobin: 10.4 g/dL — ABNORMAL LOW (ref 13.0–17.0)
MCH: 26.3 pg (ref 26.0–34.0)
MCHC: 30.5 g/dL (ref 30.0–36.0)
MCV: 86.3 fL (ref 80.0–100.0)
Platelets: 404 10*3/uL — ABNORMAL HIGH (ref 150–400)
RBC: 3.95 MIL/uL — ABNORMAL LOW (ref 4.22–5.81)
RDW: 25 % — ABNORMAL HIGH (ref 11.5–15.5)
WBC: 13.9 10*3/uL — ABNORMAL HIGH (ref 4.0–10.5)
nRBC: 0 % (ref 0.0–0.2)

## 2020-06-03 MED ORDER — LACTATED RINGERS IV SOLN: INTRAVENOUS | Status: AC

## 2020-06-03 NOTE — Progress Notes (Signed)
Assessment & Plan: POD#4 - S/P robotic assisted LAR with end colostomy 05/30/20 Dr. Dema Severin - ileus on AXR, clinically - NPO, IVF - held off on NG with no further emesis overnight - drain withSSoutput, continue to monitor  - Hgb 10.4 - appreciate WOC assistance with colostomy teaching - encouraged OOB, ambulation - son coming today to assist  Path = T3N0 adenocarcinoma  COPD Non-bleeding duodenal ulcer  Grade B esophogitis, gastritis, esophageal stenosis s/p dilation 4/2 Grade II internal hemorrhoids Iron deficiency anemia  Alcohol use disorder Indirect hyperbilirubinemia History of tobacco use   FEN: NPO, IVF VTE: SCDs ID: neomycin/flagyl 4/5, cefotetan 4/6 Foley: removed POD1        [name], MD       [facility]       Office: [phone]   Chief Complaint: Rectal mass, bleeding  Subjective: Patient in bed, appears comfortable.  No emesis overnight.  Plans to ambulate today in halls with son.  Objective: Vital signs in last 24 hours: Temp:  [97.7 F (36.5 C)-98.2 F (36.8 C)] 97.7 F (36.5 C) (04/10 0520) Pulse Rate:  [98-108] 98 (04/10 0520) Resp:  [18] 18 (04/10 0520) BP: (129-133)/(88-92) 129/92 (04/10 0520) SpO2:  [97 %-98 %] 97 % (04/10 0520) Last BM Date: 06/02/20  Intake/Output from previous day: 04/09 0701 - 04/10 0700 In: 0  Out: 970 [Urine:250; Emesis/NG output:100; Drains:620] Intake/Output this shift: No intake/output data recorded.  Physical Exam: HEENT - sclerae clear, mucous membranes moist Neck - soft Chest - clear bilaterally Cor - RRR Abdomen - softer, mild distension; quiet; minimal liquid in ostomy bag; stoma viable Ext - no edema, non-tender Neuro - alert & oriented, no focal deficits  Lab Results:  Recent Labs    06/02/20 0048 06/03/20 0051  WBC 7.1 13.9*  HGB 9.4* 10.4*  HCT 30.9* 34.1*  PLT 310 404*   BMET Recent Labs    06/02/20 0048 06/03/20 0051  NA 130* 130*  K 4.0 4.4  CL 101  100  CO2 24 20*  GLUCOSE 106* 121*  BUN <5* 6*  CREATININE 0.50* 0.63  CALCIUM 8.1* 8.2*   PT/INR No results for input(s): LABPROT, INR in the last 72 hours. Comprehensive Metabolic Panel:    Component Value Date/Time   NA 130 (L) 06/03/2020 0051   NA 130 (L) 06/02/2020 0048   K 4.4 06/03/2020 0051   K 4.0 06/02/2020 0048   CL 100 06/03/2020 0051   CL 101 06/02/2020 0048   CO2 20 (L) 06/03/2020 0051   CO2 24 06/02/2020 0048   BUN 6 (L) 06/03/2020 0051   BUN <5 (L) 06/02/2020 0048   CREATININE 0.63 06/03/2020 0051   CREATININE 0.50 (L) 06/02/2020 0048   GLUCOSE 121 (H) 06/03/2020 0051   GLUCOSE 106 (H) 06/02/2020 0048   CALCIUM 8.2 (L) 06/03/2020 0051   CALCIUM 8.1 (L) 06/02/2020 0048   AST 15 06/01/2020 0232   AST 16 05/31/2020 0241   ALT 11 06/01/2020 0232   ALT 10 05/31/2020 0241   ALKPHOS 51 06/01/2020 0232   ALKPHOS 53 05/31/2020 0241   BILITOT 0.9 06/01/2020 0232   BILITOT 0.9 05/31/2020 0241   PROT 5.1 (L) 06/01/2020 0232   PROT 5.3 (L) 05/31/2020 0241   ALBUMIN 2.0 (L) 06/01/2020 0232   ALBUMIN 2.1 (L) 05/31/2020 0241    Studies/Results: DG Abd Portable 1V  Result Date: 06/02/2020 CLINICAL DATA:  Abdominal distension post robotic assisted lower anterior colonic resection with  end colostomy creation on 05/30/2020. EXAM: PORTABLE ABDOMEN - 1 VIEW COMPARISON:  Preoperative CTA abdomen and pelvis 05/25/2020. FINDINGS: Moderate gaseous distension of multiple loops of small bowel throughout the abdomen. Moderate gaseous distension of the stomach. No suggestion of free air on the supine image. Surgical drain in the pelvis. IMPRESSION: Moderate small bowel and gastric distention which likely indicates postoperative ileus. Electronically Signed   By: Evangeline Dakin M.D.   On: 06/02/2020 10:38      [name] 06/03/2020  Patient ID: [name], male   DOB: 10-13-1955, 65 y.o.   MRN: [id number]

## 2020-06-03 NOTE — Progress Notes (Signed)
HD#9 Subjective:  Overnight Events: NA  Patient is seen at bedside.  He appears comfortable in no acute distress.  He denies any further episodes of emesis overnight.  He states that he started to feel his bowels move.  Objective:  Vital signs in last 24 hours: Vitals:   06/02/20 0500 06/02/20 0613 06/02/20 2010 06/03/20 0520  BP:  123/86 133/88 (!) 129/92  Pulse:  (!) 108 (!) 108 98  Resp:  18 18 18   Temp:  99.4 F (37.4 C) 98.2 F (36.8 C) 97.7 F (36.5 C)  TempSrc:  Oral Oral Oral  SpO2:  97% 98% 97%  Weight: 50.7 kg     Height:       Supplemental O2: Room Air SpO2: 97 % O2 Flow Rate (L/min): 6 L/min   Physical Exam:  Physical Exam Constitutional:      General: He is not in acute distress. HENT:     Head: Normocephalic.  Eyes:     General:        Right eye: No discharge.        Left eye: No discharge.  Abdominal:     Comments: Can appreciate a bowel sounds today.  Abdominal distention has improved.  Pain to palpation of the right lower quadrant.  There is only a small amount of output from colostomy.   Skin:    General: Skin is warm.  Neurological:     Mental Status: He is alert.  Psychiatric:        Mood and Affect: Mood normal.     Filed Weights   05/31/20 0500 06/01/20 0507 06/02/20 0500  Weight: 47.7 kg 50.7 kg 50.7 kg     Intake/Output Summary (Last 24 hours) at 06/03/2020 1122 Last data filed at 06/03/2020 1027 Gross per 24 hour  Intake 0 ml  Output 845 ml  Net -845 ml   Net IO Since Admission: -3,062 mL [06/03/20 1122]  Pertinent Labs: CBC Latest Ref Rng & Units 06/03/2020 06/02/2020 06/01/2020  WBC 4.0 - 10.5 K/uL 13.9(H) 7.1 6.3  Hemoglobin 13.0 - 17.0 g/dL 10.4(L) 9.4(L) 8.4(L)  Hematocrit 39.0 - 52.0 % 34.1(L) 30.9(L) 27.8(L)  Platelets 150 - 400 K/uL 404(H) 310 288    CMP Latest Ref Rng & Units 06/03/2020 06/02/2020 06/01/2020  Glucose 70 - 99 mg/dL 121(H) 106(H) 106(H)  BUN 8 - 23 mg/dL 6(L) <5(L) <5(L)  Creatinine 0.61 - 1.24 mg/dL  0.63 0.50(L) 0.57(L)  Sodium 135 - 145 mmol/L 130(L) 130(L) 133(L)  Potassium 3.5 - 5.1 mmol/L 4.4 4.0 3.4(L)  Chloride 98 - 111 mmol/L 100 101 103  CO2 22 - 32 mmol/L 20(L) 24 26  Calcium 8.9 - 10.3 mg/dL 8.2(L) 8.1(L) 8.0(L)  Total Protein 6.5 - 8.1 g/dL - - 5.1(L)  Total Bilirubin 0.3 - 1.2 mg/dL - - 0.9  Alkaline Phos 38 - 126 U/L - - 51  AST 15 - 41 U/L - - 15  ALT 0 - 44 U/L - - 11    Imaging: No results found.  Assessment/Plan:   Principal Problem:   Primary colorectal adenocarcinoma ([place]) Active Problems:   Symptomatic anemia   Iron deficiency anemia due to chronic blood loss   Duodenal ulcer   Gastritis and gastroduodenitis   Gastroesophageal reflux disease with esophagitis without hemorrhage   Esophageal stricture   Protein-calorie malnutrition, severe   Aortic atherosclerosis ([place])   Patient Summary: Patient is 65 yo gentlemanwith past medical history of questionable COPD,past left5,6,8ribs fracture who presented  to the ED for dizziness, dyspnea with exertion,likely due to symptomatic anemia, found to have duodenal ulcer and acolorectal mass.  Colorectal mass(T3B, N0, Mx) Post-op Ileus His abdominal distention improved today. Can hear bowel sound. No further episodes of emesis over night. NG tube was held yesterday per patient's preference. Will continue NPO today for bowel rest. Continue LR to avoid dehydration. Patient asked if he can have some sips of water, will reach out to surgery for advice.  Surgical pathology came back invasive colonic adenocarcinoma, 4.6 cm. Margins of the resection are not involved.  All lymph nodes were negative for carcinoma. -Pain controlled with Tylenol, Percocet and morphine as needed.Patient received 2 morphine dose yesterday due to NPO status -NPO for now. Holding NG tube. - LR 100 cc/h -Follow-up with oncology outpatienttodecide adjuvant chemotherapy based onsurgical pathology results.  Non-bleeding duodenal  ulcer Grade B esophagitis without bleeding, gastritis s/p bx, moderate esophageal stenosis s/p dilation Likely in the context of excessive NSAID use, heavy alcohol use and smoking - continue sucrafate - continue protonix 40mg  po BID x10w, then reduce to 40mg  daily - gastric pathology negative for H. Pylori  Iron deficiencyanemia2/2 GI bleed. Hgb improves to 10.4. Patient received 1 does of Feraheme.  - Can give additional Feraheme outpatient. -Daily CBC. Transfusion goal <7  Alcoholuse disorder -Continue thiamine and multivitamin  New lung densitieswhich are questioned to be post inflammatory vs atelectasis.   Diet: NPO VTE: SCDs Code: Full Dispo:PendingColostomy education for family   Gaylan Gerold, DO 06/03/2020, 11:22 AM Pager: [phone]  Please contact the on call pager after 5 pm and on weekends at [phone].

## 2020-06-03 NOTE — Plan of Care (Signed)
  Problem: Education: Goal: Knowledge of General Education information will improve Description Including pain rating scale, medication(s)/side effects and non-pharmacologic comfort measures Outcome: Progressing   

## 2020-06-04 ENCOUNTER — Telehealth: Payer: Self-pay | Admitting: Hematology

## 2020-06-04 ENCOUNTER — Inpatient Hospital Stay (HOSPITAL_COMMUNITY): Payer: Medicare Other

## 2020-06-04 DIAGNOSIS — C19 Malignant neoplasm of rectosigmoid junction: Secondary | ICD-10-CM | POA: Diagnosis not present

## 2020-06-04 LAB — BASIC METABOLIC PANEL
Anion gap: 8 (ref 5–15)
BUN: 10 mg/dL (ref 8–23)
CO2: 22 mmol/L (ref 22–32)
Calcium: 8.1 mg/dL — ABNORMAL LOW (ref 8.9–10.3)
Chloride: 99 mmol/L (ref 98–111)
Creatinine, Ser: 0.68 mg/dL (ref 0.61–1.24)
GFR, Estimated: >60 mL/min (ref >60)
Glucose, Bld: 116 mg/dL — ABNORMAL HIGH (ref 70–99)
Potassium: 3.9 mmol/L (ref 3.5–5.1)
Sodium: 129 mmol/L — ABNORMAL LOW (ref 135–145)

## 2020-06-04 LAB — CBC
HCT: 33.3 % — ABNORMAL LOW (ref 39.0–52.0)
Hemoglobin: 10.3 g/dL — ABNORMAL LOW (ref 13.0–17.0)
MCH: 27 pg (ref 26.0–34.0)
MCHC: 30.9 g/dL (ref 30.0–36.0)
MCV: 87.4 fL (ref 80.0–100.0)
Platelets: 460 10*3/uL — ABNORMAL HIGH (ref 150–400)
RBC: 3.81 MIL/uL — ABNORMAL LOW (ref 4.22–5.81)
RDW: 25.2 % — ABNORMAL HIGH (ref 11.5–15.5)
WBC: 15.4 10*3/uL — ABNORMAL HIGH (ref 4.0–10.5)
nRBC: 0 % (ref 0.0–0.2)

## 2020-06-04 LAB — SURGICAL PATHOLOGY

## 2020-06-04 LAB — PREALBUMIN: Prealbumin: 8 mg/dL — ABNORMAL LOW (ref 18–38)

## 2020-06-04 IMAGING — CT CT ABD-PELV W/ CM
2 of 5 series · 16 of 46 positions shown, 18 images · IV contrast (APPLIED)
Comparison: [DATE]

CLINICAL DATA: Abdominal pain, fever, new diagnosis rectal cancer,
postoperative rectosigmoid colon resection and left lower quadrant
ostomy.

EXAM:
CT ABDOMEN AND PELVIS WITH CONTRAST
TECHNIQUE: Multidetector CT imaging of the abdomen and pelvis was performed
using the standard protocol following bolus administration of
intravenous contrast.
CONTRAST:  100mL OMNIPAQUE IOHEXOL 300 MG/ML  SOLN

[Series 3: abd/ pelvis 5.0 i30f 2 · axial · 0.66mm/px · z∈[+861,+1246]mm · 13 of 87 slices shown, 15 images]
[im 5/87  soft-tissue]
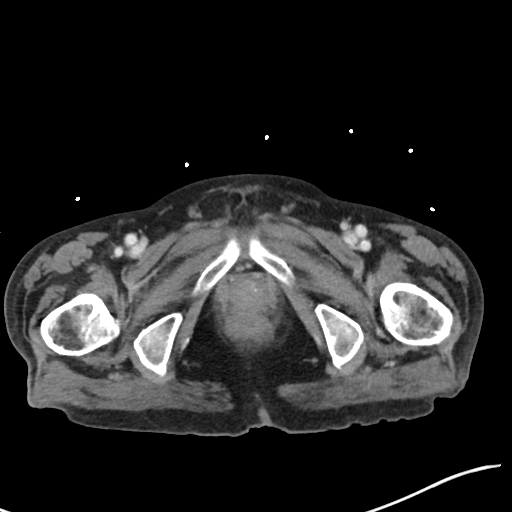
[im 5/87  bone]
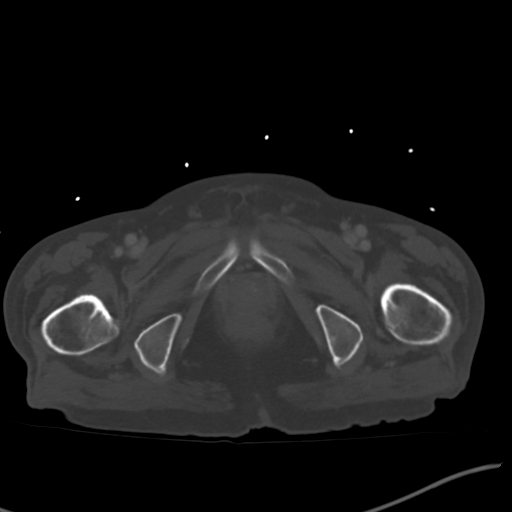
[im 14/87  soft-tissue]
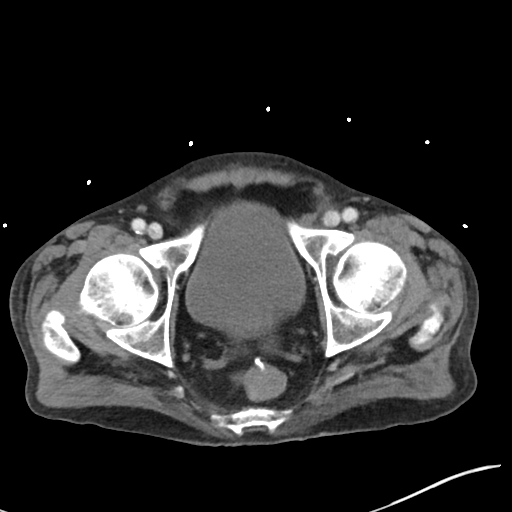
[im 19/87  soft-tissue]
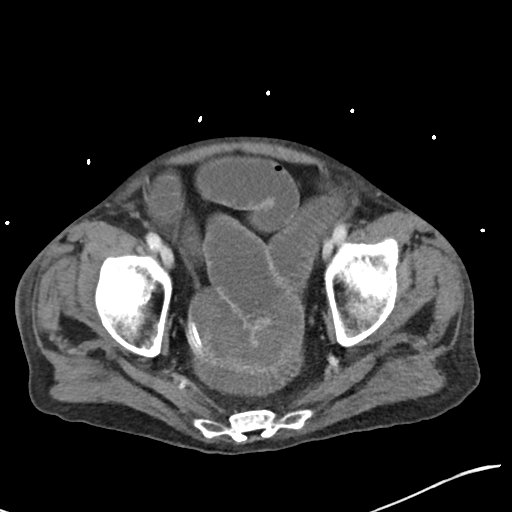
[im 23/87  soft-tissue]
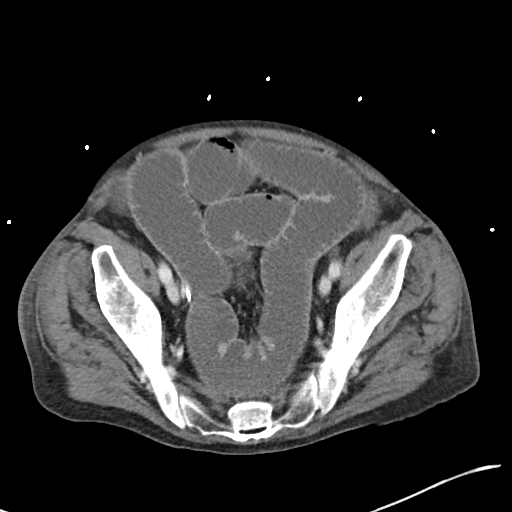
[im 32/87  soft-tissue]
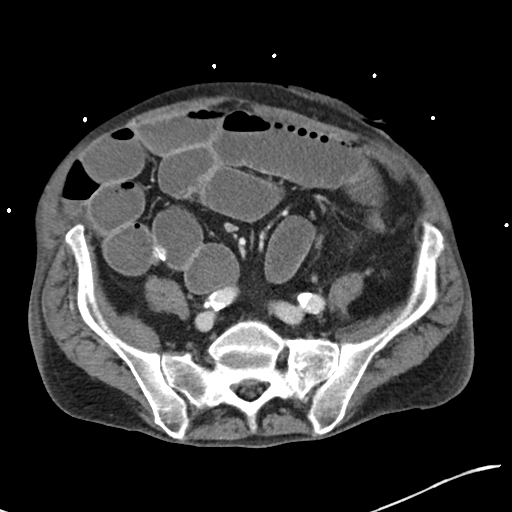
[im 37/87  soft-tissue]
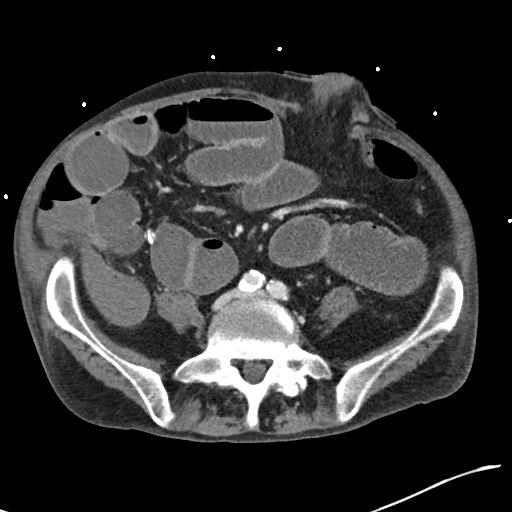
[im 46/87  soft-tissue]
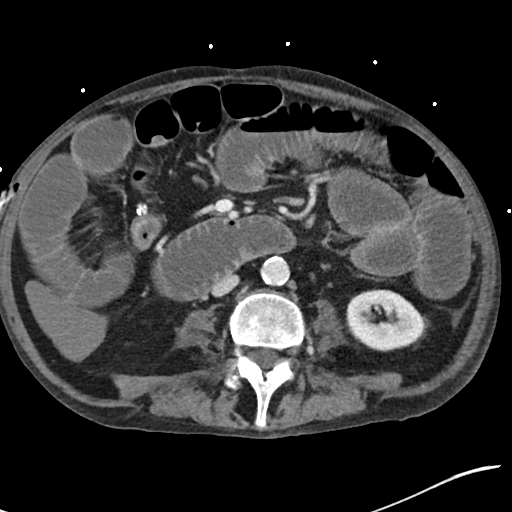
[im 50/87  soft-tissue]
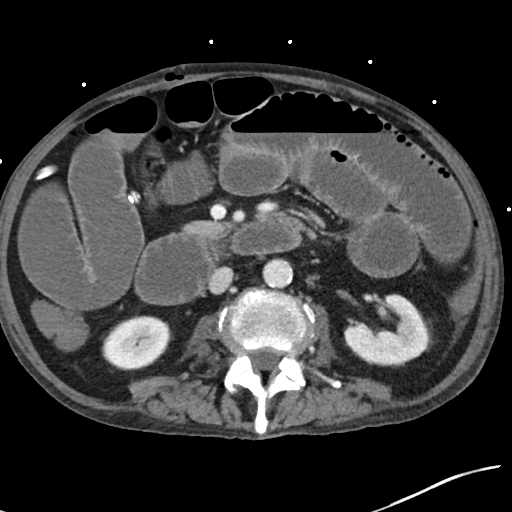
[im 55/87  soft-tissue]
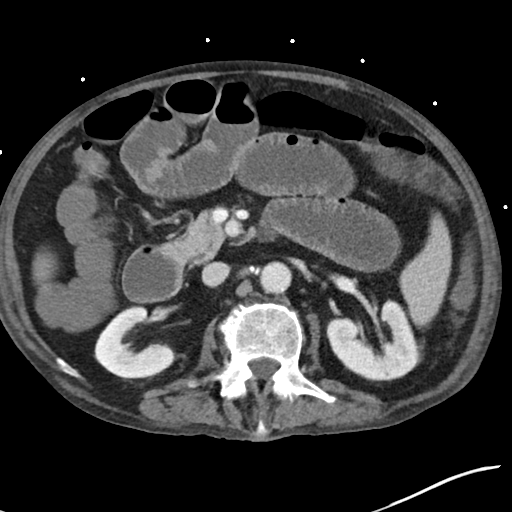
[im 55/87  bone]
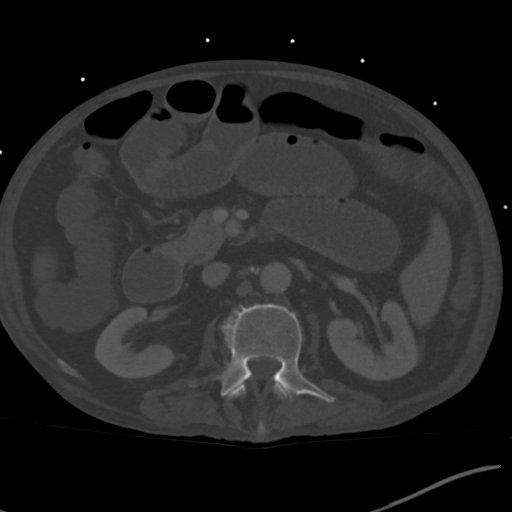
[im 64/87  soft-tissue]
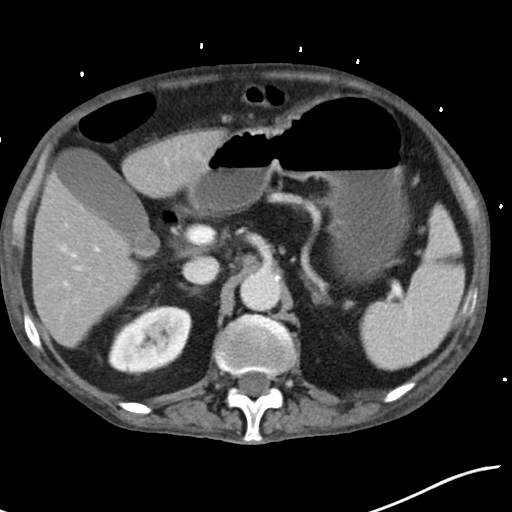
[im 68/87  soft-tissue]
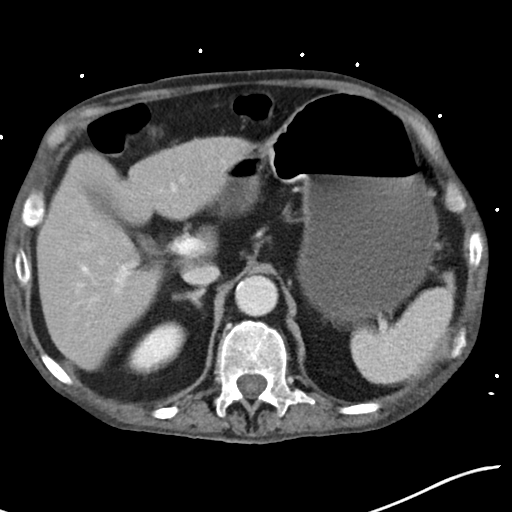
[im 73/87  soft-tissue]
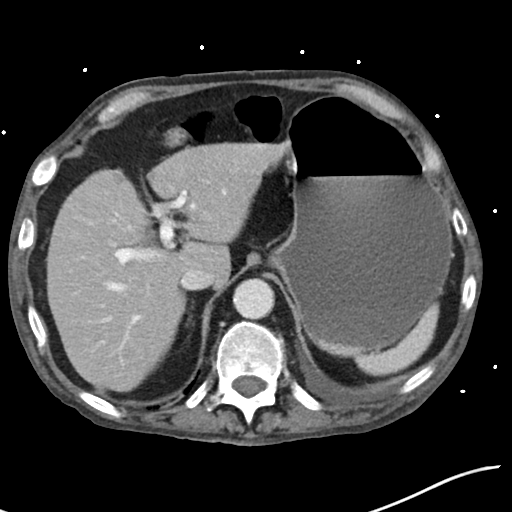
[im 82/87  soft-tissue]
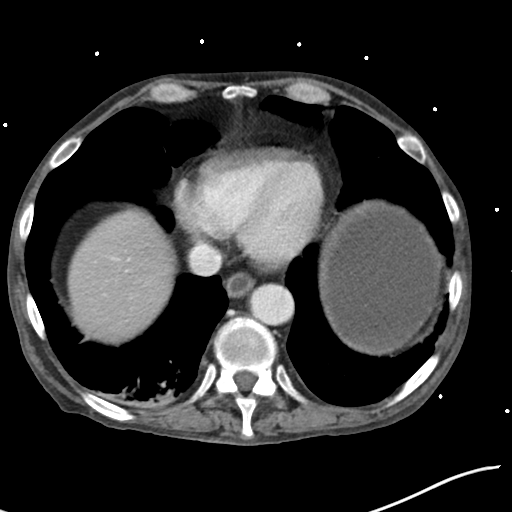

[Series 6: coronal soft tissue · coronal · 0.80mm/px · 3 of 101 slices shown]
[im 34/101  soft-tissue]
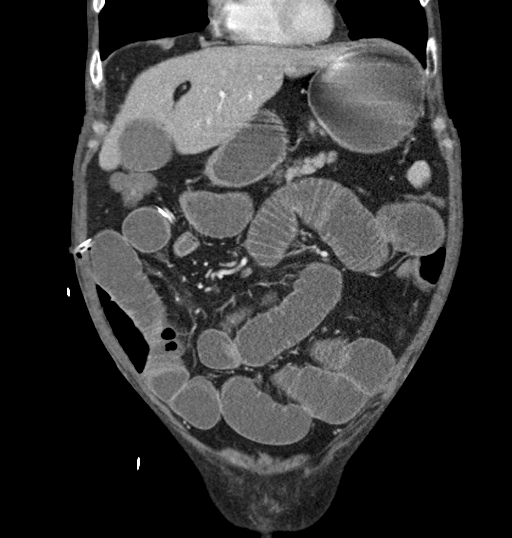
[im 45/101  soft-tissue]
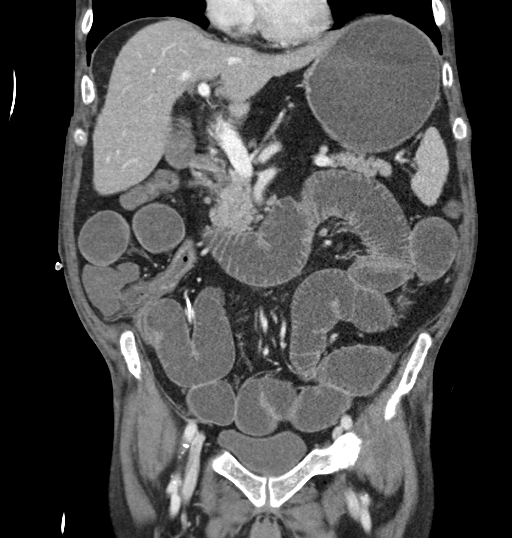
[im 56/101  soft-tissue]
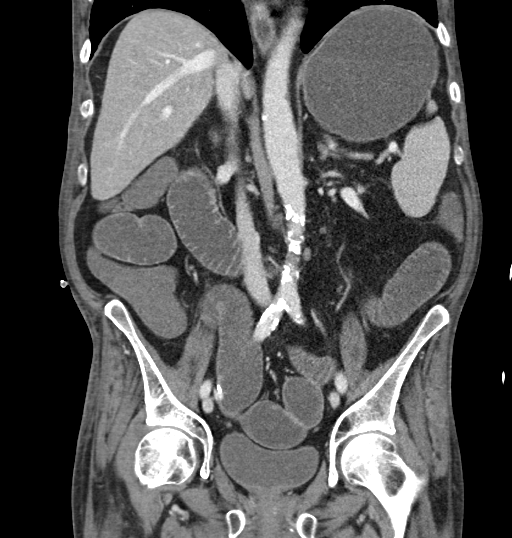

[16 of 46 positions shown; findings below may reference images not displayed]

FINDINGS: Lower chest: New atelectasis or consolidation of the right lung base
(series 5, image 8). New trace left pleural effusion.

Hepatobiliary: No solid liver abnormality is seen. Minimal sludge in
the dependent gallbladder (series 3, image 26). Gallbladder wall
thickening, or biliary dilatation.

Pancreas: Unremarkable. No pancreatic ductal dilatation or
surrounding inflammatory changes.

Spleen: Normal in size without significant abnormality.

Adrenals/Urinary Tract: Adrenal glands are unremarkable. Kidneys are
normal, without renal calculi, solid lesion, or hydronephrosis.
Bladder is unremarkable.

Stomach/Bowel: Stomach is within normal limits. Appendix appears
normal. Status post interval rectosigmoid colon resection with left
lower quadrant end ostomy and surgical drain in the low pelvis. The
stomach and small bowel are mildly distended and fluid-filled
throughout, largest loops measuring up to 3.8 cm.

Vascular/Lymphatic: Aortic atherosclerosis. No enlarged abdominal or
pelvic lymph nodes.

Reproductive: No mass or other significant abnormality.

Other: No abdominal wall hernia or abnormality. No abdominopelvic
ascites.

Musculoskeletal: No acute or significant osseous findings.
IMPRESSION: 1. Status post interval rectosigmoid colon resection with left lower
quadrant end ostomy and surgical drain in the low pelvis.
2. The stomach and small bowel are mildly distended and fluid-filled
throughout, largest loops measuring up to 3.8 cm. Findings are
consistent with postoperative ileus.
3. No evidence of postoperative fluid collection or other
complication.
4. New atelectasis or consolidation of the right lung base. New
trace left pleural effusion.

Aortic Atherosclerosis ([XQ]-[XQ]).

## 2020-06-04 MED ORDER — ACETAMINOPHEN 325 MG PO TABS: 650.0000 mg | ORAL_TABLET | Freq: Four times a day (QID) | ORAL | Status: DC | PRN

## 2020-06-04 MED ORDER — MORPHINE SULFATE (PF) 2 MG/ML IV SOLN
2.0000 mg | INTRAVENOUS | Status: DC | PRN
  Administered 2020-06-05 – 2020-06-10 (×6): 2 mg via INTRAVENOUS
  Filled 2020-06-04 (×7): qty 1

## 2020-06-04 MED ORDER — PANTOPRAZOLE SODIUM 40 MG IV SOLR
40.0000 mg | Freq: Two times a day (BID) | INTRAVENOUS | Status: DC
  Administered 2020-06-04 – 2020-06-09 (×11): 40 mg via INTRAVENOUS
  Filled 2020-06-04 (×11): qty 40

## 2020-06-04 MED ORDER — IOHEXOL 300 MG/ML  SOLN
100.0000 mL | Freq: Once | INTRAMUSCULAR | Status: AC | PRN
  Administered 2020-06-04: 100 mL via INTRAVENOUS

## 2020-06-04 MED ORDER — METHOCARBAMOL 1000 MG/10ML IJ SOLN
500.0000 mg | Freq: Three times a day (TID) | INTRAVENOUS | Status: DC
  Administered 2020-06-04 – 2020-06-10 (×18): 500 mg via INTRAVENOUS
  Filled 2020-06-04 (×11): qty 5
  Filled 2020-06-04: qty 500
  Filled 2020-06-04 (×8): qty 5

## 2020-06-04 NOTE — Progress Notes (Addendum)
Nutrition Follow-up  DOCUMENTATION CODES:   Severe malnutrition in context of chronic illness,Underweight  INTERVENTION:   Recommend initiation of TPN given prolonged NPO/CL status with inability to tolerate diet advancement in the presence of severe malnutrition. Discussed with Surgery, plan to make decision regarding TPN post CT abdomen today   NUTRITION DIAGNOSIS:   Inadequate oral intake related to poor appetite,social / environmental circumstances,altered GI function as evidenced by NPO status.  Continues   GOAL:   Patient will meet greater than or equal to 90% of their needs  Ongoing   MONITOR:   Diet advancement,PO intake,Labs,Weight trends  REASON FOR ASSESSMENT:   Consult Assessment of nutrition requirement/status  ASSESSMENT:   65 yo male admitted with symptomatic anemia, dyspnea on exertion, hyponatremia. PMH includes COPD, EtOH abuse (12-15 beers per day) and tobacco abuse (1 pack every other day)   Pt with nausea and 3 episodes of emesis in 24 hours  Pt now NPO again, plan for CT abdomen NPO/CL day 7  +flatus in colostomy bag, unsure of stool. Pt is burping and having hiccups.   Labs: sodium 129 (L) Meds: reviewed  Diet Order:   Diet Order            Diet NPO time specified Except for: Ice Chips, Sips with Meds  Diet effective now                 EDUCATION NEEDS:   Not appropriate for education at this time  Skin:  Skin Assessment: Reviewed RN Assessment  Last BM:  4/3 per pt - prior to procedures  Height:   Ht Readings from Last 1 Encounters:  05/26/20 5\' 11"  (1.803 m)    Weight:   Wt Readings from Last 1 Encounters:  06/04/20 52.5 kg    Ideal Body Weight:  78.2 kg  BMI:  Body mass index is 16.14 kg/m.  Estimated Nutritional Needs:   Kcal:  2000-2200 kcals  Protein:  100-115 g  Fluid:  >/= 2 L   Kerman Passey MS, RDN, LDN, CNSC Registered Dietitian III Clinical Nutrition RD Pager and On-Call Pager Number Located  in [place]

## 2020-06-04 NOTE — Consult Note (Signed)
[place] Nurse ostomy follow up Stoma type/location: LLQ colostomy  Stomal assessment/size: 3 cm x 5 cm remains oval, pink and moist  No stool, serosanguinous liquid only.  Peristomal assessment: bulging in superior peristomal area remains today.   Treatment options for stomal/peristomal skin: barrier ring Output stoma sweat only.  Ostomy pouching: 2pc. 2 3/4" pouch with barrier ring  Education provided: Son ([employer] education careers adviser) at bedside for first teaching session. We remove pouch and cleanse peristomal skin.  I discuss oval stoma and need for specialized pattern (not round)  He is able to trace pattern and cut barrier without difficult.  He snaps barrier and pouch together and applies barrier ring without difficulty.  I apply pouch and he observes this.  He is able to roll pouch closed and demonstrate emptying with my assist.  We discuss emptying when 1/3 full of stool, once he begins to make stool and changing pouch twice weekly and PRN leakage. He feels like he will be able to assist his father.  They live together.  We discuss a filtered pouch and patient and son feel this will be good for him after discharge and I will add this to his secure start kit order.  He is anxious to feel better. I have encouraged him to consider the NG tube as decompression will relieve many of the uncomfortable symptoms he is experiencing. I have also informed him that he must move to encourage motility.  He was out of bed to a chair yesterday and agrees to walk some today.  He is depressed and wanted to feel better by now. I offer emotional support and once again outline the steps he can take now to improve how he feels.  He will consider and let us know if he wants to move forward with the NG tube. His son will continue to talk to him as well.  We wlll meet again Wednesday AM for pouch change and teaching with son.  Enrolled patient in [place] Discharge program: Yes today Will follow.   Domenic Moras MSN, RN, FNP-BC  [facility] Wound, Ostomy, Continence Nurse Pager [phone]

## 2020-06-04 NOTE — Progress Notes (Signed)
HD#10 Subjective:  Overnight Events: Patient had 3 episodes of emesis overnight  Patient seen at bedside.  Appears comfortable in no acute distress.  States that he is feeling that his bowels are moving.  Patient continues to decline NG tube placement.  Objective:  Vital signs in last 24 hours: Vitals:   06/03/20 1513 06/03/20 2006 06/04/20 0433 06/04/20 0434  BP: 131/86 119/86  126/89  Pulse: 98 (!) 108  (!) 110  Resp: 17 18  18   Temp: 98.7 F (37.1 C) 98.5 F (36.9 C)  98.4 F (36.9 C)  TempSrc: Oral Oral  Oral  SpO2: 93% 93%  94%  Weight:   52.5 kg   Height:       Supplemental O2: Room Air SpO2: 94 % O2 Flow Rate (L/min): 6 L/min   Physical Exam:  Physical Exam Constitutional:      General: He is not in acute distress. Eyes:     General:        Right eye: No discharge.        Left eye: No discharge.  Abdominal:     General: Bowel sounds are normal. There is distension.     Tenderness: There is no abdominal tenderness.     Comments: Small amount of red discharge in colostomy back  Skin:    General: Skin is warm.  Neurological:     Mental Status: He is alert.  Psychiatric:        Mood and Affect: Mood normal.     Filed Weights   06/01/20 0507 06/02/20 0500 06/04/20 0433  Weight: 50.7 kg 50.7 kg 52.5 kg     Intake/Output Summary (Last 24 hours) at 06/04/2020 0644 Last data filed at 06/04/2020 0643 Gross per 24 hour  Intake 781.27 ml  Output 1725 ml  Net -943.73 ml   Net IO Since Admission: -3,930.73 mL [06/04/20 0644]  Pertinent Labs: CBC Latest Ref Rng & Units 06/04/2020 06/03/2020 06/02/2020  WBC 4.0 - 10.5 K/uL 15.4(H) 13.9(H) 7.1  Hemoglobin 13.0 - 17.0 g/dL 10.3(L) 10.4(L) 9.4(L)  Hematocrit 39.0 - 52.0 % 33.3(L) 34.1(L) 30.9(L)  Platelets 150 - 400 K/uL 460(H) 404(H) 310    CMP Latest Ref Rng & Units 06/04/2020 06/03/2020 06/02/2020  Glucose 70 - 99 mg/dL 116(H) 121(H) 106(H)  BUN 8 - 23 mg/dL 10 6(L) <5(L)  Creatinine 0.61 - 1.24 mg/dL 0.68  0.63 0.50(L)  Sodium 135 - 145 mmol/L 129(L) 130(L) 130(L)  Potassium 3.5 - 5.1 mmol/L 3.9 4.4 4.0  Chloride 98 - 111 mmol/L 99 100 101  CO2 22 - 32 mmol/L 22 20(L) 24  Calcium 8.9 - 10.3 mg/dL 8.1(L) 8.2(L) 8.1(L)  Total Protein 6.5 - 8.1 g/dL - - -  Total Bilirubin 0.3 - 1.2 mg/dL - - -  Alkaline Phos 38 - 126 U/L - - -  AST 15 - 41 U/L - - -  ALT 0 - 44 U/L - - -    Imaging: No results found.  Assessment/Plan:   Principal Problem:   Primary colorectal adenocarcinoma ([place]) Active Problems:   Symptomatic anemia   Iron deficiency anemia due to chronic blood loss   Duodenal ulcer   Gastritis and gastroduodenitis   Gastroesophageal reflux disease with esophagitis without hemorrhage   Esophageal stricture   Protein-calorie malnutrition, severe   Aortic atherosclerosis (HCC)   Ileus following gastrointestinal surgery [facility])   Patient Summary: Patient is 65 yo gentlemanwith past medical history of questionable COPD,past left5,6,8ribs fracture who presented to  the ED for dizziness, dyspnea with exertion,likely due to symptomatic anemia, found to have duodenal ulcer and acolorectal mass.   Colorectal mass(T3B, N0, Mx) Post-op Ileus Patient continues to have emesis episodes of the night.  His abdomen is still distended.  Bowel sounds presents.  Only a small amount of discharge in the ostomy bag, unchanged from yesterday.  I explained to patient the benefits of putting of NG tube for decompression.  Patient continues to decline NG tube placement.  CT abdomen/pelvis was obtained to evaluate his ileus and also concern for worsening leukocytosis.  - Pending CT abdomen pelvis -Pain controlled with Tylenol, Percocet and morphine as needed.Only got 1 dose of morphine overnight. -NPO for now, can have ice chips and a small amount of fluid - Continue LR 100 cc/h -Follow-up with oncology outpatienttodecide adjuvant chemotherapy based onsurgical pathology results. - Needs to  have good output to his colostomy bag before discharge   Non-bleeding duodenal ulcer Grade B esophagitis without bleeding, gastritis s/p bx, moderate esophageal stenosis s/p dilation Likely in the context of excessive NSAID use, heavy alcohol use and smoking - continue sucrafate - continue protonix 40mg  po BID x10w, then reduce to 40mg  daily - gastric pathologynegative for H. Pylori   Iron deficiencyanemia2/2 GI bleed. Hgb stable at 10.3. Patient received 1 does of Feraheme. - Can give additional Feraheme outpatient. -Daily CBC. Transfusion goal <7   Alcoholuse disorder -Continue thiamine and multivitamin   New lung densitieswhich are questioned to be post inflammatory vs atelectasis.   Diet:NPO VTE: SCDs, advise patient to apply compression device to prevent DVT Code: Full Dispo:PendingColostomy education for family  Gaylan Gerold, DO 06/04/2020, 6:44 AM Pager: [phone]  Please contact the on call pager after 5 pm and on weekends at [phone].

## 2020-06-04 NOTE — Progress Notes (Addendum)
Marc Mcdonald   DOB:1955/04/17   UD#:[id number]   CHY#:[id number]  Hematology and oncology follow-up  Subjective: The patient is sitting up in the recliner.  He continues to have intermittent vomiting.  He had a CT of the abdomen/pelvis performed earlier today.  Results are currently pending.  He has been working with therapy.  Objective:  Vitals:   06/03/20 2006 06/04/20 0434  BP: 119/86 126/89  Pulse: (!) 108 (!) 110  Resp: 18 18  Temp: 98.5 F (36.9 C) 98.4 F (36.9 C)  SpO2: 93% 94%    Body mass index is 16.14 kg/m.  Intake/Output Summary (Last 24 hours) at 06/04/2020 0841 Last data filed at 06/04/2020 0643 Gross per 24 hour  Intake 781.27 ml  Output 1925 ml  Net -1143.73 ml    General: Awake and alert, no distress  HEENT: No thrush or mucositis  CV: Tachycardic  Respiratory: Clear to auscultation bilaterally  Abdomen: Hypoactive bowel sounds, incisions clean dry and intact, stoma pink, moderately distended  No leg edema   CBG (last 3)  No results for input(s): GLUCAP in the last 72 hours.   Labs:  Urine Studies No results for input(s): UHGB, CRYS in the last 72 hours.  Invalid input(s): UACOL, UAPR, USPG, UPH, UTP, UGL, UKET, UBIL, UNIT, UROB, [place], UEPI, UWBC, Marc Mcdonald [place], [place], [place]  Basic Metabolic Panel: Recent Labs  Lab 05/31/20 0241 06/01/20 0232 06/02/20 0048 06/03/20 0051 06/04/20 0059  NA 129* 133* 130* 130* 129*  K 3.9 3.4* 4.0 4.4 3.9  CL 102 103 101 100 99  CO2 21* 26 24 20* 22  GLUCOSE 144* 106* 106* 121* 116*  BUN <5* <5* <5* 6* 10  CREATININE 0.52* 0.57* 0.50* 0.63 0.68  CALCIUM 7.8* 8.0* 8.1* 8.2* 8.1*   GFR Estimated Creatinine Clearance: 68.4 mL/min (by C-G formula based on SCr of 0.68 mg/dL). Liver Function Tests: Recent Labs  Lab 05/29/20 0328 05/30/20 0226 05/31/20 0241 06/01/20 0232  AST 13* 14* 16 15  ALT _0 ALKPHOS 65 71 53 51  BILITOT 1.1 1.6* 0.9 0.9  PROT 5.0* 5.9* 5.3* 5.1*  ALBUMIN 1.9* 2.1* 2.1*  2.0*   No results for input(s): LIPASE, AMYLASE in the last 168 hours. No results for input(s): AMMONIA in the last 168 hours. Coagulation profile No results for input(s): INR, PROTIME in the last 168 hours.  CBC: Recent Labs  Lab 05/31/20 0241 06/01/20 0232 06/02/20 0048 06/03/20 0051 06/04/20 0059  WBC 10.4 6.3 7.1 13.9* 15.4*  NEUTROABS 9.1*  --   --   --   --   HGB 8.8* 8.4* 9.4* 10.4* 10.3*  HCT 28.7* 27.8* 30.9* 34.1* 33.3*  MCV 83.7 85.0 85.4 86.3 87.4  PLT 269 288 310 404* 460*   Cardiac Enzymes: No results for input(s): CKTOTAL, CKMB, CKMBINDEX, TROPONINI in the last 168 hours. BNP: Invalid input(s): POCBNP CBG: No results for input(s): GLUCAP in the last 168 hours. D-Dimer No results for input(s): DDIMER in the last 72 hours. Hgb A1c No results for input(s): HGBA1C in the last 72 hours. Lipid Profile No results for input(s): CHOL, HDL, LDLCALC, TRIG, CHOLHDL, LDLDIRECT in the last 72 hours. Thyroid function studies No results for input(s): TSH, T4TOTAL, T3FREE, THYROIDAB in the last 72 hours.  Invalid input(s): FREET3 Anemia work up No results for input(s): VITAMINB12, FOLATE, FERRITIN, TIBC, IRON, RETICCTPCT in the last 72 hours. Microbiology Recent Results (from the past 240 hour(s))  SARS CORONAVIRUS 2 (TAT 6-24  HRS) Nasopharyngeal Nasopharyngeal Swab     Status: None   Collection Time: 05/25/20  9:01 PM   Specimen: Nasopharyngeal Swab  Result Value Ref Range Status   SARS Coronavirus 2 NEGATIVE NEGATIVE Final    Comment: (NOTE) SARS-CoV-2 target nucleic acids are NOT DETECTED.  The SARS-CoV-2 RNA is generally detectable in upper and lower respiratory specimens during the acute phase of infection. Negative results do not preclude SARS-CoV-2 infection, do not rule out co-infections with other pathogens, and should not be used as the sole basis for treatment or other patient management decisions. Negative results must be combined with clinical  observations, patient history, and epidemiological information. The expected result is Negative.  Fact Sheet for Patients: SugarRoll.be  Fact Sheet for Healthcare Providers: https://www.woods-mathews.com/  This test is not yet approved or cleared by the [place] FDA and  has been authorized for detection and/or diagnosis of SARS-CoV-2 by FDA under an Emergency Use Authorization (EUA). This EUA will remain  in effect (meaning this test can be used) for the duration of the COVID-19 declaration under Se ction 564(b)(1) of the Act, 21 U.S.C. section 360bbb-3(b)(1), unless the authorization is terminated or revoked sooner.  Performed at [facility], [place] [address], [place], [place] [address]   Surgical PCR screen     Status: Abnormal   Collection Time: 05/29/20  3:17 PM   Specimen: Nasal Mucosa; Nasal Swab  Result Value Ref Range Status   MRSA, PCR NEGATIVE NEGATIVE Final   Staphylococcus aureus POSITIVE (A) NEGATIVE Final    Comment: (NOTE) The Xpert SA Assay (FDA approved for NASAL specimens in patients 38 years of age and older), is one component of a comprehensive surveillance program. It is not intended to diagnose infection nor to guide or monitor treatment. Performed at [facility], [place] [address], [place], [place] [address]    Studies:  DG Abd Portable 1V  Result Date: 06/02/2020 CLINICAL DATA:  Abdominal distension post robotic assisted lower anterior colonic resection with end colostomy creation on 05/30/2020. EXAM: PORTABLE ABDOMEN - 1 VIEW COMPARISON:  Preoperative CTA abdomen and pelvis 05/25/2020. FINDINGS: Moderate gaseous distension of multiple loops of small bowel throughout the abdomen. Moderate gaseous distension of the stomach. No suggestion of free air on the supine image. Surgical drain in the pelvis. IMPRESSION: Moderate small bowel and gastric distention which likely indicates postoperative ileus.  Electronically Signed   By: Marc Mcdonald M.D.   On: 06/02/2020 10:38   PATHOLOGY: SURGICAL PATHOLOGY  * THIS IS AN ADDENDUM REPORT *  CASE: [id number]  PATIENT: Marc Mcdonald  Surgical Pathology Report  *Addendum *   Reason for Addendum #1: DNA Mismatch Repair IHC Results   Clinical History: Gastrointestinal hemorrhage (cm)   FINAL MICROSCOPIC DIAGNOSIS:   A. RECTOSIGMOID COLON, LOW ANTERIOR RESECTION:  - Invasive colonic adenocarcinoma, 4.6 cm.  - Tumor invades through the muscularis propria into pericolonic tissues.  - Margins of resection are not involved.  - Twenty-three lymph nodes, negative for carcinoma (0/23).  - See oncology table.   ONCOLOGY TABLE:   COLON AND RECTUM, CARCINOMA: Resection, Including Transanal Disk  Excision of Rectal Neoplasms   Procedure: Low anterior resection  Macroscopic Evaluation of Mesorectum: Focally disrupted but appears  complete  Tumor Site: Rectosigmoid  Histologic Type: Adenocarcinoma  Histologic Grade: G1-2: Well to moderately differentiated  Tumor Size: Greatest dimension: 4.6 cm  Multiple Primary Sites: Not applicable  Tumor Extent: Invades through muscularis propria into pericolonic  tissues  Macroscopic Tumor Perforation: Not identified  Lymphovascular Invasion: Not identified  Perineural Invasion: Not identified  Treatment Effect: No known presurgical therapy  Margins:    Margin Status for Invasive Carcinoma: All margins negative for  invasive carcinoma    Distance from Invasive Carcinoma to Radial (Circumferential)  Margin: 4.5 cm    Margin Status for Non-Invasive Tumor: All margins negative for  high-grade dysplasia/intramucosal       carcinoma and low-grade dysplasia  Regional Lymph Nodes:    Number of Lymph Nodes with Tumor: 0    Number of Lymph Nodes Examined: 23  Tumor Deposits: Not identified  Distant Metastasis:    Distant Site(s) Involved: Not applicable  Pathologic Stage  Classification (pTNM, AJCC 8th Edition): pT3, pN0  Ancillary Studies: MMR/MSI testing will be ordered  Representative Tumor Block: A14  (v4.2.0.1)   ADDENDUM:   Mismatch Repair Protein (IHC)   SUMMARY INTERPRETATION: NORMAL  There is preserved expression of the major MMR proteins. There is a very  low probability that microsatellite instability (MSI) is present.  However, certain clinically significant MMR protein mutations may result  in preservation of nuclear expression. It is recommended that the  preservation of protein expression be correlated with molecular based  MSI testing.   IHC EXPRESSION RESULTS  TEST      RESULT  MLH1:     Preserved nuclear expression  MSH2:     Preserved nuclear expression  MSH6:     Preserved nuclear expression  PMS2:     Preserved nuclear expression    Assessment: 65 y.o. male with past medical history of COPD, heavy smoking and alcohol drinking, presented with symptomatic anemia  1.  Stage IIA (pT3 N0) Rectosigmoid adenocarcinoma 2.  Severe symptomatic anemia, from iron deficiency  3. COPD  4. Hyperbilirubinemia, with predominant indirect bilirubin, no lab evidence of hemolysis, resolved 5.  Duodenal ulcer 6.  Alcohol abuse, history of heavy smoking.   Plan:  -Discussed surgical pathology results with the patient.  We discussed that he does not need chemotherapy.  However, we will plan close observation. -The patient is anemic due to underlying malignancy, GI bleed.  He received a dose of Feraheme in the hospital.  Recommend starting him on ferrous sulfate 325 mg twice daily when his nausea and vomiting have improved.  He should continue this as an outpatient. -all questions were answered  We will arrange for outpatient follow-up in 1 to 2 months.  Scheduling message sent.  Mikey Bussing, NP 06/04/2020    Addendum  I have seen the patient, examined him. I agree with the assessment and and plan and have edited  the notes.   I reviewed the surgical path result with pt in detail. Fortunately this is early stage IIA disease without high risk features, and adjuvant chemo is not recommended. I reviewed cancer surveillance with him. Will see him back in a few month, he will alternate his f/u with Dr. Dema Severin and me. Continue oral iron for 3 months.   Truitt Merle  06/04/2020

## 2020-06-04 NOTE — Progress Notes (Signed)
VAST consulted to obtain IV access for CT. Upon arrival at bedside, noted 20g PIV had been placed. Spoke with Remo Lipps, RN who confirmed no further access was needed at this time.

## 2020-06-04 NOTE — Telephone Encounter (Signed)
Scheduled follow-up appointment per 4/11 schedule message. Patient's daughter is aware.

## 2020-06-04 NOTE — Progress Notes (Signed)
5 Days Post-Op  Subjective: CC: Patient reports that yesterday he was getting up to the chair when he became nauseated. Since that time he has remained nauseated and had ~3 episodes of emesis between yesterday afternoon into this morning. Last episode of emesis was around 3am.  He is actively burping, belching and having hiccups in the room.  He reports his abdomen feels distended.  He is still passing some air and sweat into colostomy bag.  He thinks he may have passed some stool in his bag yesterday.  He last ambulated in the halls on Friday.  Reports he is voiding.  Objective: Vital signs in last 24 hours: Temp:  [98.4 F (36.9 C)-98.7 F (37.1 C)] 98.4 F (36.9 C) (04/11 0434) Pulse Rate:  [98-110] 110 (04/11 0434) Resp:  [17-18] 18 (04/11 0434) BP: (119-131)/(86-89) 126/89 (04/11 0434) SpO2:  [93 %-94 %] 94 % (04/11 0434) Weight:  [52.5 kg] 52.5 kg (04/11 0433) Last BM Date: 06/02/20  Intake/Output from previous day: 04/10 0701 - 04/11 0700 In: 781.3 [I.V.:781.3] Out: 1925 [Urine:250; Emesis/NG output:400; Drains:1275] Intake/Output this shift: No intake/output data recorded.  PE: Gen:  Alert, NAD, pleasant HEENT: EOM's intact, pupils equal and round Card:  Tachycardic  Pulm:  CTAB, no W/R/R, effort normal Abd: Moderately distended but soft, tenderness along the right and lower abdomen without peritonitis, hypoactive bowel sounds, incisions with glue in place and c/d/i. Stoma pink, budded and viable. Colostomy bag with some air and sweat in bag - no stool. Drain RLQ SS.  Ext:  No LE edema  Psych: A&Ox3  Skin: no rashes noted, warm and dry  Lab Results:  Recent Labs    06/03/20 0051 06/04/20 0059  WBC 13.9* 15.4*  HGB 10.4* 10.3*  HCT 34.1* 33.3*  PLT 404* 460*   BMET Recent Labs    06/03/20 0051 06/04/20 0059  NA 130* 129*  K 4.4 3.9  CL 100 99  CO2 20* 22  GLUCOSE 121* 116*  BUN 6* 10  CREATININE 0.63 0.68  CALCIUM 8.2* 8.1*   PT/INR No results  for input(s): LABPROT, INR in the last 72 hours. CMP     Component Value Date/Time   NA 129 (L) 06/04/2020 0059   K 3.9 06/04/2020 0059   CL 99 06/04/2020 0059   CO2 22 06/04/2020 0059   GLUCOSE 116 (H) 06/04/2020 0059   BUN 10 06/04/2020 0059   CREATININE 0.68 06/04/2020 0059   CALCIUM 8.1 (L) 06/04/2020 0059   PROT 5.1 (L) 06/01/2020 0232   ALBUMIN 2.0 (L) 06/01/2020 0232   AST 15 06/01/2020 0232   ALT 11 06/01/2020 0232   ALKPHOS 51 06/01/2020 0232   BILITOT 0.9 06/01/2020 0232   GFRNONAA >60 06/04/2020 0059   GFRAA >60 09/09/2019 0207   Lipase  No results found for: LIPASE     Studies/Results: DG Abd Portable 1V  Result Date: 06/02/2020 CLINICAL DATA:  Abdominal distension post robotic assisted lower anterior colonic resection with end colostomy creation on 05/30/2020. EXAM: PORTABLE ABDOMEN - 1 VIEW COMPARISON:  Preoperative CTA abdomen and pelvis 05/25/2020. FINDINGS: Moderate gaseous distension of multiple loops of small bowel throughout the abdomen. Moderate gaseous distension of the stomach. No suggestion of free air on the supine image. Surgical drain in the pelvis. IMPRESSION: Moderate small bowel and gastric distention which likely indicates postoperative ileus. Electronically Signed   By: Evangeline Dakin M.D.   On: 06/02/2020 10:38    Anti-infectives: Anti-infectives (From admission, onward)  Start     Dose/Rate Route Frequency Ordered Stop   05/30/20 0600  cefoTEtan (CEFOTAN) 2 g in sodium chloride 0.9 % 100 mL IVPB        2 g 200 mL/hr over 30 Minutes Intravenous To Surgery 05/29/20 1430 05/30/20 1745   05/29/20 1400  neomycin (MYCIFRADIN) tablet 1,000 mg       "And" Linked Group Details   1,000 mg Oral 3 times per day 05/29/20 1152 05/29/20 2228   05/29/20 1400  metroNIDAZOLE (FLAGYL) tablet 1,000 mg       "And" Linked Group Details   1,000 mg Oral 3 times per day 05/29/20 1152 05/29/20 2228       Assessment/Plan COPD Non-bleeding duodenal ulcer   Grade B esophogitis, gastritis, esophageal stenosis s/p dilation 4/2 Grade II internal hemorrhoids  Alcohol use disorder History of tobacco use  ABL anemia - hgb stable at 10.3  Rectosigmoid colon cancer S/P robotic assisted LAR with end colostomy 05/30/20 Dr. Dema Severin POD#5  - Path = T3N0 adenocarcinoma. Discussed this with the patient and his son. Previously has already been seen during hospitalization by Dr. Burr Medico.  - Patient clinically with ileus. Continue NPO, IVF. Recommended NGT placement. Patient declined at this time. We discussed risks, including risk of aspiration, if he continues to have emesis - Patient with rising leukocytosis, WBC  7.1 > 13.9 > 15.4. Will obtain CT scan today  - Drain withSSoutput, continue. Rising output in the last 48-72 hours. 270cc > 620cc > 1275cc  - Appreciate WOC assistance with colostomy teaching. Patients son is present today for teaching with [place] - will reach out to [facility] team to let them know.  -Encouraged OOB, ambulation. PT recommending no follow up post op - Pulm toilet, IS  FEN:NPO, IVF VTE: SCDs, okay for chemical prophylaxis from a general surgery standpoint  ID: neomycin/flagyl 4/5, cefotetan 4/6. None currently  Foley: removed POD1 Follow-Up - Dr. Dema Severin and Dr. Burr Medico   LOS: 10 days    Jillyn Ledger , [facility] Surgery 06/04/2020, 8:07 AM Please see Amion for pager number during day hours 7:00am-4:30pm

## 2020-06-04 NOTE — Progress Notes (Signed)
Pt had emesis x 3 overnight. Nausea med given. Notified MD.   Pt refuses to wear SCD's educated.    Some gas is noted in the colostomy bag.

## 2020-06-04 NOTE — Progress Notes (Addendum)
Physical Therapy Treatment Patient Details Name: Marc Mcdonald MRN: [id number] DOB: February 04, 1956 Today's Date: 06/04/2020    History of Present Illness Pt adm 4/1 with dizziness, weakness, and dyspnea. Pt with Hgb of 5.5 and found to have duodenal ulcer and rectal mass. Oncology and surgery involved and staging of rectal CA in process. PMH - ?copd, lt rib fx's 08/2019, etoh    PT Comments    Pt supine in bed on arrival with soiled bed pads under him.  He is motivated to move this session which is an improvement in behvaior.  Focused on progression of gt training without device.  HR elevated this session with mild DOE.  HR with activity 135-156bpm, SPO2 93% on RA.  Limited session due to HR.      Follow Up Recommendations  No PT follow up     Equipment Recommendations  None recommended by PT    Recommendations for Other Services       Precautions / Restrictions Precautions Precautions: Fall Restrictions Weight Bearing Restrictions: No    Mobility  Bed Mobility Overal bed mobility: Needs Assistance Bed Mobility: Rolling;Sidelying to Sit Rolling: Min guard Sidelying to sit: Min assist       General bed mobility comments: Cues for rolling and sidelying to sit to maintain logrolling and reduce pressure and pain in abdomen.    Transfers Overall transfer level: Needs assistance Equipment used: 1 person hand held assist Transfers: Sit to/from Stand Sit to Stand: Min guard         General transfer comment: Pt with mild instability and sway in standing but no physical assistance to rise into standing.  Ambulation/Gait Ambulation/Gait assistance: Min assist Gait Distance (Feet): 325 Feet (x2, Standing rest break when HR elevated greater than 150 bpm.  Mild DOE noted.  SPO2 93%) Assistive device: 1 person hand held assist Gait Pattern/deviations: Step-through pattern;Decreased stride length;Drifts right/left;Wide base of support Gait velocity: decr   General Gait Details:  Slightly unsteady gait without overt loss of balance, holding to PTAs hand for support.  Pt noted to drift and presents with DOE and elevated HR requiring standing rest break.  Improved balance noted without use of device this session.   Stairs             Wheelchair Mobility    Modified Rankin (Stroke Patients Only)       Balance Overall balance assessment: Needs assistance Sitting-balance support: No upper extremity supported;Feet supported Sitting balance-Leahy Scale: Good       Standing balance-Leahy Scale: Poor Standing balance comment: poor balance noted without device.                            Cognition Arousal/Alertness: Awake/alert Behavior During Therapy: Impulsive Overall Cognitive Status: Within Functional Limits for tasks assessed                                 General Comments: Pt lacks awareness and remains impulsive but this is likely his baseline.  Pt has good rapport with PTA.      Exercises      General Comments        Pertinent Vitals/Pain Pain Assessment: Faces Faces Pain Scale: Hurts little more Pain Location: R side Pain Descriptors / Indicators: Sore Pain Intervention(s): Monitored during session;Repositioned    Home Living  Prior Function            PT Goals (current goals can now be found in the care plan section) Acute Rehab PT Goals Patient Stated Goal: go home Potential to Achieve Goals: Good Progress towards PT goals: Progressing toward goals    Frequency    Min 3X/week      PT Plan Current plan remains appropriate    Co-evaluation              AM-PAC PT "6 Clicks" Mobility   Outcome Measure  Help needed turning from your back to your side while in a flat bed without using bedrails?: A Little Help needed moving from lying on your back to sitting on the side of a flat bed without using bedrails?: A Little Help needed moving to and from a bed to a  chair (including a wheelchair)?: A Little Help needed standing up from a chair using your arms (e.g., wheelchair or bedside chair)?: A Little Help needed to walk in hospital room?: A Little Help needed climbing 3-5 steps with a railing? : A Little 6 Click Score: 18    End of Session Equipment Utilized During Treatment: Gait belt Activity Tolerance: Patient tolerated treatment well Patient left: with call bell/phone within reach;in chair;with nursing/sitter in room (did not set alarm as NT into bathe patient post session.) Nurse Communication: Mobility status (elevated HR and SPO2.) PT Visit Diagnosis: Unsteadiness on feet (R26.81);Muscle weakness (generalized) (M62.81);History of falling (Z91.81)     Time: 4403-4742 PT Time Calculation (min) (ACUTE ONLY): 11 min  Charges:  $Gait Training: 8-22 mins                     Bonney Leitz , PTA Acute Rehabilitation Services Pager [phone] Office [phone]     Bertha Earwood Artis Delay 06/04/2020, 3:49 PM

## 2020-06-05 ENCOUNTER — Inpatient Hospital Stay: Payer: Self-pay

## 2020-06-05 ENCOUNTER — Inpatient Hospital Stay (HOSPITAL_COMMUNITY): Payer: Medicare Other

## 2020-06-05 DIAGNOSIS — Z7289 Other problems related to lifestyle: Secondary | ICD-10-CM

## 2020-06-05 DIAGNOSIS — K6389 Other specified diseases of intestine: Secondary | ICD-10-CM

## 2020-06-05 DIAGNOSIS — D509 Iron deficiency anemia, unspecified: Secondary | ICD-10-CM

## 2020-06-05 LAB — BASIC METABOLIC PANEL
Anion gap: 8 (ref 5–15)
BUN: 9 mg/dL (ref 8–23)
CO2: 23 mmol/L (ref 22–32)
Calcium: 8.1 mg/dL — ABNORMAL LOW (ref 8.9–10.3)
Chloride: 100 mmol/L (ref 98–111)
Creatinine, Ser: 0.66 mg/dL (ref 0.61–1.24)
GFR, Estimated: >60 mL/min (ref >60)
Glucose, Bld: 110 mg/dL — ABNORMAL HIGH (ref 70–99)
Potassium: 3.8 mmol/L (ref 3.5–5.1)
Sodium: 131 mmol/L — ABNORMAL LOW (ref 135–145)

## 2020-06-05 LAB — MAGNESIUM: Magnesium: 1.7 mg/dL (ref 1.7–2.4)

## 2020-06-05 LAB — CBC
HCT: 33.1 % — ABNORMAL LOW (ref 39.0–52.0)
Hemoglobin: 9.9 g/dL — ABNORMAL LOW (ref 13.0–17.0)
MCH: 26.3 pg (ref 26.0–34.0)
MCHC: 29.9 g/dL — ABNORMAL LOW (ref 30.0–36.0)
MCV: 88 fL (ref 80.0–100.0)
Platelets: 472 10*3/uL — ABNORMAL HIGH (ref 150–400)
RBC: 3.76 MIL/uL — ABNORMAL LOW (ref 4.22–5.81)
RDW: 24.9 % — ABNORMAL HIGH (ref 11.5–15.5)
WBC: 11.2 10*3/uL — ABNORMAL HIGH (ref 4.0–10.5)
nRBC: 0 % (ref 0.0–0.2)

## 2020-06-05 LAB — PHOSPHORUS: Phosphorus: 4.1 mg/dL (ref 2.5–4.6)

## 2020-06-05 IMAGING — DX DG ABD PORTABLE 1V
1 series · 1 of 1 positions shown · non-contrast
Comparison: Portable exam [F8] hours compared to [DATE]

CLINICAL DATA: Nasogastric tube placement

EXAM:
PORTABLE ABDOMEN - 1 VIEW

[abdomen]
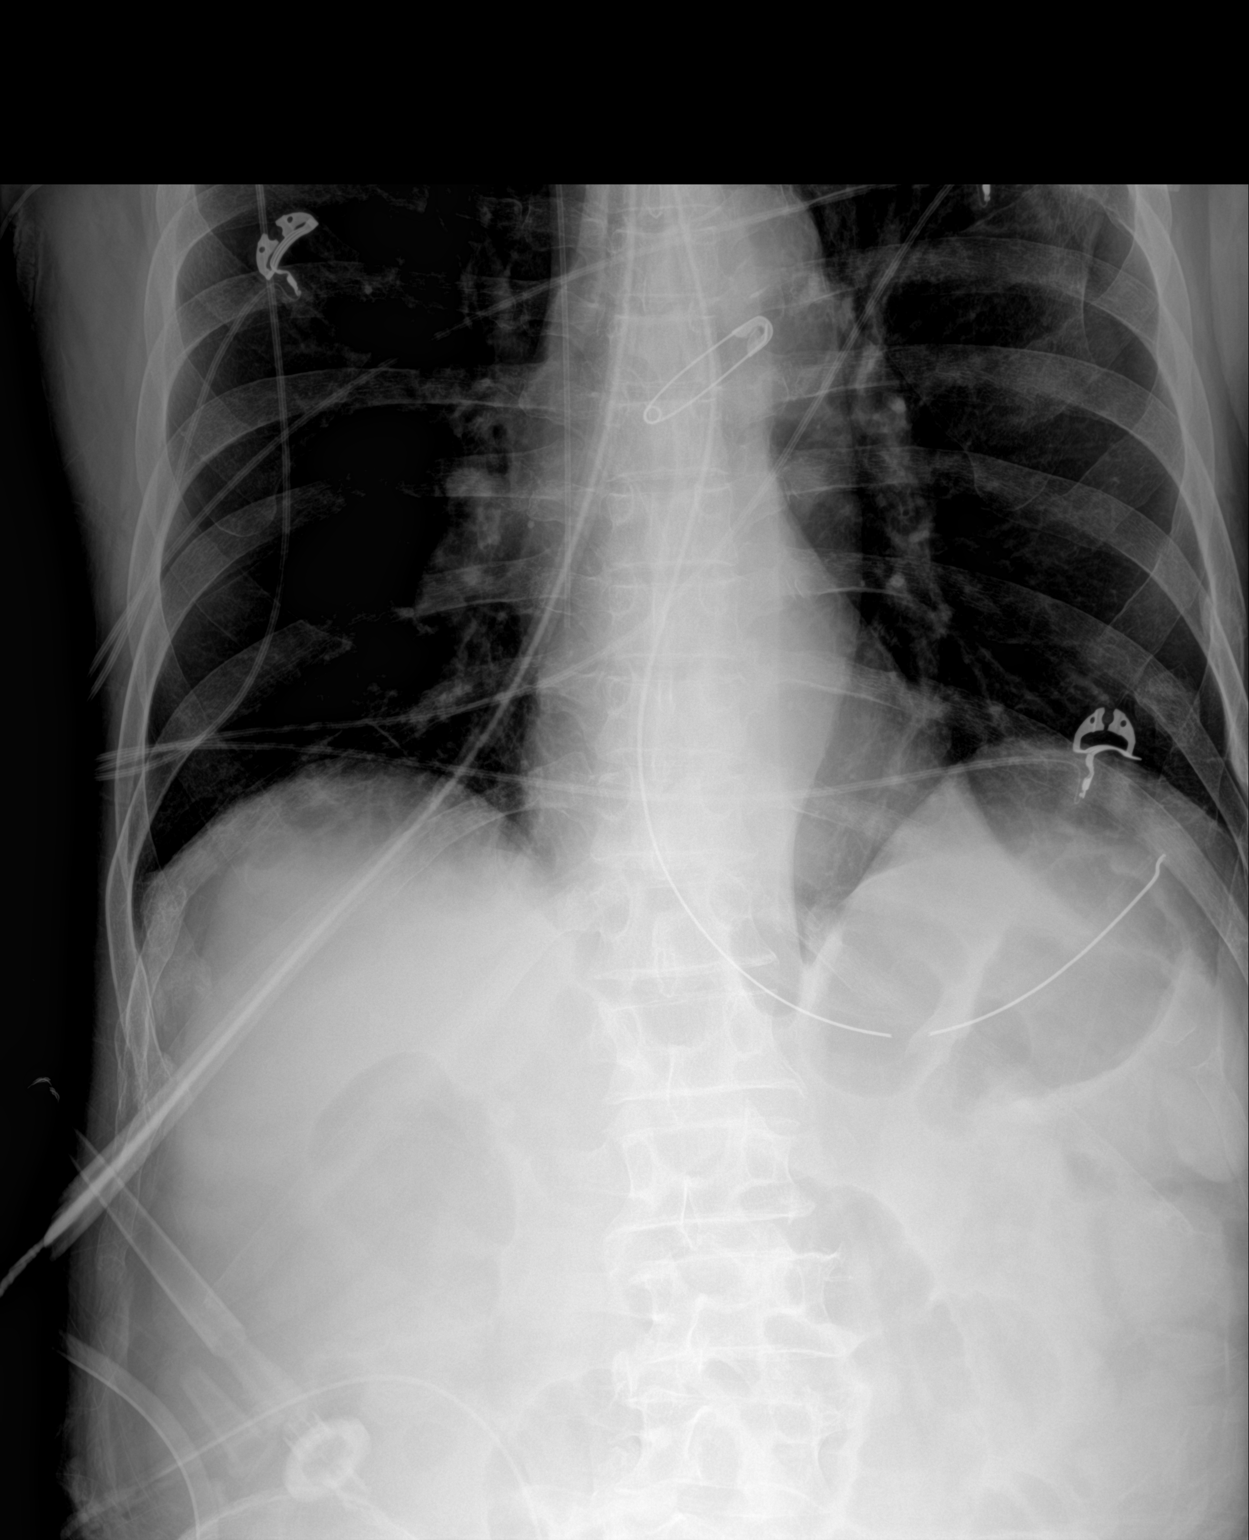

[1 of 1 positions shown; findings below may reference images not displayed]

FINDINGS: Tip of nasogastric tube projects over gastric fundus.

Lung bases clear.

Visualized bowel gas pattern normal.

Osseous structures unremarkable.
IMPRESSION: Tip of nasogastric tube projects over gastric fundus.

## 2020-06-05 MED ORDER — LORAZEPAM 2 MG/ML IJ SOLN: 1.0000 mg | Freq: Once | INTRAMUSCULAR | Status: DC | PRN

## 2020-06-05 MED ORDER — SODIUM CHLORIDE 0.9% FLUSH
10.0000 mL | INTRAVENOUS | Status: DC | PRN
  Administered 2020-06-08 – 2020-06-19 (×5): 10 mL

## 2020-06-05 MED ORDER — LACTATED RINGERS IV SOLN: INTRAVENOUS | Status: AC

## 2020-06-05 MED ORDER — MAGNESIUM SULFATE 2 GM/50ML IV SOLN
2.0000 g | Freq: Once | INTRAVENOUS | Status: AC
  Administered 2020-06-05: 2 g via INTRAVENOUS
  Filled 2020-06-05: qty 50

## 2020-06-05 MED ORDER — TRAVASOL 10 % IV SOLN
INTRAVENOUS | Status: AC
  Filled 2020-06-05: qty 507.6

## 2020-06-05 MED ORDER — INSULIN ASPART 100 UNIT/ML ~~LOC~~ SOLN
0.0000 [IU] | SUBCUTANEOUS | Status: DC
  Administered 2020-06-05 – 2020-06-09 (×20): 1 [IU] via SUBCUTANEOUS
  Administered 2020-06-09: 2 [IU] via SUBCUTANEOUS
  Administered 2020-06-10 (×2): 1 [IU] via SUBCUTANEOUS

## 2020-06-05 MED ORDER — CHLORHEXIDINE GLUCONATE CLOTH 2 % EX PADS
6.0000 | MEDICATED_PAD | Freq: Every day | CUTANEOUS | Status: DC
  Administered 2020-06-05 – 2020-06-10 (×6): 6 via TOPICAL

## 2020-06-05 MED ORDER — ENOXAPARIN SODIUM 40 MG/0.4ML ~~LOC~~ SOLN
40.0000 mg | SUBCUTANEOUS | Status: DC
  Administered 2020-06-05 – 2020-06-20 (×16): 40 mg via SUBCUTANEOUS
  Filled 2020-06-05 (×17): qty 0.4

## 2020-06-05 NOTE — Progress Notes (Signed)
Occupational Therapy Treatment Patient Details Name: Marc Mcdonald MRN: [id number] DOB: [birth year] Today's Date: 06/05/2020    History of present illness Pt adm 4/1 with dizziness, weakness, and dyspnea. Pt with Hgb of 5.5 and found to have duodenal ulcer and rectal mass. Oncology and surgery involved and staging of rectal CA in process. Pt had surgery on 4/7 to place an End Ostomy. PMH - ?copd, lt rib fx's 08/2019, etoh   OT comments  Pt demonstrated increased need for assistance with transfers due to increased pain levels. Pt required min assist for bed mobility and mod assist to stand from the bed. OT provided education to the patient of the importance of moving to assist with healing and clearing up his ileus, and pt was very receptive and thankful. Pt is progressing towards goals. OT updated discharge plan to recommending Home Health to assist with ADL's and Safety when initally discharged home. Acute OT will continue to follow up.    Follow Up Recommendations  Home health OT    Equipment Recommendations  Tub/shower seat    Recommendations for Other Services      Precautions / Restrictions Precautions Precautions: Fall;Other (comment) Precaution Comments: Pt has an NG tube and a JP Drain in place       Mobility Bed Mobility Overal bed mobility: Needs Assistance Bed Mobility: Rolling;Sidelying to Sit;Sit to Supine Rolling: Min assist Sidelying to sit: Min assist   Sit to supine: Min assist   General bed mobility comments: Pt limited by pain, required cueing for hand placement.    Transfers Overall transfer level: Needs assistance Equipment used: Rolling walker (2 wheeled) Transfers: Sit to/from Stand Sit to Stand: Mod assist         General transfer comment: Pt needed physical assistance to power up into standing and verbal cueing to safely sit back down.    Balance Overall balance assessment: Needs assistance Sitting-balance support: No upper extremity  supported;Feet supported Sitting balance-Leahy Scale: Good     Standing balance support: Bilateral upper extremity supported Standing balance-Leahy Scale: Poor Standing balance comment: In standing pt heavily reliant on BUE support w/ RW to maintain balance.                           ADL either performed or assessed with clinical judgement   ADL Overall ADL's : Needs assistance/impaired             Lower Body Bathing: Maximal assistance;Sit to/from stand Lower Body Bathing Details (indicate cue type and reason): Pt required assistance with pericare standing at the bed. Pt requested OT clean him up,                     Functional mobility during ADLs: Moderate assistance;Rolling walker General ADL Comments: Pt limited by pain. He completed bed mobility with min A and Stood from bed in lowest setting with Mod A for pericare, in prep for moving and transferring in later sessions. Pt educated on the positives of movement to assist with healing and clearing up his ileus, in order for him to get better and to go home.     Vision       Perception     Praxis      Cognition Arousal/Alertness: Awake/alert Behavior During Therapy: Agitated Overall Cognitive Status: Within Functional Limits for tasks assessed  General Comments: Pt is in pain and wants to go home, agitated at the situation.        Exercises     Shoulder Instructions       General Comments Pt has a JP drain on L side, new Ostomy on R side, and PICC line placed on LUE    Pertinent Vitals/ Pain       Pain Assessment: Faces Faces Pain Scale: Hurts even more Pain Location: Abdomen Pain Descriptors / Indicators: Grimacing;Guarding Pain Intervention(s): Monitored during session;Repositioned  Home Living                                          Prior Functioning/Environment              Frequency  Min 2X/week         Progress Toward Goals  OT Goals(current goals can now be found in the care plan section)  Progress towards OT goals: Progressing toward goals  Acute Rehab OT Goals Patient Stated Goal: go home OT Goal Formulation: With patient Time For Goal Achievement: 06/11/20 Potential to Achieve Goals: Good ADL Goals Pt Will Perform Grooming: Independently;standing Additional ADL Goal #1: Pt will perform UE and LE dressing sitting, with mod I, using AE as needed. Additional ADL Goal #2: Pt will report and demonstrate 3 fall prevention techniques.  Plan Frequency remains appropriate;Discharge plan needs to be updated    Co-evaluation                 AM-PAC OT "6 Clicks" Daily Activity     Outcome Measure   Help from another person eating meals?: None Help from another person taking care of personal grooming?: None Help from another person toileting, which includes using toliet, bedpan, or urinal?: A Lot Help from another person bathing (including washing, rinsing, drying)?: A Lot Help from another person to put on and taking off regular upper body clothing?: None Help from another person to put on and taking off regular lower body clothing?: A Little 6 Click Score: 19    End of Session Equipment Utilized During Treatment: Gait belt;Rolling walker  OT Visit Diagnosis: Unsteadiness on feet (R26.81);Other abnormalities of gait and mobility (R26.89);Muscle weakness (generalized) (M62.81)   Activity Tolerance Patient tolerated treatment well   Patient Left in bed;with call bell/phone within reach;with bed alarm set   Nurse Communication Mobility status;Other (comment) (Pt was given ice chips.)        Time: 7829-5621 OT Time Calculation (min): 26 min  Charges: OT General Charges $OT Visit: 1 Visit OT Treatments $Self Care/Home Management : 23-37 mins  Laketta Soderberg H., OTR/L Acute Rehabilitation  Joslynne Klatt Elane Bing Plume 06/05/2020, 3:49 PM

## 2020-06-05 NOTE — Progress Notes (Addendum)
6 Days Post-Op  Subjective: CC: Patient reports continued abdominal distention, occasional nausea, hiccups and burping.  Nurse reports emesis overnight and he has 400 cc documented on I/O's.  Patient reports some soreness around his incisions but otherwise no abdominal pain.  No output from colostomy since yesterday morning.  No air in his colostomy bag.  [facility] nurse did colostomy teaching with patient's son yesterday.  Patient walked with therapies yesterday. Voiding.   Objective: Vital signs in last 24 hours: Temp:  [98.3 F (36.8 C)-98.6 F (37 C)] 98.3 F (36.8 C) (04/12 0511) Pulse Rate:  [105-110] 105 (04/12 0511) Resp:  [18] 18 (04/12 0511) BP: (129-135)/(84-90) 131/90 (04/12 0511) SpO2:  [93 %-97 %] 97 % (04/12 0511) Weight:  [60.5 kg] 60.5 kg (04/12 0500) Last BM Date: 06/02/20  Intake/Output from previous day: 04/11 0701 - 04/12 0700 In: 451.3 [P.O.:300; I.V.:1.3; IV Piggyback:150] Out: 905 [Urine:300; Emesis/NG output:400; Drains:205] Intake/Output this shift: Total I/O In: -  Out: 10 [Drains:10]  PE: Gen:  Alert, NAD, pleasant HEENT: EOM's intact, pupils equal and round Card:  Tachycardic  Pulm:  CTAB, no W/R/R, effort normal Abd: Moderately distended but soft, tenderness near incisions that appear appropriate in post operative setting. No peritonitis. Hypoactive bowel sounds. Incisions with glue in place and c/d/i. Stoma pink, budded and viable. Colostomy bag with sweat in bag - no stool or air. Drain RLQ SS (205cc/24 hours) Ext:  No LE edema  Psych: A&Ox3  Skin: no rashes noted, warm and dry  Lab Results:  Recent Labs    06/04/20 0059 06/05/20 0254  WBC 15.4* 11.2*  HGB 10.3* 9.9*  HCT 33.3* 33.1*  PLT 460* 472*   BMET Recent Labs    06/04/20 0059 06/05/20 0254  NA 129* 131*  K 3.9 3.8  CL 99 100  CO2 22 23  GLUCOSE 116* 110*  BUN 10 9  CREATININE 0.68 0.66  CALCIUM 8.1* 8.1*   PT/INR No results for input(s): LABPROT, INR in the last 72  hours. CMP     Component Value Date/Time   NA 131 (L) 06/05/2020 0254   K 3.8 06/05/2020 0254   CL 100 06/05/2020 0254   CO2 23 06/05/2020 0254   GLUCOSE 110 (H) 06/05/2020 0254   BUN 9 06/05/2020 0254   CREATININE 0.66 06/05/2020 0254   CALCIUM 8.1 (L) 06/05/2020 0254   PROT 5.1 (L) 06/01/2020 0232   ALBUMIN 2.0 (L) 06/01/2020 0232   AST 15 06/01/2020 0232   ALT 11 06/01/2020 0232   ALKPHOS 51 06/01/2020 0232   BILITOT 0.9 06/01/2020 0232   GFRNONAA >60 06/05/2020 0254   GFRAA >60 09/09/2019 0207   Lipase  No results found for: LIPASE     Studies/Results: CT ABDOMEN PELVIS W CONTRAST  Result Date: 06/04/2020 CLINICAL DATA:  Abdominal pain, fever, new diagnosis rectal cancer, postoperative rectosigmoid colon resection and left lower quadrant ostomy. EXAM: CT ABDOMEN AND PELVIS WITH CONTRAST TECHNIQUE: Multidetector CT imaging of the abdomen and pelvis was performed using the standard protocol following bolus administration of intravenous contrast. CONTRAST:  176mL OMNIPAQUE IOHEXOL 300 MG/ML  SOLN COMPARISON:  05/26/2019 FINDINGS: Lower chest: New atelectasis or consolidation of the right lung base (series 5, image 8). New trace left pleural effusion. Hepatobiliary: No solid liver abnormality is seen. Minimal sludge in the dependent gallbladder (series 3, image 26). Gallbladder wall thickening, or biliary dilatation. Pancreas: Unremarkable. No pancreatic ductal dilatation or surrounding inflammatory changes. Spleen: Normal in size without significant  abnormality. Adrenals/Urinary Tract: Adrenal glands are unremarkable. Kidneys are normal, without renal calculi, solid lesion, or hydronephrosis. Bladder is unremarkable. Stomach/Bowel: Stomach is within normal limits. Appendix appears normal. Status post interval rectosigmoid colon resection with left lower quadrant end ostomy and surgical drain in the low pelvis. The stomach and small bowel are mildly distended and fluid-filled  throughout, largest loops measuring up to 3.8 cm. Vascular/Lymphatic: Aortic atherosclerosis. No enlarged abdominal or pelvic lymph nodes. Reproductive: No mass or other significant abnormality. Other: No abdominal wall hernia or abnormality. No abdominopelvic ascites. Musculoskeletal: No acute or significant osseous findings. IMPRESSION: 1. Status post interval rectosigmoid colon resection with left lower quadrant end ostomy and surgical drain in the low pelvis. 2. The stomach and small bowel are mildly distended and fluid-filled throughout, largest loops measuring up to 3.8 cm. Findings are consistent with postoperative ileus. 3. No evidence of postoperative fluid collection or other complication. 4. New atelectasis or consolidation of the right lung base. New trace left pleural effusion. Aortic Atherosclerosis (ICD10-I70.0). Electronically Signed   By: Eddie Candle M.D.   On: 06/04/2020 16:36    Anti-infectives: Anti-infectives (From admission, onward)   Start     Dose/Rate Route Frequency Ordered Stop   05/30/20 0600  cefoTEtan (CEFOTAN) 2 g in sodium chloride 0.9 % 100 mL IVPB        2 g 200 mL/hr over 30 Minutes Intravenous To Surgery 05/29/20 1430 05/30/20 1745   05/29/20 1400  neomycin (MYCIFRADIN) tablet 1,000 mg       "And" Linked Group Details   1,000 mg Oral 3 times per day 05/29/20 1152 05/29/20 2228   05/29/20 1400  metroNIDAZOLE (FLAGYL) tablet 1,000 mg       "And" Linked Group Details   1,000 mg Oral 3 times per day 05/29/20 1152 05/29/20 2228       Assessment/Plan COPD Non-bleeding duodenal ulcer  Grade B esophogitis, gastritis, esophageal stenosis s/p dilation 4/2 Grade II internal hemorrhoids  Alcohol use disorder History of tobacco use  ABL anemia - hgb stable at 9.9  Rectosigmoid colon cancer S/P robotic assisted LAR with end colostomy 05/30/20 Dr. Dema Severin POD#6 - Path=T3N0 adenocarcinoma. Seen by Dr. Burr Medico and has follow up scheduled for 4/11 - CT scan 4/11 w/  ileus. No postoperative fluid collection or other complication noted.  - Patient is agreeable to NGT placement.  - Start TPN - Drain withSSoutput, continue.  - Appreciate WOC assistance with colostomy teaching.  -Encouraged OOB, ambulation. PT recommending no follow up post op - Pulm toilet, IS  QHU:TMLYY NGT, NPO, IVF per primary team, start TPN VTE: SCDs, okay for chemical prophylaxis from a general surgery standpoint  ID: neomycin/flagyl 4/5, cefotetan 4/6. None currently. W 11.2 Foley: removed POD1. Voiding.  Follow-Up - Dr. Dema Severin and Dr. Burr Medico   LOS: 11 days    Jillyn Ledger , [facility] Surgery 06/05/2020, 8:15 AM Please see Amion for pager number during day hours 7:00am-4:30pm

## 2020-06-05 NOTE — Progress Notes (Signed)
HD#11 Subjective:   No acute events overnight.   During evaluation this morning, patient expresses polite frustration with placement of the NG tube.  States that he took 3 tries to place a tube successfully.  He reports yesterday he had nausea leading to spitting but not emesis. Reports he hasn't been able to sleep since 10 pm last evening.   Objective:  Vital signs in last 24 hours: Vitals:   06/04/20 1451 06/04/20 2123 06/05/20 0500 06/05/20 0511  BP: 135/85 129/84  131/90  Pulse: (!) 109 (!) 110  (!) 105  Resp:  18  18  Temp: 98.3 F (36.8 C) 98.6 F (37 C)  98.3 F (36.8 C)  TempSrc: Oral Oral  Oral  SpO2: 93% 95%  97%  Weight:   60.5 kg   Height:       Supplemental O2: Room Air SpO2: 97 % O2 Flow Rate (L/min): 6 L/min   Physical Exam:  Physical Exam Constitutional:      General: He is not in acute distress. HENT:     Nose:     Comments: NG tube in place, bilious content collected Abdominal:     General: There is distension (Improved distention).     Tenderness: There is no abdominal tenderness.     Comments: Small amount of discharge collected in the ostomy bag  Skin:    General: Skin is warm.  Neurological:     Mental Status: He is alert.  Psychiatric:        Mood and Affect: Mood normal.     Filed Weights   06/02/20 0500 06/04/20 0433 06/05/20 0500  Weight: 50.7 kg 52.5 kg 60.5 kg     Intake/Output Summary (Last 24 hours) at 06/05/2020 0638 Last data filed at 06/05/2020 8453 Gross per 24 hour  Intake 1231.27 ml  Output 1105 ml  Net 126.27 ml   Net IO Since Admission: -4,585.73 mL [06/05/20 0638]  Pertinent Labs: CBC Latest Ref Rng & Units 06/05/2020 06/04/2020 06/03/2020  WBC 4.0 - 10.5 K/uL 11.2(H) 15.4(H) 13.9(H)  Hemoglobin 13.0 - 17.0 g/dL 9.9(L) 10.3(L) 10.4(L)  Hematocrit 39.0 - 52.0 % 33.1(L) 33.3(L) 34.1(L)  Platelets 150 - 400 K/uL 472(H) 460(H) 404(H)    CMP Latest Ref Rng & Units 06/05/2020 06/04/2020 06/03/2020  Glucose 70 - 99  mg/dL 110(H) 116(H) 121(H)  BUN 8 - 23 mg/dL 9 10 6(L)  Creatinine 0.61 - 1.24 mg/dL 0.66 0.68 0.63  Sodium 135 - 145 mmol/L 131(L) 129(L) 130(L)  Potassium 3.5 - 5.1 mmol/L 3.8 3.9 4.4  Chloride 98 - 111 mmol/L 100 99 100  CO2 22 - 32 mmol/L 23 22 20(L)  Calcium 8.9 - 10.3 mg/dL 8.1(L) 8.1(L) 8.2(L)  Total Protein 6.5 - 8.1 g/dL - - -  Total Bilirubin 0.3 - 1.2 mg/dL - - -  Alkaline Phos 38 - 126 U/L - - -  AST 15 - 41 U/L - - -  ALT 0 - 44 U/L - - -    Imaging: CT ABDOMEN PELVIS W CONTRAST  Result Date: 06/04/2020 CLINICAL DATA:  Abdominal pain, fever, new diagnosis rectal cancer, postoperative rectosigmoid colon resection and left lower quadrant ostomy. EXAM: CT ABDOMEN AND PELVIS WITH CONTRAST TECHNIQUE: Multidetector CT imaging of the abdomen and pelvis was performed using the standard protocol following bolus administration of intravenous contrast. CONTRAST:  158mL OMNIPAQUE IOHEXOL 300 MG/ML  SOLN COMPARISON:  05/26/2019 FINDINGS: Lower chest: New atelectasis or consolidation of the right lung base (series 5,  image 8). New trace left pleural effusion. Hepatobiliary: No solid liver abnormality is seen. Minimal sludge in the dependent gallbladder (series 3, image 26). Gallbladder wall thickening, or biliary dilatation. Pancreas: Unremarkable. No pancreatic ductal dilatation or surrounding inflammatory changes. Spleen: Normal in size without significant abnormality. Adrenals/Urinary Tract: Adrenal glands are unremarkable. Kidneys are normal, without renal calculi, solid lesion, or hydronephrosis. Bladder is unremarkable. Stomach/Bowel: Stomach is within normal limits. Appendix appears normal. Status post interval rectosigmoid colon resection with left lower quadrant end ostomy and surgical drain in the low pelvis. The stomach and small bowel are mildly distended and fluid-filled throughout, largest loops measuring up to 3.8 cm. Vascular/Lymphatic: Aortic atherosclerosis. No enlarged abdominal  or pelvic lymph nodes. Reproductive: No mass or other significant abnormality. Other: No abdominal wall hernia or abnormality. No abdominopelvic ascites. Musculoskeletal: No acute or significant osseous findings. IMPRESSION: 1. Status post interval rectosigmoid colon resection with left lower quadrant end ostomy and surgical drain in the low pelvis. 2. The stomach and small bowel are mildly distended and fluid-filled throughout, largest loops measuring up to 3.8 cm. Findings are consistent with postoperative ileus. 3. No evidence of postoperative fluid collection or other complication. 4. New atelectasis or consolidation of the right lung base. New trace left pleural effusion. Aortic Atherosclerosis (ICD10-I70.0). Electronically Signed   By: Eddie Candle M.D.   On: 06/04/2020 16:36    Assessment/Plan:   Principal Problem:   sigmoid colon cancer Active Problems:   Symptomatic anemia   Iron deficiency anemia due to chronic blood loss   Duodenal ulcer   Gastritis and gastroduodenitis   Gastroesophageal reflux disease with esophagitis without hemorrhage   Esophageal stricture   Protein-calorie malnutrition, severe   Aortic atherosclerosis (HCC)   Ileus following gastrointestinal surgery [facility])   Patient Summary: Patient is 65 yo gentlemanwith past medical history of questionable COPD,past left5,6,8ribs fracture who presented to the ED for dizziness, dyspnea with exertion,likely due to symptomatic anemia, found to have duodenal ulcer and acolorectal mass.   Colorectal mass(T3B, N0, Mx) Post-op Ileus CT abdomen pelvis showed persistent postop ileus and no fluid collection concerning for infection or abscess.  Patient had one episode of emesis overnight.  NG tube was placed this morning and collected 1500 cc output.  Surgery also recommended starting TPN due to his malnutrition and longstanding n.p.o. -Pain controlled with Tylenol, Percocet and morphine as needed.Got 1 dose of morphine  overnight. -NPO - TPN started.  Monitor for refeeding syndrome - Continue LR 100 cc/h. Reduce to 50 cc/h at 1800  -No adjuvant chemotherapy per oncology   Non-bleeding duodenal ulcer Grade B esophagitis without bleeding, gastritis s/p bx, moderate esophageal stenosis s/p dilation Likely in the context of excessive NSAID use, heavy alcohol use and smoking - continue sucrafate - continue protonix 40mg  IV BID (transition to p.o. when tolerate, 40mg  BID x10w, then reduce to 40mg  daily) - gastric pathologynegative for H. Pylori   Iron deficiencyanemia2/2 GI bleed. Hgb stable at 10.3. Patient received 1 does of Feraheme. Oncology recommended starting oral ferrous sulfate.  Will start when he can tolerate p.o..    Alcoholuse disorder -Continue thiamine and multivitamin   New lung densitieswhich are questioned to be post inflammatory vs atelectasis.   Diet:NPO, TPN VTE: Lovenox Code: Full Dispo:Pendingimprovement from postop ileus  Gaylan Gerold, DO 06/05/2020, 6:38 AM Pager: [phone]  Please contact the on call pager after 5 pm and on weekends at [phone].

## 2020-06-05 NOTE — Progress Notes (Signed)
PHARMACY - TOTAL PARENTERAL NUTRITION CONSULT NOTE   Indication: Prolonged ileus  Patient Measurements: Height: 5\' 11"  (180.3 cm) Weight: 60.5 kg (133 lb 6.1 oz) IBW/kg (Calculated) : 75.3 TPN AdjBW (KG): 56.8 Body mass index is 18.6 kg/m. Usual Weight: ~59kg per chart, ~62kg per patient   Assessment:  65 year old man with rectosigmoid colo cancer s/p LAR with end colostomy 05/30/20. Patient tolerate 0-25% of CLD post op. Patient with a lot of NV. On 4/12 patient reported continued abdominal distention, occasional nausea, hiccups and burping. Nurse reports 400 ml emesis overnight and no colostomy output. Surgery team ordered NGT and PICC on 4/12. Weights have been variable 47.7-61.6kg  likely due to different scales used in hospital. Last standing weight was 54.9kg on 4/6 indicating about 5kg weight loss over 8 months. Very high risk for refeeding.  Glucose / Insulin: BG < 120, A1C 5, not on insulin  Electrolytes: Na 131, K 3.8 (Goal >/=4), Mg 1.7 (Goal >/=2) Renal: Scr 0.66, BUN wnl Hepatic: LFTs wnl, albumin 2.0, prealbumin 8 Intake / Output; MIVF: LR at 100 ml/hr; UOP amount not fully charted. Emesis 400 ml. Drain 236ml. Colostomy 13ml.  GI Imaging: 4/9 Abd XR: Moderate small bowel and gastric distention likely indicates postoperative ileus. 4/11 CT abd: fluid filled SB consistent w/ postoperative ileus 4/12 Abd XR: pend  GI Surgeries / Procedures:  4/6 Robotic assisted LAR with end colostomy  Central access: 4/12 Pending  TPN start date: 4/12 pending PICC   Nutritional Goals (per RD recommendation on 4/11): kCal: 2000-2200, Protein: 100-115g, Fluid: >/=2 L Goal TPN rate is 90 mL/hr (provides 101 g of protein and 2150 kcals per day)  Current Nutrition:  NPO  Plan:  Start TPN at 54mL/hr at 1800 (provides 51g AA, 25g lipids, and 1075 kcals, estimated ~50% of needs)  Electrolytes in TPN: Na 172mEq/L, K 52mEq/L, Ca 32mEq/L, Mg 14 mEq/L, and Phos 10 mmol/L. Cl:Ac 1:1 Add standard  MVI and trace elements to TPN Initiate Moderate q4h SSI and adjust as needed  Reduce MIVF to 50 mL/hr at 1800 Give Mg 2g IV x1  Monitor TPN labs daily until stable at goal TPN then on Mon/Thurs  Benetta Spar, PharmD, BCPS, [place] Pharmacist  Please check AMION for all [place] phone numbers After 10:00 PM, call [place]

## 2020-06-05 NOTE — Progress Notes (Signed)
Nutrition Follow-up  DOCUMENTATION CODES:   Severe malnutrition in context of chronic illness,Underweight  INTERVENTION:    Monitor magnesium, potassium, and phosphorus daily for at least 5 days, as pt is at high risk for refeeding syndrome given prolonged NPO/CL status, severe malnutrition diagnosis, underweight. Recommend aggressive repletion of electrolytes as needed. Discussed with Pharmacy  TPN order per Pharmacy to meet 100% estimated nutritional needs; cautious progression to goal given high risk of refeeding    NUTRITION DIAGNOSIS:   Inadequate oral intake related to poor appetite,social / environmental circumstances,altered GI function as evidenced by NPO status.  Continues but being addressed via TPN  GOAL:   Patient will meet greater than or equal to 90% of their needs  Progressing  MONITOR:   Diet advancement,PO intake,Labs,Weight trends  REASON FOR ASSESSMENT:   Consult Assessment of nutrition requirement/status  ASSESSMENT:   65 yo male admitted with symptomatic anemia, dyspnea on exertion, hyponatremia. PMH includes COPD, EtOH abuse (12-15 beers per day) and tobacco abuse (1 pack every other day)  4/06 Robotic assisted LAR with end colostomy 4/10 +emesis 4/11 CT abdomen: ileus 4/12 PICC line placed, NG placed for decompression  Surgical pathology: T3N0 adenocarcinoma; medical oncology following  CT abdomen consistent with ileus. NG placed with 1500 mL out so far today Received Consult for initiation of TPN today  Electrolytes wdl prior to initiation of TPN; high refeeding risk, electrolytes to be monitored closely  Labs: potassium wdl, phosphorus wdl, magnesium wdl Meds: reviewed  Diet Order:   Diet Order            Diet NPO time specified Except for: Ice Chips, Sips with Meds  Diet effective now                 EDUCATION NEEDS:   Not appropriate for education at this time  Skin:  Skin Assessment: Reviewed RN Assessment  Last BM:   4/3 per pt - prior to procedures  Height:   Ht Readings from Last 1 Encounters:  05/26/20 5\' 11"  (1.803 m)    Weight:   Wt Readings from Last 1 Encounters:  06/05/20 60.5 kg    Ideal Body Weight:  78.2 kg  BMI:  Body mass index is 18.6 kg/m.  Estimated Nutritional Needs:   Kcal:  2000-2200 kcals  Protein:  100-115 g  Fluid:  >/= 2 L   Kerman Passey MS, RDN, LDN, CNSC Registered Dietitian III Clinical Nutrition RD Pager and On-Call Pager Number Located in [place]

## 2020-06-05 NOTE — Progress Notes (Signed)
Peripherally Inserted Central Catheter Placement  The IV Nurse has discussed with the patient and/or persons authorized to consent for the patient, the purpose of this procedure and the potential benefits and risks involved with this procedure.  The benefits include less needle sticks, lab draws from the catheter, and the patient may be discharged home with the catheter. Risks include, but not limited to, infection, bleeding, blood clot (thrombus formation), and puncture of an artery; nerve damage and irregular heartbeat and possibility to perform a PICC exchange if needed/ordered by physician.  Alternatives to this procedure were also discussed.  Bard Power PICC patient education guide, fact sheet on infection prevention and patient information card has been provided to patient /or left at bedside.    PICC Placement Documentation  PICC Double Lumen 01/58/68 PICC Right Basilic 41 cm 0 cm (Active)  Indication for Insertion or Continuance of Line Administration of hyperosmolar/irritating solutions (i.e. TPN, Vancomycin, etc.) 06/05/20 1241  Exposed Catheter (cm) 0 cm 06/05/20 1241  Site Assessment Clean;Dry;Intact 06/05/20 1241  Lumen #1 Status Flushed;Blood return noted 06/05/20 1241  Lumen #2 Status Flushed;Blood return noted 06/05/20 1241  Dressing Type Transparent 06/05/20 1241  Dressing Status Clean;Intact;Dry 06/05/20 1241  Antimicrobial disc in place? Yes 06/05/20 1241  Dressing Intervention New dressing 06/05/20 2574  Dressing Change Due 06/12/20 06/05/20 1241       Christella Noa Albarece 06/05/2020, 12:43 PM

## 2020-06-06 DIAGNOSIS — E871 Hypo-osmolality and hyponatremia: Secondary | ICD-10-CM | POA: Diagnosis present

## 2020-06-06 LAB — GLUCOSE, CAPILLARY
Glucose-Capillary: 129 mg/dL — ABNORMAL HIGH (ref 70–99)
Glucose-Capillary: 134 mg/dL — ABNORMAL HIGH (ref 70–99)
Glucose-Capillary: 134 mg/dL — ABNORMAL HIGH (ref 70–99)
Glucose-Capillary: 140 mg/dL — ABNORMAL HIGH (ref 70–99)
Glucose-Capillary: 140 mg/dL — ABNORMAL HIGH (ref 70–99)
Glucose-Capillary: 146 mg/dL — ABNORMAL HIGH (ref 70–99)
Glucose-Capillary: 148 mg/dL — ABNORMAL HIGH (ref 70–99)

## 2020-06-06 LAB — CBC
HCT: 32.3 % — ABNORMAL LOW (ref 39.0–52.0)
Hemoglobin: 10 g/dL — ABNORMAL LOW (ref 13.0–17.0)
MCH: 26.8 pg (ref 26.0–34.0)
MCHC: 31 g/dL (ref 30.0–36.0)
MCV: 86.6 fL (ref 80.0–100.0)
Platelets: 462 10*3/uL — ABNORMAL HIGH (ref 150–400)
RBC: 3.73 MIL/uL — ABNORMAL LOW (ref 4.22–5.81)
RDW: 24.2 % — ABNORMAL HIGH (ref 11.5–15.5)
WBC: 9.4 10*3/uL (ref 4.0–10.5)
nRBC: 0 % (ref 0.0–0.2)

## 2020-06-06 LAB — COMPREHENSIVE METABOLIC PANEL
ALT: 11 U/L (ref 0–44)
AST: 13 U/L — ABNORMAL LOW (ref 15–41)
Albumin: 1.9 g/dL — ABNORMAL LOW (ref 3.5–5.0)
Alkaline Phosphatase: 53 U/L (ref 38–126)
Anion gap: 6 (ref 5–15)
BUN: 10 mg/dL (ref 8–23)
CO2: 24 mmol/L (ref 22–32)
Calcium: 7.9 mg/dL — ABNORMAL LOW (ref 8.9–10.3)
Chloride: 104 mmol/L (ref 98–111)
Creatinine, Ser: 0.54 mg/dL — ABNORMAL LOW (ref 0.61–1.24)
GFR, Estimated: >60 mL/min (ref >60)
Glucose, Bld: 141 mg/dL — ABNORMAL HIGH (ref 70–99)
Potassium: 3.5 mmol/L (ref 3.5–5.1)
Sodium: 134 mmol/L — ABNORMAL LOW (ref 135–145)
Total Bilirubin: 1 mg/dL (ref 0.3–1.2)
Total Protein: 5 g/dL — ABNORMAL LOW (ref 6.5–8.1)

## 2020-06-06 LAB — DIFFERENTIAL
Abs Immature Granulocytes: 0.05 10*3/uL (ref 0.00–0.07)
Basophils Absolute: 0 10*3/uL (ref 0.0–0.1)
Basophils Relative: 0 %
Eosinophils Absolute: 0.1 10*3/uL (ref 0.0–0.5)
Eosinophils Relative: 1 %
Immature Granulocytes: 1 %
Lymphocytes Relative: 10 %
Lymphs Abs: 0.9 10*3/uL (ref 0.7–4.0)
Monocytes Absolute: 1.2 10*3/uL — ABNORMAL HIGH (ref 0.1–1.0)
Monocytes Relative: 12 %
Neutro Abs: 7.2 10*3/uL (ref 1.7–7.7)
Neutrophils Relative %: 76 %

## 2020-06-06 LAB — TRIGLYCERIDES: Triglycerides: 58 mg/dL (ref <150)

## 2020-06-06 LAB — MAGNESIUM: Magnesium: 2 mg/dL (ref 1.7–2.4)

## 2020-06-06 LAB — PHOSPHORUS: Phosphorus: 3.2 mg/dL (ref 2.5–4.6)

## 2020-06-06 MED ORDER — PHENOL 1.4 % MT LIQD
1.0000 | OROMUCOSAL | Status: DC | PRN
  Filled 2020-06-06: qty 177

## 2020-06-06 MED ORDER — POTASSIUM CHLORIDE 10 MEQ/100ML IV SOLN
10.0000 meq | INTRAVENOUS | Status: AC
  Administered 2020-06-06 (×3): 10 meq via INTRAVENOUS
  Filled 2020-06-06 (×4): qty 100

## 2020-06-06 MED ORDER — TRAVASOL 10 % IV SOLN
INTRAVENOUS | Status: AC
  Filled 2020-06-06 (×2): qty 789.6

## 2020-06-06 MED ORDER — MENTHOL 3 MG MT LOZG: 1.0000 | LOZENGE | OROMUCOSAL | Status: DC | PRN

## 2020-06-06 MED ORDER — LACTATED RINGERS IV SOLN: INTRAVENOUS | Status: AC

## 2020-06-06 MED ORDER — POTASSIUM CHLORIDE 10 MEQ/100ML IV SOLN
10.0000 meq | Freq: Once | INTRAVENOUS | Status: AC
  Administered 2020-06-06: 10 meq via INTRAVENOUS

## 2020-06-06 NOTE — Consult Note (Signed)
[place] Nurse ostomy follow up Stoma type/location: LLQ colostomy  Son had to go in to work early and is not here.  Patient says to come early in the morning and son will be here. Son would like to do a hands on pouch change.  The 3 ordered pouch sets and barrier ring are in the room along with the pattern.  Scant brown stool in pouch.  Patient eating an ice pop, NG tube in place.   Education provided: Informed patient we would be back in the morning to attempt a pouch change session with son.  Enrolled patient in [place] Start Discharge program: Yes Will follow.  Domenic Moras MSN, RN, FNP-BC CWON Wound, Ostomy, Continence Nurse Pager [phone]

## 2020-06-06 NOTE — Progress Notes (Signed)
HD#12 Subjective:  Overnight Events: No acute event  Patient is seen at bedside.  He appears comfortable and was eating ice popsicle.  Denies any overnight emesis episode.  States that his abdomen is feeling okay.  Reports some discharge in the ostomy bag.  Objective:  Vital signs in last 24 hours: Vitals:   06/05/20 2129 06/05/20 2129 06/06/20 0420 06/06/20 0500  BP: 126/82 126/82 120/78   Pulse: (!) 102 100 (!) 103   Resp:  18 18   Temp: 98.4 F (36.9 C) 98.4 F (36.9 C) 98.3 F (36.8 C)   TempSrc: Oral Oral Oral   SpO2: 93% 93% 95%   Weight:    58 kg  Height:       Supplemental O2: Room Air SpO2: 95 % O2 Flow Rate (L/min): 6 L/min   Physical Exam:  Physical Exam Constitutional:      General: He is not in acute distress. HENT:     Nose:     Comments: NG tube in place Eyes:     General:        Right eye: No discharge.        Left eye: No discharge.  Abdominal:     Tenderness: There is no abdominal tenderness. There is no guarding.     Comments: Abdominal distention improved.  Small amount of dark liquid discharge from ostomy back.   Skin:    General: Skin is warm.  Neurological:     Mental Status: He is alert.  Psychiatric:        Mood and Affect: Mood normal.     Filed Weights   06/04/20 0433 06/05/20 0500 06/06/20 0500  Weight: 52.5 kg 60.5 kg 58 kg     Intake/Output Summary (Last 24 hours) at 06/06/2020 1129 Last data filed at 06/06/2020 1043 Gross per 24 hour  Intake 2273.69 ml  Output 2510 ml  Net -236.31 ml   Net IO Since Admission: -6,580.71 mL [06/06/20 1129]  Pertinent Labs: CBC Latest Ref Rng & Units 06/06/2020 06/05/2020 06/04/2020  WBC 4.0 - 10.5 K/uL 9.4 11.2(H) 15.4(H)  Hemoglobin 13.0 - 17.0 g/dL 10.0(L) 9.9(L) 10.3(L)  Hematocrit 39.0 - 52.0 % 32.3(L) 33.1(L) 33.3(L)  Platelets 150 - 400 K/uL 462(H) 472(H) 460(H)    CMP Latest Ref Rng & Units 06/06/2020 06/05/2020 06/04/2020  Glucose 70 - 99 mg/dL 141(H) 110(H) 116(H)  BUN 8 -  23 mg/dL 10 9 10   Creatinine 0.61 - 1.24 mg/dL 0.54(L) 0.66 0.68  Sodium 135 - 145 mmol/L 134(L) 131(L) 129(L)  Potassium 3.5 - 5.1 mmol/L 3.5 3.8 3.9  Chloride 98 - 111 mmol/L 104 100 99  CO2 22 - 32 mmol/L 24 23 22   Calcium 8.9 - 10.3 mg/dL 7.9(L) 8.1(L) 8.1(L)  Total Protein 6.5 - 8.1 g/dL 5.0(L) - -  Total Bilirubin 0.3 - 1.2 mg/dL 1.0 - -  Alkaline Phos 38 - 126 U/L 53 - -  AST 15 - 41 U/L 13(L) - -  ALT 0 - 44 U/L 11 - -    Imaging: DG Abd Portable 1V  Result Date: 06/05/2020 CLINICAL DATA:  Nasogastric tube placement EXAM: PORTABLE ABDOMEN - 1 VIEW COMPARISON:  Portable exam 1242 hours compared to 06/02/2020 FINDINGS: Tip of nasogastric tube projects over gastric fundus. Lung bases clear. Visualized bowel gas pattern normal. Osseous structures unremarkable. IMPRESSION: Tip of nasogastric tube projects over gastric fundus. Electronically Signed   By: Lavonia Dana M.D.   On: 06/05/2020 14:20  Assessment/Plan:   Principal Problem:   sigmoid colon cancer Active Problems:   Symptomatic anemia   Iron deficiency anemia due to chronic blood loss   Duodenal ulcer   Gastritis and gastroduodenitis   Gastroesophageal reflux disease with esophagitis without hemorrhage   Esophageal stricture   Protein-calorie malnutrition, severe   Aortic atherosclerosis (HCC)   Ileus following gastrointestinal surgery [facility])   Patient Summary: Patient is 65 yo gentlemanwith past medical history of questionable COPD,past left5,6,8ribs fracture who presented to the ED for dizziness, dyspnea with exertion,likely due to symptomatic anemia, found to have duodenal ulcer and acolorectal mass.   Colorectal mass(T3B, N0, Mx) Post-op Ileus There was 35 cc of output from G-tube overnight.  His abdominal distention is improved and more discharge seen in the ostomy bag.  Hopefully his ileus will improve soon.  Wound care has been meeting with family and for ostomy bag education. -Pain controlled  with Tylenol, Percocet and morphine as needed.Got 1 dose of morphine overnight. -NPO - Continue TPN.  Monitor for refeeding syndrome.  Replete K, Phos, Mag if low -ContinueLR 50 cc/h. Reduce to 30 cc/h at 1800  -No adjuvant chemotherapy per oncology.  Follow-up with oncology in 1-2 months   Non-bleeding duodenal ulcer Grade B esophagitis without bleeding, gastritis s/p bx, moderate esophageal stenosis s/p dilation Likely in the context of excessive NSAID use, heavy alcohol use and smoking - continue sucrafate - continue protonix 40mg  IV BID (transition to p.o. when tolerate, 40mg  BID x10w, then reduce to 40mg  daily) - gastric pathologynegative for H. Pylori   Iron deficiencyanemia2/2 GI bleed. Hgbstable at 10.3. Patient received 1 does of Feraheme. Oncology recommended starting oral ferrous sulfate.  Will start when he can tolerate p.o.   Alcoholuse disorder -Continue thiamine and multivitamin   New lung densitieswhich are questioned to be post inflammatory vs atelectasis.   Diet:NPO, TPN VTE: Lovenox Code: Full Dispo:Pendingimprovement from postop ileus  Gaylan Gerold, DO 06/06/2020, 11:29 AM Pager: [phone]  Please contact the on call pager after 5 pm and on weekends at [phone].

## 2020-06-06 NOTE — Progress Notes (Signed)
7 Days Post-Op  Subjective: CC: Patient reports since NG tube placement his abdominal pain has improved.  He is only having mild pain around his colostomy.  Reports distention, nausea, and burping/belching has also resolved. Scant output from colostomy. Unclear if he mobilized yesterday. He is voiding without difficulty.   Objective: Vital signs in last 24 hours: Temp:  [98.2 F (36.8 C)-98.4 F (36.9 C)] 98.3 F (36.8 C) (04/13 0420) Pulse Rate:  [100-104] 103 (04/13 0420) Resp:  [17-18] 18 (04/13 0420) BP: (120-129)/(77-82) 120/78 (04/13 0420) SpO2:  [93 %-96 %] 95 % (04/13 0420) Weight:  [58 kg] 58 kg (04/13 0500) Last BM Date: 06/02/20  Intake/Output from previous day: 04/12 0701 - 04/13 0700 In: 2143.7 [I.V.:1983.9; IV Piggyback:159.8] Out: 4270 [Urine:475; Emesis/NG output:3550; Drains:170; Stool:75] Intake/Output this shift: No intake/output data recorded.  PE: Gen: Alert, NAD, pleasant HEENT: EOM's intact, pupils equal and round Card:Tachycardicin the low 100's (Last vitals with HR at 103) Pulm: Expiratory wheezing at the bases b/l. Normal rate and effort.  CWC:BJSEGBTDV distended and soft, tenderness near colostomy that appears appropriate in post operative setting. No peritonitis. Otherwise NT. Hypoactive bowel sounds. Incisions with glue in place and c/d/i. Stoma pink, budded and viable. Colostomy bag with scant output in bag - no stool or air. Drain RLQ SS (170cc/24 hours) Ext: No LE edema Psych: A&Ox3  Skin: no rashes noted, warm and dry  Lab Results:  Recent Labs    06/05/20 0254 06/06/20 0059  WBC 11.2* 9.4  HGB 9.9* 10.0*  HCT 33.1* 32.3*  PLT 472* 462*   BMET Recent Labs    06/05/20 0254 06/06/20 0059  NA 131* 134*  K 3.8 3.5  CL 100 104  CO2 23 24  GLUCOSE 110* 141*  BUN 9 10  CREATININE 0.66 0.54*  CALCIUM 8.1* 7.9*   PT/INR No results for input(s): LABPROT, INR in the last 72 hours. CMP     Component Value Date/Time    NA 134 (L) 06/06/2020 0059   K 3.5 06/06/2020 0059   CL 104 06/06/2020 0059   CO2 24 06/06/2020 0059   GLUCOSE 141 (H) 06/06/2020 0059   BUN 10 06/06/2020 0059   CREATININE 0.54 (L) 06/06/2020 0059   CALCIUM 7.9 (L) 06/06/2020 0059   PROT 5.0 (L) 06/06/2020 0059   ALBUMIN 1.9 (L) 06/06/2020 0059   AST 13 (L) 06/06/2020 0059   ALT 11 06/06/2020 0059   ALKPHOS 53 06/06/2020 0059   BILITOT 1.0 06/06/2020 0059   GFRNONAA >60 06/06/2020 0059   GFRAA >60 09/09/2019 0207   Lipase  No results found for: LIPASE     Studies/Results: CT ABDOMEN PELVIS W CONTRAST  Result Date: 06/04/2020 CLINICAL DATA:  Abdominal pain, fever, new diagnosis rectal cancer, postoperative rectosigmoid colon resection and left lower quadrant ostomy. EXAM: CT ABDOMEN AND PELVIS WITH CONTRAST TECHNIQUE: Multidetector CT imaging of the abdomen and pelvis was performed using the standard protocol following bolus administration of intravenous contrast. CONTRAST:  139mL OMNIPAQUE IOHEXOL 300 MG/ML  SOLN COMPARISON:  05/26/2019 FINDINGS: Lower chest: New atelectasis or consolidation of the right lung base (series 5, image 8). New trace left pleural effusion. Hepatobiliary: No solid liver abnormality is seen. Minimal sludge in the dependent gallbladder (series 3, image 26). Gallbladder wall thickening, or biliary dilatation. Pancreas: Unremarkable. No pancreatic ductal dilatation or surrounding inflammatory changes. Spleen: Normal in size without significant abnormality. Adrenals/Urinary Tract: Adrenal glands are unremarkable. Kidneys are normal, without renal calculi, solid lesion, or  hydronephrosis. Bladder is unremarkable. Stomach/Bowel: Stomach is within normal limits. Appendix appears normal. Status post interval rectosigmoid colon resection with left lower quadrant end ostomy and surgical drain in the low pelvis. The stomach and small bowel are mildly distended and fluid-filled throughout, largest loops measuring up to 3.8  cm. Vascular/Lymphatic: Aortic atherosclerosis. No enlarged abdominal or pelvic lymph nodes. Reproductive: No mass or other significant abnormality. Other: No abdominal wall hernia or abnormality. No abdominopelvic ascites. Musculoskeletal: No acute or significant osseous findings. IMPRESSION: 1. Status post interval rectosigmoid colon resection with left lower quadrant end ostomy and surgical drain in the low pelvis. 2. The stomach and small bowel are mildly distended and fluid-filled throughout, largest loops measuring up to 3.8 cm. Findings are consistent with postoperative ileus. 3. No evidence of postoperative fluid collection or other complication. 4. New atelectasis or consolidation of the right lung base. New trace left pleural effusion. Aortic Atherosclerosis (ICD10-I70.0). Electronically Signed   By: Eddie Candle M.D.   On: 06/04/2020 16:36   DG Abd Portable 1V  Result Date: 06/05/2020 CLINICAL DATA:  Nasogastric tube placement EXAM: PORTABLE ABDOMEN - 1 VIEW COMPARISON:  Portable exam 1242 hours compared to 06/02/2020 FINDINGS: Tip of nasogastric tube projects over gastric fundus. Lung bases clear. Visualized bowel gas pattern normal. Osseous structures unremarkable. IMPRESSION: Tip of nasogastric tube projects over gastric fundus. Electronically Signed   By: Lavonia Dana M.D.   On: 06/05/2020 14:20   [place] EKG SITE RITE  Result Date: 06/05/2020 If Site Rite image not attached, placement could not be confirmed due to current cardiac rhythm.   Anti-infectives: Anti-infectives (From admission, onward)   Start     Dose/Rate Route Frequency Ordered Stop   05/30/20 0600  cefoTEtan (CEFOTAN) 2 g in sodium chloride 0.9 % 100 mL IVPB        2 g 200 mL/hr over 30 Minutes Intravenous To Surgery 05/29/20 1430 05/30/20 1745   05/29/20 1400  neomycin (MYCIFRADIN) tablet 1,000 mg       "And" Linked Group Details   1,000 mg Oral 3 times per day 05/29/20 1152 05/29/20 2228   05/29/20 1400  metroNIDAZOLE  (FLAGYL) tablet 1,000 mg       "And" Linked Group Details   1,000 mg Oral 3 times per day 05/29/20 1152 05/29/20 2228       Assessment/Plan COPD - PRN nebs  Non-bleeding duodenal ulcer  Grade B esophogitis, gastritis, esophageal stenosis s/p dilation 4/2 Grade II internal hemorrhoids  Alcohol use disorder History of tobacco use ABL anemia- hgb stable at 10. Dr. Burr Medico recommended starting him on ferrous sulfate 325 mg twice daily when ileus resolves.  Protein calorie malnutrition - Pre-alb 8.0. On TPN.   Rectosigmoid colon cancer S/P robotic assisted LAR with end colostomy 05/30/20 Dr. Dema Severin POD#7 -Path=T3N0 adenocarcinoma. Seen by Dr. Burr Medico and has follow up scheduled for 4/11 - CT scan 4/11 w/ ileus. No postoperative fluid collection or other complication noted.  - Cont NGT to LIWS.  - Cont TPN - Keep K > 4 and Mg > 2 for bowel function. - Drain withSSoutput, continue.  -Appreciate WOC assistance with colostomy teaching.  -Encouraged OOB, ambulation. PT recommending no follow up post op. OT rec HH.  - Pulm toilet, IS  FEN: NGT, NPO, IVF per primary team, TPN, Mg 2.0, K 3.5 VTE: SCDs, Lovenox ID: neomycin/flagyl 4/5, cefotetan 4/6. None currently. WBC 9.4, afebrile.  Foley: removed POD1. Voiding.  Follow-Up- Dr. Dema Severin and Dr. Burr Medico   LOS:  12 days    [facility] 06/06/2020, 7:51 AM Please see Amion for pager number during day hours 7:00am-4:30pm

## 2020-06-06 NOTE — Progress Notes (Signed)
PHARMACY - TOTAL PARENTERAL NUTRITION CONSULT NOTE   Indication: Prolonged ileus  Patient Measurements: Height: 5\' 11"  (180.3 cm) Weight: 58 kg (127 lb 13.9 oz) IBW/kg (Calculated) : 75.3 TPN AdjBW (KG): 56.8 Body mass index is 17.83 kg/m. Usual Weight: ~59kg per chart, ~62kg per patient   Assessment:  65 year old man with rectosigmoid colo cancer s/p LAR with end colostomy 05/30/20. Patient tolerate 0-25% of CLD post op. Patient with a lot of NV. On 4/12 patient reported continued abdominal distention, occasional nausea, hiccups and burping. Nurse reports 400 ml emesis overnight and no colostomy output. Surgery team ordered NGT and PICC on 4/12. Weights have been variable 47.7-61.6kg  likely due to different scales used in hospital. Last standing weight was 54.9kg on 4/6 indicating about 5kg weight loss over 8 months. Very high risk for refeeding.  Glucose / Insulin: BG <150, A1C 5, used 2 units insulin/24hr Electrolytes: Na 134, K 3.5 (Goal >/=4), Mg 2 (Goal >/=2), COCA 9.5 others wnl Renal: Scr 0.5, BUN wnl Hepatic: LFTs wnl, albumin 1.9, prealbumin 8 Intake / Output; MIVF: LR at 50 ml/hr; UOP amount not fully charted. NGT 3579ml. Drain 141ml. Colostomy 72ml.  GI Imaging: 4/9 Abd XR: Moderate small bowel and gastric distention likely indicates postoperative ileus. 4/11 CT abd: fluid filled SB consistent w/ postoperative ileus 4/12 Abd XR: NGT placed GI Surgeries / Procedures:  4/6 Robotic assisted LAR with end colostomy  Central access: 4/12  TPN start date: 4/12    Nutritional Goals (per RD recommendation on 4/11): kCal: 2000-2200, Protein: 100-115g, Fluid: >/=2 L Goal TPN rate is 90 mL/hr (provides 101 g of protein and 2150 kcals per day)  Current Nutrition:  NPO, TPN   Plan:  Increase TPN to 49mL/hr at 1800 (provides 79gAA, 38g lipids, and 1673 kcals, estimated ~75% of needs)  Electrolytes in TPN: Decrease Mg 12 mEq/L, and increase Phos 12 mmol/L.  Continue (increases  with rate): Na 118mEq/L, K 58mEq/L, Ca 30mEq/L; Cl:Ac 1:1 Add standard MVI and trace elements to TPN Initiate Moderate q4h SSI and adjust as needed  Reduce MIVF to 30 mL/hr at 1800 Give KCl 10 mEq IV x4  Monitor TPN labs daily until stable at goal TPN then on Mon/Thurs Additional fluid per team   Benetta Spar, PharmD, BCPS, [place] Pharmacist  Please check AMION for all [place] phone numbers After 10:00 PM, call [place]

## 2020-06-07 LAB — CBC
HCT: 32.2 % — ABNORMAL LOW (ref 39.0–52.0)
Hemoglobin: 9.6 g/dL — ABNORMAL LOW (ref 13.0–17.0)
MCH: 27 pg (ref 26.0–34.0)
MCHC: 29.8 g/dL — ABNORMAL LOW (ref 30.0–36.0)
MCV: 90.7 fL (ref 80.0–100.0)
Platelets: 445 10*3/uL — ABNORMAL HIGH (ref 150–400)
RBC: 3.55 MIL/uL — ABNORMAL LOW (ref 4.22–5.81)
RDW: 24.3 % — ABNORMAL HIGH (ref 11.5–15.5)
WBC: 8.4 10*3/uL (ref 4.0–10.5)
nRBC: 0 % (ref 0.0–0.2)

## 2020-06-07 LAB — COMPREHENSIVE METABOLIC PANEL
ALT: 9 U/L (ref 0–44)
AST: 13 U/L — ABNORMAL LOW (ref 15–41)
Albumin: 1.7 g/dL — ABNORMAL LOW (ref 3.5–5.0)
Alkaline Phosphatase: 50 U/L (ref 38–126)
Anion gap: 5 (ref 5–15)
BUN: 9 mg/dL (ref 8–23)
CO2: 27 mmol/L (ref 22–32)
Calcium: 7.6 mg/dL — ABNORMAL LOW (ref 8.9–10.3)
Chloride: 104 mmol/L (ref 98–111)
Creatinine, Ser: 0.45 mg/dL — ABNORMAL LOW (ref 0.61–1.24)
GFR, Estimated: >60 mL/min (ref >60)
Glucose, Bld: 128 mg/dL — ABNORMAL HIGH (ref 70–99)
Potassium: 3.7 mmol/L (ref 3.5–5.1)
Sodium: 136 mmol/L (ref 135–145)
Total Bilirubin: 1.2 mg/dL (ref 0.3–1.2)
Total Protein: 4.6 g/dL — ABNORMAL LOW (ref 6.5–8.1)

## 2020-06-07 LAB — GLUCOSE, CAPILLARY
Glucose-Capillary: 122 mg/dL — ABNORMAL HIGH (ref 70–99)
Glucose-Capillary: 130 mg/dL — ABNORMAL HIGH (ref 70–99)
Glucose-Capillary: 138 mg/dL — ABNORMAL HIGH (ref 70–99)
Glucose-Capillary: 141 mg/dL — ABNORMAL HIGH (ref 70–99)
Glucose-Capillary: 143 mg/dL — ABNORMAL HIGH (ref 70–99)
Glucose-Capillary: 148 mg/dL — ABNORMAL HIGH (ref 70–99)

## 2020-06-07 LAB — PHOSPHORUS: Phosphorus: 2.6 mg/dL (ref 2.5–4.6)

## 2020-06-07 LAB — MAGNESIUM: Magnesium: 2.1 mg/dL (ref 1.7–2.4)

## 2020-06-07 MED ORDER — TRAVASOL 10 % IV SOLN
INTRAVENOUS | Status: DC
  Filled 2020-06-07: qty 1015.2

## 2020-06-07 MED ORDER — TRAVASOL 10 % IV SOLN
INTRAVENOUS | Status: AC
  Filled 2020-06-07: qty 1015.2

## 2020-06-07 MED ORDER — POTASSIUM CHLORIDE 10 MEQ/100ML IV SOLN
10.0000 meq | INTRAVENOUS | Status: AC
  Administered 2020-06-07 (×3): 10 meq via INTRAVENOUS
  Filled 2020-06-07 (×2): qty 100

## 2020-06-07 MED ORDER — POTASSIUM CHLORIDE 10 MEQ/100ML IV SOLN
INTRAVENOUS | Status: AC
  Filled 2020-06-07: qty 100

## 2020-06-07 MED ORDER — LACTATED RINGERS IV SOLN: INTRAVENOUS | Status: DC

## 2020-06-07 NOTE — Consult Note (Signed)
[name] Nurse ostomy follow up Patient receiving care in [place]. Patient's son, "Marc Mcdonald" at bedside for teaching. Stoma type/location: LUQ colostomy Stomal assessment/size: deferred, oval shaped Peristomal assessment: intact Treatment options for stomal/peristomal skin: barrier ring Output: scant green Ostomy pouching: 2pc. Flat with barrier ring Education provided:  At 103 patient's son removed existing pouch, cleaned around stoma, cut new skin barrier, closed new pouch, placed barrier ring and skin barrier, snapped on new pouch with very few prompts from me.  He expresses confidence in helping his father after discharge.  I told him I consider him "graduated" from ostomy teaching.  The son was able to complete the entire process almost completely independently by 0900. Enrolled patient in [place] Discharge program: Yes, previously by K. Sanders. Marc Riles, RN, MSN, CWOCN, CNS-BC, pager [phone]

## 2020-06-07 NOTE — Progress Notes (Signed)
OT Cancellation Note  Patient Details Name: Marc Mcdonald MRN: [id number] DOB: Jan 13, 1956   Cancelled Treatment:    Reason Eval/Treat Not Completed: Patient declined, no reason specified. Pt refused due to feeling tired and sickly, requested OT to follow up tomorrow at lunch time. OT will follow up as time allows.   Mariona Scholes H., OTR/L Acute Rehabilitation  Jeyli Zwicker Elane Yolanda Bonine 06/07/2020, 2:23 PM

## 2020-06-07 NOTE — Progress Notes (Signed)
HD#13 Subjective:  Overnight Events: no event  Patient is seen at bedside.  He appears comfortable and in no acute distress.  States that his son helped change his ostomy bag this morning.  States his abdomen feels better.  Plan to ambulate with PT today.  Objective:  Vital signs in last 24 hours: Vitals:   06/06/20 1248 06/06/20 1337 06/06/20 2028 06/07/20 0400  BP: 121/78 120/76 124/82 128/78  Pulse: (!) 108 (!) 110 (!) 109 (!) 108  Resp: 20 20 17 17   Temp: 98.4 F (36.9 C) 98.7 F (37.1 C) 98.3 F (36.8 C) 98.4 F (36.9 C)  TempSrc: Oral Oral Oral Oral  SpO2: 95% 96% 97% 97%  Weight:      Height:       Supplemental O2: Room Air SpO2: 97 % O2 Flow Rate (L/min): 6 L/min   Physical Exam:  Physical Exam Constitutional:      General: He is not in acute distress. HENT:     Head: Normocephalic.  Eyes:     General:        Right eye: No discharge.        Left eye: No discharge.  Abdominal:     General: There is no distension.     Tenderness: There is no abdominal tenderness.     Comments: Colostomy clean back the left lower quadrant.  The surrounding skin appears noninfected  Skin:    General: Skin is warm.  Neurological:     Mental Status: He is alert.  Psychiatric:        Mood and Affect: Mood normal.     Filed Weights   06/04/20 0433 06/05/20 0500 06/06/20 0500  Weight: 52.5 kg 60.5 kg 58 kg     Intake/Output Summary (Last 24 hours) at 06/07/2020 0732 Last data filed at 06/07/2020 0657 Gross per 24 hour  Intake 2520.71 ml  Output 2550 ml  Net -29.29 ml   Net IO Since Admission: -6,740 mL [06/07/20 0732]  Pertinent Labs: CBC Latest Ref Rng & Units 06/07/2020 06/06/2020 06/05/2020  WBC 4.0 - 10.5 K/uL 8.4 9.4 11.2(H)  Hemoglobin 13.0 - 17.0 g/dL 9.6(L) 10.0(L) 9.9(L)  Hematocrit 39.0 - 52.0 % 32.2(L) 32.3(L) 33.1(L)  Platelets 150 - 400 K/uL 445(H) 462(H) 472(H)    CMP Latest Ref Rng & Units 06/07/2020 06/06/2020 06/05/2020  Glucose 70 - 99 mg/dL  128(H) 141(H) 110(H)  BUN 8 - 23 mg/dL 9 10 9   Creatinine 0.61 - 1.24 mg/dL 0.45(L) 0.54(L) 0.66  Sodium 135 - 145 mmol/L 136 134(L) 131(L)  Potassium 3.5 - 5.1 mmol/L 3.7 3.5 3.8  Chloride 98 - 111 mmol/L 104 104 100  CO2 22 - 32 mmol/L 27 24 23   Calcium 8.9 - 10.3 mg/dL 7.6(L) 7.9(L) 8.1(L)  Total Protein 6.5 - 8.1 g/dL 4.6(L) 5.0(L) -  Total Bilirubin 0.3 - 1.2 mg/dL 1.2 1.0 -  Alkaline Phos 38 - 126 U/L 50 53 -  AST 15 - 41 U/L 13(L) 13(L) -  ALT 0 - 44 U/L 9 11 -    Imaging: No results found.  Assessment/Plan:   Principal Problem:   sigmoid colon cancer Active Problems:   Symptomatic anemia   Iron deficiency anemia due to chronic blood loss   Duodenal ulcer   Gastritis and gastroduodenitis   Gastroesophageal reflux disease with esophagitis without hemorrhage   Esophageal stricture   Protein-calorie malnutrition, severe   Aortic atherosclerosis (HCC)   Ileus following gastrointestinal surgery (HCC)   Hyponatremia  Patient Summary: Patient is 64 yo gentlemanwith past medical history of questionable COPD,past left5,6,8ribs fracture who presented to the ED for dizziness, dyspnea with exertion,likely due to symptomatic anemia, found to have duodenal ulcer and acolorectal mass s/p resection. Currently managing post-op ileus.    Colorectal mass(T3B, N0, Mx) Post-op Ileus Patient had 1700 cc of output from NG tube and 400 cc of output ostomy bag overnight.  Seems like his ileus is improving.  Plan for clamping trial tomorrow per surgery.  His son is confident with changing the ostomy bag per wound care. -Pain controlled with Tylenol, Percocet and morphine as needed.Got 1 dose of morphine overnight. -NPO -Continue TPN.Monitor for refeeding syndrome.  Replete K, Phos, Mag if low -ContinueLR 30 cc/h. Reduce to 10 cc/h at 1800 -Noadjuvant chemotherapy per oncology.  Follow-up with oncology in 1-2 months   Non-bleeding duodenal ulcer Grade B  esophagitis without bleeding, gastritis s/p bx, moderate esophageal stenosis s/p dilation Likely in the context of excessive NSAID use, heavy alcohol use and smoking - continue sucrafate - continue protonix 40mg IVBID (transition to p.o. when tolerate, 40mg  BIDx10w, then reduce to 40mg  daily) - gastric pathologynegative for H. Pylori   Iron deficiencyanemia2/2 GI bleed. Hgbstable. Patient received 1 does of Feraheme.Oncology recommended starting oral ferrous sulfate.Will start when he can tolerate p.o.   Alcoholuse disorder -Continue thiamine and multivitamin   New lung densitieswhich are questioned to be post inflammatory vs atelectasis.  -can repeat imaging outpatient to reevaluate   Diet:NPO, TPN XTK:WIOXBDZ Code: Full Dispo:Pendingimprovement from postop ileus  Gaylan Gerold, DO 06/07/2020, 7:32 AM Pager: [phone]  Please contact the on call pager after 5 pm and on weekends at [phone].

## 2020-06-07 NOTE — Progress Notes (Signed)
8 Days Post-Op  Subjective: CC: Patient reports that he feels better. Not really having any abdominal pain. Complains of pain in his nose from NGT. NGT with 1700cc of output in the last 24 hours. This appears bilious. He has taken in 190cc PO in the last 24 hours. He denies nausea. Started having some flatus and liquid stool in colostomy bag. He is voiding without difficulty. Son at bedside. Wants to mobilize more.   Objective: Vital signs in last 24 hours: Temp:  [98.3 F (36.8 C)-98.7 F (37.1 C)] 98.4 F (36.9 C) (04/14 0400) Pulse Rate:  [108-110] 108 (04/14 0400) Resp:  [17-20] 17 (04/14 0400) BP: (120-128)/(76-82) 128/78 (04/14 0400) SpO2:  [95 %-97 %] 97 % (04/14 0400) Last BM Date: 06/02/20  Intake/Output from previous day: 04/13 0701 - 04/14 0700 In: 2520.7 [P.O.:190; I.V.:1778.7; IV Piggyback:552] Out: 2550 [Urine:300; Emesis/NG output:1700; Drains:150; Stool:400] Intake/Output this shift: Total I/O In: 120 [P.O.:120] Out: 100 [Urine:100]  PE: Gen: Alert, NAD, pleasant HEENT: EOM's intact, pupils equal and round Card:Tachycardicin the low 100's (Last vitals with HR at 103) Pulm: Expiratory wheezing at the bases b/l. Normal rate and effort.  MPN:TIRWERXVQ distended and soft, tendernessnear colostomy that appears appropriate in post operative setting. Noperitonitis. Otherwise NT. Slightly hypoactive bowel sounds. Incisions with glue in place and c/d/i. Stoma pink, budded and viable. Colostomy bag with scant liquid stool in bag - reports it was just emptied. Drain RLQ SS(150cc/24 hours) Ext: No LE edema Psych: A&Ox3  Skin: no rashes noted, warm and dry  Lab Results:  Recent Labs    06/06/20 0059 06/07/20 0500  WBC 9.4 8.4  HGB 10.0* 9.6*  HCT 32.3* 32.2*  PLT 462* 445*   BMET Recent Labs    06/06/20 0059 06/07/20 0354  NA 134* 136  K 3.5 3.7  CL 104 104  CO2 24 27  GLUCOSE 141* 128*  BUN 10 9  CREATININE 0.54* 0.45*  CALCIUM 7.9* 7.6*    PT/INR No results for input(s): LABPROT, INR in the last 72 hours. CMP     Component Value Date/Time   NA 136 06/07/2020 0354   K 3.7 06/07/2020 0354   CL 104 06/07/2020 0354   CO2 27 06/07/2020 0354   GLUCOSE 128 (H) 06/07/2020 0354   BUN 9 06/07/2020 0354   CREATININE 0.45 (L) 06/07/2020 0354   CALCIUM 7.6 (L) 06/07/2020 0354   PROT 4.6 (L) 06/07/2020 0354   ALBUMIN 1.7 (L) 06/07/2020 0354   AST 13 (L) 06/07/2020 0354   ALT 9 06/07/2020 0354   ALKPHOS 50 06/07/2020 0354   BILITOT 1.2 06/07/2020 0354   GFRNONAA >60 06/07/2020 0354   GFRAA >60 09/09/2019 0207   Lipase  No results found for: LIPASE     Studies/Results: DG Abd Portable 1V  Result Date: 06/05/2020 CLINICAL DATA:  Nasogastric tube placement EXAM: PORTABLE ABDOMEN - 1 VIEW COMPARISON:  Portable exam 1242 hours compared to 06/02/2020 FINDINGS: Tip of nasogastric tube projects over gastric fundus. Lung bases clear. Visualized bowel gas pattern normal. Osseous structures unremarkable. IMPRESSION: Tip of nasogastric tube projects over gastric fundus. Electronically Signed   By: Lavonia Dana M.D.   On: 06/05/2020 14:20    Anti-infectives: Anti-infectives (From admission, onward)   Start     Dose/Rate Route Frequency Ordered Stop   05/30/20 0600  cefoTEtan (CEFOTAN) 2 g in sodium chloride 0.9 % 100 mL IVPB        2 g 200 mL/hr over 30 Minutes  Intravenous To Surgery 05/29/20 1430 05/30/20 1745   05/29/20 1400  neomycin (MYCIFRADIN) tablet 1,000 mg       "And" Linked Group Details   1,000 mg Oral 3 times per day 05/29/20 1152 05/29/20 2228   05/29/20 1400  metroNIDAZOLE (FLAGYL) tablet 1,000 mg       "And" Linked Group Details   1,000 mg Oral 3 times per day 05/29/20 1152 05/29/20 2228       Assessment/Plan COPD - PRN nebs  Non-bleeding duodenal ulcer  Grade B esophogitis, gastritis, esophageal stenosis s/p dilation 4/2 Grade II internal hemorrhoids  Alcohol use disorder History of tobacco use ABL  anemia- hgb stable at 9.6. Dr. Burr Medico recommended starting him on ferrous sulfate 325 mg twice daily when ileus resolves.  Protein calorie malnutrition - Pre-alb 8.0. On TPN.   Rectosigmoid colon cancer S/P robotic assisted LAR with end colostomy 05/30/20 Dr. Dema Severin POD#8 -Path=T3N0 adenocarcinoma.Seen byDr. Allena Napoleon arrange follow up.  -CT scan 4/11 w/ ileus. No postoperative fluid collection or other complication noted. -Cont NGT to LIWS. Patient did have some return of bowel function but NGT output still bilious with > 1L out in the last 24 hours. Hopefully ileus is improving and can do clamping trials tomorrow.  - Cont TPN - Keep K > 4 and Mg > 2 for bowel function. - Drain withSSoutput, continue.  -Appreciate WOC assistance with colostomy teaching. He has been set up for [facility] by CM.  -Encouraged OOB, ambulation. PT recommending no follow up post op. OT rec HH.  - Pulm toilet, IS  FEN: NGT,NPO, IVFper primary team, TPN, Mg 2.1, K 3.7, phos 2.6 VTE: SCDs, Lovenox ID: neomycin/flagyl 4/5, cefotetan 4/6. None currently. WBC 8.4, afebrile.  Foley: removed POD1. Voiding. Follow-Up- Dr. Dema Severin and Dr. Burr Medico   LOS: 13 days    Jillyn Ledger , [facility] Surgery 06/07/2020, 9:29 AM Please see Amion for pager number during day hours 7:00am-4:30pm

## 2020-06-07 NOTE — Progress Notes (Signed)
PHARMACY - TOTAL PARENTERAL NUTRITION CONSULT NOTE   Indication: Prolonged ileus  Patient Measurements: Height: 5\' 11"  (180.3 cm) Weight: 58 kg (127 lb 13.9 oz) IBW/kg (Calculated) : 75.3 TPN AdjBW (KG): 56.8 Body mass index is 17.83 kg/m. Usual Weight: ~59kg per chart, ~62kg per patient   Assessment:  65 year old man with rectosigmoid colo cancer s/p LAR with end colostomy 05/30/20. Patient tolerate 0-25% of CLD post op. Patient with a lot of NV. On 4/12 patient reported continued abdominal distention, occasional nausea, hiccups and burping. Nurse reports 400 ml emesis overnight and no colostomy output. Surgery team ordered NGT and PICC on 4/12. Weights have been variable 47.7-61.6kg  likely due to different scales used in hospital. Last standing weight was 54.9kg on 4/6 indicating about 5kg weight loss over 8 months. Very high risk for refeeding. Pharmacy consulted for TPN.  Glucose / Insulin: BG <150, A1C 5, used 6 units insulin/24hr Electrolytes: K 3.7 (Goal >/=4), Mg 2.1 (Goal >/=2), phos 2.6 down, CoCa 9.4 others wnl Renal: Scr 0.45, BUN wnl Hepatic: LFTs wnl, albumin 1.7, prealbumin 8, TG 58  Intake / Output; MIVF: LR at 30 ml/hr; UOP amount not fully charted. NGT 1700 ml. Drain 141ml. Colostomy 485ml.  GI Imaging: 4/9 Abd XR: Moderate small bowel and gastric distention likely indicates postoperative ileus. 4/11 CT abd: fluid filled SB consistent w/ postoperative ileus 4/12 Abd XR: NGT placed GI Surgeries / Procedures:  4/6 Robotic assisted LAR with end colostomy  Central access: 4/12  TPN start date: 4/12    Nutritional Goals (per RD recommendation on 4/11): kCal: 2000-2200, Protein: 100-115g, Fluid: >/=2 L Goal TPN rate is 90 mL/hr (provides 101 g of protein and 2150 kcals per day)  Current Nutrition:  NPO, TPN   Plan:  Increase TPN to goal 55mL/hr at 1800 (provides 101gAA, 50g lipids, and 2150 kcals, estimated ~100% of needs)  Electrolytes in TPN: Decrease Mg 10  mEq/L, Ca 65mEq/L; and increase Phos 17 mmol/L.  Continue (increases with rate): Na 170mEq/L, K 63mEq/L, Cl:Ac 1:1 Add standard MVI and trace elements to TPN Continue Moderate q4h SSI; can stop if not needing much at goal TPN  Reduce MIVF to 10 mL/hr at 1800 Give KCl 10 mEq IV x3  Monitor TPN labs daily until stable at goal TPN then on Mon/Thurs Additional fluid per team   Benetta Spar, PharmD, BCPS, [place] Pharmacist  Please check AMION for all [place] phone numbers After 10:00 PM, call [place] [phone]

## 2020-06-07 NOTE — Plan of Care (Signed)

## 2020-06-07 NOTE — Progress Notes (Signed)
PT Cancellation Note  Patient Details Name: Marc Mcdonald MRN: [id number] DOB: 05-24-1955   Cancelled Treatment:    Reason Eval/Treat Not Completed: Patient declined, no reason specified  Pt declined any activity today.  Tried to encourage/educated - reports "tomorrow".  Will f/u as able.  Abran Teondre, PT Acute Rehab Services Pager [phone] [facility] [place] 06/07/2020, 2:19 PM

## 2020-06-08 LAB — MAGNESIUM: Magnesium: 2 mg/dL (ref 1.7–2.4)

## 2020-06-08 LAB — BASIC METABOLIC PANEL
Anion gap: 3 — ABNORMAL LOW (ref 5–15)
BUN: 10 mg/dL (ref 8–23)
CO2: 28 mmol/L (ref 22–32)
Calcium: 7.5 mg/dL — ABNORMAL LOW (ref 8.9–10.3)
Chloride: 103 mmol/L (ref 98–111)
Creatinine, Ser: 0.38 mg/dL — ABNORMAL LOW (ref 0.61–1.24)
GFR, Estimated: >60 mL/min (ref >60)
Glucose, Bld: 125 mg/dL — ABNORMAL HIGH (ref 70–99)
Potassium: 3.8 mmol/L (ref 3.5–5.1)
Sodium: 134 mmol/L — ABNORMAL LOW (ref 135–145)

## 2020-06-08 LAB — GLUCOSE, CAPILLARY
Glucose-Capillary: 118 mg/dL — ABNORMAL HIGH (ref 70–99)
Glucose-Capillary: 131 mg/dL — ABNORMAL HIGH (ref 70–99)
Glucose-Capillary: 133 mg/dL — ABNORMAL HIGH (ref 70–99)
Glucose-Capillary: 141 mg/dL — ABNORMAL HIGH (ref 70–99)
Glucose-Capillary: 144 mg/dL — ABNORMAL HIGH (ref 70–99)
Glucose-Capillary: 145 mg/dL — ABNORMAL HIGH (ref 70–99)

## 2020-06-08 LAB — PHOSPHORUS: Phosphorus: 3.3 mg/dL (ref 2.5–4.6)

## 2020-06-08 MED ORDER — LIDOCAINE 5 % EX PTCH
1.0000 | MEDICATED_PATCH | CUTANEOUS | Status: DC
  Administered 2020-06-09 – 2020-06-21 (×11): 1 via TRANSDERMAL
  Filled 2020-06-08 (×11): qty 1

## 2020-06-08 MED ORDER — ACETAMINOPHEN 500 MG PO TABS
1000.0000 mg | ORAL_TABLET | Freq: Three times a day (TID) | ORAL | Status: DC
  Administered 2020-06-08 – 2020-06-12 (×9): 1000 mg via ORAL
  Filled 2020-06-08 (×12): qty 2

## 2020-06-08 MED ORDER — TRAVASOL 10 % IV SOLN
INTRAVENOUS | Status: AC
  Filled 2020-06-08: qty 1015.2

## 2020-06-08 MED ORDER — POTASSIUM CHLORIDE 10 MEQ/100ML IV SOLN
10.0000 meq | INTRAVENOUS | Status: AC
  Administered 2020-06-08 (×2): 10 meq via INTRAVENOUS
  Filled 2020-06-08 (×2): qty 100

## 2020-06-08 NOTE — Progress Notes (Signed)
PHARMACY - TOTAL PARENTERAL NUTRITION CONSULT NOTE   Indication: Prolonged ileus  Patient Measurements: Height: 5\' 11"  (180.3 cm) Weight: 58 kg (127 lb 13.9 oz) IBW/kg (Calculated) : 75.3 TPN AdjBW (KG): 56.8 Body mass index is 17.83 kg/m. 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 Patient tolerated 0-25% of CLD post op. Patient with a lot of N/V. On 4/12 patient reported continued abdominal distention, occasional nausea, hiccups and burping. Nurse reports 400 ml emesis overnight and no colostomy output. Surgery team ordered NGT and PICC on 4/12. Weights have been variable 47.7-61.6kg likely due to different scales used in hospital. Last standing weight was 54.9kg on 4/6 indicating about 5kg weight loss over 8 months. Very high risk for refeeding. Pharmacy consulted for TPN.  Glucose / Insulin: BG <150, A1C 5, used 5 units sensitive SSI/24hr Electrolytes: K 3.8 (Goal >/=4), Mg 2 (Goal >/=2), CoCa 9.3 others wnl Renal: Scr 0.38, BUN wnl Hepatic: LFTs wnl, albumin 1.7, prealbumin 8, TG 58  Intake / Output; MIVF: UOP amount not fully charted. NGT down 680 ml. Drain down 90 ml. No colostomy output charted. May clamp NGT today. GI Imaging: 4/9 Abd XR: Moderate small bowel and gastric distention likely indicates postoperative ileus. 4/11 CT abd: fluid filled SB consistent w/ postoperative ileus 4/12 Abd XR: NGT placed GI Surgeries / Procedures:  4/6 Robotic assisted LAR with end colostomy  Central access: 4/12  TPN start date: 4/12    Nutritional Goals (per RD recommendation on 4/11): kCal: 2000-2200, Protein: 100-115g, Fluid: >/=2 L Goal TPN rate is 90 mL/hr (provides 101 g of protein and 2150 kcals per day)  Current Nutrition:  NPO, TPN   Plan:  Continue TPN at goal 5mL/hr at 1800 (provides 101gAA, 50g lipids, and 2150 kcals, estimated ~100% of needs)  Electrolytes in TPN: Mg  10 mEq/L, Ca 14mEq/L; Phos 17 mmol/L.  Continue same today: Na 181mEq/L, K 49mEq/L, Cl:Ac 1:1 Add standard MVI and trace elements to TPN Continue Moderate q4h SSI; can stop if not needing much at goal TPN  Continue MIVF to 10 mL/hr at 1800 Give KCl 10 mEq IV x2 Monitor TPN labs daily until stable at goal TPN then on Mon/Thurs Additional fluid per team   Thank you for involving pharmacy in this patient's care.  Renold Genta, PharmD, BCPS Clinical Pharmacist Clinical phone for 06/08/2020 until 3p is [phone] 06/08/2020 7:39 AM  **Pharmacist phone directory can be found on [email or web address] listed under [place]**

## 2020-06-08 NOTE — Progress Notes (Signed)
PT Cancellation Note  Patient Details Name: Marc Mcdonald MRN: [id number] DOB: 25-Jul-1955   Cancelled Treatment:    Reason Eval/Treat Not Completed: (P) Patient declined, no reason specified (Pt worked with OT this pm and reports he is too tired to participate.  Provided education and encouragement and he continues to decline.)   Payden Docter J Shaterra Sanzone 06/08/2020, 4:12 PM  Erasmo Leventhal , PTA Acute Rehabilitation Services Pager [phone] Office [phone]

## 2020-06-08 NOTE — Progress Notes (Signed)
Nutrition Follow-up  DOCUMENTATION CODES:   Severe malnutrition in context of chronic illness,Underweight  INTERVENTION:   TPN order for Pharmacy to meet 100% of nutritional needs -Recommend continuing TPN until diet advanced to Soft, pt demonstrating multiple meal tolerance and meeting >50% of needs  Ensure Enlive po TID, each supplement provides 350 kcal and 20 grams of protein, once diet advanced    NUTRITION DIAGNOSIS:   Inadequate oral intake related to poor appetite,social / environmental circumstances,altered GI function as evidenced by NPO status.  Being addressed via TPN, diet advancement  GOAL:   Patient will meet greater than or equal to 90% of their needs  Progressing   MONITOR:   Diet advancement,PO intake,Labs,Weight trends  REASON FOR ASSESSMENT:   Consult Assessment of nutrition requirement/status  ASSESSMENT:   65 yo male admitted with symptomatic anemia, dyspnea on exertion, hyponatremia. PMH includes COPD, EtOH abuse (12-15 beers per day) and tobacco abuse (1 pack every other day)  4/06 Robotic assisted LAR with end colostomy 4/10 +emesis 4/11 CT abdomen: ileus 4/12 PICC line placed, NG placed for decompression  Surgical pathology: T3N0 adenocarcinoma; medical oncology following  Diet advanced to CL today, pt feels he is ready to eat something NG tube clamped but remains in place Nausea and bloating improved, +BM  TPN continues at goal rate   Lats weight 58 kg; noted wt 48 kg on 4/07  Labs: reviewed Meds: reviewed   Diet Order:   Diet Order            Diet full liquid Room service appropriate? Yes; Fluid consistency: Thin  Diet effective now                 EDUCATION NEEDS:   Not appropriate for education at this time  Skin:  Skin Assessment: Reviewed RN Assessment  Last BM:  4/15  Height:   Ht Readings from Last 1 Encounters:  05/26/20 5\' 11"  (1.803 m)    Weight:   Wt Readings from Last 1 Encounters:  06/06/20  58 kg    Ideal Body Weight:  78.2 kg  BMI:  Body mass index is 17.83 kg/m.  Estimated Nutritional Needs:   Kcal:  2000-2200 kcals  Protein:  100-115 g  Fluid:  >/= 2 L   Kerman Passey MS, RDN, LDN, CNSC Registered Dietitian III Clinical Nutrition RD Pager and On-Call Pager Number Located in [place]

## 2020-06-08 NOTE — Progress Notes (Signed)
HD#14 Subjective:  Overnight Events: No event    Patient is seen at bedside. He reports 1 episode of emesis overnight. He hopes that the NG tube will be removed. He reports leaking of the drain on the RLQ since last night.   When asked about his pain level, he mainly reports constant chronic back pain, rate 5/10.   Noted that his ostomy bag is full this morning. Drained by NT.   Objective:  Vital signs in last 24 hours: Vitals:   06/07/20 0400 06/07/20 1417 06/07/20 2015 06/08/20 0351  BP: 128/78 119/77 122/85 121/80  Pulse: (!) 108 (!) 102 (!) 110 (!) 108  Resp: 17 17 19 18   Temp: 98.4 F (36.9 C) 97.8 F (36.6 C) 98.5 F (36.9 C) 97.6 F (36.4 C)  TempSrc: Oral Oral Oral Oral  SpO2: 97% 96% 96% 95%  Weight:      Height:       Supplemental O2: Room Air SpO2: 95 % O2 Flow Rate (L/min): 6 L/min   Physical Exam:  Physical Exam Constitutional:      General: He is not in acute distress. HENT:     Head: Normocephalic.  Eyes:     General:        Right eye: No discharge.        Left eye: No discharge.  Abdominal:     Tenderness: There is no abdominal tenderness.     Comments: Ostomy bag was full this morning.  Fluid drain on the right side was leaking fluid.   Skin:    General: Skin is warm.  Neurological:     Mental Status: He is alert.  Psychiatric:        Mood and Affect: Mood normal.     Filed Weights   06/04/20 0433 06/05/20 0500 06/06/20 0500  Weight: 52.5 kg 60.5 kg 58 kg     Intake/Output Summary (Last 24 hours) at 06/08/2020 0712 Last data filed at 06/08/2020 0045 Gross per 24 hour  Intake 180 ml  Output 940 ml  Net -760 ml   Net IO Since Admission: -7,500 mL [06/08/20 0712]  Pertinent Labs: CBC Latest Ref Rng & Units 06/07/2020 06/06/2020 06/05/2020  WBC 4.0 - 10.5 K/uL 8.4 9.4 11.2(H)  Hemoglobin 13.0 - 17.0 g/dL 9.6(L) 10.0(L) 9.9(L)  Hematocrit 39.0 - 52.0 % 32.2(L) 32.3(L) 33.1(L)  Platelets 150 - 400 K/uL 445(H) 462(H) 472(H)     CMP Latest Ref Rng & Units 06/08/2020 06/07/2020 06/06/2020  Glucose 70 - 99 mg/dL 125(H) 128(H) 141(H)  BUN 8 - 23 mg/dL 10 9 10   Creatinine 0.61 - 1.24 mg/dL 0.38(L) 0.45(L) 0.54(L)  Sodium 135 - 145 mmol/L 134(L) 136 134(L)  Potassium 3.5 - 5.1 mmol/L 3.8 3.7 3.5  Chloride 98 - 111 mmol/L 103 104 104  CO2 22 - 32 mmol/L 28 27 24   Calcium 8.9 - 10.3 mg/dL 7.5(L) 7.6(L) 7.9(L)  Total Protein 6.5 - 8.1 g/dL - 4.6(L) 5.0(L)  Total Bilirubin 0.3 - 1.2 mg/dL - 1.2 1.0  Alkaline Phos 38 - 126 U/L - 50 53  AST 15 - 41 U/L - 13(L) 13(L)  ALT 0 - 44 U/L - 9 11    Imaging: No results found.  Assessment/Plan:   Principal Problem:   sigmoid colon cancer Active Problems:   Symptomatic anemia   Iron deficiency anemia due to chronic blood loss   Duodenal ulcer   Gastritis and gastroduodenitis   Gastroesophageal reflux disease with esophagitis without hemorrhage  Esophageal stricture   Protein-calorie malnutrition, severe   Aortic atherosclerosis (HCC)   Ileus following gastrointestinal surgery ([place])   Hyponatremia   Patient Summary: Patient is 65 yo gentlemanwith past medical history of questionable COPD,past left5,6,8ribs fracture who presented to the ED for dizziness, dyspnea with exertion,likely due to symptomatic anemia, found to have duodenal ulcer and acolorectal mass s/p resection. Currently managing post-op ileus.    Colorectal mass(T3B, N0, Mx) Post-op Ileus Patient only had400 cc of output from NG tube. Patient had good output from ostomy bag. Looks like his ileus has improved. Plan for clamping trial today per surgery.   - Will reach out to surgery about leaking drain -Pain controlled with Tylenol, Percocet and morphine as needed.Got 1 dose of morphine overnight. -Continue TPN.Monitor for refeeding syndrome.Replete K, Phos, Magif low -Noadjuvant chemotherapy per oncology.Follow-up with oncology in 1-71months   Non-bleeding duodenal  ulcer Grade B esophagitis without bleeding, gastritis s/p bx, moderate esophageal stenosis s/p dilation Likely in the context of excessive NSAID use, heavy alcohol use and smoking - continue sucrafate - continue protonix 40mg IVBID (transition to p.o. when tolerate, 40mg  BIDx10w, then reduce to 40mg  daily) - gastric pathologynegative for H. Pylori   Tachycardia  Appears sinus tachycardia on Telemetry. This is likely due to discomfort from his chronic back pain. Will make Tylenol scheduled and add Lidocaine patch for his lower back pain.  -Will obtain EKG if tachycardia persists.   Iron deficiencyanemia2/2 GI bleed. Hgbstable. Patient received 1 does of Feraheme.Oncology recommended starting oral ferrous sulfate.Will start when he can tolerate p.o.   Alcoholuse disorder -Continue thiamine and multivitamin   New lung densitieswhich are questioned to be post inflammatory vs atelectasis.  -can repeat imaging outpatient to reevaluate   Diet:NPO, TPN XTK:WIOXBDZ Code: Full Dispo:Pendingimprovement from postop ileus  Gaylan Gerold, DO 06/08/2020, 7:12 AM Pager: [phone]  Please contact the on call pager after 5 pm and on weekends at [phone].

## 2020-06-08 NOTE — Progress Notes (Signed)
Progress Note  9 Days Post-Op  Subjective: Patient reports abdominal pain is controlled. He is unsure if he had increased stool output. He would like to try clears today. He wants to leave.   Objective: Vital signs in last 24 hours: Temp:  [97.6 F (36.4 C)-98.5 F (36.9 C)] 97.6 F (36.4 C) (04/15 0351) Pulse Rate:  [102-110] 108 (04/15 0351) Resp:  [17-19] 18 (04/15 0351) BP: (119-122)/(77-85) 121/80 (04/15 0351) SpO2:  [95 %-96 %] 95 % (04/15 0351) Last BM Date: 06/06/20  Intake/Output from previous day: 04/14 0701 - 04/15 0700 In: 180 [P.O.:180] Out: 1320 [Urine:550; Emesis/NG output:680; Drains:90] Intake/Output this shift: Total I/O In: 200 [P.O.:200] Out: 550 [Stool:550]  PE: Gen: Alert, NAD HEENT: EOM's intact, pupils equal and round Pulm: Normal rate and effort. XTG:GYIRSWNIO distended andsoft, tendernessnearcolostomy thatappearsappropriate in post operative setting. Noperitonitis.Otherwise NT.Slightly hypoactive bowel sounds. Incisions with glue in place and c/d/i. Stoma pink, budded and viable. Colostomy bag with liquid stoolin bag. Drain RLQ SSwith some leakage around  Ext: No LE edema Psych: A&Ox3  Skin: no rashes noted, warm and dry   Lab Results:  Recent Labs    06/06/20 0059 06/07/20 0500  WBC 9.4 8.4  HGB 10.0* 9.6*  HCT 32.3* 32.2*  PLT 462* 445*   BMET Recent Labs    06/07/20 0354 06/08/20 0410  NA 136 134*  K 3.7 3.8  CL 104 103  CO2 27 28  GLUCOSE 128* 125*  BUN 9 10  CREATININE 0.45* 0.38*  CALCIUM 7.6* 7.5*   PT/INR No results for input(s): LABPROT, INR in the last 72 hours. CMP     Component Value Date/Time   NA 134 (L) 06/08/2020 0410   K 3.8 06/08/2020 0410   CL 103 06/08/2020 0410   CO2 28 06/08/2020 0410   GLUCOSE 125 (H) 06/08/2020 0410   BUN 10 06/08/2020 0410   CREATININE 0.38 (L) 06/08/2020 0410   CALCIUM 7.5 (L) 06/08/2020 0410   PROT 4.6 (L) 06/07/2020 0354   ALBUMIN 1.7 (L) 06/07/2020 0354    AST 13 (L) 06/07/2020 0354   ALT 9 06/07/2020 0354   ALKPHOS 50 06/07/2020 0354   BILITOT 1.2 06/07/2020 0354   GFRNONAA >60 06/08/2020 0410   GFRAA >60 09/09/2019 0207   Lipase  No results found for: LIPASE     Studies/Results: No results found.  Anti-infectives: Anti-infectives (From admission, onward)   Start     Dose/Rate Route Frequency Ordered Stop   05/30/20 0600  cefoTEtan (CEFOTAN) 2 g in sodium chloride 0.9 % 100 mL IVPB        2 g 200 mL/hr over 30 Minutes Intravenous To Surgery 05/29/20 1430 05/30/20 1745   05/29/20 1400  neomycin (MYCIFRADIN) tablet 1,000 mg       "And" Linked Group Details   1,000 mg Oral 3 times per day 05/29/20 1152 05/29/20 2228   05/29/20 1400  metroNIDAZOLE (FLAGYL) tablet 1,000 mg       "And" Linked Group Details   1,000 mg Oral 3 times per day 05/29/20 1152 05/29/20 2228       Assessment/Plan COPD- PRN nebs Non-bleeding duodenal ulcer  Grade B esophogitis, gastritis, esophageal stenosis s/p dilation 4/2 Grade II internal hemorrhoids  Alcohol use disorder History of tobacco use ABL anemia- hgb stable at 9.6. Dr. Burr Medico recommendedstarting him on ferrous sulfate 325 mg twice dailywhen ileus resolves.  Protein calorie malnutrition- Pre-alb 8.0. On TPN.   Rectosigmoid colon cancer S/P robotic assisted LAR  with end colostomy 05/30/20 Dr. Dema Severin POD#9 -Path=T3N0 adenocarcinoma.Seen byDr. Allena Napoleon arrange follow up.  -CT scan 4/11 w/ ileus. No postoperative fluid collection or other complication noted. -ok to clamp NGT today and trial CLD, would leave NGT clamped until tomorrow -ContTPN - Keep K > 4 and Mg > 2 for bowel function. - Drain withSSoutput, continue.  -Appreciate WOC assistance with colostomy teaching. He has been set up for [facility] by CM.  -Encouraged OOB, ambulation. PT recommending no follow up post op. OT rec HH. - Pulm toilet, IS  FEN: clamp NGT and trial CLD, TPN VTE: SCDs,Lovenox ID:  neomycin/flagyl 4/5, cefotetan 4/6. None currently. WBC 8.4, afebrile. Foley: removed POD1. Voiding. Follow-Up- Dr. Dema Severin and Dr. Burr Medico  LOS: 14 days    Norm Parcel , [facility] Surgery 06/08/2020, 11:08 AM Please see Amion for pager number during day hours 7:00am-4:30pm

## 2020-06-08 NOTE — Progress Notes (Signed)
Occupational Therapy Treatment Patient Details Name: Marc Mcdonald MRN: [id number] DOB: Jul 11, 1955 Today's Date: 06/08/2020    History of present illness Pt adm 4/1 with dizziness, weakness, and dyspnea. Pt with Hgb of 5.5 and found to have duodenal ulcer and rectal mass. Oncology and surgery involved and staging of rectal CA in process. Pt had surgery on 4/7 to place an End Ostomy. PMH - ?copd, lt rib fx's 08/2019, etoh   OT comments  Pt is demonstrating progress towards OT goals since ostomy surgery. Pt was able to stand from 2 low surfaces this session with no physical assistance. After functional mobility with a RW for approximately 7 minutes, pt became fatigued and needed a rest break. Pt will continue to progress with acute OT.    Follow Up Recommendations  Home health OT    Equipment Recommendations  Tub/shower seat    Recommendations for Other Services      Precautions / Restrictions Precautions Precautions: Fall;Other (comment) Precaution Comments: Pt has an NG tube and a JP Drain in place       Mobility Bed Mobility Overal bed mobility: Modified Independent Bed Mobility: Supine to Sit;Sit to Supine     Supine to sit: Modified independent (Device/Increase time) Sit to supine: Modified independent (Device/Increase time)        Transfers Overall transfer level: Needs assistance Equipment used: Rolling walker (2 wheeled) Transfers: Sit to/from Stand Sit to Stand: Min guard         General transfer comment: Pt able to power up to standing and control sitting with no physical assistance, min guard for safety.    Balance Overall balance assessment: Needs assistance Sitting-balance support: No upper extremity supported;Feet supported Sitting balance-Leahy Scale: Good     Standing balance support: Bilateral upper extremity supported Standing balance-Leahy Scale: Fair Standing balance comment: In standing pt heavily reliant on BUE support w/ RW to maintain  balance.                           ADL either performed or assessed with clinical judgement   ADL Overall ADL's : Needs assistance/impaired Eating/Feeding: Independent;Sitting Eating/Feeding Details (indicate cue type and reason): Pt able to open can of soda and bring to his mouth to take a sip. Grooming: Wash/dry hands;Standing Grooming Details (indicate cue type and reason): completed standing at sink         Upper Body Dressing : Sitting;Modified independent Upper Body Dressing Details (indicate cue type and reason): Pt able to don hospital gown on back     Toilet Transfer: Min guard;RW;Comfort height toilet Toilet Transfer Details (indicate cue type and reason): Pt ambulated to bathroom w/ RW, needs cues for RW usage. uses grab bar on L side to assist with powering up to stand         Functional mobility during ADLs: Min guard;Rolling walker General ADL Comments: Pt able to power up from sitting with no physical assistance. Still needs education and cueing on RW usage.     Vision       Perception     Praxis      Cognition Arousal/Alertness: Awake/alert Behavior During Therapy: WFL for tasks assessed/performed Overall Cognitive Status: Within Functional Limits for tasks assessed  Exercises     Shoulder Instructions       General Comments VSS on RA    Pertinent Vitals/ Pain       Pain Assessment: 0-10 Pain Score: 5  Pain Location: Throat and abdomen Pain Descriptors / Indicators: Grimacing;Guarding Pain Intervention(s): Monitored during session;Repositioned;Patient requesting pain meds-RN notified  Home Living                                          Prior Functioning/Environment              Frequency  Min 2X/week        Progress Toward Goals  OT Goals(current goals can now be found in the care plan section)  Progress towards OT goals: Progressing  toward goals  Acute Rehab OT Goals Patient Stated Goal: go home OT Goal Formulation: With patient Time For Goal Achievement: 06/11/20 Potential to Achieve Goals: Good ADL Goals Pt Will Perform Grooming: Independently;standing Additional ADL Goal #1: Pt will perform UE and LE dressing sitting, with mod I, using AE as needed. Additional ADL Goal #2: Pt will report and demonstrate 3 fall prevention techniques.  Plan Discharge plan remains appropriate;Frequency remains appropriate    Co-evaluation                 AM-PAC OT "6 Clicks" Daily Activity     Outcome Measure   Help from another person eating meals?: None Help from another person taking care of personal grooming?: A Little Help from another person toileting, which includes using toliet, bedpan, or urinal?: A Little Help from another person bathing (including washing, rinsing, drying)?: A Little Help from another person to put on and taking off regular upper body clothing?: None Help from another person to put on and taking off regular lower body clothing?: A Lot 6 Click Score: 19    End of Session Equipment Utilized During Treatment: Gait belt;Rolling walker  OT Visit Diagnosis: Unsteadiness on feet (R26.81);Other abnormalities of gait and mobility (R26.89);Muscle weakness (generalized) (M62.81)   Activity Tolerance Patient tolerated treatment well   Patient Left in bed;with call bell/phone within reach;with bed alarm set   Nurse Communication Mobility status        Time: 9562-1308 OT Time Calculation (min): 18 min  Charges: OT General Charges $OT Visit: 1 Visit OT Treatments $Self Care/Home Management : 8-22 mins  Jaquia Benedicto H., OTR/L Acute Rehabilitation  Aniyah Nobis Elane Lydian Chavous 06/08/2020, 4:55 PM

## 2020-06-09 LAB — BASIC METABOLIC PANEL
Anion gap: 5 (ref 5–15)
BUN: 10 mg/dL (ref 8–23)
CO2: 28 mmol/L (ref 22–32)
Calcium: 7.5 mg/dL — ABNORMAL LOW (ref 8.9–10.3)
Chloride: 103 mmol/L (ref 98–111)
Creatinine, Ser: 0.41 mg/dL — ABNORMAL LOW (ref 0.61–1.24)
GFR, Estimated: >60 mL/min (ref >60)
Glucose, Bld: 134 mg/dL — ABNORMAL HIGH (ref 70–99)
Potassium: 4 mmol/L (ref 3.5–5.1)
Sodium: 136 mmol/L (ref 135–145)

## 2020-06-09 LAB — GLUCOSE, CAPILLARY
Glucose-Capillary: 118 mg/dL — ABNORMAL HIGH (ref 70–99)
Glucose-Capillary: 129 mg/dL — ABNORMAL HIGH (ref 70–99)
Glucose-Capillary: 131 mg/dL — ABNORMAL HIGH (ref 70–99)
Glucose-Capillary: 133 mg/dL — ABNORMAL HIGH (ref 70–99)
Glucose-Capillary: 138 mg/dL — ABNORMAL HIGH (ref 70–99)
Glucose-Capillary: 152 mg/dL — ABNORMAL HIGH (ref 70–99)

## 2020-06-09 LAB — PHOSPHORUS: Phosphorus: 3.3 mg/dL (ref 2.5–4.6)

## 2020-06-09 LAB — MAGNESIUM: Magnesium: 1.9 mg/dL (ref 1.7–2.4)

## 2020-06-09 MED ORDER — PANTOPRAZOLE SODIUM 40 MG PO TBEC
40.0000 mg | DELAYED_RELEASE_TABLET | Freq: Two times a day (BID) | ORAL | Status: DC
  Administered 2020-06-09 – 2020-06-11 (×4): 40 mg via ORAL
  Filled 2020-06-09 (×4): qty 1

## 2020-06-09 MED ORDER — ENSURE ENLIVE PO LIQD: 237.0000 mL | Freq: Three times a day (TID) | ORAL | Status: DC

## 2020-06-09 MED ORDER — TRAVASOL 10 % IV SOLN
INTRAVENOUS | Status: DC
  Filled 2020-06-09: qty 1015.2

## 2020-06-09 NOTE — Progress Notes (Signed)
    HD#15 Subjective:  No significant overnight events. Tolerated NG clamping yesterday.  During evaluation this morning, patient states he can't wait to get out of the hospital. Reports he has a cough at baseline from his COPD. Denies nausea, vomiting. Is relieved NG tube is out.   Objective:  Vital signs in last 24 hours: Vitals:   06/08/20 0351 06/08/20 1504 06/08/20 2030 06/09/20 0427  BP: 121/80 134/87 123/89 (!) 145/85  Pulse: (!) 108 (!) 106 100 (!) 110  Resp: 18 16 18 17   Temp: 97.6 F (36.4 C) 98.4 F (36.9 C) 98.2 F (36.8 C) 98.6 F (37 C)  TempSrc: Oral Oral Oral Oral  SpO2: 95% 96% 96% 96%  Weight:      Height:       General: chronically ill appearing Pulm: breathing comfortably on room air. Diffuse rhonchi great at the bases that improved with coughing.  GI: not distended. No tenderness. Small volume dark stool in ostomy bag.  Assessment/Plan:   Principal Problem:   sigmoid colon cancer Active Problems:   Symptomatic anemia   Iron deficiency anemia due to chronic blood loss   Duodenal ulcer   Gastritis and gastroduodenitis   Gastroesophageal reflux disease with esophagitis without hemorrhage   Esophageal stricture   Protein-calorie malnutrition, severe   Aortic atherosclerosis (HCC)   Ileus following gastrointestinal surgery ([place])   Hyponatremia   Patient Summary: Patient is 65 yo gentlemanwith past medical history of questionable COPD,past left5,6,8ribs fracture who presented to the ED for dizziness, dyspnea with exertion,likely due to symptomatic anemia, found to have duodenal ulcer and acolorectal mass s/p resection. Currently managing post-op ileus.    Colorectal mass(T3B, N0, Mx) s/p anterior resection with colostomy formation 4/6. Complicated by post op ileus which has since resolved Tolerated clear liquids yesterday. NG out this morning JP removed this morning Post op anemia, chronic anemia 2/2 malignancy stable -management per  surgery. Diet advanced to full liquid.  -medically stable for discharge pending surgery sign off -check cbc in AM -will need f/u with oncology in 1-2 months -start iron supplementation after tolerating orals. Consider another feraheme transfusion this admission  Non-bleeding duodenal ulcer Grade B esophagitis without bleeding, gastritis s/p bx, moderate esophageal stenosis s/p dilation Likely in the context of excessive NSAID use, heavy alcohol use and smoking - continue sucrafate - continue protonix 40mg IVBID (transition to p.o. when tolerate, 40mg  BIDx10w, then reduce to 40mg  daily) - gastric pathologynegative for H. Pylori -f/u with GI after discharge  New lung densitieswhich are questioned to be post inflammatory vs atelectasis.  -can repeat imaging outpatient to reevaluate   FXJ:[id number] Code: Full Dispo:1-2d  Mitzi Hansen, MD Internal Medicine Resident PGY-2 [name] Pager: [phone] Please contact the on call pager after 5 pm and on weekends at [phone]. 06/09/2020 5:07 PM

## 2020-06-09 NOTE — Progress Notes (Signed)
PHARMACY - TOTAL PARENTERAL NUTRITION CONSULT NOTE   Indication: Prolonged ileus  Patient Measurements: Height: 5\' 11"  (180.3 cm) Weight: 58 kg (127 lb 13.9 oz) IBW/kg (Calculated) : 75.3 TPN AdjBW (KG): 56.8 Body mass index is 17.83 kg/m. 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 Patient tolerated 0-25% of CLD post op. Patient with a lot of N/V. On 4/12 patient reported continued abdominal distention, occasional nausea, hiccups and burping. Nurse reports 400 ml emesis overnight and no colostomy output. Surgery team ordered NGT and PICC on 4/12. Weights have been variable 47.7-61.6kg likely due to different scales used in hospital. Last standing weight was 54.9kg on 4/6 indicating about 5kg weight loss over 8 months. Very high risk for refeeding. Pharmacy consulted for TPN.  Glucose / Insulin: BG <150, A1C 5, used 6 units sensitive SSI/24hr Electrolytes: K 3.8 (Goal >/=4), Mg 2 (Goal >/=2), CoCa 9.3 others wnl Renal: Scr 0.38, BUN wnl Hepatic: LFTs wnl, albumin 1.7, prealbumin 8, TG 58  Intake / Output; MIVF: UOP amount not fully charted. NGT clamped 4/15, removed today. Drain down 15 ml, removed today. Colostomy output 733ml. GI Imaging: 4/9 Abd XR: Moderate small bowel and gastric distention likely indicates postoperative ileus. 4/11 CT abd: fluid filled SB consistent w/ postoperative ileus 4/12 Abd XR: NGT placed GI Surgeries / Procedures:  4/6 Robotic assisted LAR with end colostomy  Central access: 4/12  TPN start date: 4/12    Nutritional Goals (per RD recommendation on 4/11): kCal: 2000-2200, Protein: 100-115g, Fluid: >/=2 L Goal TPN rate is 90 mL/hr (provides 101 g of protein and 2150 kcals per day)  Current Nutrition:  Clear liquids - %consumed not charted TPN  Plan:  Continue TPN at goal 83mL/hr at 1800 (provides 101gAA, 50g lipids, and 2150 kcals, estimated ~100%  of needs)  Electrolytes in TPN: Mg 10 mEq/L, Ca 74mEq/L; Phos 17 mmol/L.  Continue same today: Na 129mEq/L, K 36mEq/L, Cl:Ac 1:1 Add standard MVI and trace elements to TPN Continue Moderate q4h SSI; can stop if not needing much at goal TPN  Continue MIVF to 10 mL/hr at 1800 Monitor TPN labs on Mon/Thurs F/U ability to advance diet and wean TPN   Thank you for involving pharmacy in this patient's care.  Renold Genta, PharmD, BCPS Clinical Pharmacist Clinical phone for 06/09/2020 until 3p is [phone] 06/09/2020 7:32 AM  **Pharmacist phone directory can be found on [email or web address] listed under [place]**

## 2020-06-09 NOTE — Progress Notes (Signed)
Progress Note  10 Days Post-Op  Subjective: Patient tolerating NGT clamped and denied nausea or vomiting overnight. Having good output from colostomy. Some leakage around JP drain. Ambulated yesterday.   Objective: Vital signs in last 24 hours: Temp:  [98.2 F (36.8 C)-98.6 F (37 C)] 98.6 F (37 C) (04/16 0427) Pulse Rate:  [100-110] 110 (04/16 0427) Resp:  [16-18] 17 (04/16 0427) BP: (123-145)/(85-89) 145/85 (04/16 0427) SpO2:  [96 %] 96 % (04/16 0427) Last BM Date: 06/08/20  Intake/Output from previous day: 04/15 0701 - 04/16 0700 In: 1935.3 [P.O.:320; I.V.:1310.3; IV Piggyback:300] Out: 1340 [Urine:600; Drains:15; Stool:725] Intake/Output this shift: No intake/output data recorded.  PE: Gen: Alert, NAD HEENT: EOM's intact, pupils equal and round Pulm: Normal rate and effort. OBS:JGGEZMOQH distended andsoft, tendernessnearcolostomy thatappearsappropriate in post operative setting. Noperitonitis.Otherwise NT.Slightly hypoactive bowel sounds. Incisions with glue in place and c/d/i. Stoma pink, budded and viable. Colostomy bag withliquid stoolin bag. Drain RLQ SSwith some leakage around  Ext: No LE edema Psych: A&Ox3  Skin: no rashes noted, warm and dry   Lab Results:  Recent Labs    06/07/20 0500  WBC 8.4  HGB 9.6*  HCT 32.2*  PLT 445*   BMET Recent Labs    06/07/20 0354 06/08/20 0410  NA 136 134*  K 3.7 3.8  CL 104 103  CO2 27 28  GLUCOSE 128* 125*  BUN 9 10  CREATININE 0.45* 0.38*  CALCIUM 7.6* 7.5*   PT/INR No results for input(s): LABPROT, INR in the last 72 hours. CMP     Component Value Date/Time   NA 134 (L) 06/08/2020 0410   K 3.8 06/08/2020 0410   CL 103 06/08/2020 0410   CO2 28 06/08/2020 0410   GLUCOSE 125 (H) 06/08/2020 0410   BUN 10 06/08/2020 0410   CREATININE 0.38 (L) 06/08/2020 0410   CALCIUM 7.5 (L) 06/08/2020 0410   PROT 4.6 (L) 06/07/2020 0354   ALBUMIN 1.7 (L) 06/07/2020 0354   AST 13 (L) 06/07/2020 0354    ALT 9 06/07/2020 0354   ALKPHOS 50 06/07/2020 0354   BILITOT 1.2 06/07/2020 0354   GFRNONAA >60 06/08/2020 0410   GFRAA >60 09/09/2019 0207   Lipase  No results found for: LIPASE     Studies/Results: No results found.  Anti-infectives: Anti-infectives (From admission, onward)   Start     Dose/Rate Route Frequency Ordered Stop   05/30/20 0600  cefoTEtan (CEFOTAN) 2 g in sodium chloride 0.9 % 100 mL IVPB        2 g 200 mL/hr over 30 Minutes Intravenous To Surgery 05/29/20 1430 05/30/20 1745   05/29/20 1400  neomycin (MYCIFRADIN) tablet 1,000 mg       "And" Linked Group Details   1,000 mg Oral 3 times per day 05/29/20 1152 05/29/20 2228   05/29/20 1400  metroNIDAZOLE (FLAGYL) tablet 1,000 mg       "And" Linked Group Details   1,000 mg Oral 3 times per day 05/29/20 1152 05/29/20 2228       Assessment/Plan COPD- PRN nebs Non-bleeding duodenal ulcer  Grade B esophogitis, gastritis, esophageal stenosis s/p dilation 4/2 Grade II internal hemorrhoids  Alcohol use disorder History of tobacco use ABL anemia- hgb stableat 9.6. Dr. Burr Medico recommendedstarting him on ferrous sulfate 325 mg twice dailywhen ileus resolves.  Protein calorie malnutrition- Pre-alb 8.0. On TPN.   Rectosigmoid colon cancer S/P robotic assisted LAR with end colostomy 05/30/20 Dr. Dema Severin POD#10 -Path=T3N0 adenocarcinoma.Seen byDr. Allena Napoleon arrange follow up. -CT  scan 4/11 w/ ileus. No postoperative fluid collection or other complication noted. -removed NGT and advance to FLD -ContTPN - Keep K > 4 and Mg > 2 for bowel function. - remove JP  -Appreciate WOC assistance with colostomy teaching.He has been set up for [facility] by CM. -Encouraged OOB, ambulation. PT recommending no follow up post op. OT rec HH. - Pulm toilet, IS  FEN: FLD, TPN VTE: SCDs,Lovenox ID: neomycin/flagyl 4/5, cefotetan 4/6. None currently. WBC8.4, afebrile. Foley: removed POD1. Voiding. Follow-Up- Dr. Dema Severin  and Dr. Burr Medico  LOS: 15 days    Norm Parcel , [facility] 06/09/2020, 9:27 AM Please see Amion for pager number during day hours 7:00am-4:30pm

## 2020-06-09 NOTE — Progress Notes (Signed)
Physical Therapy Treatment Patient Details Name: Marc Mcdonald MRN: [id number] DOB: [birth year] Today's Date: 06/09/2020    History of Present Illness Pt adm 4/1 with dizziness, weakness, and dyspnea. Pt with Hgb of 5.5 and found to have duodenal ulcer and rectal mass. Oncology and surgery involved and staging of rectal CA in process. Pt had surgery on 4/7 to place an End Ostomy. PMH - ?copd, lt rib fx's 08/2019, etoh    PT Comments    Pt agreeable to session with focus on transfers and short bouts of ambulation with reduced assist in preparation for anticipated d/c home. The pt was able to complete initial sit-stand transfer with minG and use of RW, but did require minA from low recliner. The pt was also able to complete short bouts of ambulation in the room without physical assist or overt LOB. Reports he is ready for d/c home with no concerns regarding mobility at this time. Pt educated on home exercise and progressive walking program to maintain strength and independence, pt verbalized understanding.    Follow Up Recommendations  No PT follow up     Equipment Recommendations  None recommended by PT    Recommendations for Other Services       Precautions / Restrictions Precautions Precautions: Fall;Other (comment) Precaution Comments: Pt has JP Drain R lower abdomen Restrictions Weight Bearing Restrictions: No    Mobility  Bed Mobility Overal bed mobility: Modified Independent Bed Mobility: Supine to Sit     Supine to sit: Modified independent (Device/Increase time)          Transfers Overall transfer level: Needs assistance Equipment used: Rolling walker (2 wheeled) Transfers: Sit to/from Stand Sit to Stand: Min guard;Min assist         General transfer comment: minG from EOB, minA to power up from lower recliner surface  Ambulation/Gait Ambulation/Gait assistance: Min guard Gait Distance (Feet): 10 Feet Assistive device: Rolling walker (2 wheeled) Gait  Pattern/deviations: Step-through pattern;Decreased stride length;Drifts right/left;Wide base of support Gait velocity: decr   General Gait Details: steady with use of RW, no physical assist   Stairs             Wheelchair Mobility    Modified Rankin (Stroke Patients Only)       Balance Overall balance assessment: Needs assistance Sitting-balance support: No upper extremity supported;Feet supported Sitting balance-Leahy Scale: Good     Standing balance support: Bilateral upper extremity supported Standing balance-Leahy Scale: Fair Standing balance comment: In standing pt heavily reliant on BUE support w/ RW to maintain balance.                            Cognition Arousal/Alertness: Awake/alert Behavior During Therapy: WFL for tasks assessed/performed Overall Cognitive Status: Within Functional Limits for tasks assessed                                        Exercises      General Comments General comments (skin integrity, edema, etc.): VSS on RA, pt found soiled and on wet gowns/bed pad. cleaned and donned fresh gown/linnens      Pertinent Vitals/Pain Pain Assessment: Faces Faces Pain Scale: Hurts a little bit Pain Location: Throat and abdomen Pain Descriptors / Indicators: Grimacing;Guarding Pain Intervention(s): Limited activity within patient's tolerance;Monitored during session;Repositioned           PT  Goals (current goals can now be found in the care plan section) Acute Rehab PT Goals Patient Stated Goal: go home PT Goal Formulation: With patient Time For Goal Achievement: 06/11/20 Potential to Achieve Goals: Good Progress towards PT goals: Progressing toward goals    Frequency    Min 3X/week      PT Plan Current plan remains appropriate       AM-PAC PT "6 Clicks" Mobility   Outcome Measure  Help needed turning from your back to your side while in a flat bed without using bedrails?: A Little Help needed  moving from lying on your back to sitting on the side of a flat bed without using bedrails?: A Little Help needed moving to and from a bed to a chair (including a wheelchair)?: A Little Help needed standing up from a chair using your arms (e.g., wheelchair or bedside chair)?: A Little Help needed to walk in hospital room?: A Little Help needed climbing 3-5 steps with a railing? : A Little 6 Click Score: 18    End of Session Equipment Utilized During Treatment: Gait belt Activity Tolerance: Patient tolerated treatment well Patient left: with call bell/phone within reach;in chair Nurse Communication: Mobility status PT Visit Diagnosis: Unsteadiness on feet (R26.81);Muscle weakness (generalized) (M62.81);History of falling (Z91.81)     Time: 7672-0947 PT Time Calculation (min) (ACUTE ONLY): 23 min  Charges:  $Gait Training: 8-22 mins $Therapeutic Activity: 8-22 mins                     Karma Ganja, PT, DPT   Acute Rehabilitation Department Pager #: [phone]   Otho Bellows 06/09/2020, 5:19 PM

## 2020-06-10 LAB — GLUCOSE, CAPILLARY
Glucose-Capillary: 107 mg/dL — ABNORMAL HIGH (ref 70–99)
Glucose-Capillary: 115 mg/dL — ABNORMAL HIGH (ref 70–99)
Glucose-Capillary: 129 mg/dL — ABNORMAL HIGH (ref 70–99)
Glucose-Capillary: 135 mg/dL — ABNORMAL HIGH (ref 70–99)
Glucose-Capillary: 136 mg/dL — ABNORMAL HIGH (ref 70–99)

## 2020-06-10 LAB — CBC
HCT: 28.6 % — ABNORMAL LOW (ref 39.0–52.0)
Hemoglobin: 8.5 g/dL — ABNORMAL LOW (ref 13.0–17.0)
MCH: 27.4 pg (ref 26.0–34.0)
MCHC: 29.7 g/dL — ABNORMAL LOW (ref 30.0–36.0)
MCV: 92.3 fL (ref 80.0–100.0)
Platelets: 412 10*3/uL — ABNORMAL HIGH (ref 150–400)
RBC: 3.1 MIL/uL — ABNORMAL LOW (ref 4.22–5.81)
RDW: 24.5 % — ABNORMAL HIGH (ref 11.5–15.5)
WBC: 5.9 10*3/uL (ref 4.0–10.5)
nRBC: 0 % (ref 0.0–0.2)

## 2020-06-10 MED ORDER — OXYCODONE HCL 5 MG PO TABS
5.0000 mg | ORAL_TABLET | ORAL | Status: DC | PRN
  Administered 2020-06-10: 5 mg via ORAL
  Filled 2020-06-10: qty 1

## 2020-06-10 MED ORDER — METHOCARBAMOL 500 MG PO TABS
500.0000 mg | ORAL_TABLET | Freq: Three times a day (TID) | ORAL | Status: DC
  Administered 2020-06-10 (×2): 500 mg via ORAL
  Filled 2020-06-10 (×4): qty 1

## 2020-06-10 NOTE — Progress Notes (Addendum)
HD#16 Subjective:  Overnight Events: no event  Patient is seen at bedside.  He appears comfortable in no acute distress.  States that he is tolerated p.o. intake well without any nausea or vomiting.  Objective:  Vital signs in last 24 hours: Vitals:   06/09/20 0427 06/09/20 1142 06/09/20 2056 06/10/20 0359  BP: (!) 145/85 122/85 120/80 132/84  Pulse: (!) 110 (!) 110 (!) 108 (!) 107  Resp: 17 18 18 17   Temp: 98.6 F (37 C) 99.5 F (37.5 C) 98.1 F (36.7 C) 98.6 F (37 C)  TempSrc: Oral Oral  Oral  SpO2: 96% 95% 97% 97%  Weight:      Height:       Supplemental O2: Room Air SpO2: 97 % O2 Flow Rate (L/min): 6 L/min   Physical Exam:  Physical Exam Constitutional:      General: He is not in acute distress. HENT:     Head: Normocephalic.  Eyes:     General:        Right eye: No discharge.        Left eye: No discharge.  Abdominal:     General: There is no distension.     Tenderness: There is no abdominal tenderness. There is no guarding.  Neurological:     Mental Status: He is alert.  Psychiatric:        Mood and Affect: Mood normal.     Filed Weights   06/04/20 0433 06/05/20 0500 06/06/20 0500  Weight: 52.5 kg 60.5 kg 58 kg     Intake/Output Summary (Last 24 hours) at 06/10/2020 0739 Last data filed at 06/10/2020 0200 Gross per 24 hour  Intake 1032.3 ml  Output 1900 ml  Net -867.7 ml   Net IO Since Admission: -8,152.45 mL [06/10/20 0739]  Pertinent Labs: CBC Latest Ref Rng & Units 06/10/2020 06/07/2020 06/06/2020  WBC 4.0 - 10.5 K/uL 5.9 8.4 9.4  Hemoglobin 13.0 - 17.0 g/dL 8.5(L) 9.6(L) 10.0(L)  Hematocrit 39.0 - 52.0 % 28.6(L) 32.2(L) 32.3(L)  Platelets 150 - 400 K/uL 412(H) 445(H) 462(H)    CMP Latest Ref Rng & Units 06/09/2020 06/08/2020 06/07/2020  Glucose 70 - 99 mg/dL 134(H) 125(H) 128(H)  BUN 8 - 23 mg/dL 10 10 9   Creatinine 0.61 - 1.24 mg/dL 0.41(L) 0.38(L) 0.45(L)  Sodium 135 - 145 mmol/L 136 134(L) 136  Potassium 3.5 - 5.1 mmol/L 4.0 3.8  3.7  Chloride 98 - 111 mmol/L 103 103 104  CO2 22 - 32 mmol/L 28 28 27   Calcium 8.9 - 10.3 mg/dL 7.5(L) 7.5(L) 7.6(L)  Total Protein 6.5 - 8.1 g/dL - - 4.6(L)  Total Bilirubin 0.3 - 1.2 mg/dL - - 1.2  Alkaline Phos 38 - 126 U/L - - 50  AST 15 - 41 U/L - - 13(L)  ALT 0 - 44 U/L - - 9    Imaging: No results found.  Assessment/Plan:   Principal Problem:   sigmoid colon cancer Active Problems:   Symptomatic anemia   Iron deficiency anemia due to chronic blood loss   Duodenal ulcer   Gastritis and gastroduodenitis   Gastroesophageal reflux disease with esophagitis without hemorrhage   Esophageal stricture   Protein-calorie malnutrition, severe   Aortic atherosclerosis (HCC)   Ileus following gastrointestinal surgery ([place])   Hyponatremia   Patient Summary: Patient is 65 yo gentlemanwith past medical history of questionable COPD,past left5,6,8ribs fracture who presented to the ED for dizziness, dyspnea with exertion,likely due to symptomatic anemia, found to have  duodenal ulcer and acolorectal masss/p resection. Currently managing post-op ileus.   Colorectal mass(T3B, N0, Mx) Post-op Ileus-improving  Good output from colostomy bag in the last 24 hour. Diet advancement per surgery.  Can d/c TPN when tolerate soft diet.  -Pain controlled with scheduled Tylenol and oxycodone PRN. -Noadjuvant chemotherapy per oncology.Follow-up with oncology in 1-16months   Non-bleeding duodenal ulcer Grade B esophagitis without bleeding, gastritis s/p bx, moderate esophageal stenosis s/p dilation Likely in the context of excessive NSAID use, heavy alcohol use and smoking - continue sucrafate - continue protonix 40mg  po BID x10w, then reduce to 40mg  daily - gastric pathologynegative for H. Pylori   Iron deficiencyanemia2/2 GI bleed. Hemoglobin dropped to 8.5 from 9.6, which could be secondary to hemodilution. Monitor for any source of bleeding  - Start po iron  supplement when he can tolerate p.o.   Alcoholuse disorder -Continue thiamine and multivitamin   New lung densitieswhich are questioned to be post inflammatory vs atelectasis.  -can repeatimaging outpatient to reevaluate   Diet: TPN, clear liquid MRA:JHHIDUP Code: Full Dispo:Pendingimprovement from postop ileus   Gaylan Gerold, DO 06/10/2020, 7:39 AM Pager: [phone]  Please contact the on call pager after 5 pm and on weekends at [phone].

## 2020-06-10 NOTE — Progress Notes (Signed)
Progress Note  11 Days Post-Op  Subjective: Patient tolerated FLD without nausea or vomiting. He wants to go home. Denies abdominal pain.   Objective: Vital signs in last 24 hours: Temp:  [98.1 F (36.7 C)-99.5 F (37.5 C)] 98.6 F (37 C) (04/17 0359) Pulse Rate:  [107-110] 107 (04/17 0359) Resp:  [17-18] 17 (04/17 0359) BP: (120-132)/(80-85) 132/84 (04/17 0359) SpO2:  [95 %-97 %] 97 % (04/17 0359) Last BM Date: 06/08/20  Intake/Output from previous day: 04/16 0701 - 04/17 0700 In: 1032.3 [I.V.:860.6; IV Piggyback:171.7] Out: 1900 [Urine:300; KWIOX:7353] Intake/Output this shift: No intake/output data recorded.  PE: Gen: Alert, NAD HEENT: EOM's intact, pupils equal and round Pulm: Normal rate and effort. GDJ:MEQASTMHD distended andsoft, no tenderness, +BS. Incisions with glue in place and c/d/i. Stoma pink, budded and viable. Colostomy bag recently changed Ext: No LE edema Psych: A&Ox3  Skin: no rashes noted, warm and dry   Lab Results:  Recent Labs    06/10/20 0330  WBC 5.9  HGB 8.5*  HCT 28.6*  PLT 412*   BMET Recent Labs    06/08/20 0410 06/09/20 1258  NA 134* 136  K 3.8 4.0  CL 103 103  CO2 28 28  GLUCOSE 125* 134*  BUN 10 10  CREATININE 0.38* 0.41*  CALCIUM 7.5* 7.5*   PT/INR No results for input(s): LABPROT, INR in the last 72 hours. CMP     Component Value Date/Time   NA 136 06/09/2020 1258   K 4.0 06/09/2020 1258   CL 103 06/09/2020 1258   CO2 28 06/09/2020 1258   GLUCOSE 134 (H) 06/09/2020 1258   BUN 10 06/09/2020 1258   CREATININE 0.41 (L) 06/09/2020 1258   CALCIUM 7.5 (L) 06/09/2020 1258   PROT 4.6 (L) 06/07/2020 0354   ALBUMIN 1.7 (L) 06/07/2020 0354   AST 13 (L) 06/07/2020 0354   ALT 9 06/07/2020 0354   ALKPHOS 50 06/07/2020 0354   BILITOT 1.2 06/07/2020 0354   GFRNONAA >60 06/09/2020 1258   GFRAA >60 09/09/2019 0207   Lipase  No results found for: LIPASE     Studies/Results: No results  found.  Anti-infectives: Anti-infectives (From admission, onward)   Start     Dose/Rate Route Frequency Ordered Stop   05/30/20 0600  cefoTEtan (CEFOTAN) 2 g in sodium chloride 0.9 % 100 mL IVPB        2 g 200 mL/hr over 30 Minutes Intravenous To Surgery 05/29/20 1430 05/30/20 1745   05/29/20 1400  neomycin (MYCIFRADIN) tablet 1,000 mg       "And" Linked Group Details   1,000 mg Oral 3 times per day 05/29/20 1152 05/29/20 2228   05/29/20 1400  metroNIDAZOLE (FLAGYL) tablet 1,000 mg       "And" Linked Group Details   1,000 mg Oral 3 times per day 05/29/20 1152 05/29/20 2228       Assessment/Plan COPD- PRN nebs Non-bleeding duodenal ulcer  Grade B esophogitis, gastritis, esophageal stenosis s/p dilation 4/2 Grade II internal hemorrhoids  Alcohol use disorder History of tobacco use ABL anemia- hgb stableat 9.6. Dr. Burr Medico recommendedstarting him on ferrous sulfate 325 mg twice dailywhen ileus resolves.  Protein calorie malnutrition- Pre-alb 8.0. On TPN.   Rectosigmoid colon cancer S/P robotic assisted LAR with end colostomy 05/30/20 Dr. Dema Severin POD#11 -Path=T3N0 adenocarcinoma.Seen byDr. Allena Napoleon arrange follow up. -CT scan 4/11 w/ ileus. No postoperative fluid collection or other complication noted. -advance to reg diet -discontinue TPN - Keep K > 4 and Mg >  2 for bowel function. - removed JP 4/16 -Appreciate WOC assistance with colostomy teaching.He has been set up for [facility] by CM. -Encouraged OOB, ambulation. PT recommending no follow up post op. OT rec HH. - Pulm toilet, IS  FEN:reg diet  VTE: SCDs,Lovenox ID: neomycin/flagyl 4/5, cefotetan 4/6. None currently. WBC8.4, afebrile. Foley: removed POD1. Voiding. Follow-Up- Dr. Dema Severin and Dr. Deatra James for discharge from a surgical standpoint when tolerating regular diet and having bowel function.   LOS: 16 days    Norm Parcel , [facility] 06/10/2020, 9:50 AM Please see  Amion for pager number during day hours 7:00am-4:30pm

## 2020-06-10 NOTE — Progress Notes (Signed)
PHARMACY - TOTAL PARENTERAL NUTRITION CONSULT NOTE   Indication: Prolonged ileus  Patient Measurements: Height: 5\' 11"  (180.3 cm) Weight: 58 kg (127 lb 13.9 oz) IBW/kg (Calculated) : 75.3 TPN AdjBW (KG): 56.8 Body mass index is 17.83 kg/m. 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 Patient tolerated 0-25% of CLD post op. Patient with a lot of N/V. On 4/12 patient reported continued abdominal distention, occasional nausea, hiccups and burping. Nurse reports 400 ml emesis overnight and no colostomy output. Surgery team ordered NGT and PICC on 4/12. Weights have been variable 47.7-61.6kg likely due to different scales used in hospital. Last standing weight was 54.9kg on 4/6 indicating about 5kg weight loss over 8 months. Very high risk for refeeding. Pharmacy consulted for TPN.  Glucose / Insulin: BG <150, A1C 5, used 4 units sensitive SSI/24hr Electrolytes: K 3.8 (Goal >/=4), Mg 2 (Goal >/=2), CoCa 9.3 others wnl Renal: Scr 0.38, BUN wnl Hepatic: LFTs wnl, albumin 1.7, prealbumin 8, TG 58  Intake / Output; MIVF: UOP 0.2 ml/kg/hr (?accurate). NGT removed 4/16. Drain removed 4/16. Colostomy output 1655ml. GI Imaging: 4/9 Abd XR: Moderate small bowel and gastric distention likely indicates postoperative ileus. 4/11 CT abd: fluid filled SB consistent w/ postoperative ileus 4/12 Abd XR: NGT placed GI Surgeries / Procedures:  4/6 Robotic assisted LAR with end colostomy  Central access: 4/12  TPN start date: 4/12    Nutritional Goals (per RD recommendation on 4/15): kCal: 2000-2200, Protein: 100-115g, Fluid: >/=2 L Goal TPN rate is 90 mL/hr (provides 101 g of protein and 2150 kcals per day)  Current Nutrition:  Regular diet started today - tolerated full liquids Ensure Enlive TID (each provides 350 kcal and 20 g protein) - refusing TPN  Plan:  Wean TPN - communicated with RN D/C  SSI   Thank you for involving pharmacy in this patient's care.  Renold Genta, PharmD, BCPS Clinical Pharmacist Clinical phone for 06/10/2020 until 3p is [phone] 06/10/2020 7:48 AM  **Pharmacist phone directory can be found on [email or web address] listed under [place]**

## 2020-06-11 ENCOUNTER — Inpatient Hospital Stay (HOSPITAL_COMMUNITY): Payer: Medicare Other

## 2020-06-11 LAB — BASIC METABOLIC PANEL
Anion gap: 10 (ref 5–15)
BUN: 9 mg/dL (ref 8–23)
CO2: 24 mmol/L (ref 22–32)
Calcium: 8.3 mg/dL — ABNORMAL LOW (ref 8.9–10.3)
Chloride: 100 mmol/L (ref 98–111)
Creatinine, Ser: 0.49 mg/dL — ABNORMAL LOW (ref 0.61–1.24)
GFR, Estimated: >60 mL/min (ref >60)
Glucose, Bld: 88 mg/dL (ref 70–99)
Potassium: 4.5 mmol/L (ref 3.5–5.1)
Sodium: 134 mmol/L — ABNORMAL LOW (ref 135–145)

## 2020-06-11 LAB — MAGNESIUM: Magnesium: 1.8 mg/dL (ref 1.7–2.4)

## 2020-06-11 LAB — CBC
HCT: 32.7 % — ABNORMAL LOW (ref 39.0–52.0)
Hemoglobin: 10.2 g/dL — ABNORMAL LOW (ref 13.0–17.0)
MCH: 27.9 pg (ref 26.0–34.0)
MCHC: 31.2 g/dL (ref 30.0–36.0)
MCV: 89.3 fL (ref 80.0–100.0)
Platelets: 360 10*3/uL (ref 150–400)
RBC: 3.66 MIL/uL — ABNORMAL LOW (ref 4.22–5.81)
RDW: 24.6 % — ABNORMAL HIGH (ref 11.5–15.5)
WBC: 7.8 10*3/uL (ref 4.0–10.5)
nRBC: 0 % (ref 0.0–0.2)

## 2020-06-11 IMAGING — CT CT ABD-PELV W/ CM
2 of 5 series · 17 of 46 positions shown, 19 images · IV contrast (omnipaque)
Comparison: [DATE]

CLINICAL DATA: Abdominal distension. Rectal cancer with left colon
resection left abdominal ostomy.

EXAM:
CT ABDOMEN AND PELVIS WITH CONTRAST
TECHNIQUE: Multidetector CT imaging of the abdomen and pelvis was performed
using the standard protocol following bolus administration of
intravenous contrast.
CONTRAST:  100mL OMNIPAQUE IOHEXOL 300 MG/ML  SOLN

[Series 3: a/p w/ 5mm · axial · 0.80mm/px · z∈[+876,+1301]mm · 14 of 97 slices shown, 16 images]
[im 6/97  soft-tissue]
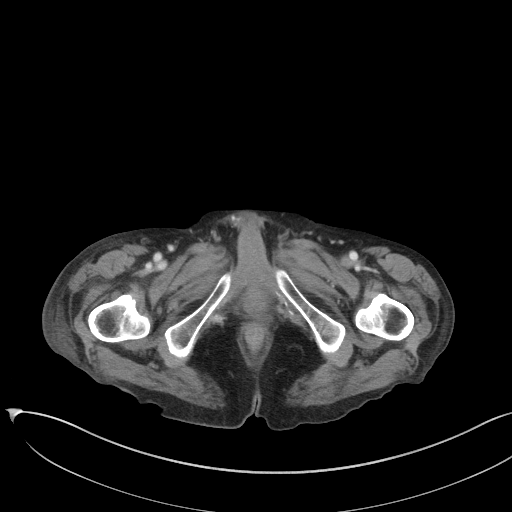
[im 6/97  bone]
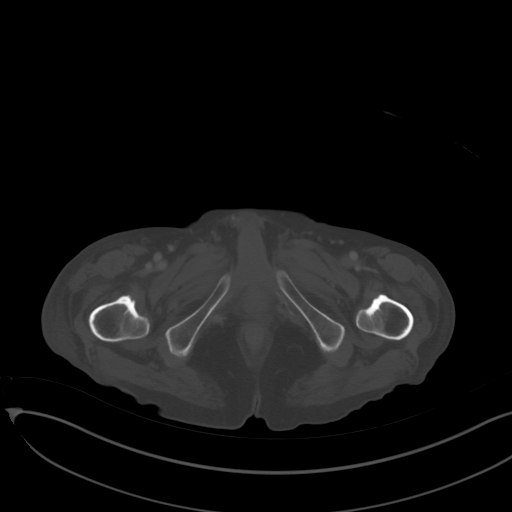
[im 11/97  soft-tissue]
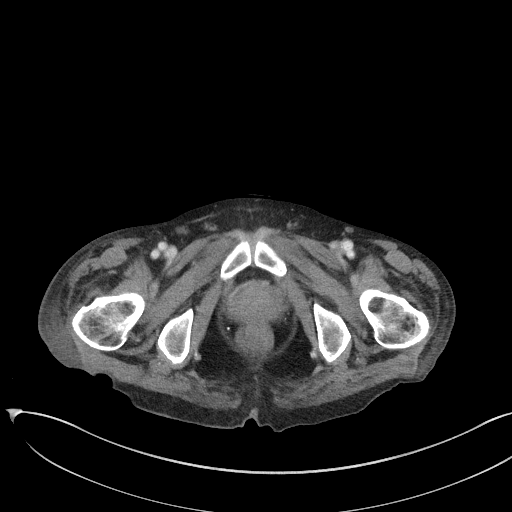
[im 21/97  soft-tissue]
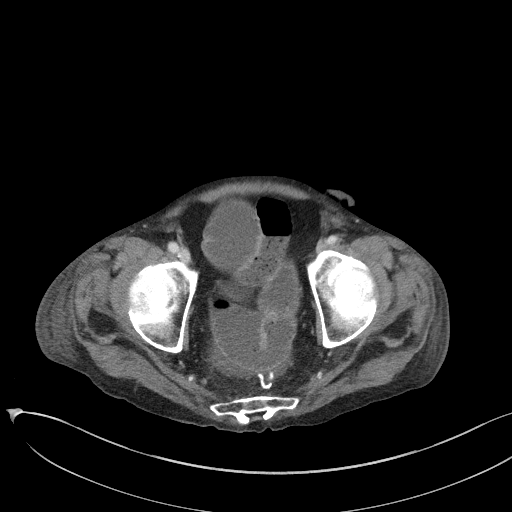
[im 26/97  soft-tissue]
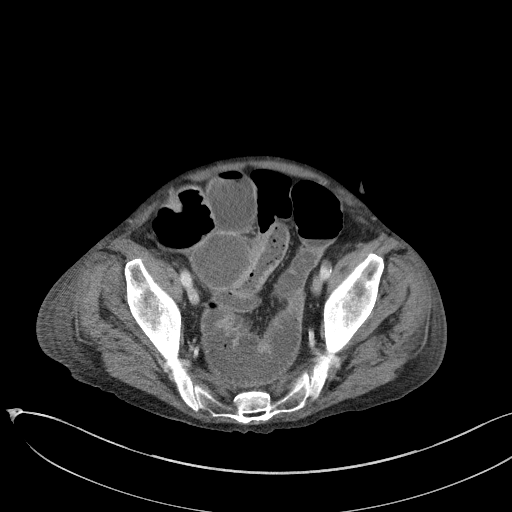
[im 31/97  soft-tissue]
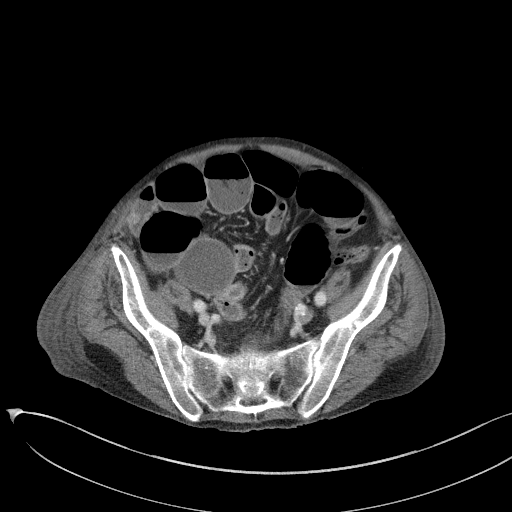
[im 41/97  soft-tissue]
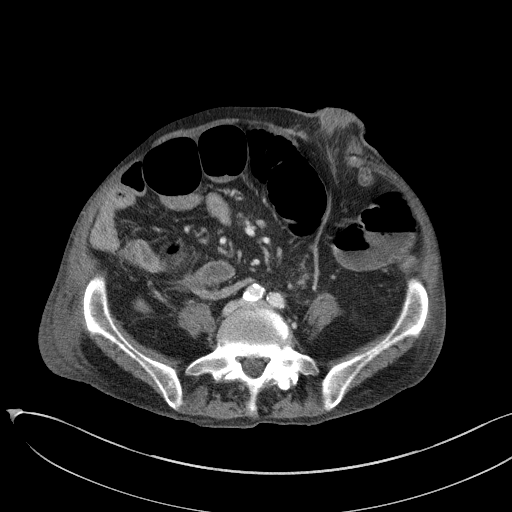
[im 46/97  soft-tissue]
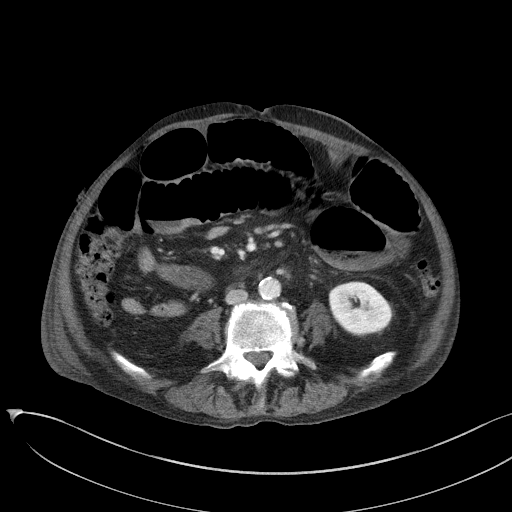
[im 51/97  soft-tissue]
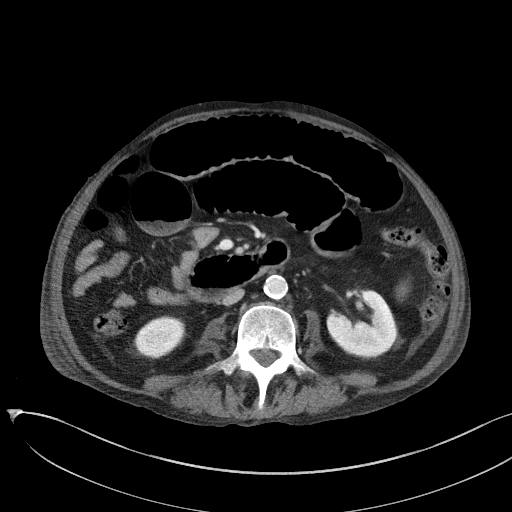
[im 56/97  soft-tissue]
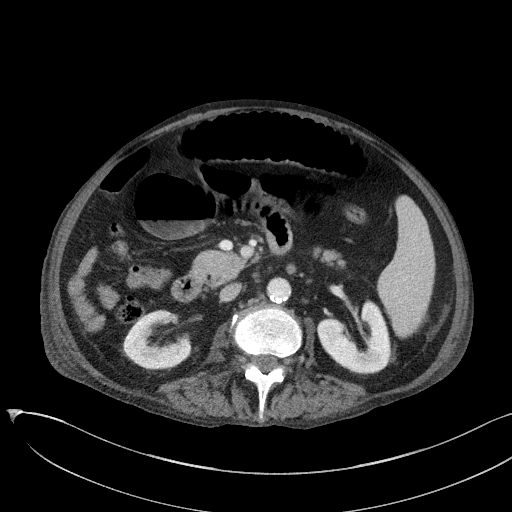
[im 56/97  bone]
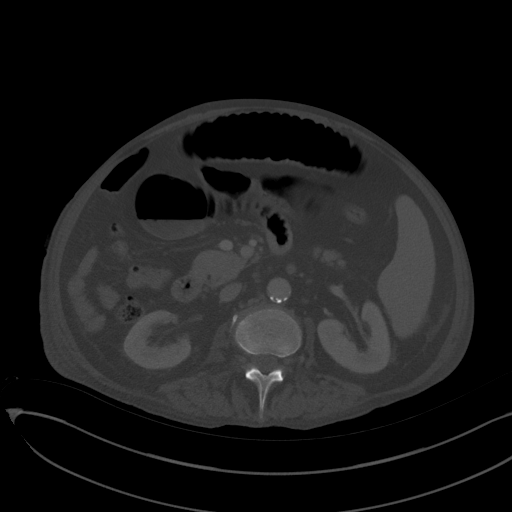
[im 66/97  soft-tissue]
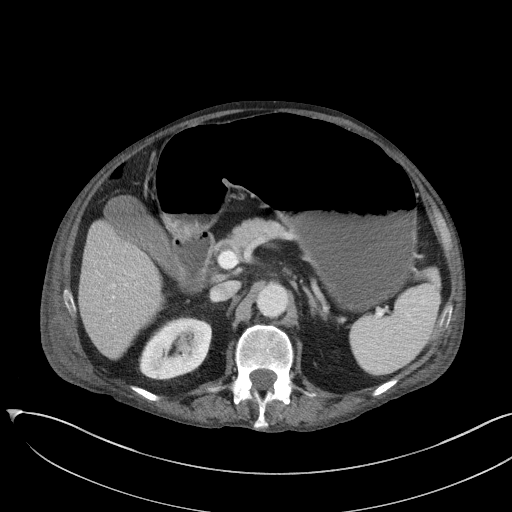
[im 71/97  soft-tissue]
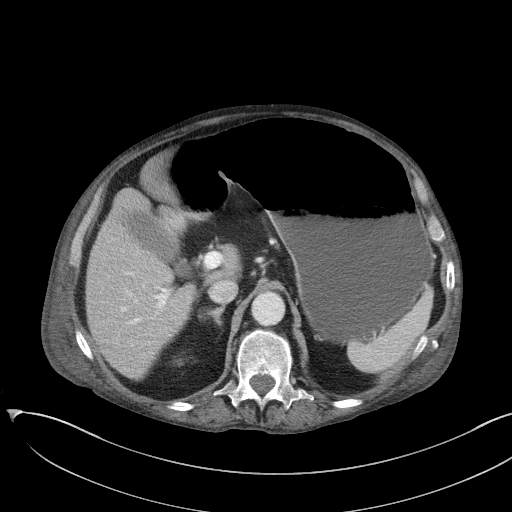
[im 76/97  soft-tissue]
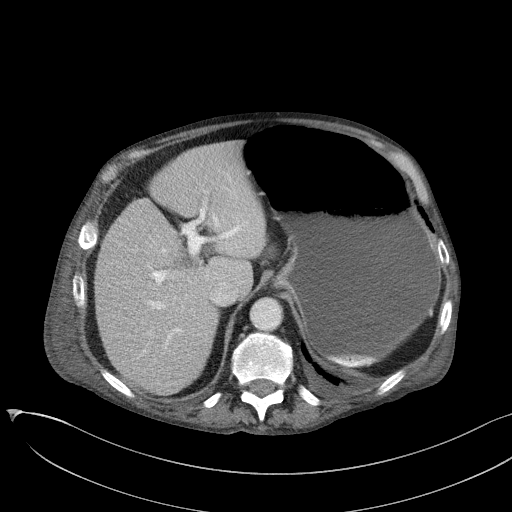
[im 86/97  soft-tissue]
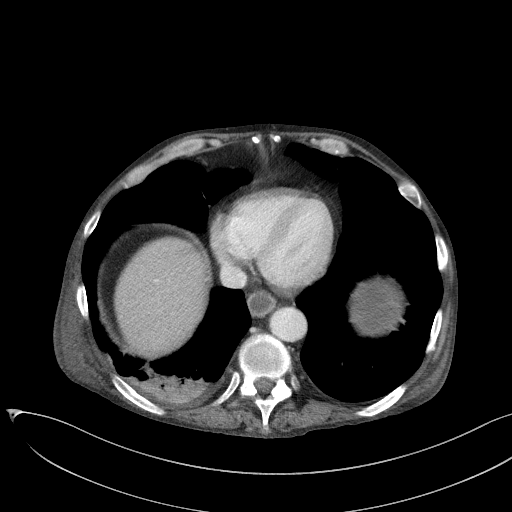
[im 91/97  soft-tissue]
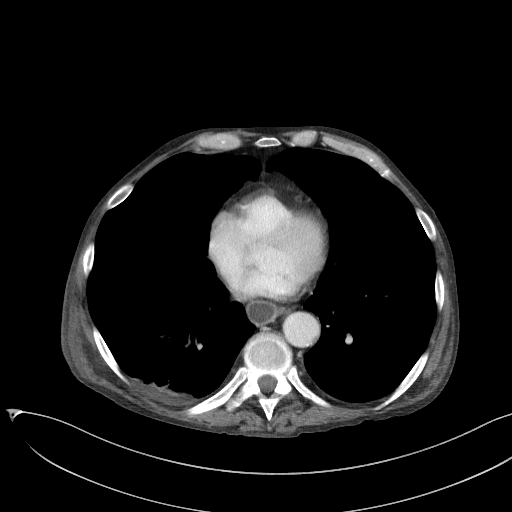

[Series 6: a/p w/ cor · coronal · 0.87mm/px · 3 of 131 slices shown]
[im 44/131  soft-tissue]
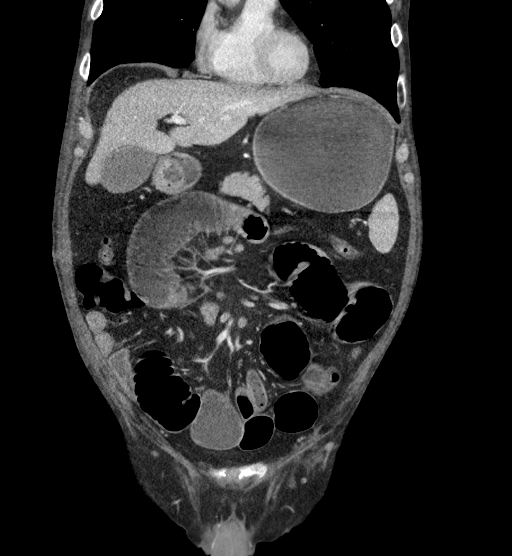
[im 58/131  soft-tissue]
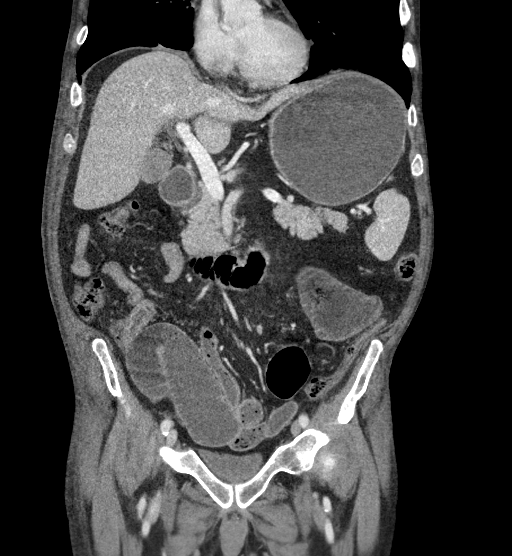
[im 73/131  soft-tissue]
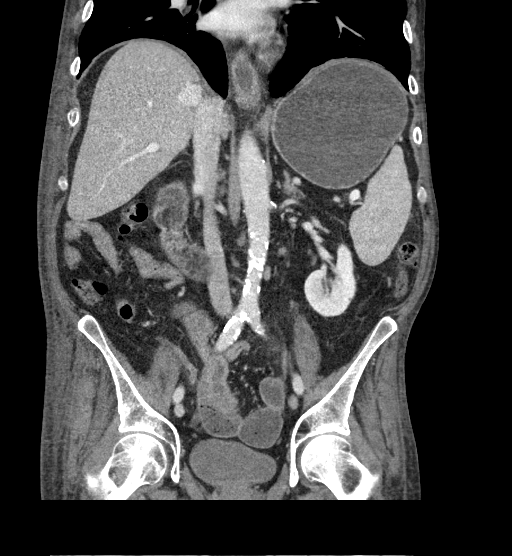

[17 of 46 positions shown; findings below may reference images not displayed]

FINDINGS: Lower chest: Atelectasis at the right lung base. Left lung base is
clear. Small amount of pleural fluid on the right. Fluid-filled
esophagus.

Hepatobiliary: Normal

Pancreas: Normal

Spleen: Normal

Adrenals/Urinary Tract: Adrenal glands are normal. Kidneys are
normal. Bladder is normal.

Stomach/Bowel: Stomach is distended and fluid-filled. The small
intestine is distended with gas and fluid to the level of the mid
ileum where there is a transition zone. The terminal ileum in the
colon are collapsed. This is indicative of small bowel obstruction.
Previously placed drain has been removed. No evidence of
intra-abdominal abscess. No complicating feature seen at the site of
the ostomy or the mucous fistula.

Vascular/Lymphatic: Aortic atherosclerosis. No aneurysm. IVC is
normal. No adenopathy.

Reproductive: Normal

Other: None

Musculoskeletal: Ordinary lower lumbar degenerative changes.
IMPRESSION: 1. Small-bowel obstruction with transition zone in the mid ileum.
Previously placed drain has been removed. No evidence of
intra-abdominal abscess.
2. Atelectasis at the right lung base. Small amount of pleural fluid
on the right.
3. Aortic atherosclerosis.

Aortic Atherosclerosis ([RG]-[RG]).

## 2020-06-11 MED ORDER — IOHEXOL 300 MG/ML  SOLN
100.0000 mL | Freq: Once | INTRAMUSCULAR | Status: AC | PRN
  Administered 2020-06-11: 100 mL via INTRAVENOUS

## 2020-06-11 MED ORDER — PANTOPRAZOLE SODIUM 40 MG IV SOLR
40.0000 mg | Freq: Two times a day (BID) | INTRAVENOUS | Status: DC
  Administered 2020-06-11 – 2020-06-19 (×17): 40 mg via INTRAVENOUS
  Filled 2020-06-11 (×17): qty 40

## 2020-06-11 MED ORDER — GUAIFENESIN 100 MG/5ML PO SOLN
5.0000 mL | Freq: Four times a day (QID) | ORAL | Status: DC | PRN
  Filled 2020-06-11: qty 5

## 2020-06-11 MED ORDER — HYDROMORPHONE HCL 1 MG/ML IJ SOLN
1.0000 mg | INTRAMUSCULAR | Status: DC | PRN
  Administered 2020-06-12 – 2020-06-16 (×15): 1 mg via INTRAVENOUS
  Filled 2020-06-11 (×15): qty 1

## 2020-06-11 NOTE — Progress Notes (Signed)
[facility] Progress Note  12 Days Post-Op  Subjective: CC-  Vomited multiple times this morning. Denies worsening abdominal pain or bloating. Ostomy with good output yesterday, states that his nurse also already emptied it this morning.  WBC 7.8, afebrile  Objective: Vital signs in last 24 hours: Temp:  [98.4 F (36.9 C)-99.6 F (37.6 C)] 98.4 F (36.9 C) (04/18 0651) Pulse Rate:  [101-109] 109 (04/18 0651) Resp:  [16-18] 18 (04/18 0651) BP: (124-141)/(82-89) 124/82 (04/18 0651) SpO2:  [95 %-98 %] 96 % (04/18 0651) Last BM Date: 06/08/20  Intake/Output from previous day: 04/17 0701 - 04/18 0700 In: 84 [IV Piggyback:50] Out: 1800 [Urine:500; Emesis/NG output:700; Stool:600] Intake/Output this shift: Total I/O In: -  Out: 275 [Emesis/NG output:200; Stool:75]  PE: Gen: Alert, NAD Pulm: Normal rate and effort. BSW:HQPR, mild distension, nontender, tinkling BS. Incisions with glue in place and c/d/i. Stoma pink, budded and viable. Colostomy bag recently emptied  Lab Results:  Recent Labs    06/10/20 0330 06/11/20 0139  WBC 5.9 7.8  HGB 8.5* 10.2*  HCT 28.6* 32.7*  PLT 412* 360   BMET Recent Labs    06/09/20 1258  NA 136  K 4.0  CL 103  CO2 28  GLUCOSE 134*  BUN 10  CREATININE 0.41*  CALCIUM 7.5*   PT/INR No results for input(s): LABPROT, INR in the last 72 hours. CMP     Component Value Date/Time   NA 136 06/09/2020 1258   K 4.0 06/09/2020 1258   CL 103 06/09/2020 1258   CO2 28 06/09/2020 1258   GLUCOSE 134 (H) 06/09/2020 1258   BUN 10 06/09/2020 1258   CREATININE 0.41 (L) 06/09/2020 1258   CALCIUM 7.5 (L) 06/09/2020 1258   PROT 4.6 (L) 06/07/2020 0354   ALBUMIN 1.7 (L) 06/07/2020 0354   AST 13 (L) 06/07/2020 0354   ALT 9 06/07/2020 0354   ALKPHOS 50 06/07/2020 0354   BILITOT 1.2 06/07/2020 0354   GFRNONAA >60 06/09/2020 1258   GFRAA >60 09/09/2019 0207   Lipase  No results found for: LIPASE     Studies/Results: No  results found.  Anti-infectives: Anti-infectives (From admission, onward)   Start     Dose/Rate Route Frequency Ordered Stop   05/30/20 0600  cefoTEtan (CEFOTAN) 2 g in sodium chloride 0.9 % 100 mL IVPB        2 g 200 mL/hr over 30 Minutes Intravenous To Surgery 05/29/20 1430 05/30/20 1745   05/29/20 1400  neomycin (MYCIFRADIN) tablet 1,000 mg       "And" Linked Group Details   1,000 mg Oral 3 times per day 05/29/20 1152 05/29/20 2228   05/29/20 1400  metroNIDAZOLE (FLAGYL) tablet 1,000 mg       "And" Linked Group Details   1,000 mg Oral 3 times per day 05/29/20 1152 05/29/20 2228       Assessment/Plan COPD- PRN nebs Non-bleeding duodenal ulcer  Grade B esophogitis, gastritis, esophageal stenosis s/p dilation 4/2 Grade II internal hemorrhoids  Alcohol use disorder History of tobacco use ABL anemia- hgb stableat 10.2. Dr. Burr Medico recommendedstarting him on ferrous sulfate 325 mg twice dailywhen ileus resolves.  Protein calorie malnutrition- Pre-alb 8.0 (4/11). Weaned TPN over the weekend and was advancing diet  Rectosigmoid colon cancer S/P robotic assisted LAR with end colostomy 05/30/20 Dr. Dema Severin - POD#12 -Path=T3N0 adenocarcinoma.Seen byDr. Allena Napoleon arrange follow up. -CT scan 4/11 w/ ileus. No postoperative fluid collection or other complication noted. - Keep K > 4 and  Mg > 2 for bowel function. -removed JP4/16 -Appreciate WOC assistance with colostomy teaching.He has been set up for [facility] by CM. -Encouraged OOB, ambulation. PT recommending no follow up post op. OT rec HH. - Pulm toilet, IS  FEN: FLD VTE: SCDs,Lovenox ID: neomycin/flagyl 4/5, cefotetan 4/6. None currently. WBC7.8, afebrile. Foley: removed POD1. Voiding. Follow-Up- Dr. Dema Severin and Dr. Burr Medico  Plan: Ileus seemed to be resolving over the weekend, TPN was stopped and diet advanced. Now with multiple episodes of emesis this morning. Refusing NG. Will obtain CT scan today. Check BMP and  magnesium. Needs to mobilize more.    LOS: 17 days    [facility] 06/11/2020, 10:46 AM Please see Amion for pager number during day hours 7:00am-4:30pm

## 2020-06-11 NOTE — Progress Notes (Signed)
   06/11/20 1623  Assess: MEWS Score  Temp 98.2 F (36.8 C)  BP 126/81  Pulse Rate (!) 118  Resp 18  SpO2 96 %  O2 Device Room Air  Assess: MEWS Score  MEWS Temp 0  MEWS Systolic 0  MEWS Pulse 2  MEWS RR 0  MEWS LOC 0  MEWS Score 2  MEWS Score Color Yellow  Assess: if the MEWS score is Yellow or Red  Were vital signs taken at a resting state? Yes  Focused Assessment No change from prior assessment  Early Detection of Sepsis Score *See Row Information* Low  MEWS guidelines implemented *See Row Information* Yes  Treat  MEWS Interventions Escalated (See documentation below)  Pain Scale 0-10  Pain Score 0  Take Vital Signs  Increase Vital Sign Frequency  Yellow: Q 2hr X 2 then Q 4hr X 2, if remains yellow, continue Q 4hrs  Escalate  MEWS: Escalate Yellow: discuss with charge nurse/RN and consider discussing with provider and RRT  Notify: Charge Nurse/RN  Name of Charge Nurse/RN Notified Racheal Patches RN  Date Charge Nurse/RN Notified 06/11/20  Time Charge Nurse/RN Notified 1630  Notify: Provider  Provider Name/Title [place] [facility]  Date Provider Notified 06/11/20  Time Provider Notified 1711  Notification Type Page  Notification Reason Change in status  Provider response See new orders  Date of Provider Response 06/11/20  Time of Provider Response 1715

## 2020-06-11 NOTE — Progress Notes (Signed)
Occupational Therapy Treatment Patient Details Name: Marc Mcdonald MRN: [id number] DOB: May 14, 1955 Today's Date: 06/11/2020    History of present illness Pt adm 4/1 with dizziness, weakness, and dyspnea. Pt with Hgb of 5.5 and found to have duodenal ulcer and rectal mass. Oncology and surgery involved and staging of rectal CA in process. Pt had surgery on 4/7 to place an End Ostomy. PMH - ?copd, lt rib fx's 08/2019, etoh   OT comments  Pt demonstrated decreased activity tolerance this session due to nausea and vomiting. Pt completed bed mobility and grooming while seated. Pt reports wanting to be able to go for a walk, but is limited due to weakness from vomiting and nausea episodes. Nursing notified of pt nausea/vomitting. OT will continue to follow up to work towards independence with all ADL's and functional mobility.    Follow Up Recommendations  Home health OT    Equipment Recommendations  Tub/shower seat    Recommendations for Other Services      Precautions / Restrictions Precautions Precautions: Fall;Other (comment) Precaution Comments: Pt has JP Drain R lower abdomen Restrictions Weight Bearing Restrictions: No       Mobility Bed Mobility Overal bed mobility: Needs Assistance Bed Mobility: Rolling;Sidelying to Sit Rolling: Supervision Sidelying to sit: Min assist       General bed mobility comments: Pt limited by nausea and weakness, requiring assistance to push up to sitting EOB.    Transfers                 General transfer comment: Pt unable to transfer at this time due to nausea and vomitting.    Balance Overall balance assessment: Needs assistance Sitting-balance support: No upper extremity supported;Feet supported Sitting balance-Leahy Scale: Good         Standing balance comment: Pt unable to stand this session due to nausea.                           ADL either performed or assessed with clinical judgement   ADL Overall ADL's  : Needs assistance/impaired Eating/Feeding: Independent;Sitting Eating/Feeding Details (indicate cue type and reason): Pt took meds and drank from a cup sitting EOB Grooming: Wash/dry hands;Wash/dry face;Set up;Sitting Grooming Details (indicate cue type and reason): Pt used a wash cloth and completed grooming EOB                               General ADL Comments: Pt unable to complete functional mobility at this time due to nausea and vomitting. Pt vomitted 2x during OT session. Nursing was in room to observe.     Vision       Perception     Praxis      Cognition Arousal/Alertness: Awake/alert Behavior During Therapy: WFL for tasks assessed/performed;Flat affect Overall Cognitive Status: Within Functional Limits for tasks assessed                                 General Comments: Pt very nauseated today and frustrated that he is not able to go home.        Exercises Exercises: General Lower Extremity General Exercises - Lower Extremity Ankle Circles/Pumps: AROM;Both;10 reps;Supine Short Arc Quad: AROM;Both;5 reps;Supine (bed in chair position) Heel Slides: AROM;Both;5 reps;Supine Hip ABduction/ADduction: AROM;Both;5 reps;Supine;AAROM (poor technique without AA) Hip Flexion/Marching: AROM;Both;5 reps;Supine   Shoulder Instructions  General Comments Nausea and Vomitting x2 during OT session. HR elevated to 110 supine in bed.    Pertinent Vitals/ Pain       Pain Assessment: No/denies pain Faces Pain Scale: Hurts a little bit Pain Location: back pain -"just nausea, no tightness or pain. my back is not comfortable but it's the bed." Pain Descriptors / Indicators: Grimacing;Discomfort Pain Intervention(s): Limited activity within patient's tolerance;Monitored during session;Repositioned;Other (comment) (RN aware)  Home Living                                          Prior Functioning/Environment               Frequency  Min 2X/week        Progress Toward Goals  OT Goals(current goals can now be found in the care plan section)  Progress towards OT goals: Progressing toward goals  Acute Rehab OT Goals Patient Stated Goal: go home OT Goal Formulation: With patient Time For Goal Achievement: 06/25/20 Potential to Achieve Goals: Good ADL Goals Pt Will Perform Grooming: Independently;standing Additional ADL Goal #1: Pt will perform UE and LE dressing sitting, with mod I, using AE as needed. Additional ADL Goal #2: Pt will report and demonstrate 3 fall prevention techniques.  Plan Frequency remains appropriate;Discharge plan remains appropriate    Co-evaluation                 AM-PAC OT "6 Clicks" Daily Activity     Outcome Measure   Help from another person eating meals?: None Help from another person taking care of personal grooming?: A Little Help from another person toileting, which includes using toliet, bedpan, or urinal?: A Little Help from another person bathing (including washing, rinsing, drying)?: A Little Help from another person to put on and taking off regular upper body clothing?: None Help from another person to put on and taking off regular lower body clothing?: A Lot 6 Click Score: 19    End of Session    OT Visit Diagnosis: Unsteadiness on feet (R26.81);Muscle weakness (generalized) (M62.81)   Activity Tolerance Other (comment) (Pt limited by nausea/vomitting)   Patient Left in bed;with call bell/phone within reach   Nurse Communication Mobility status;Other (comment) (pt nausea/vomitting)        Time: 4259-5638 OT Time Calculation (min): 18 min  Charges: OT General Charges $OT Visit: 1 Visit OT Treatments $Self Care/Home Management : 8-22 mins  Aydeen Blume H., OTR/L Acute Rehabilitation  Gurshan Settlemire Elane Daden Mahany 06/11/2020, 2:44 PM

## 2020-06-11 NOTE — Progress Notes (Signed)
   06/11/20 1832  Assess: MEWS Score  Temp 98.4 F (36.9 C)  BP 120/85  Pulse Rate (!) 121  Resp 20  SpO2 96 %  O2 Device Room Air  Assess: MEWS Score  MEWS Temp 0  MEWS Systolic 0  MEWS Pulse 2  MEWS RR 0  MEWS LOC 0  MEWS Score 2  MEWS Score Color Yellow  Document  Patient Outcome Other (Comment) (started fluids)  Progress note created (see row info) Yes

## 2020-06-11 NOTE — Progress Notes (Addendum)
Physical Therapy Treatment Patient Details Name: Marc Mcdonald MRN: [id number] DOB: 1956-01-16 Today's Date: 06/11/2020    History of Present Illness Pt adm 4/1 with dizziness, weakness, and dyspnea. Pt with Hgb of 5.5 and found to have duodenal ulcer and rectal mass. Oncology and surgery involved and staging of rectal CA in process. Pt had surgery on 4/7 to place an End Ostomy. PMH - ?copd, lt rib fx's 08/2019, etoh    PT Comments    Pt received in supine, pt deferred OOB mobility due to recent episode of nausea/vomiting but agreeable to limited supine exercises. Pt given HEP handout (link: HEP: [email or web address] Access Code: [email or web address]) to reinforce and encouraged him to perform 2-3x/day as able. Pt also instructed on benefits of mobility, use of SCDs when in hospital bed for prolonged periods and given pressure offloading information. Pt refusing SCDs at this time. Per RN, plan for abdominal CT scan today. Pt continues to benefit from PT services to progress toward functional mobility goals. Discharge recommendations updated to include HHPT per discussion with supervising PT Katie F. as he is more deconditioned than on admission and is needing help to perform transfers from chair.   Follow Up Recommendations  Home health PT;Supervision for mobility/OOB     Equipment Recommendations  Rolling walker with 5" wheels    Recommendations for Other Services       Precautions / Restrictions Precautions Precautions: Fall;Other (comment) Precaution Comments: Pt has JP Drain R lower abdomen Restrictions Weight Bearing Restrictions: No    Mobility  Bed Mobility Overal bed mobility: Needs Assistance Bed Mobility: Rolling Rolling: Supervision         General bed mobility comments: Pt limited by pain, required 1-step cueing for hand placement for posterior supine scooting toward HOB and confused regarding sequencing/purpose despite pt being informed previously that he needs to scoot  up higher in the bed    Transfers        General transfer comment: pt refusing despite encouragement          Cognition Arousal/Alertness: Awake/alert Behavior During Therapy: WFL for tasks assessed/performed;Flat affect Overall Cognitive Status: Within Functional Limits for tasks assessed        General Comments: Pt frustrated by symptoms of nausea/vomiting today (says "It's vomiting not that" when asked if he has nausea, some slow processing and decreased insight); fair to poor following of 1-step cues but may be partly due to decreased motivation to participate      Exercises General Exercises - Lower Extremity Ankle Circles/Pumps: AROM;Both;10 reps;Supine Short Arc Quad: AROM;Both;5 reps;Supine (bed in chair position) Heel Slides: AROM;Both;5 reps;Supine Hip ABduction/ADduction: AROM;Both;5 reps;Supine;AAROM (poor technique without AA) Hip Flexion/Marching: AROM;Both;5 reps;Supine    General Comments General comments (skin integrity, edema, etc.): HR elevated 110 bpm resting and c/o recent episodes of vomiting/needing to spit into blue bag but no episode vomiting during session      Pertinent Vitals/Pain Pain Assessment: No/denies pain Faces Pain Scale: Hurts a little bit Pain Location: back pain -"just nausea, no tightness or pain. my back is not comfortable but it's the bed." Pain Descriptors / Indicators: Grimacing;Discomfort Pain Intervention(s): Limited activity within patient's tolerance;Monitored during session;Repositioned;Other (comment) (RN aware)    Home Living                      Prior Function            PT Goals (current goals can now be found in the care plan section) Acute  Rehab PT Goals Patient Stated Goal: go home PT Goal Formulation: With patient Time For Goal Achievement: 06/11/20 Potential to Achieve Goals: Fair Progress towards PT goals: Progressing toward goals (slow progress, neausea symptoms limiting today)     Frequency    Min 3X/week      PT Plan Discharge plan needs to be updated    Co-evaluation              AM-PAC PT "6 Clicks" Mobility   Outcome Measure  Help needed turning from your back to your side while in a flat bed without using bedrails?: A Little Help needed moving from lying on your back to sitting on the side of a flat bed without using bedrails?: A Little Help needed moving to and from a bed to a chair (including a wheelchair)?: A Little Help needed standing up from a chair using your arms (e.g., wheelchair or bedside chair)?: A Little Help needed to walk in hospital room?: A Little Help needed climbing 3-5 steps with a railing? : A Lot 6 Click Score: 17    End of Session   Activity Tolerance: Other (comment) (self-limiting due to recent episodes of vomiting) Patient left: with call bell/phone within reach;in bed;with bed alarm set Nurse Communication: Mobility status;Other (comment) (RN notified he refuses OOB mobility) PT Visit Diagnosis: Unsteadiness on feet (R26.81);Muscle weakness (generalized) (M62.81);History of falling (Z91.81)     Time: 1610-9604 PT Time Calculation (min) (ACUTE ONLY): 10 min  Charges:  $Therapeutic Exercise: 8-22 mins                     [name] P., PTA Acute Rehabilitation Services Pager: [phone] Office: [phone]   [place] 06/11/2020, 1:43 PM

## 2020-06-11 NOTE — Progress Notes (Signed)
  Date: 06/11/2020  Patient name: Marc Mcdonald  Medical record number: [id number]  Date of birth: 10-14-1955        I have seen and evaluated this patient and I have discussed the plan of care with the house staff. Please see Dr. Lamont Snowball note for complete details. I concur with his findings with the following additions/corrections: Would plan to change pain medications and nausea medications to IV routes given he is NPO and CT reviewed and showed SBO.   If vomiting or increased pain/distention overnight, would replace NGT.   NPO and bowel rest.   Sid Falcon, MD 06/11/2020, 6:39 PM

## 2020-06-11 NOTE — Progress Notes (Signed)
Patient with sudden nausea with vomiting this morning. Patient stated that he felt better after the first episode. Second episode of vomiting happened soon after. Patient stated that does not feel ready to go home today.

## 2020-06-11 NOTE — Progress Notes (Signed)
HD#17 Subjective:   Patient had 2 episodes of emesis this morning.  Patient lying in bed, appears comfortable.  Patient states that he started solid food with dinner last night and and tolerated that okay.  Report 2 episode emesis at 3 AM this morning.   Objective:  Vital signs in last 24 hours: Vitals:   06/10/20 0359 06/10/20 1434 06/10/20 2111 06/11/20 0651  BP: 132/84 (!) 141/89 127/86 124/82  Pulse: (!) 107 (!) 101 (!) 102 (!) 109  Resp: 17 16 18 18   Temp: 98.6 F (37 C) 98.4 F (36.9 C) 99.6 F (37.6 C) 98.4 F (36.9 C)  TempSrc: Oral  Oral Oral  SpO2: 97% 98% 95% 96%  Weight:      Height:       Supplemental O2: Room Air SpO2: 96 % O2 Flow Rate (L/min): 6 L/min   Physical Exam:  Physical Exam Constitutional:      General: He is not in acute distress. Eyes:     General:        Right eye: No discharge.        Left eye: No discharge.  Abdominal:     General: Bowel sounds are normal. There is distension (Mild distention).     Tenderness: There is no abdominal tenderness. There is no guarding.  Skin:    General: Skin is warm.  Neurological:     Mental Status: He is alert.  Psychiatric:        Mood and Affect: Mood normal.     Filed Weights   06/04/20 0433 06/05/20 0500 06/06/20 0500  Weight: 52.5 kg 60.5 kg 58 kg     Intake/Output Summary (Last 24 hours) at 06/11/2020 0728 Last data filed at 06/11/2020 0701 Gross per 24 hour  Intake 50 ml  Output 1875 ml  Net -1825 ml   Net IO Since Admission: -9,977.45 mL [06/11/20 0728]  Pertinent Labs: CBC Latest Ref Rng & Units 06/11/2020 06/10/2020 06/07/2020  WBC 4.0 - 10.5 K/uL 7.8 5.9 8.4  Hemoglobin 13.0 - 17.0 g/dL 10.2(L) 8.5(L) 9.6(L)  Hematocrit 39.0 - 52.0 % 32.7(L) 28.6(L) 32.2(L)  Platelets 150 - 400 K/uL 360 412(H) 445(H)    CMP Latest Ref Rng & Units 06/09/2020 06/08/2020 06/07/2020  Glucose 70 - 99 mg/dL 134(H) 125(H) 128(H)  BUN 8 - 23 mg/dL 10 10 9   Creatinine 0.61 - 1.24 mg/dL 0.41(L)  0.38(L) 0.45(L)  Sodium 135 - 145 mmol/L 136 134(L) 136  Potassium 3.5 - 5.1 mmol/L 4.0 3.8 3.7  Chloride 98 - 111 mmol/L 103 103 104  CO2 22 - 32 mmol/L 28 28 27   Calcium 8.9 - 10.3 mg/dL 7.5(L) 7.5(L) 7.6(L)  Total Protein 6.5 - 8.1 g/dL - - 4.6(L)  Total Bilirubin 0.3 - 1.2 mg/dL - - 1.2  Alkaline Phos 38 - 126 U/L - - 50  AST 15 - 41 U/L - - 13(L)  ALT 0 - 44 U/L - - 9    Imaging: No results found.  Assessment/Plan:   Principal Problem:   sigmoid colon cancer Active Problems:   Symptomatic anemia   Iron deficiency anemia due to chronic blood loss   Duodenal ulcer   Gastritis and gastroduodenitis   Gastroesophageal reflux disease with esophagitis without hemorrhage   Esophageal stricture   Protein-calorie malnutrition, severe   Aortic atherosclerosis (HCC)   Ileus following gastrointestinal surgery ([place])   Hyponatremia   Patient Summary: Patient is 65 yo gentlemanwith past medical history of questionable COPD,past left5,6,8ribs  fracture who presented to the ED for dizziness, dyspnea with exertion,likely due to symptomatic anemia, found to have duodenal ulcer and acolorectal masss/p resection. Currently managing post-op ileus.   Colorectal mass(T3B, N0, Mx) Post-op Ileus Plan was to discharge today.  However he had 2 episodes of emesis this morning, likely from advancing to solid food yesterday.  Surgery has seen the patient and recommended n.p.o. and obtain CT abdomen. - Follow-up on CT abdomen -Pain controlled with scheduled Tylenol and oxycodone PRN.  Only require 1 does of Oxy 5 mg overnight -Noadjuvant chemotherapy per oncology.Follow-up with oncology in 1-2months  Non-bleeding duodenal ulcer Grade B esophagitis without bleeding, gastritis s/p bx, moderate esophageal stenosis s/p dilation Likely in the context of excessive NSAID use, heavy alcohol use and smoking - continue sucrafate - continue protonix 40mg  po BID x10w, then reduce to 40mg   daily - gastric pathologynegative for H. Pylori  Iron deficiencyanemia2/2 GI bleed. Hgb stable at 10. - Start po iron supplement when he can tolerate p.o.  Alcoholuse disorder -Continue thiamine and multivitamin  New lung densitieswhich are questioned to be post inflammatory vs atelectasis.  -can repeatimaging outpatient to reevaluate  Diet: NPO YGE:FUWTKTC Code: Full Dispo:Pendingimprovement from postop ileus. Discharge to home   Gaylan Gerold, DO 06/11/2020, 7:28 AM Pager: [phone]  Please contact the on call pager after 5 pm and on weekends at [phone].

## 2020-06-11 NOTE — Progress Notes (Signed)
Significant event note:  CT abdomen and pelvis demonstrates new small bowel obstruction with transition point to mid ileum.  Contacted general surgery and spoke with Dr. Dema Severin who recommends placement of NG tube and IV fluids.  Went to assess patient at bedside.  He was resting comfortably and denied any nausea.  We discussed that at this time, the best action would be to replace the NG tube to allow for bowel rest and decompression.  He expressed understanding but is not willing to have it placed at this time.  He would like to think about it and talk more tomorrow morning. I informed him that if he develops vomiting or becomes clinically unstable, the NG tube will become even more necessary.   -Order for NG tube placed in case patient changes his mind -Increased IV fluids to 125 cc/hr -Stat KUB recommended for any acute change given risk for perforation  Dr. Jose Persia Internal Medicine PGY-2  Pager: [phone] After 5pm on weekdays and 1pm on weekends: On Call pager [phone]  06/11/2020, 7:44 PM

## 2020-06-12 ENCOUNTER — Inpatient Hospital Stay: Payer: Self-pay

## 2020-06-12 ENCOUNTER — Inpatient Hospital Stay (HOSPITAL_COMMUNITY): Payer: Medicare Other

## 2020-06-12 DIAGNOSIS — E43 Unspecified severe protein-calorie malnutrition: Secondary | ICD-10-CM

## 2020-06-12 LAB — CBC
HCT: 32 % — ABNORMAL LOW (ref 39.0–52.0)
Hemoglobin: 9.7 g/dL — ABNORMAL LOW (ref 13.0–17.0)
MCH: 27.1 pg (ref 26.0–34.0)
MCHC: 30.3 g/dL (ref 30.0–36.0)
MCV: 89.4 fL (ref 80.0–100.0)
Platelets: 436 10*3/uL — ABNORMAL HIGH (ref 150–400)
RBC: 3.58 MIL/uL — ABNORMAL LOW (ref 4.22–5.81)
RDW: 23.9 % — ABNORMAL HIGH (ref 11.5–15.5)
WBC: 7.8 10*3/uL (ref 4.0–10.5)
nRBC: 0 % (ref 0.0–0.2)

## 2020-06-12 LAB — BASIC METABOLIC PANEL
Anion gap: 8 (ref 5–15)
BUN: 11 mg/dL (ref 8–23)
CO2: 23 mmol/L (ref 22–32)
Calcium: 7.9 mg/dL — ABNORMAL LOW (ref 8.9–10.3)
Chloride: 102 mmol/L (ref 98–111)
Creatinine, Ser: 0.51 mg/dL — ABNORMAL LOW (ref 0.61–1.24)
GFR, Estimated: >60 mL/min (ref >60)
Glucose, Bld: 82 mg/dL (ref 70–99)
Potassium: 4.6 mmol/L (ref 3.5–5.1)
Sodium: 133 mmol/L — ABNORMAL LOW (ref 135–145)

## 2020-06-12 LAB — GLUCOSE, CAPILLARY
Glucose-Capillary: 165 mg/dL — ABNORMAL HIGH (ref 70–99)
Glucose-Capillary: 170 mg/dL — ABNORMAL HIGH (ref 70–99)

## 2020-06-12 IMAGING — DX DG ABDOMEN 1V
1 series · 1 of 1 positions shown · non-contrast
Comparison: [DATE]

CLINICAL DATA: NG tube placement

EXAM:
ABDOMEN - 1 VIEW

[abdomen supine]
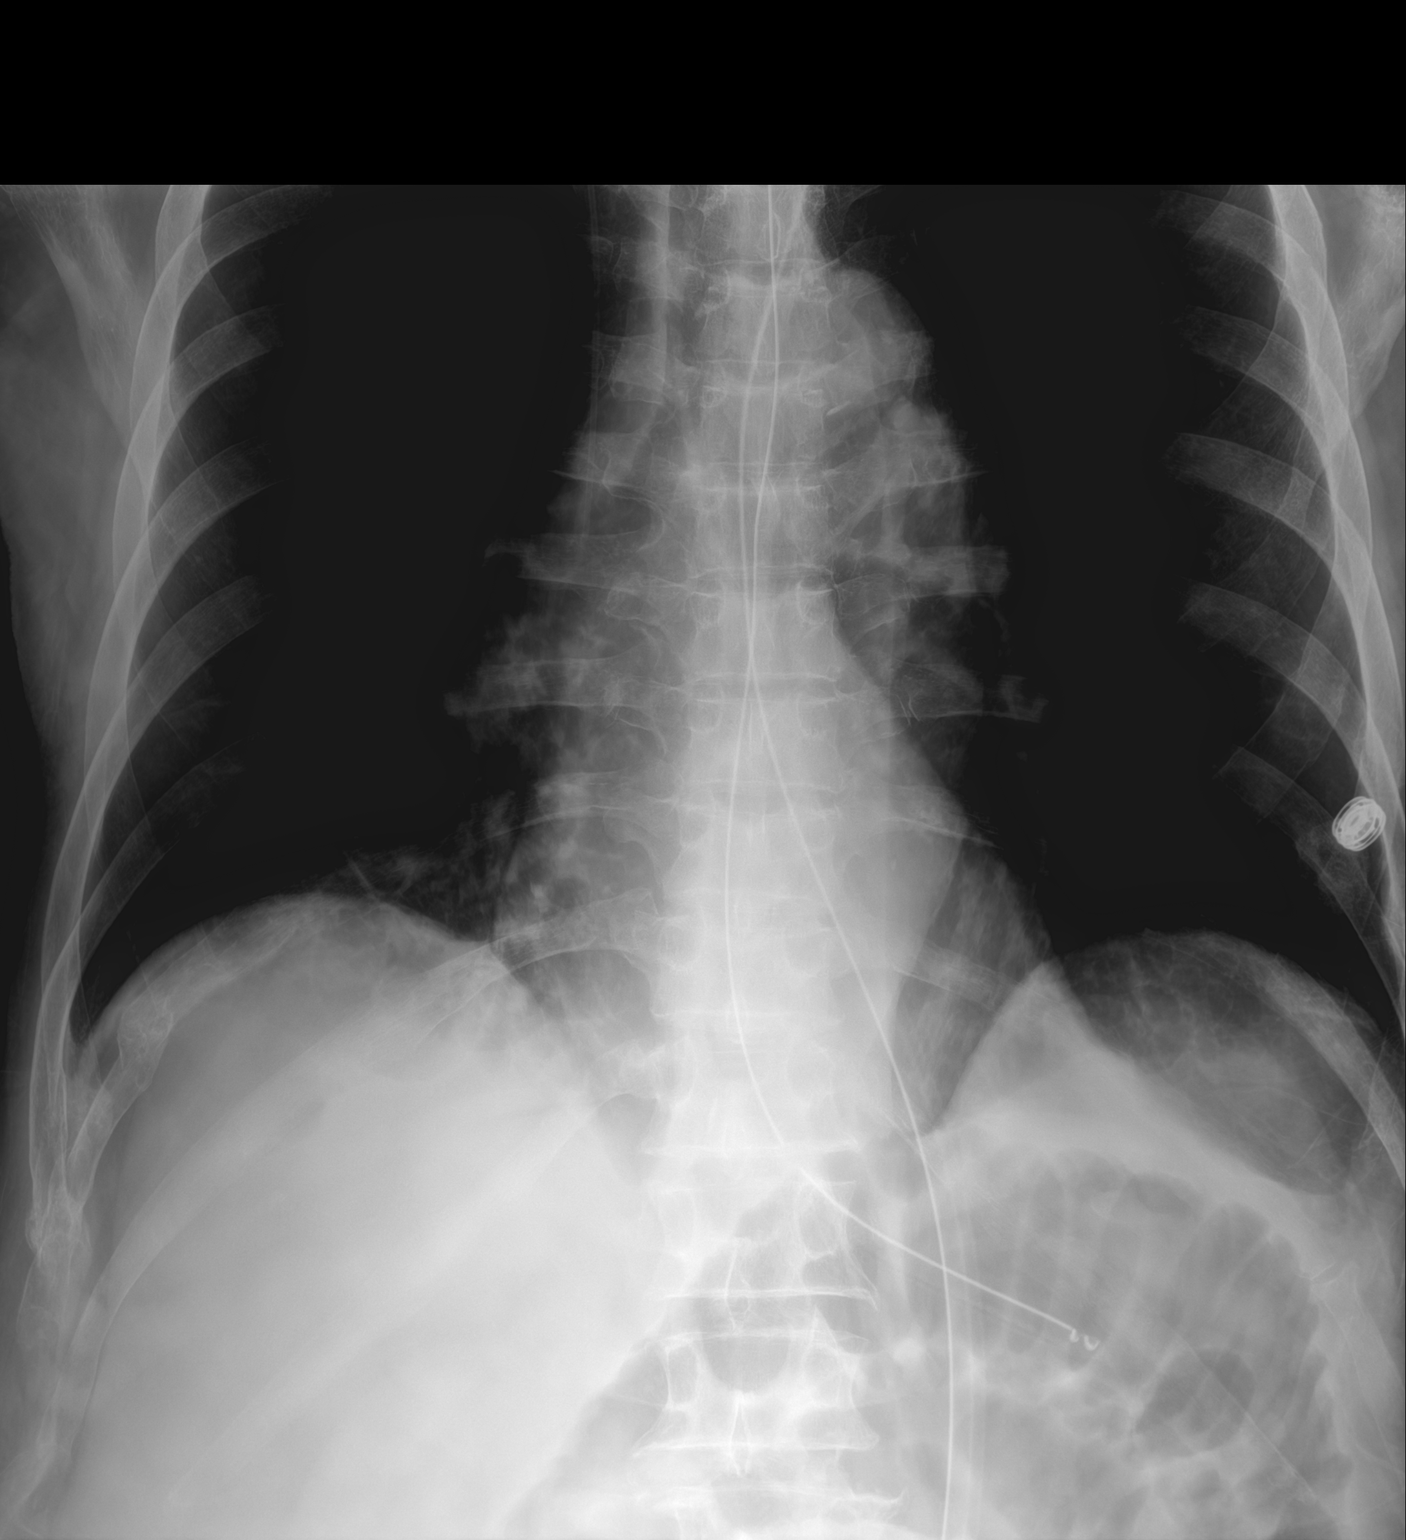

[1 of 1 positions shown; findings below may reference images not displayed]

FINDINGS: NG tube tip is in the proximal stomach. Proximal side port of the NG
tube is above the GE junction. Tube could be advanced approximately
5-6 cm to place the proximal side port below the GE junction. Right
central line tip overlies the mid SVC level. Gaseous small bowel
distension noted upper abdomen.
IMPRESSION: NG tube tip is in the proximal stomach with proximal side port above
the GE junction. See above.

## 2020-06-12 MED ORDER — TRAVASOL 10 % IV SOLN
INTRAVENOUS | Status: AC
  Filled 2020-06-12: qty 1015.2

## 2020-06-12 MED ORDER — SODIUM CHLORIDE 0.9% FLUSH: 10.0000 mL | INTRAVENOUS | Status: DC | PRN

## 2020-06-12 MED ORDER — CHLORHEXIDINE GLUCONATE CLOTH 2 % EX PADS
6.0000 | MEDICATED_PAD | Freq: Every day | CUTANEOUS | Status: DC
  Administered 2020-06-12 – 2020-06-20 (×7): 6 via TOPICAL

## 2020-06-12 MED ORDER — METHOCARBAMOL 500 MG PO TABS
500.0000 mg | ORAL_TABLET | Freq: Three times a day (TID) | ORAL | Status: DC | PRN
  Administered 2020-06-12: 500 mg via ORAL
  Filled 2020-06-12 (×2): qty 1

## 2020-06-12 NOTE — Progress Notes (Signed)
[facility] Progress Note  13 Days Post-Op  Subjective: CC-  Son and friend at bedside. CT yesterday showed possible SBO with transition in the mid ileum, no evidence of intraabdominal abscess. Multiple episodes of n/v yesterday. States that he has not vomited today. He has refused NG tube multiple times and refuses again this morning. Ostomy with some output.  Objective: Vital signs in last 24 hours: Temp:  [98.1 F (36.7 C)-98.4 F (36.9 C)] 98.1 F (36.7 C) (04/19 0428) Pulse Rate:  [108-121] 108 (04/19 0428) Resp:  [18-20] 18 (04/19 0428) BP: (108-126)/(79-85) 124/79 (04/19 0428) SpO2:  [96 %] 96 % (04/19 0428) Weight:  [59.6 kg] 59.6 kg (04/19 0428) Last BM Date: 06/11/20  Intake/Output from previous day: 04/18 0701 - 04/19 0700 In: 1354.2 [I.V.:1354.2] Out: 1725 [Emesis/NG output:1600; Stool:125] Intake/Output this shift: No intake/output data recorded.  PE: Gen: Alert, NAD Pulm: Normal rate and effort. KCM:KLKJ, mild distension,nontender, tinkling BS. Incisions with glue in place and c/d/i. Stoma pink, budded and viable. Colostomy bagwith loose brown stool in pouch  Lab Results:  Recent Labs    06/11/20 0139 06/12/20 0244  WBC 7.8 7.8  HGB 10.2* 9.7*  HCT 32.7* 32.0*  PLT 360 436*   BMET Recent Labs    06/09/20 1258 06/11/20 1050  NA 136 134*  K 4.0 4.5  CL 103 100  CO2 28 24  GLUCOSE 134* 88  BUN 10 9  CREATININE 0.41* 0.49*  CALCIUM 7.5* 8.3*   PT/INR No results for input(s): LABPROT, INR in the last 72 hours. CMP     Component Value Date/Time   NA 134 (L) 06/11/2020 1050   K 4.5 06/11/2020 1050   CL 100 06/11/2020 1050   CO2 24 06/11/2020 1050   GLUCOSE 88 06/11/2020 1050   BUN 9 06/11/2020 1050   CREATININE 0.49 (L) 06/11/2020 1050   CALCIUM 8.3 (L) 06/11/2020 1050   PROT 4.6 (L) 06/07/2020 0354   ALBUMIN 1.7 (L) 06/07/2020 0354   AST 13 (L) 06/07/2020 0354   ALT 9 06/07/2020 0354   ALKPHOS 50 06/07/2020 0354    BILITOT 1.2 06/07/2020 0354   GFRNONAA >60 06/11/2020 1050   GFRAA >60 09/09/2019 0207   Lipase  No results found for: LIPASE     Studies/Results: CT ABDOMEN PELVIS W CONTRAST  Result Date: 06/11/2020 CLINICAL DATA:  Abdominal distension. Rectal cancer with left colon resection left abdominal ostomy. EXAM: CT ABDOMEN AND PELVIS WITH CONTRAST TECHNIQUE: Multidetector CT imaging of the abdomen and pelvis was performed using the standard protocol following bolus administration of intravenous contrast. CONTRAST:  110mL OMNIPAQUE IOHEXOL 300 MG/ML  SOLN COMPARISON:  06/04/2020 FINDINGS: Lower chest: Atelectasis at the right lung base. Left lung base is clear. Small amount of pleural fluid on the right. Fluid-filled esophagus. Hepatobiliary: Normal Pancreas: Normal Spleen: Normal Adrenals/Urinary Tract: Adrenal glands are normal. Kidneys are normal. Bladder is normal. Stomach/Bowel: Stomach is distended and fluid-filled. The small intestine is distended with gas and fluid to the level of the mid ileum where there is a transition zone. The terminal ileum in the colon are collapsed. This is indicative of small bowel obstruction. Previously placed drain has been removed. No evidence of intra-abdominal abscess. No complicating feature seen at the site of the ostomy or the mucous fistula. Vascular/Lymphatic: Aortic atherosclerosis. No aneurysm. IVC is normal. No adenopathy. Reproductive: Normal Other: None Musculoskeletal: Ordinary lower lumbar degenerative changes. IMPRESSION: 1. Small-bowel obstruction with transition zone in the mid ileum. Previously placed  drain has been removed. No evidence of intra-abdominal abscess. 2. Atelectasis at the right lung base. Small amount of pleural fluid on the right. 3. Aortic atherosclerosis. Aortic Atherosclerosis (ICD10-I70.0). Electronically Signed   By: Nelson Chimes M.D.   On: 06/11/2020 16:45   [place] EKG SITE RITE  Result Date: 06/12/2020 If Site Rite image not  attached, placement could not be confirmed due to current cardiac rhythm.   Anti-infectives: Anti-infectives (From admission, onward)   Start     Dose/Rate Route Frequency Ordered Stop   05/30/20 0600  cefoTEtan (CEFOTAN) 2 g in sodium chloride 0.9 % 100 mL IVPB        2 g 200 mL/hr over 30 Minutes Intravenous To Surgery 05/29/20 1430 05/30/20 1745   05/29/20 1400  neomycin (MYCIFRADIN) tablet 1,000 mg       "And" Linked Group Details   1,000 mg Oral 3 times per day 05/29/20 1152 05/29/20 2228   05/29/20 1400  metroNIDAZOLE (FLAGYL) tablet 1,000 mg       "And" Linked Group Details   1,000 mg Oral 3 times per day 05/29/20 1152 05/29/20 2228       Assessment/Plan COPD- PRN nebs Non-bleeding duodenal ulcer  Grade B esophogitis, gastritis, esophageal stenosis s/p dilation 4/2 Grade II internal hemorrhoids  Alcohol use disorder History of tobacco use ABL anemia- hgb stableat 9.7. Dr. Burr Medico recommendedstarting him on ferrous sulfate 325 mg twice dailywhen ileus resolves.  Protein calorie malnutrition- Pre-alb 8.0 (4/11). plan to restart TPN today  Rectosigmoid colon cancer S/P robotic assisted LAR with end colostomy 05/30/20 Dr. Dema Severin - POD#13 -Path=T3N0 adenocarcinoma.Seen byDr. Allena Napoleon arrange follow up. - [facility]OC following for colostomy teaching - Keep K > 4 and Mg > 2 for bowel function. -removedJP4/16 - continue to encourage mobilization - Pulm toilet, IS -CT scan 4/11 w/ ileus. No postoperative fluid collection or other complication noted. - CT scan 4/18 with possible SBO and transition in the mid ileum, no evidence of intraabdominal abscess  FEN: NPO VTE: SCDs,Lovenox ID: neomycin/flagyl 4/5, cefotetan 4/6. None currently Foley: removed POD1. Voiding. Follow-Up- Dr. Dema Severin and Dr. Burr Medico  Plan: Patient continues to refuse NG tube. Keep NPO for now. He is agreeable to PICC/TPN. Mobilize.    LOS: 18 days    [facility] 06/12/2020, 8:59 AM Please see Amion for pager number during day hours 7:00am-4:30pm

## 2020-06-12 NOTE — Progress Notes (Signed)
HD#18 Subjective:  Overnight Events: CT adb showed SBO without any abscess. Patient was made NPO overnight and advised to place a NG tube for decompression. Patient however refused NG tube placement. No emesis overnight.   Patient is seen at bedside. He denies any episodes of emesis overnight. States that he is feeling well and would like to go home.   Objective:  Vital signs in last 24 hours: Vitals:   06/11/20 1832 06/11/20 2035 06/12/20 0038 06/12/20 0428  BP: 120/85 121/85 119/85 124/79  Pulse: (!) 121 (!) 120 (!) 111 (!) 108  Resp: 20 19 18 18   Temp: 98.4 F (36.9 C) 98.2 F (36.8 C) 98.2 F (36.8 C) 98.1 F (36.7 C)  TempSrc:      SpO2: 96% 96% 96% 96%  Weight:    59.6 kg  Height:       Supplemental O2: Room Air SpO2: 96 % O2 Flow Rate (L/min): 6 L/min   Physical Exam:  Physical Exam Constitutional:      General: He is not in acute distress. Eyes:     General:        Right eye: No discharge.        Left eye: No discharge.  Abdominal:     Comments: Good output seen in ostomy bag. Abdomen mildly distended. No pain to palpation.   Skin:    General: Skin is warm.  Neurological:     Mental Status: He is alert.  Psychiatric:        Mood and Affect: Mood normal.     Filed Weights   06/05/20 0500 06/06/20 0500 06/12/20 0428  Weight: 60.5 kg 58 kg 59.6 kg     Intake/Output Summary (Last 24 hours) at 06/12/2020 0626 Last data filed at 06/12/2020 0533 Gross per 24 hour  Intake 1354.17 ml  Output 1725 ml  Net -370.83 ml   Net IO Since Admission: -10,273.28 mL [06/12/20 0626]  Pertinent Labs: CBC Latest Ref Rng & Units 06/12/2020 06/11/2020 06/10/2020  WBC 4.0 - 10.5 K/uL 7.8 7.8 5.9  Hemoglobin 13.0 - 17.0 g/dL 9.7(L) 10.2(L) 8.5(L)  Hematocrit 39.0 - 52.0 % 32.0(L) 32.7(L) 28.6(L)  Platelets 150 - 400 K/uL 436(H) 360 412(H)    CMP Latest Ref Rng & Units 06/11/2020 06/09/2020 06/08/2020  Glucose 70 - 99 mg/dL 88 134(H) 125(H)  BUN 8 - 23 mg/dL 9 10 10    Creatinine 0.61 - 1.24 mg/dL 0.49(L) 0.41(L) 0.38(L)  Sodium 135 - 145 mmol/L 134(L) 136 134(L)  Potassium 3.5 - 5.1 mmol/L 4.5 4.0 3.8  Chloride 98 - 111 mmol/L 100 103 103  CO2 22 - 32 mmol/L 24 28 28   Calcium 8.9 - 10.3 mg/dL 8.3(L) 7.5(L) 7.5(L)  Total Protein 6.5 - 8.1 g/dL - - -  Total Bilirubin 0.3 - 1.2 mg/dL - - -  Alkaline Phos 38 - 126 U/L - - -  AST 15 - 41 U/L - - -  ALT 0 - 44 U/L - - -    Imaging: CT ABDOMEN PELVIS W CONTRAST  Result Date: 06/11/2020 CLINICAL DATA:  Abdominal distension. Rectal cancer with left colon resection left abdominal ostomy. EXAM: CT ABDOMEN AND PELVIS WITH CONTRAST TECHNIQUE: Multidetector CT imaging of the abdomen and pelvis was performed using the standard protocol following bolus administration of intravenous contrast. CONTRAST:  144mL OMNIPAQUE IOHEXOL 300 MG/ML  SOLN COMPARISON:  06/04/2020 FINDINGS: Lower chest: Atelectasis at the right lung base. Left lung base is clear. Small amount of pleural fluid  on the right. Fluid-filled esophagus. Hepatobiliary: Normal Pancreas: Normal Spleen: Normal Adrenals/Urinary Tract: Adrenal glands are normal. Kidneys are normal. Bladder is normal. Stomach/Bowel: Stomach is distended and fluid-filled. The small intestine is distended with gas and fluid to the level of the mid ileum where there is a transition zone. The terminal ileum in the colon are collapsed. This is indicative of small bowel obstruction. Previously placed drain has been removed. No evidence of intra-abdominal abscess. No complicating feature seen at the site of the ostomy or the mucous fistula. Vascular/Lymphatic: Aortic atherosclerosis. No aneurysm. IVC is normal. No adenopathy. Reproductive: Normal Other: None Musculoskeletal: Ordinary lower lumbar degenerative changes. IMPRESSION: 1. Small-bowel obstruction with transition zone in the mid ileum. Previously placed drain has been removed. No evidence of intra-abdominal abscess. 2. Atelectasis at the  right lung base. Small amount of pleural fluid on the right. 3. Aortic atherosclerosis. Aortic Atherosclerosis (ICD10-I70.0). Electronically Signed   By: Nelson Chimes M.D.   On: 06/11/2020 16:45    Assessment/Plan:   Principal Problem:   sigmoid colon cancer Active Problems:   Symptomatic anemia   Iron deficiency anemia due to chronic blood loss   Duodenal ulcer   Gastritis and gastroduodenitis   Gastroesophageal reflux disease with esophagitis without hemorrhage   Esophageal stricture   Protein-calorie malnutrition, severe   Aortic atherosclerosis (HCC)   Ileus following gastrointestinal surgery ([place])   Hyponatremia   Patient Summary: Patient is 65 yo gentlemanwith past medical history of questionable COPD,past left5,6,8ribs fracture who presented to the ED for dizziness, dyspnea with exertion,likely due to symptomatic anemia, found to have duodenal ulcer and acolorectal masss/p resection. Currently managing post-op ileus and SBO.   Colorectal mass(T3B, N0, Mx) Post-op Ileus Evidence of SBO on CT abdomen. He would benefit from NG tube decompression but patient declined. Continue NPO and resume TPN per surgery. If patient has more emesis, will advise NG tube placement.  - NPO, TPN, IV Lactated Ringer -Pain management with IV dilaudid PRN due to NPO status  -Noadjuvant chemotherapy per oncology.Follow-up with oncology in 1-60months  Non-bleeding duodenal ulcer Grade B esophagitis without bleeding, gastritis s/p bx, moderate esophageal stenosis s/p dilation Likely in the context of excessive NSAID use, heavy alcohol use and smoking - continue sucrafate - continue protonix 40mg poBID x10w, then reduce to 40mg  daily - gastric pathologynegative for H. Pylori  Iron deficiencyanemia2/2 GI bleed. Hgb stable - Startpo iron supplementwhen he can tolerate p.o.  Alcoholuse disorder -Hold thiamine and multivitamin per RD  New lung densitieswhich are  questioned to be post inflammatory vs atelectasis.  -can repeatimaging outpatient to reevaluate  Diet: TPN IVF: LR,125cc/hr VTE: Enoxaparin Code: Full PT/OT recs: Home Health, walker. TOC recs: home   Dispo: Anticipated discharge to Home in 3 days pending Post-op ileus and SBO management .   Gaylan Gerold, DO 06/12/2020, 6:26 AM Pager: [phone]  Please contact the on call pager after 5 pm and on weekends at [phone].

## 2020-06-12 NOTE — Progress Notes (Signed)
PHARMACY - TOTAL PARENTERAL NUTRITION CONSULT NOTE   Indication: Prolonged ileus (13 days post-op)  Patient Measurements: Height: 5\' 11"  (180.3 cm) Weight: 59.6 kg (131 lb 4.8 oz) IBW/kg (Calculated) : 75.3 TPN AdjBW (KG): 56.8 Body mass index is 18.31 kg/m. 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 Last standing weight was 54.9kg on 4/6 indicating about 5kg weight loss over 8 months. Patient had multiple episodes of n/v yesterday but none reported this morning. Has refused NG tube multiple times.   Patient previously on TPN which was weaned 06/10/2020. Pt tolerated full liquid diet x1 day, attempted regular diet x1 meal and had 2 emesis episodes. Pharmacy consulted for TPN.  Glucose / Insulin: No Hx DM, BG 88 this AM  Electrolytes: Na 134, CoCa 10.1, Mag 1.8, Electrolytes WNL Renal: Scr<1, CrCl 77.47ml/min  Hepatic: Albumin 1.7 (4/14), LFTs/Tbili WNL, Trigs 58 (4/13) Intake / Output; MIVF: LR @125ml /hr GI Imaging: 4/9 Abd XR: Moderate small bowel and gastric distention likely indicates postoperative ileus. 4/11 CT abd: fluid filled SB consistent w/ postoperative ileus 4/12 Abd XR: NGT placed 4/18 CT abd: SBO in mid-ileum  GI Surgeries / Procedures:  4/6 robotic assisted LAR with end colostomy   Central access: none - peripheral IV access only PICC orders placed 0900 06/12/2020  TPN start date: 06/05/2020-06/10/2020, restarted 06/12/2020  Nutritional Goals (per RD recommendation on 4/15): kCal: 2000-2200, Protein: 100-115g, Fluid: >/=2 L Goal TPN rate is 90 mL/hr (provides 101 g of protein and 2150 kcals per day)  Current Nutrition:  NPO    Plan:  Restart TPN at goal 34mL/hr at 1800 (provides 101gAA, 50g lipids, and 2150 kcals, estimated ~100% of needs)  Electrolytes in TPN: Mg 10 mEq/L, Ca 30mEq/L; Phos 17 mmol/L. Na 138mEq/L, K 74mEq/L, Cl:Ac 1:1 Add standard MVI and trace  elements to TPN  Discontinue thiamine order and add thiamine to TPN  Adjust MIVF to 35 mL/hr at 1800 Monitor TPN labs on Mon/Thurs + BMET/Mag/Phos Wednesday AM    Wilson Singer, PharmD PGY1 Pharmacy Resident 06/12/2020 9:17 AM

## 2020-06-12 NOTE — Progress Notes (Signed)
Peripherally Inserted Central Catheter Placement  The IV Nurse has discussed with the patient and/or persons authorized to consent for the patient, the purpose of this procedure and the potential benefits and risks involved with this procedure.  The benefits include less needle sticks, lab draws from the catheter, and the patient may be discharged home with the catheter. Risks include, but not limited to, infection, bleeding, blood clot (thrombus formation), and puncture of an artery; nerve damage and irregular heartbeat and possibility to perform a PICC exchange if needed/ordered by physician.  Alternatives to this procedure were also discussed.  B[employer] Power PICC patient education guide, fact sheet on infection prevention and patient information card has been provided to patient /or left at bedside.  Consent previously signed 06/05/20 1146.  Patient without additional questions and is agreeable to this insertion.  PICC Placement Documentation  PICC Double Lumen 06/12/20 PICC Right Brachial 38 cm 0 cm (Active)  Indication for Insertion or Continuance of Line Administration of hyperosmolar/irritating solutions (i.e. TPN, Vancomycin, etc.) 06/12/20 1219  Exposed Catheter (cm) 0 cm 06/12/20 1219  Site Assessment Clean;Dry;Intact 06/12/20 1219  Lumen #1 Status Flushed;Saline locked;Blood return noted 06/12/20 1219  Lumen #2 Status Flushed;Saline locked;Blood return noted 06/12/20 1219  Dressing Type Transparent 06/12/20 1219  Dressing Status Clean;Dry;Intact 06/12/20 1219  Antimicrobial disc in place? Yes 06/12/20 [place] Not Applicable 89/37/34 2876  Line Care Connections checked and tightened 06/12/20 1219  Line Adjustment (NICU/IV Team Only) No 06/12/20 1219  Dressing Intervention New dressing 06/12/20 1219  Dressing Change Due 06/19/20 06/12/20 [place], Nicolette Bang 06/12/2020, 12:21 PM

## 2020-06-13 ENCOUNTER — Other Ambulatory Visit: Payer: Self-pay

## 2020-06-13 LAB — CBC
HCT: 28.1 % — ABNORMAL LOW (ref 39.0–52.0)
Hemoglobin: 8.5 g/dL — ABNORMAL LOW (ref 13.0–17.0)
MCH: 27.2 pg (ref 26.0–34.0)
MCHC: 30.2 g/dL (ref 30.0–36.0)
MCV: 90.1 fL (ref 80.0–100.0)
Platelets: 399 10*3/uL (ref 150–400)
RBC: 3.12 MIL/uL — ABNORMAL LOW (ref 4.22–5.81)
RDW: 23.2 % — ABNORMAL HIGH (ref 11.5–15.5)
WBC: 9.1 10*3/uL (ref 4.0–10.5)
nRBC: 0 % (ref 0.0–0.2)

## 2020-06-13 LAB — BASIC METABOLIC PANEL
Anion gap: 4 — ABNORMAL LOW (ref 5–15)
BUN: 14 mg/dL (ref 8–23)
CO2: 28 mmol/L (ref 22–32)
Calcium: 7.4 mg/dL — ABNORMAL LOW (ref 8.9–10.3)
Chloride: 102 mmol/L (ref 98–111)
Creatinine, Ser: 0.44 mg/dL — ABNORMAL LOW (ref 0.61–1.24)
GFR, Estimated: >60 mL/min (ref >60)
Glucose, Bld: 146 mg/dL — ABNORMAL HIGH (ref 70–99)
Potassium: 3.3 mmol/L — ABNORMAL LOW (ref 3.5–5.1)
Sodium: 134 mmol/L — ABNORMAL LOW (ref 135–145)

## 2020-06-13 LAB — GLUCOSE, CAPILLARY
Glucose-Capillary: 161 mg/dL — ABNORMAL HIGH (ref 70–99)
Glucose-Capillary: 143 mg/dL — ABNORMAL HIGH (ref 70–99)

## 2020-06-13 LAB — MAGNESIUM: Magnesium: 1.6 mg/dL — ABNORMAL LOW (ref 1.7–2.4)

## 2020-06-13 LAB — PHOSPHORUS: Phosphorus: 3.2 mg/dL (ref 2.5–4.6)

## 2020-06-13 LAB — TRIGLYCERIDES: Triglycerides: 49 mg/dL (ref <150)

## 2020-06-13 LAB — PREALBUMIN: Prealbumin: 5.6 mg/dL — ABNORMAL LOW (ref 18–38)

## 2020-06-13 MED ORDER — TRAVASOL 10 % IV SOLN
INTRAVENOUS | Status: AC
  Filled 2020-06-13: qty 1015.2

## 2020-06-13 MED ORDER — MAGNESIUM SULFATE 2 GM/50ML IV SOLN
2.0000 g | Freq: Once | INTRAVENOUS | Status: AC
  Administered 2020-06-13: 2 g via INTRAVENOUS
  Filled 2020-06-13: qty 50

## 2020-06-13 MED ORDER — POTASSIUM CHLORIDE 10 MEQ/50ML IV SOLN
10.0000 meq | INTRAVENOUS | Status: DC
  Filled 2020-06-13 (×3): qty 50

## 2020-06-13 MED ORDER — FUROSEMIDE 10 MG/ML IJ SOLN
40.0000 mg | Freq: Once | INTRAMUSCULAR | Status: AC
  Administered 2020-06-13: 40 mg via INTRAVENOUS
  Filled 2020-06-13: qty 4

## 2020-06-13 MED ORDER — POTASSIUM CHLORIDE 10 MEQ/100ML IV SOLN
10.0000 meq | INTRAVENOUS | Status: AC
  Administered 2020-06-13 (×4): 10 meq via INTRAVENOUS
  Filled 2020-06-13 (×5): qty 100

## 2020-06-13 NOTE — Progress Notes (Signed)
Progress Note: General Surgery Service   Chief Complaint/Subjective: Coughing more today  Objective: Vital signs in last 24 hours: Temp:  [97.9 F (36.6 C)-98.7 F (37.1 C)] 97.9 F (36.6 C) (04/20 0403) Pulse Rate:  [102-110] 102 (04/20 0403) Resp:  [18-19] 19 (04/20 0403) BP: (115-126)/(75-83) 118/75 (04/20 0403) SpO2:  [93 %-96 %] 94 % (04/20 0403) Last BM Date: 06/12/20  Intake/Output from previous day: 04/19 0701 - 04/20 0700 In: 1588 [P.O.:200; I.V.:1388] Out: 2125 [Urine:425; Emesis/NG output:1450; Stool:250] Intake/Output this shift: No intake/output data recorded.  Gen: NAD  Resp: nonlabored  Card: tachycardic  Abd: soft, mod distension, NG with thick output, ostomy pink and patent with small amount of liquid stool in bag  Lab Results: CBC  Recent Labs    06/12/20 0244 06/13/20 0415  WBC 7.8 9.1  HGB 9.7* 8.5*  HCT 32.0* 28.1*  PLT 436* 399   BMET Recent Labs    06/12/20 0244 06/13/20 0415  NA 133* 134*  K 4.6 3.3*  CL 102 102  CO2 23 28  GLUCOSE 82 146*  BUN 11 14  CREATININE 0.51* 0.44*  CALCIUM 7.9* 7.4*   PT/INR No results for input(s): LABPROT, INR in the last 72 hours. ABG No results for input(s): PHART, HCO3 in the last 72 hours.  Invalid input(s): PCO2, PO2  Anti-infectives: Anti-infectives (From admission, onward)   Start     Dose/Rate Route Frequency Ordered Stop   05/30/20 0600  cefoTEtan (CEFOTAN) 2 g in sodium chloride 0.9 % 100 mL IVPB        2 g 200 mL/hr over 30 Minutes Intravenous To Surgery 05/29/20 1430 05/30/20 1745   05/29/20 1400  neomycin (MYCIFRADIN) tablet 1,000 mg       "And" Linked Group Details   1,000 mg Oral 3 times per day 05/29/20 1152 05/29/20 2228   05/29/20 1400  metroNIDAZOLE (FLAGYL) tablet 1,000 mg       "And" Linked Group Details   1,000 mg Oral 3 times per day 05/29/20 1152 05/29/20 2228      Medications: Scheduled Meds: . Chlorhexidine Gluconate Cloth  6 each Topical Daily  . enoxaparin  (LOVENOX) injection  40 mg Subcutaneous Q24H  . feeding supplement  237 mL Oral TID BM  . furosemide  40 mg Intravenous Once  . lidocaine  1 patch Transdermal Q24H  . pantoprazole (PROTONIX) IV  40 mg Intravenous Q12H  . sucralfate  1 g Oral TID WC & HS   Continuous Infusions: . magnesium sulfate bolus IVPB    . potassium chloride 10 mEq (06/13/20 0835)  . potassium chloride    . TPN ADULT (ION) 90 mL/hr at 06/12/20 1748  . TPN ADULT (ION)     PRN Meds:.guaiFENesin, hydrocortisone, HYDROmorphone (DILAUDID) injection, ipratropium-albuterol, LORazepam, menthol-cetylpyridinium, methocarbamol, ondansetron (ZOFRAN) IV, phenol, sodium chloride flush, sodium chloride flush  Assessment/Plan: s/p Procedure(s): XI ROBOTIC ASSISTED LOWER ANTERIOR RESECTION WITH END COLOSTOMY CREATION COLOSTOMY 05/30/2020 POD#14 -Path=T3N0 adenocarcinoma.Seen byDr. Allena Napoleon arrange follow up. - WOC following for colostomy teaching - Keep K > 4 and Mg > 2 for bowel function. -removedJP4/16 - continue to encourage mobilization - Pulm toilet, IS -CT scan 4/11 w/ ileus. No postoperative fluid collection or other complication noted. - CT scan 4/18 with possible SBO and transition in the mid ileum, no evidence of intraabdominal abscess  Edema and crackles - lasix and potassium today, remove excess IV fluids  FEN:NPO, TPN, NG tube LIWS VTE: SCDs,Lovenox ID: neomycin/flagyl 4/5, cefotetan 4/6. None currently  Foley: removed POD1. Voiding. Follow-Up- Dr. Dema Severin and Dr. Burr Medico  Plan: Patient continues to refuse NG tube. Keep NPO for now. He is agreeable to PICC/TPN. Mobilize.     LOS: 19 days   Mickeal Skinner, MD [facility]

## 2020-06-13 NOTE — Progress Notes (Signed)
   06/13/20 1545  Assess: MEWS Score  Temp 98.1 F (36.7 C)  BP 125/82  Pulse Rate (!) 111  Resp 18  Level of Consciousness Alert  SpO2 95 %  O2 Device Room Air  Patient Activity (if Appropriate) In chair  Assess: MEWS Score  MEWS Temp 0  MEWS Systolic 0  MEWS Pulse 2  MEWS RR 0  MEWS LOC 0  MEWS Score 2  MEWS Score Color Yellow  Assess: if the MEWS score is Yellow or Red  Were vital signs taken at a resting state? Yes  Focused Assessment No change from prior assessment  Early Detection of Sepsis Score *See Row Information* High  MEWS guidelines implemented *See Row Information* No, previously yellow, continue vital signs every 4 hours  Treat  Pain Scale 0-10  Pain Score 4  Pain Type Acute pain  Pain Descriptors / Indicators Aching  Pain Frequency Intermittent  Pain Onset On-going  Pain Intervention(s) Medication (See eMAR)  Breathing 0  Negative Vocalization 0  Body Language 0  Interventions Medication (see MAR)  Notify: Provider  Provider Name/Title Gaylan Gerold  Date Provider Notified 06/13/20  Time Provider Notified 1545  Notification Type Call  Notification Reason Change in status  Provider response No new orders  Date of Provider Response 06/13/20  Time of Provider Response 1545  A&O X4, c/o of 4/10 pain, denies nausea Alease Frame MD notified, no new orders stated they know pt has been fluctuating on the meaws score. In addition ok to hold sucralfate and ensure

## 2020-06-13 NOTE — Progress Notes (Signed)
Occupational Therapy Treatment Patient Details Name: Marc Mcdonald MRN: [id number] DOB: 1955/07/30 Today's Date: 06/13/2020    History of present illness Pt adm 4/1 with dizziness, weakness, and dyspnea. Pt with Hgb of 5.5 and found to have duodenal ulcer and rectal mass. Oncology and surgery involved and staging of rectal CA in process. Pt had surgery on 4/7 to place an End Ostomy. CT 4/19 showed possible SBO with transition in the mid ileum; PMH - ?copd, lt rib fx's 08/2019, etoh   OT comments  Pt progressing incrementally towards OT goals, pt reports he is mainly hindered by all the lines and feeling overall lousy and nauseous. During the session pt requested to ambulate in the room and complete 2 transfers between the bed and recliner. Pt able to ambulate with min guard and RW, requiring Min assist to stand from recliner and bed. OT will continue working with pt to progress towards independence.    Follow Up Recommendations  Home health OT    Equipment Recommendations  Tub/shower seat    Recommendations for Other Services      Precautions / Restrictions Precautions Precautions: Fall Precaution Comments: NG tube in place       Mobility Bed Mobility Overal bed mobility: Needs Assistance Bed Mobility: Sit to Supine       Sit to supine: Modified independent (Device/Increase time)   General bed mobility comments: Pt recieved in recliner at beginning of session    Transfers Overall transfer level: Needs assistance Equipment used: Rolling walker (2 wheeled) Transfers: Sit to/from Stand Sit to Stand: Min assist         General transfer comment: Pt needed min assist to stand from recliner/bed x2    Balance Overall balance assessment: Needs assistance Sitting-balance support: Feet supported;No upper extremity supported Sitting balance-Leahy Scale: Good     Standing balance support: Bilateral upper extremity supported Standing balance-Leahy Scale: Fair                              ADL either performed or assessed with clinical judgement   ADL Overall ADL's : Needs assistance/impaired Eating/Feeding: NPO Eating/Feeding Details (indicate cue type and reason): Pt took meds and drank from a cup sitting in recliner                             Tub/ Shower Transfer: Minimal assistance;Ambulation Tub/Shower Transfer Details (indicate cue type and reason): simulated with transfer to/from recliner. Requiring min assist to power to standing, able to ambulate with min guard and RW Functional mobility during ADLs: Min guard;Rolling walker General ADL Comments: Pt deffered grooming/self care this session, requesting to move around the room instead, then lay down.     Vision       Perception     Praxis      Cognition Arousal/Alertness: Awake/alert Behavior During Therapy: WFL for tasks assessed/performed Overall Cognitive Status: Within Functional Limits for tasks assessed                                          Exercises     Shoulder Instructions       General Comments VSS on RA    Pertinent Vitals/ Pain       Pain Assessment: No/denies pain  Home Living  Prior Functioning/Environment              Frequency  Min 2X/week        Progress Toward Goals  OT Goals(current goals can now be found in the care plan section)  Progress towards OT goals: Progressing toward goals  Acute Rehab OT Goals Patient Stated Goal: go home OT Goal Formulation: With patient Time For Goal Achievement: 06/25/20 Potential to Achieve Goals: Good ADL Goals Pt Will Perform Grooming: Independently;standing Additional ADL Goal #1: Pt will perform UE and LE dressing sitting, with mod I, using AE as needed. Additional ADL Goal #2: Pt will report and demonstrate 3 fall prevention techniques.  Plan Discharge plan remains appropriate;Frequency remains  appropriate    Co-evaluation                 AM-PAC OT "6 Clicks" Daily Activity     Outcome Measure   Help from another person eating meals?: None Help from another person taking care of personal grooming?: A Little Help from another person toileting, which includes using toliet, bedpan, or urinal?: A Little Help from another person bathing (including washing, rinsing, drying)?: A Little Help from another person to put on and taking off regular upper body clothing?: None Help from another person to put on and taking off regular lower body clothing?: A Little 6 Click Score: 20    End of Session    OT Visit Diagnosis: Unsteadiness on feet (R26.81);Muscle weakness (generalized) (M62.81)   Activity Tolerance     Patient Left     Nurse Communication          Time: 1610-9604 OT Time Calculation (min): 12 min  Charges: OT General Charges $OT Visit: 1 Visit OT Treatments $Self Care/Home Management : 8-22 mins  Bryla Burek H., OTR/L Acute Rehabilitation  Janelle Spellman Elane Bing Plume 06/13/2020, 5:23 PM

## 2020-06-13 NOTE — Progress Notes (Addendum)
PHARMACY - TOTAL PARENTERAL NUTRITION CONSULT NOTE   Indication: Prolonged ileus (14 days post-op)  Patient Measurements: Height: 5\' 11"  (180.3 cm) Weight: 59.6 kg (131 lb 4.8 oz) IBW/kg (Calculated) : 75.3 TPN AdjBW (KG): 56.8 Body mass index is 18.31 kg/m. 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 Last standing weight was 54.9kg on 4/6 indicating about 5kg weight loss over 8 months. Patient had multiple episodes of n/v yesterday but none reported this morning. Has refused NG tube multiple times.   Patient previously on TPN which was weaned 06/10/2020. Pt tolerated full liquid diet x1 day, attempted regular diet x1 meal and had 2 emesis episodes. Pharmacy consulted for TPN.  Glucose / Insulin: No Hx DM, BGs 140-170s this AM  Electrolytes: Na 134, CoCa 9.3, Mag 1.6, K 3.3, other electrolytes WNL Renal: Scr<1, CrCl 77.34ml/min  Hepatic: Albumin 1.7 (4/14), LFTs/Tbili WNL, Trigs 49 (4/20) Intake / Output; MIVF: LR @125ml /hr GI Imaging: 4/9 Abd XR: Moderate small bowel and gastric distention likely indicates postoperative ileus. 4/11 CT abd: fluid filled SB consistent w/ postoperative ileus 4/12 Abd XR: NGT placed 4/18 CT abd: SBO in mid-ileum 4/19 Abd XR: NG tube tip proximal stomach placement  GI Surgeries / Procedures:  4/6 robotic assisted LAR with end colostomy   Central access: none - peripheral IV access only PICC orders placed 0900 06/12/2020  TPN start date: 06/05/2020-06/10/2020, restarted 06/12/2020  Nutritional Goals (per RD recommendation on 4/15): kCal: 2000-2200, Protein: 100-115g, Fluid: >/=2 L Goal TPN rate is 90 mL/hr (provides 101 g of protein and 2150 kcals per day)  Current Nutrition:  NPO    Plan: Continue TPN at goal 16mL/hr at 1800 (provides 101gAA, 50g lipids, and 2150 kcals, estimated ~100% of needs)  Electrolytes in TPN: Mg 10 mEq/L, Ca 38mEq/L; Phos  17 mmol/L, Na 138mEq/L, K 42mEq/L, Cl:Ac 1:1 Mag 2g IV x1 and KCl 35meq IV x7 per MD + Lasix 40mg  IV x1 Add standard MVI and trace elements to TPN  Discontinue thiamine order and add thiamine to TPN  Discontinue MIVF Monitor TPN labs on Mon/Thurs   Wilson Singer, PharmD PGY1 Pharmacy Resident 06/13/2020 7:14 AM

## 2020-06-13 NOTE — Progress Notes (Signed)
Physical Therapy Treatment Patient Details Name: Marc Mcdonald MRN: [id number] DOB: [birth year] Today's Date: 06/13/2020    History of Present Illness Pt adm 4/1 with dizziness, weakness, and dyspnea. Pt with Hgb of 5.5 and found to have duodenal ulcer and rectal mass. Oncology and surgery involved and staging of rectal CA in process. Pt had surgery on 4/7 to place an End Ostomy. CT 4/19 showed possible SBO with transition in the mid ileum; PMH - ?copd, lt rib fx's 08/2019, etoh    PT Comments    Continuing work on functional mobility and activity tolerance;  Nausea still present, but less so, and pt willing to get up and OOB; HR with notable increase with simple activity, 140 observed highest; Notable SBP decr of 21 mmHg between sitting and standing, concurrent with incr HR; Pt asymptomatic for dizziness; RN notified    06/13/20 1115 06/13/20 1120  Vital Signs  Patient Position (if appropriate) Orthostatic Vitals  --   Orthostatic Lying   BP- Lying (!) 149/101 (May be a mis-read; pt was quite tense)  --   Pulse- Lying 107  --   Orthostatic Sitting  BP- Sitting 141/78 (!) 125/93 (after standing/short amb in room)  Pulse- Sitting 117 123  Orthostatic Standing at 0 minutes  BP- Standing at 0 minutes 120/84  --   Pulse- Standing at 0 minutes 130  --      Follow Up Recommendations  Home health PT;Supervision for mobility/OOB     Equipment Recommendations  Rolling walker with 5" wheels    Recommendations for Other Services       Precautions / Restrictions Precautions Precautions: Fall;Other (comment) Precaution Comments: Pt has JP Drain R lower abdomen    Mobility  Bed Mobility Overal bed mobility: Needs Assistance Bed Mobility: Rolling;Sidelying to Sit Rolling: Supervision Sidelying to sit: Min assist       General bed mobility comments: Min handheld assist to pull to sit    Transfers Overall transfer level: Needs assistance Equipment used: Rolling walker (2  wheeled) Transfers: Sit to/from Stand Sit to Stand: Min assist;Mod assist         General transfer comment: Stood from bed with initial min assist, but posterior bias and sat back to bed  unexpectedly x2; Light mod assist third sit to stand for initial balance, and keeping from leaning back  Ambulation/Gait Ambulation/Gait assistance: Min guard Gait Distance (Feet): 10 Feet Assistive device: Rolling walker (2 wheeled) Gait Pattern/deviations: Step-through pattern Gait velocity: decr   General Gait Details: steady with use of RW, no physical assist; close guard for lines   Stairs             Wheelchair Mobility    Modified Rankin (Stroke Patients Only)       Balance     Sitting balance-Leahy Scale: Good       Standing balance-Leahy Scale: Poor                              Cognition Arousal/Alertness: Awake/alert Behavior During Therapy: WFL for tasks assessed/performed;Flat affect Overall Cognitive Status: Within Functional Limits for tasks assessed                                 General Comments: Frustrated with situation, lines/leads, etc; gentle encouragement and straightforward education re: need to incr activity      Exercises  General Comments       Pertinent Vitals/Pain Pain Assessment: Faces Faces Pain Scale: Hurts a little bit Pain Location: Back pain with movement Pain Descriptors / Indicators: Grimacing Pain Intervention(s): Monitored during session;Repositioned    Home Living                      Prior Function            PT Goals (current goals can now be found in the care plan section) Acute Rehab PT Goals Patient Stated Goal: go home PT Goal Formulation: With patient Time For Goal Achievement: 06/25/20 Potential to Achieve Goals: Fair Progress towards PT goals: Progressing toward goals (slowly)    Frequency    Min 3X/week      PT Plan Current plan remains appropriate     Co-evaluation              AM-PAC PT "6 Clicks" Mobility   Outcome Measure  Help needed turning from your back to your side while in a flat bed without using bedrails?: A Little Help needed moving from lying on your back to sitting on the side of a flat bed without using bedrails?: A Little Help needed moving to and from a bed to a chair (including a wheelchair)?: A Little Help needed standing up from a chair using your arms (e.g., wheelchair or bedside chair)?: A Little Help needed to walk in hospital room?: A Little Help needed climbing 3-5 steps with a railing? : A Lot 6 Click Score: 17    End of Session   Activity Tolerance: Patient tolerated treatment well Patient left: in chair;with call bell/phone within reach;with nursing/sitter in room Nurse Communication: Mobility status;Other (comment) (SBP drop of 20 mmHg between sitting and standing) PT Visit Diagnosis: Unsteadiness on feet (R26.81);Muscle weakness (generalized) (M62.81);History of falling (Z91.81)     Time: 5400-8676 PT Time Calculation (min) (ACUTE ONLY): 22 min  Charges:  $Gait Training: 8-22 mins                     Roney [place], [place]  Acute Rehabilitation Services Pager [phone] Office [place] 06/13/2020, 1:05 PM

## 2020-06-13 NOTE — Progress Notes (Signed)
Nutrition Follow-up  DOCUMENTATION CODES:   Severe malnutrition in context of chronic illness,Underweight  INTERVENTION:   -D/c Ensure Enlive po TID, each supplement provides 350 kcal and 20 grams of protein -TPN management per pharmacy  NUTRITION DIAGNOSIS:   Inadequate oral intake related to poor appetite,social / environmental circumstances,altered GI function as evidenced by NPO status.  Ongoing  GOAL:   Patient will meet greater than or equal to 90% of their needs  Met with TPN  MONITOR:   Diet advancement,PO intake,Labs,Weight trends  REASON FOR ASSESSMENT:   Consult New TPN/TNA  ASSESSMENT:   65 yo male admitted with symptomatic anemia, dyspnea on exertion, hyponatremia. PMH includes COPD, EtOH abuse (12-15 beers per day) and tobacco abuse (1 pack every other day)  4/06 Robotic assisted LAR with end colostomy 4/10 +emesis 4/11 CT abdomen: ileus 4/12 PICC line placed, NG placed for decompression 4/16 NGT d/c, advanced to full liquid diet 4/17 advanced to regular diet 4/18 bilious emesis with pink ostomy output, CT of abdomen and pelvis reveal new SBO 4/19 TPN resumed, PICC placed  Reviewed I/O's: -537 ml x 24 hours and -8.3 L since 05/30/20  UOP: 425 ml x 24 hours   NGT output: 1.5 L x 24 hours  Colostomy output: 250 ml x 24 hours  Pt continues to refuse NGT placement. He remains NPO and receiving TPN at 90 ml/hr for nutritional support- regimen provides 2150 kcals and 101 grams protein, meeting 100% of estimated kcal and protein needs.   Labs reviewed: Na: 134, K: 3.3 (on IV supplementation), Mg: 1.6 (on IV supplementation), Phos WDL, CBGS: 161-170 (inpatient orders for glycemic control are ).   Diet Order:   Diet Order            Diet NPO time specified Except for: Ice Chips, Sips with Meds  Diet effective now                 EDUCATION NEEDS:   Not appropriate for education at this time  Skin:  Skin Assessment: Skin Integrity Issues: Skin  Integrity Issues:: Incisions Incisions: closed abdomen  Last BM:  06/13/20 (25 ml output via colostomy)  Height:   Ht Readings from Last 1 Encounters:  05/26/20 5' 11"  (1.803 m)    Weight:   Wt Readings from Last 1 Encounters:  06/12/20 59.6 kg    Ideal Body Weight:  78.2 kg  BMI:  Body mass index is 18.31 kg/m.  Estimated Nutritional Needs:   Kcal:  2000-2200 kcals  Protein:  100-115 g  Fluid:  >/= 2 L    Loistine Chance, RD, LDN, [place] Registered Dietitian II Certified Diabetes Care and Education Specialist Please refer to [facility] for RD and/or RD on-call/weekend/after hours pager

## 2020-06-13 NOTE — Progress Notes (Signed)
HD#19 Subjective:  No signfiicant overnight events. Feeling ok this morning. Hopes to discharge soon.  Objective:  Vital signs in last 24 hours: Vitals:   06/12/20 0428 06/12/20 1611 06/12/20 2059 06/13/20 0403  BP: 124/79 126/83 115/75 118/75  Pulse: (!) 108 (!) 110 (!) 103 (!) 102  Resp: 18 18  19   Temp: 98.1 F (36.7 C) 98.7 F (37.1 C) 98.5 F (36.9 C) 97.9 F (36.6 C)  TempSrc:  Oral Oral Oral  SpO2: 96% 93% 96% 94%  Weight: 59.6 kg     Height:       Supplemental O2: Room Air SpO2: 94 % O2 Flow Rate (L/min): 6 L/min   Physical Exam:   Filed Weights   06/05/20 0500 06/06/20 0500 06/12/20 0428  Weight: 60.5 kg 58 kg 59.6 kg     Intake/Output Summary (Last 24 hours) at 06/13/2020 0744 Last data filed at 06/13/2020 0737 Gross per 24 hour  Intake 1587.95 ml  Output 2125 ml  Net -537.05 ml   Net IO Since Admission: -10,810.33 mL [06/13/20 0744]   General: chronically ill appearing, sitting up in the recliner Cardiac: no LE edema GI: NGT to suction, abdomen distended. Not tender to palpation. Bowel sounds active but may be difficult to interpret with tube to suction. Ostomy pink. Small amount of dark colored stool in bag.  Pertinent Labs: CBC Latest Ref Rng & Units 06/13/2020 06/12/2020 06/11/2020  WBC 4.0 - 10.5 K/uL 9.1 7.8 7.8  Hemoglobin 13.0 - 17.0 g/dL 8.5(L) 9.7(L) 10.2(L)  Hematocrit 39.0 - 52.0 % 28.1(L) 32.0(L) 32.7(L)  Platelets 150 - 400 K/uL 399 436(H) 360    CMP Latest Ref Rng & Units 06/13/2020 06/12/2020 06/11/2020  Glucose 70 - 99 mg/dL 146(H) 82 88  BUN 8 - 23 mg/dL 14 11 9   Creatinine 0.61 - 1.24 mg/dL 0.44(L) 0.51(L) 0.49(L)  Sodium 135 - 145 mmol/L 134(L) 133(L) 134(L)  Potassium 3.5 - 5.1 mmol/L 3.3(L) 4.6 4.5  Chloride 98 - 111 mmol/L 102 102 100  CO2 22 - 32 mmol/L 28 23 24   Calcium 8.9 - 10.3 mg/dL 7.4(L) 7.9(L) 8.3(L)  Total Protein 6.5 - 8.1 g/dL - - -  Total Bilirubin 0.3 - 1.2 mg/dL - - -  Alkaline Phos 38 - 126 U/L - - -   AST 15 - 41 U/L - - -  ALT 0 - 44 U/L - - -    Imaging: DG Abd 1 View  Result Date: 06/12/2020 CLINICAL DATA:  NG tube placement EXAM: ABDOMEN - 1 VIEW COMPARISON:  06/05/2020 FINDINGS: NG tube tip is in the proximal stomach. Proximal side port of the NG tube is above the GE junction. Tube could be advanced approximately 5-6 cm to place the proximal side port below the GE junction. Right central line tip overlies the mid SVC level. Gaseous small bowel distension noted upper abdomen. IMPRESSION: NG tube tip is in the proximal stomach with proximal side port above the GE junction. See above. Electronically Signed   By: Misty Stanley M.D.   On: 06/12/2020 14:44   [place] EKG SITE RITE  Result Date: 06/12/2020 If Site Rite image not attached, placement could not be confirmed due to current cardiac rhythm.   Assessment/Plan:   Principal Problem:   sigmoid colon cancer Active Problems:   Symptomatic anemia   Iron deficiency anemia due to chronic blood loss   Duodenal ulcer   Gastritis and gastroduodenitis   Gastroesophageal reflux disease with esophagitis without hemorrhage  Esophageal stricture   Protein-calorie malnutrition, severe   Aortic atherosclerosis (HCC)   Ileus following gastrointestinal surgery ([place])   Hyponatremia   Patient Summary: Patient is 65 yo gentlemanwith past medical history of questionable COPD,past left5,6,8ribs fracture who presented to the ED for dizziness, dyspnea with exertion,likely due to symptomatic anemia, found to have duodenal ulcer and acolorectal masss/p resection. Currently managing post-op ileus and SBO.   Colorectal mass(T3B, N0, Mx) Post-op Ileus Patient agreed to NG tube placement.  Continue NPO and TPN per surgery.  -NPO, TPN, IV Lactated Ringer -Pain management with IV dilaudid PRN due to NPO status  -Noadjuvant chemotherapy per oncology.Follow-up with oncology in 1-58months  Non-bleeding duodenal ulcer Grade B esophagitis  without bleeding, gastritis s/p bx, moderate esophageal stenosis s/p dilation Likely in the context of excessive NSAID use, heavy alcohol use and smoking - continue sucrafate - continue protonix 40mg poBID x10w, then reduce to 40mg  daily - gastric pathologynegative for H. Pylori  Iron deficiencyanemia2/2 GI bleed. Hgb stable - Startpo iron supplementwhen he can tolerate p.o.  Alcoholuse disorder -Hold thiamine and multivitamin per RD  New lung densitieswhich are questioned to be post inflammatory vs atelectasis.  -can repeatimaging outpatient to reevaluate  Diet: TPN IVF: LR,125cc/hr VTE: Enoxaparin Code: Full PT/OT recs: Home Health, walker. TOC recs: home   Dispo: Anticipated discharge to Home in 3 days pending Post-op ileus and SBO management .   Gaylan Gerold, DO 06/13/2020, 7:44 AM Pager: [phone]  Please contact the on call pager after 5 pm and on weekends at [phone].

## 2020-06-14 ENCOUNTER — Inpatient Hospital Stay (HOSPITAL_COMMUNITY): Payer: Medicare Other

## 2020-06-14 LAB — CBC
HCT: 28.2 % — ABNORMAL LOW (ref 39.0–52.0)
Hemoglobin: 8.5 g/dL — ABNORMAL LOW (ref 13.0–17.0)
MCH: 27.2 pg (ref 26.0–34.0)
MCHC: 30.1 g/dL (ref 30.0–36.0)
MCV: 90.1 fL (ref 80.0–100.0)
Platelets: 378 10*3/uL (ref 150–400)
RBC: 3.13 MIL/uL — ABNORMAL LOW (ref 4.22–5.81)
RDW: 22.7 % — ABNORMAL HIGH (ref 11.5–15.5)
WBC: 8.2 10*3/uL (ref 4.0–10.5)
nRBC: 0 % (ref 0.0–0.2)

## 2020-06-14 LAB — MAGNESIUM: Magnesium: 2 mg/dL (ref 1.7–2.4)

## 2020-06-14 LAB — COMPREHENSIVE METABOLIC PANEL
ALT: 12 U/L (ref 0–44)
AST: 14 U/L — ABNORMAL LOW (ref 15–41)
Albumin: 1.4 g/dL — ABNORMAL LOW (ref 3.5–5.0)
Alkaline Phosphatase: 63 U/L (ref 38–126)
Anion gap: 5 (ref 5–15)
BUN: 11 mg/dL (ref 8–23)
CO2: 29 mmol/L (ref 22–32)
Calcium: 7.6 mg/dL — ABNORMAL LOW (ref 8.9–10.3)
Chloride: 102 mmol/L (ref 98–111)
Creatinine, Ser: 0.35 mg/dL — ABNORMAL LOW (ref 0.61–1.24)
GFR, Estimated: >60 mL/min (ref >60)
Glucose, Bld: 131 mg/dL — ABNORMAL HIGH (ref 70–99)
Potassium: 3.5 mmol/L (ref 3.5–5.1)
Sodium: 136 mmol/L (ref 135–145)
Total Bilirubin: 0.4 mg/dL (ref 0.3–1.2)
Total Protein: 4.7 g/dL — ABNORMAL LOW (ref 6.5–8.1)

## 2020-06-14 LAB — PHOSPHORUS: Phosphorus: 2.9 mg/dL (ref 2.5–4.6)

## 2020-06-14 IMAGING — DX DG CHEST 1V PORT
1 series · 2 of 2 positions shown · non-contrast
Comparison: [DATE]

CLINICAL DATA: Wheezing

EXAM:
PORTABLE CHEST 1 VIEW

[Series 1: chest ap · 0.14mm/px · 2 of 2 slices shown]
[im 1/2]
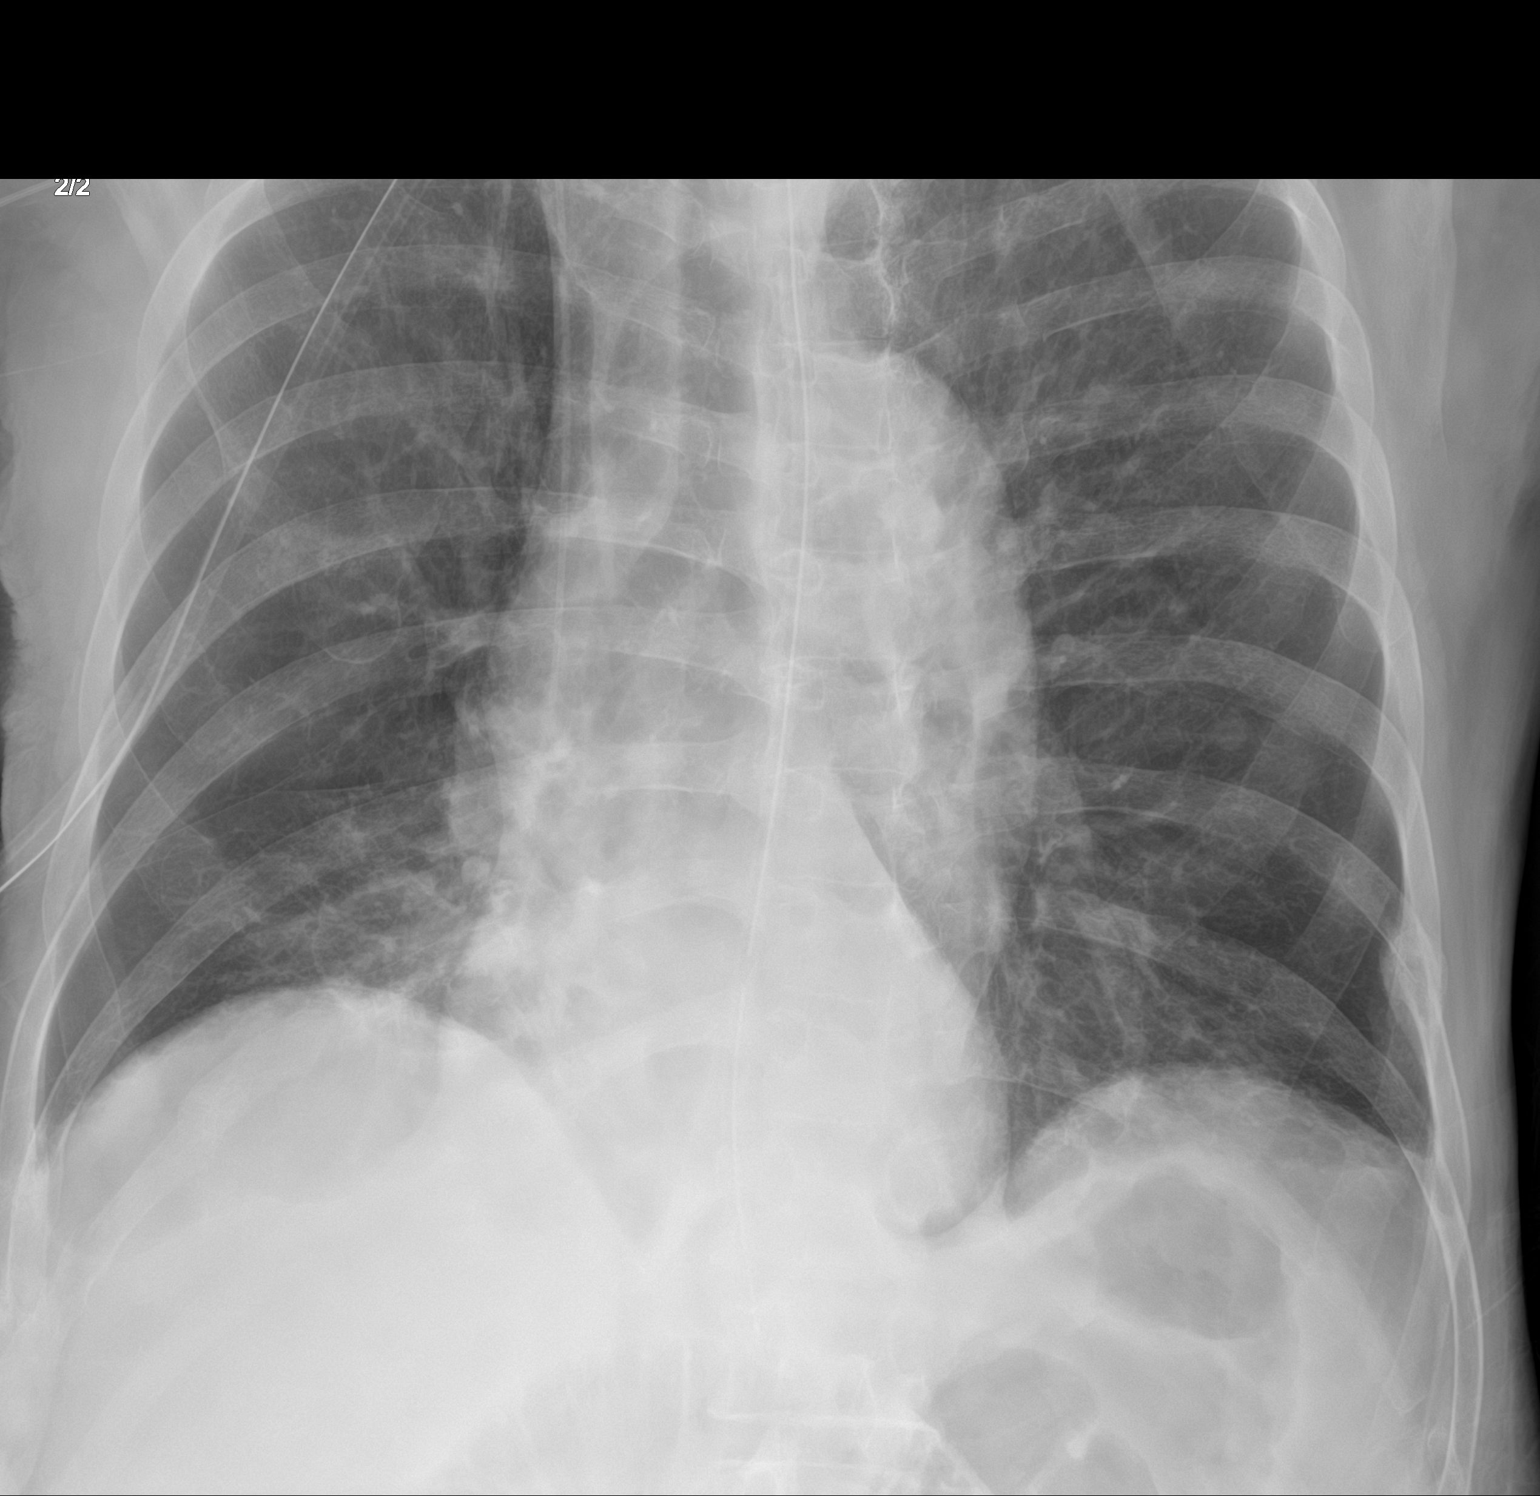
[im 2/2]
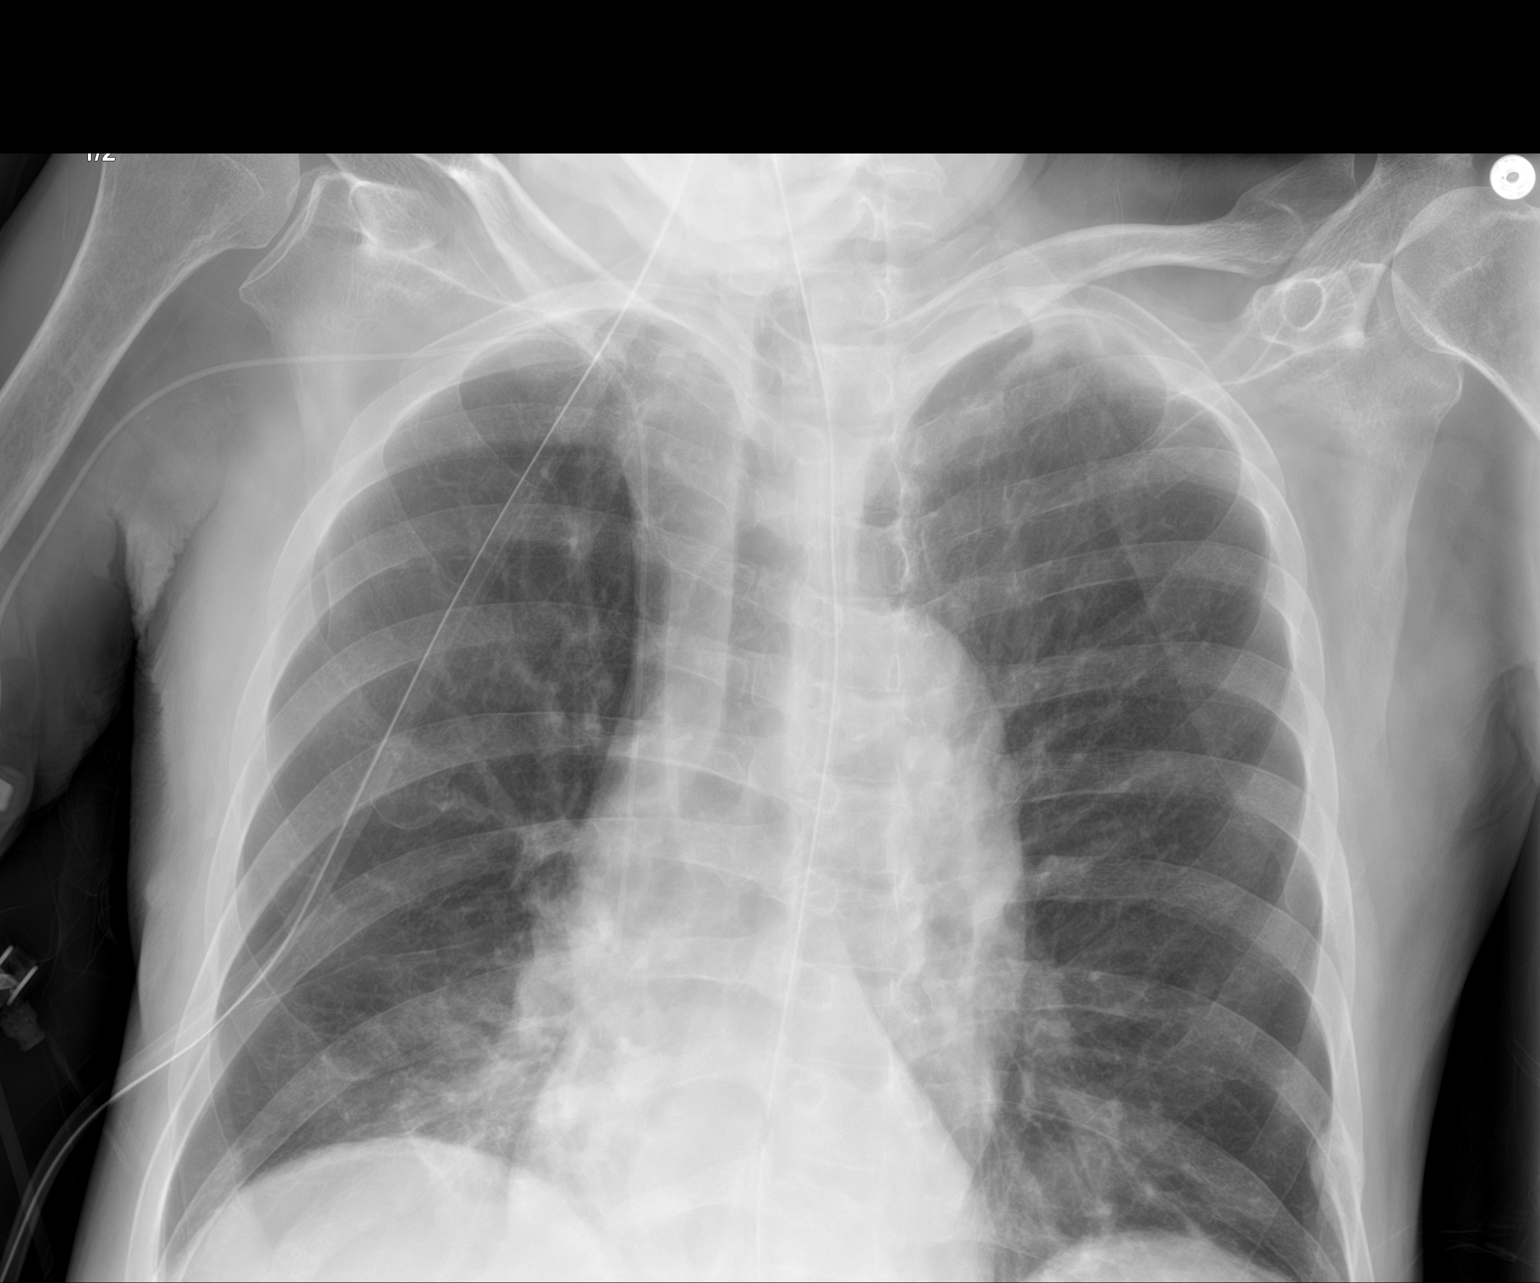

[2 of 2 positions shown; findings below may reference images not displayed]

FINDINGS: Right arm PICC tip at the cavoatrial junction. Gastric tube tip at
the GE junction. Recommend advancing 10 cm.

Mild bibasilar airspace disease right greater than left has
developed since the prior study. Negative for edema or effusion.

Underlying COPD.  Chronic left rib fracture unchanged.
IMPRESSION: PICC tip at the cavoatrial junction

Gastric tube tip at the GE junction.  Recommend advancing tube 10 cm

COPD with progression of bibasilar atelectasis/infiltrate.

## 2020-06-14 IMAGING — DX DG ABD PORTABLE 1V
1 series · 1 of 1 positions shown · non-contrast
Comparison: [DATE]

CLINICAL DATA: NG tube placement

EXAM:
PORTABLE ABDOMEN - 1 VIEW

[abdomen kub]
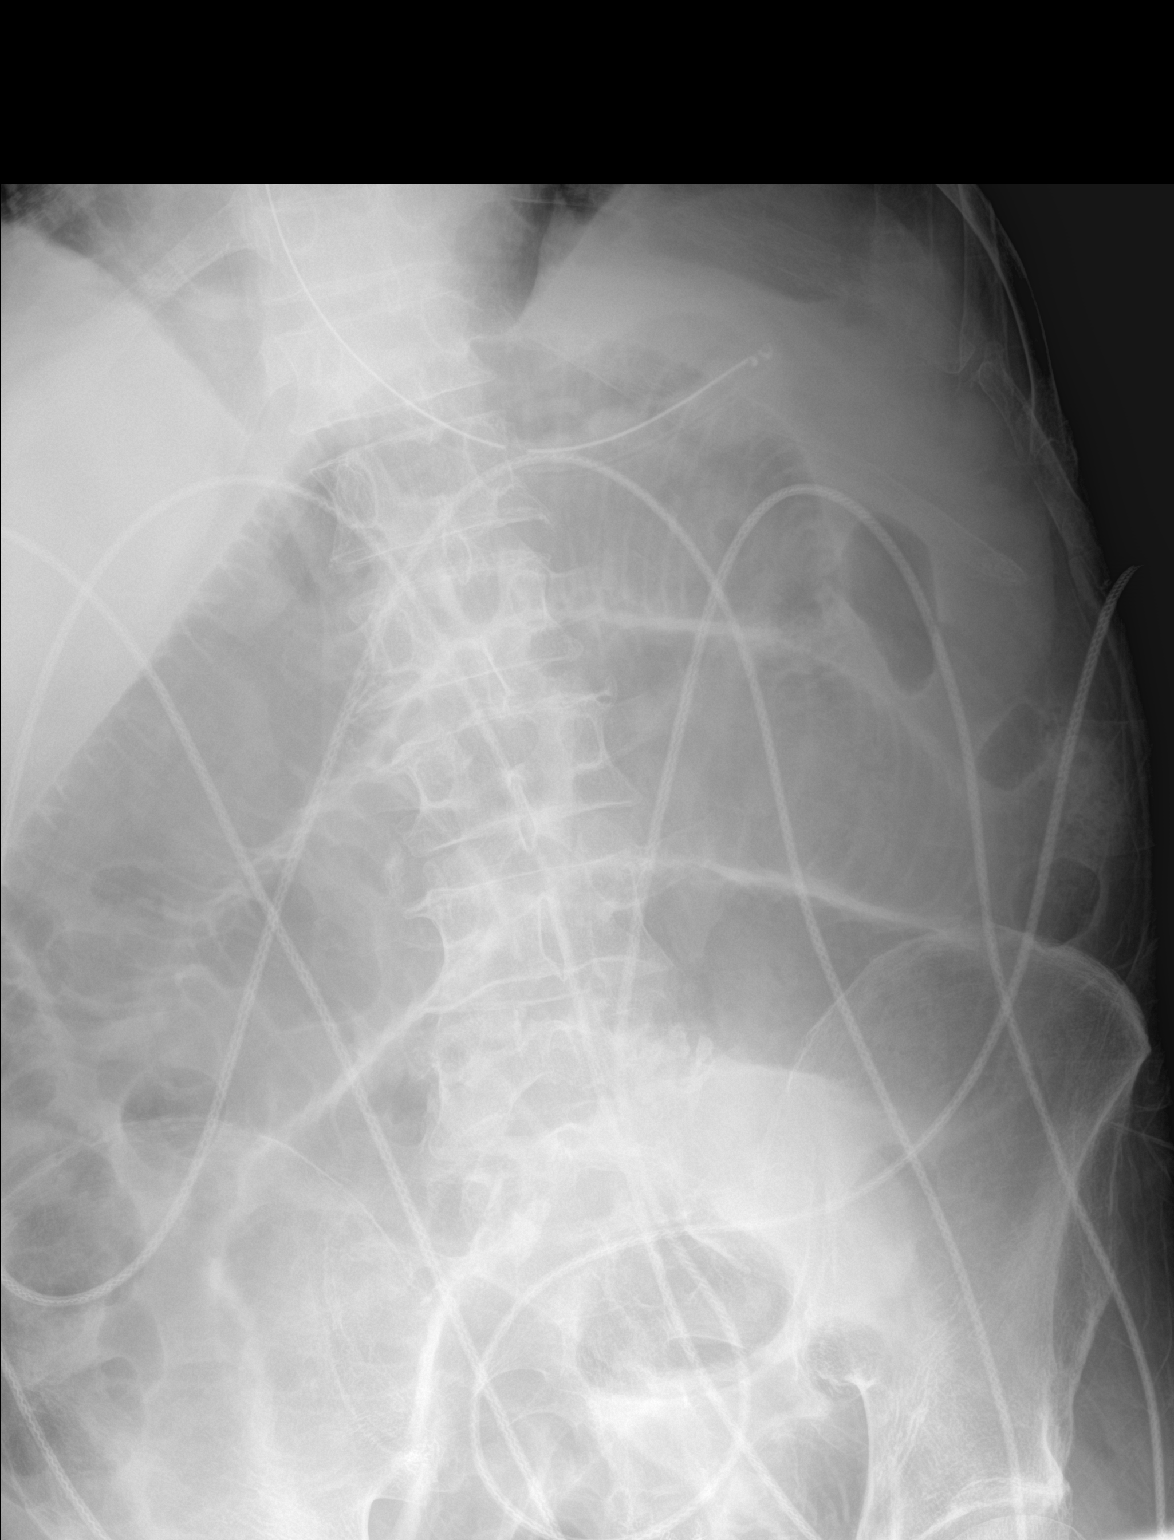

[1 of 1 positions shown; findings below may reference images not displayed]

FINDINGS: Esophageal tube tip overlies gastric fundal region, side-port
slightly beyond GE junction region. Markedly dilated small bowel
measuring up to 5.6 cm consistent with a bowel obstruction.
IMPRESSION: Esophageal tube tip overlies the gastric fundus. Markedly dilated
small bowel consistent with bowel obstruction.

## 2020-06-14 MED ORDER — THIAMINE HCL 100 MG/ML IJ SOLN
INTRAMUSCULAR | Status: AC
  Filled 2020-06-14: qty 1015.2

## 2020-06-14 MED ORDER — FUROSEMIDE 10 MG/ML IJ SOLN
40.0000 mg | Freq: Once | INTRAMUSCULAR | Status: AC
  Administered 2020-06-14: 40 mg via INTRAVENOUS
  Filled 2020-06-14: qty 4

## 2020-06-14 MED ORDER — POTASSIUM CHLORIDE 10 MEQ/100ML IV SOLN
10.0000 meq | INTRAVENOUS | Status: AC
  Administered 2020-06-14 (×4): 10 meq via INTRAVENOUS
  Filled 2020-06-14 (×4): qty 100

## 2020-06-14 NOTE — Progress Notes (Signed)
[facility] Progress Note  15 Days Post-Op  Subjective: CC-  Coughing up phlegm. Denies CP or SOB.  Abdomen feels about the same. Asking for some pain medication. Small amount of stool from colostomy yesterday. NG tube with 925cc out last 24 hours. He did get OOB to chair for a few hours yesterday.  Objective: Vital signs in last 24 hours: Temp:  [98.1 F (36.7 C)-98.6 F (37 C)] 98.4 F (36.9 C) (04/21 0403) Pulse Rate:  [104-115] 107 (04/21 0403) Resp:  [15-20] 18 (04/21 0403) BP: (114-130)/(78-84) 127/81 (04/21 0403) SpO2:  [93 %-96 %] 95 % (04/21 0403) Weight:  [59.5 kg] 59.5 kg (04/21 0404) Last BM Date: 06/13/20  Intake/Output from previous day: 04/20 0701 - 04/21 0700 In: 1302.9 [P.O.:240; I.V.:1062.9] Out: 3900 [Urine:2950; Emesis/NG output:925; Stool:25] Intake/Output this shift: No intake/output data recorded.  PE:  Gen: Alert, NAD Cardio: mild sinus tachyc Pulm: Normal rate and effort, wheezing BWI:OMBT, mild distension,nontender, tinklingBS. Incisions with glue in place and c/d/i. Stoma pink, budded and viable. Colostomy bagempty  Lab Results:  Recent Labs    06/13/20 0415 06/14/20 0416  WBC 9.1 8.2  HGB 8.5* 8.5*  HCT 28.1* 28.2*  PLT 399 378   BMET Recent Labs    06/13/20 0415 06/14/20 0416  NA 134* 136  K 3.3* 3.5  CL 102 102  CO2 28 29  GLUCOSE 146* 131*  BUN 14 11  CREATININE 0.44* 0.35*  CALCIUM 7.4* 7.6*   PT/INR No results for input(s): LABPROT, INR in the last 72 hours. CMP     Component Value Date/Time   NA 136 06/14/2020 0416   K 3.5 06/14/2020 0416   CL 102 06/14/2020 0416   CO2 29 06/14/2020 0416   GLUCOSE 131 (H) 06/14/2020 0416   BUN 11 06/14/2020 0416   CREATININE 0.35 (L) 06/14/2020 0416   CALCIUM 7.6 (L) 06/14/2020 0416   PROT 4.7 (L) 06/14/2020 0416   ALBUMIN 1.4 (L) 06/14/2020 0416   AST 14 (L) 06/14/2020 0416   ALT 12 06/14/2020 0416   ALKPHOS 63 06/14/2020 0416   BILITOT 0.4 06/14/2020  0416   GFRNONAA >60 06/14/2020 0416   GFRAA >60 09/09/2019 0207   Lipase  No results found for: LIPASE     Studies/Results: DG Abd 1 View  Result Date: 06/12/2020 CLINICAL DATA:  NG tube placement EXAM: ABDOMEN - 1 VIEW COMPARISON:  06/05/2020 FINDINGS: NG tube tip is in the proximal stomach. Proximal side port of the NG tube is above the GE junction. Tube could be advanced approximately 5-6 cm to place the proximal side port below the GE junction. Right central line tip overlies the mid SVC level. Gaseous small bowel distension noted upper abdomen. IMPRESSION: NG tube tip is in the proximal stomach with proximal side port above the GE junction. See above. Electronically Signed   By: Misty Stanley M.D.   On: 06/12/2020 14:44    Anti-infectives: Anti-infectives (From admission, onward)   Start     Dose/Rate Route Frequency Ordered Stop   05/30/20 0600  cefoTEtan (CEFOTAN) 2 g in sodium chloride 0.9 % 100 mL IVPB        2 g 200 mL/hr over 30 Minutes Intravenous To Surgery 05/29/20 1430 05/30/20 1745   05/29/20 1400  neomycin (MYCIFRADIN) tablet 1,000 mg       "And" Linked Group Details   1,000 mg Oral 3 times per day 05/29/20 1152 05/29/20 2228   05/29/20 1400  metroNIDAZOLE (FLAGYL) tablet  1,000 mg       "And" Linked Group Details   1,000 mg Oral 3 times per day 05/29/20 1152 05/29/20 2228       Assessment/Plan COPD- PRN nebs Non-bleeding duodenal ulcer  Grade B esophogitis, gastritis, esophageal stenosis s/p dilation 4/2 Grade II internal hemorrhoids  Alcohol use disorder History of tobacco use ABL anemia- hgb stableat8.5. Dr. Burr Medico recommendedstarting him on ferrous sulfate 325 mg twice dailywhen ileus resolves.  Protein calorie malnutrition- Pre-alb 8.0(4/11). Restarted TPN Crackles/wheezing - check CXR  Rectosigmoid colon cancer S/P robotic assisted LAR with end colostomy 05/30/20 Dr. Dema Severin -POD#15 -Path=T3N0 adenocarcinoma.Seen byDr. Allena Napoleon arrange  follow up. - WOC following for colostomy teaching -removedJP4/16 -CT scan 4/11 w/ ileus. No postoperative fluid collection or other complication noted. - CT scan 4/18 with possible SBO and transition in the mid ileum, no evidence of intraabdominal abscess - Continue NPO/NGT to LIWS and await return in bowel function. Encourage mobilization. Continue TPN.  FEN: NPO/NGT to LIWS, TPN VTE: SCDs,Lovenox ID: neomycin/flagyl 4/5, cefotetan 4/6. None currently Foley: removed POD1. Voiding. Follow-Up- Dr. Dema Severin and Dr. Burr Medico    LOS: 20 days    Wellington Hampshire, [facility] 06/14/2020, 10:04 AM Please see Amion for pager number during day hours 7:00am-4:30pm

## 2020-06-14 NOTE — Progress Notes (Signed)
HD#20 Subjective:  Overnight Events: no reported emesis  Patient is seen at bedside. He is comfortable. Reports no emesis overnight. States that his ostomy was emptied this morning. States that he has pain at the ostomy site but improved.  Objective:  Vital signs in last 24 hours: Vitals:   06/13/20 2000 06/13/20 2345 06/14/20 0403 06/14/20 0404  BP: 122/78 114/84 127/81   Pulse: (!) 108 (!) 104 (!) 107   Resp: 15 20 18    Temp: 98.6 F (37 C) 98.4 F (36.9 C) 98.4 F (36.9 C)   TempSrc: Oral Oral Oral   SpO2:  96% 95%   Weight:    59.5 kg  Height:       Supplemental O2: Room Air SpO2: 95 % O2 Flow Rate (L/min): 6 L/min   Physical Exam:  Physical Exam Constitutional:      General: He is not in acute distress. HENT:     Head: Normocephalic.  Eyes:     General:        Right eye: No discharge.        Left eye: No discharge.  Pulmonary:     Effort: Pulmonary effort is normal. No respiratory distress.  Abdominal:     General: Bowel sounds are normal.     Comments: New ostomy bag. No pain to palpation around the site. Improved distention.   Skin:    General: Skin is warm.  Neurological:     Mental Status: He is alert.  Psychiatric:        Mood and Affect: Mood normal.     Filed Weights   06/06/20 0500 06/12/20 0428 06/14/20 0404  Weight: 58 kg 59.6 kg 59.5 kg     Intake/Output Summary (Last 24 hours) at 06/14/2020 0625 Last data filed at 06/14/2020 0558 Gross per 24 hour  Intake 997.11 ml  Output 4250 ml  Net -3252.89 ml   Net IO Since Admission: -14,470.33 mL [06/14/20 0625]  Pertinent Labs: CBC Latest Ref Rng & Units 06/14/2020 06/13/2020 06/12/2020  WBC 4.0 - 10.5 K/uL 8.2 9.1 7.8  Hemoglobin 13.0 - 17.0 g/dL 8.5(L) 8.5(L) 9.7(L)  Hematocrit 39.0 - 52.0 % 28.2(L) 28.1(L) 32.0(L)  Platelets 150 - 400 K/uL 378 399 436(H)    CMP Latest Ref Rng & Units 06/14/2020 06/13/2020 06/12/2020  Glucose 70 - 99 mg/dL 131(H) 146(H) 82  BUN 8 - 23 mg/dL 11 14 11    Creatinine 0.61 - 1.24 mg/dL 0.35(L) 0.44(L) 0.51(L)  Sodium 135 - 145 mmol/L 136 134(L) 133(L)  Potassium 3.5 - 5.1 mmol/L 3.5 3.3(L) 4.6  Chloride 98 - 111 mmol/L 102 102 102  CO2 22 - 32 mmol/L 29 28 23   Calcium 8.9 - 10.3 mg/dL 7.6(L) 7.4(L) 7.9(L)  Total Protein 6.5 - 8.1 g/dL 4.7(L) - -  Total Bilirubin 0.3 - 1.2 mg/dL 0.4 - -  Alkaline Phos 38 - 126 U/L 63 - -  AST 15 - 41 U/L 14(L) - -  ALT 0 - 44 U/L 12 - -    Imaging: No results found.  Assessment/Plan:   Principal Problem:   sigmoid colon cancer Active Problems:   Symptomatic anemia   Iron deficiency anemia due to chronic blood loss   Duodenal ulcer   Gastritis and gastroduodenitis   Gastroesophageal reflux disease with esophagitis without hemorrhage   Esophageal stricture   Protein-calorie malnutrition, severe   Aortic atherosclerosis (HCC)   Ileus following gastrointestinal surgery ([place])   Hyponatremia   Patient Summary: Patient is  65 yo gentlemanwith past medical history of questionable COPD,past left5,6,8ribs fracture who presented to the ED for dizziness, dyspnea with exertion,likely due to symptomatic anemia, found to have duodenal ulcer and acolorectal masss/p resection. Currently managing post-op ileusand SBO.   Colorectal mass(T3B, N0, Mx) Post-op Ileus 900 cc of output from NG tube. Bowel sound present with improved abdomina distention. Good output from ostomy bag per patient. Appears that his SBO and ileus improved significantly. Defer to surgery of when to clamp NG tube. Continue NPO and TPN per surgery.  -NPO, TPN -Painmanagement with IV dilaudid PRN due to NPO status. He got 4 mg of Dilaudid yesterday. His pain and discomfort likely from the NG tube. Will resume scheduled tylenol when he can tolerate po.  -Noadjuvant chemotherapy per oncology.Follow-up with oncology in 1-32months  Non-bleeding duodenal ulcer Grade B esophagitis without bleeding, gastritis s/p bx, moderate  esophageal stenosis s/p dilation Likely in the context of excessive NSAID use, heavy alcohol use and smoking - hold sucrafate for NPO  - continue protonix 40mg IVBID (will need 10w, then reduce to 40mg  daily) - gastric pathologynegative for H. Pylori  Iron deficiencyanemia2/2 GI bleed. Hgb stable - Startpo iron supplementwhen he can tolerate p.o.  Alcoholuse disorder -Holdthiamine and multivitamin per RD  New lung densitieswhich are questioned to be post inflammatory vs atelectasis.  -can repeatimaging outpatient to reevaluate  Diet:TPN IVF:TPN FGB:[id number] Code:Full PT/OT recs:Home Health,walker. TOC recs:home   Dispo: Anticipated discharge toHomein3days pendingPost-op ileus and SBO management.  Gaylan Gerold, DO 06/14/2020, 6:25 AM Pager: [phone]  Please contact the on call pager after 5 pm and on weekends at [phone].

## 2020-06-14 NOTE — Progress Notes (Signed)
RN noticed Radiology via Chest Xray recommended NG tube to be advanced 10cm. RN advanced it 10 cm notified Gaylan Gerold MD and General surgery Broke PA. KUB ordered to confirm placement

## 2020-06-14 NOTE — Progress Notes (Signed)
Physical Therapy Treatment Patient Details Name: Marc Mcdonald MRN: [id number] DOB: 10-07-1955 Today's Date: 06/14/2020    History of Present Illness Pt adm 05/25/20 with dizziness, weakness, and dyspnea. Pt with Hgb of 5.5 and found to have duodenal ulcer and rectal mass. Oncology and surgery involved and staging of rectal CA in process. Pt had surgery on 4/7 to place an End Ostomy. CT 4/19 showed possible SBO with transition in the mid ileum; PMH - ?copd, lt rib fx's 08/2019, etoh    PT Comments    Pt was agreeable to get up and walk the hallway with PT, however, he refused OOB to chair despite reporting he gets up to the chair in the afternoon.  He reports he wants to walk with his son in the hallway again later and RN and PT were encouraging of this. He remains min assist overall with poor endurance.  He is a bit self limiting, but responds well to strong encouragement. PT will continue to follow acutely for safe mobility progression.   Follow Up Recommendations  Home health PT;Supervision for mobility/OOB     Equipment Recommendations  Rolling walker with 5" wheels    Recommendations for Other Services       Precautions / Restrictions Precautions Precautions: Fall Precaution Comments: NG tube in place    Mobility  Bed Mobility Overal bed mobility: Needs Assistance Bed Mobility: Rolling;Sidelying to Sit;Sit to Sidelying Rolling: Supervision Sidelying to sit: Supervision     Sit to sidelying: Min guard General bed mobility comments: Supervision for safety, heavy use of rail and HOB elevated. Min guard assist to complete the lift of bil LEs back into bed (pt got most of the way there)    Transfers Overall transfer level: Needs assistance Equipment used: Rolling walker (2 wheeled) Transfers: Sit to/from Stand Sit to Stand: Min assist         General transfer comment: min assist from low bed, cues for safe hand placement went ignored (pt also HOH, so hard to say if he  just didn't hear me as well).  Ambulation/Gait Ambulation/Gait assistance: Min guard Gait Distance (Feet): 250 Feet Assistive device: Rolling walker (2 wheeled) Gait Pattern/deviations: Step-through pattern;Shuffle;Trunk flexed     General Gait Details: Cues for closer proximity to RW and upright posture.  Pt started rushing and going too fast as we got closer to his bed as he was likely fatigued and legs were starting to give away.   Stairs             Wheelchair Mobility    Modified Rankin (Stroke Patients Only)       Balance Overall balance assessment: Needs assistance Sitting-balance support: Feet supported;No upper extremity supported;Bilateral upper extremity supported Sitting balance-Leahy Scale: Good     Standing balance support: Bilateral upper extremity supported;Single extremity supported;No upper extremity supported Standing balance-Leahy Scale: Fair Standing balance comment: Pt able to static stand momentarily without UE support once standing (needs assist to get to standing), but unable to maintain with longer static standing or dynamic movements.                            Cognition Arousal/Alertness: Awake/alert Behavior During Therapy: Impulsive Overall Cognitive Status: Within Functional Limits for tasks assessed                                 General Comments: a  bit grumpy from prolonged hospitalization.  WIll open up.  Likes to talk about grandson, Lysbeth Galas.  Does well if you meet his level of grumpiness with a bit of grumpiness yourself. Likely would not respond with overly cheerful mood.      Exercises      General Comments General comments (skin integrity, edema, etc.): Pt reports he was going to get his son to walk with him later.  RN and PT encouraged him to do this as the more he can walk the stronger he will get.      Pertinent Vitals/Pain Pain Assessment: No/denies pain    Home Living                       Prior Function            PT Goals (current goals can now be found in the care plan section) Acute Rehab PT Goals Patient Stated Goal: go home Progress towards PT goals: Progressing toward goals    Frequency    Min 3X/week      PT Plan Current plan remains appropriate    Co-evaluation              AM-PAC PT "6 Clicks" Mobility   Outcome Measure  Help needed turning from your back to your side while in a flat bed without using bedrails?: A Little Help needed moving from lying on your back to sitting on the side of a flat bed without using bedrails?: A Little Help needed moving to and from a bed to a chair (including a wheelchair)?: A Little Help needed standing up from a chair using your arms (e.g., wheelchair or bedside chair)?: A Little Help needed to walk in hospital room?: A Little Help needed climbing 3-5 steps with a railing? : A Lot 6 Click Score: 17    End of Session Equipment Utilized During Treatment: Gait belt Activity Tolerance: Patient limited by fatigue Patient left: in bed;with call bell/phone within reach;with nursing/sitter in room Nurse Communication: Mobility status PT Visit Diagnosis: Unsteadiness on feet (R26.81);Muscle weakness (generalized) (M62.81);History of falling (Z91.81)     Time: 8299-3716 PT Time Calculation (min) (ACUTE ONLY): 21 min  Charges:  $Gait Training: 8-22 mins                     Verdene Lennert, PT, DPT  Acute Rehabilitation [phone] pager [phone]) [phone] office

## 2020-06-14 NOTE — Progress Notes (Signed)
PHARMACY - TOTAL PARENTERAL NUTRITION CONSULT NOTE   Indication: Prolonged ileus (14 days post-op)  Patient Measurements: Height: 5\' 11"  (180.3 cm) Weight: 59.5 kg (131 lb 2.8 oz) IBW/kg (Calculated) : 75.3 TPN AdjBW (KG): 56.8 Body mass index is 18.3 kg/m. 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 Last standing weight was 54.9kg on 4/6 indicating about 5kg weight loss over 8 months. Patient had multiple episodes of n/v yesterday but none reported this morning. Has refused NG tube multiple times.   Patient previously on TPN which was weaned 06/10/2020. Pt tolerated full liquid diet x1 day, attempted regular diet x1 meal and had 2 emesis episodes. Pharmacy consulted for TPN.  Glucose / Insulin: No Hx DM, BGs 140-170s this AM  Electrolytes: Na 136, CoCa 9.3, Mg 2.9, K 3.5, Phos 2.9 Renal: Scr<1, CrCl 77.54ml/min  Hepatic: Albumin 1.4 (4/14), Prealbumin 5.6, LFTs/Tbili WNL, Trigs 49 (4/20)  Intake / Output; MIVF: UOP 2.9L, -2.59L over 24 hours on lasix 40 mg  GI Imaging: 4/9 Abd XR: Moderate small bowel and gastric distention likely indicates postoperative ileus. 4/11 CT abd: fluid filled SB consistent w/ postoperative ileus 4/12 Abd XR: NGT placed 4/18 CT abd: SBO in mid-ileum 4/19 Abd XR: NG tube tip proximal stomach placement  GI Surgeries / Procedures:  4/6 robotic assisted LAR with end colostomy   Central access: none - peripheral IV access only PICC orders placed 0900 06/12/2020  TPN start date: 06/05/2020-06/10/2020, restarted 06/12/2020  Nutritional Goals (per RD recommendation on 4/15): kCal: 2000-2200, Protein: 100-115g, Fluid: >/=2 L Goal TPN rate is 90 mL/hr (provides 101 g of protein and 2150 kcals per day)  Current Nutrition:  NPO    Plan: Continue TPN at goal 14mL/hr at 1800 (provides 101gAA, 50g lipids, and 2150 kcals, estimated ~100% of needs)  Electrolytes in  TPN: Mg 10 mEq/L, Ca 63mEq/L; increase Phos to 20 mmol/L, Na 146mEq/L, K 74mEq/L, Cl:Ac 1:1 Lasix 40 mg x 1 dose ordered per MD. Will order KCl 10 mEq x 4 runs  Add standard MVI and trace elements to TPN  Discontinue thiamine order and add thiamine to TPN  Monitor TPN labs on Mon/Thurs   Albertina Parr, PharmD., BCPS, BCCCP Clinical Pharmacist Please refer to [facility] for unit-specific pharmacist

## 2020-06-15 LAB — BASIC METABOLIC PANEL
Anion gap: 5 (ref 5–15)
BUN: 10 mg/dL (ref 8–23)
CO2: 28 mmol/L (ref 22–32)
Calcium: 7.6 mg/dL — ABNORMAL LOW (ref 8.9–10.3)
Chloride: 102 mmol/L (ref 98–111)
Creatinine, Ser: 0.36 mg/dL — ABNORMAL LOW (ref 0.61–1.24)
GFR, Estimated: >60 mL/min (ref >60)
Glucose, Bld: 126 mg/dL — ABNORMAL HIGH (ref 70–99)
Potassium: 3.8 mmol/L (ref 3.5–5.1)
Sodium: 135 mmol/L (ref 135–145)

## 2020-06-15 LAB — PHOSPHORUS: Phosphorus: 3.2 mg/dL (ref 2.5–4.6)

## 2020-06-15 LAB — MAGNESIUM: Magnesium: 1.9 mg/dL (ref 1.7–2.4)

## 2020-06-15 MED ORDER — THIAMINE HCL 100 MG/ML IJ SOLN
INTRAMUSCULAR | Status: AC
  Filled 2020-06-15: qty 1015.2

## 2020-06-15 NOTE — Progress Notes (Signed)
PHARMACY - TOTAL PARENTERAL NUTRITION CONSULT NOTE   Indication: Prolonged ileus (14 days post-op)  Patient Measurements: Height: 5\' 11"  (180.3 cm) Weight: 55 kg (121 lb 4.1 oz) IBW/kg (Calculated) : 75.3 TPN AdjBW (KG): 56.8 Body mass index is 16.91 kg/m. 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 Last standing weight was 54.9kg on 4/6 indicating about 5kg weight loss over 8 months. Patient had multiple episodes of n/v yesterday but none reported this morning. Has refused NG tube multiple times.   Patient previously on TPN which was weaned 06/10/2020. Pt tolerated full liquid diet x1 day, attempted regular diet x1 meal and had 2 emesis episodes. Pharmacy consulted for TPN.  Glucose / Insulin: No Hx DM, BG 126 this AM  Electrolytes: Na 135, CoCa 9.3, Mg 1.9, K 3.8 (post repletion), Phos 3.2 Renal: Scr<1, CrCl 77.48ml/min  Hepatic: Albumin 1.4 (4/14), Prealbumin 5.6, LFTs/Tbili WNL, Trigs 49 (4/20)  Intake / Output; MIVF: UOP 3.8L, -3.8L over 24 hours on lasix 40 mg  GI Imaging: 4/9 Abd XR: Moderate small bowel and gastric distention likely indicates postoperative ileus. 4/11 CT abd: fluid filled SB consistent w/ postoperative ileus 4/12 Abd XR: NGT placed 4/18 CT abd: SBO in mid-ileum 4/19 Abd XR: NG tube tip proximal stomach placement  GI Surgeries / Procedures:  4/6 robotic assisted LAR with end colostomy   Central access: none - peripheral IV access only PICC orders placed 0900 06/12/2020  TPN start date: 06/05/2020-06/10/2020, restarted 06/12/2020  Nutritional Goals (per RD recommendation on 4/15): kCal: 2000-2200, Protein: 100-115g, Fluid: >/=2 L Goal TPN rate is 90 mL/hr (provides 101 g of protein and 2150 kcals per day)  Current Nutrition:  NPO    Plan: Continue TPN at goal 52mL/hr at 1800 (provides 101gAA, 50g lipids, and 2150 kcals, estimated ~100% of needs)   Electrolytes in TPN: Mg 10 mEq/L, Ca 40mEq/L; Phos 20 mmol/L, Na 164mEq/L, K 79mEq/L, Cl:Ac 1:1 Add standard MVI and trace elements to TPN  Adding thiamine to TPN daily  Monitor TPN labs on Mon/Thurs   Albertina Parr, PharmD., BCPS, BCCCP Clinical Pharmacist Please refer to [facility] for unit-specific pharmacist

## 2020-06-15 NOTE — Progress Notes (Signed)
Occupational Therapy Treatment Patient Details Name: Marc Mcdonald MRN: [id number] DOB: 06-Sep-1955 Today's Date: 06/15/2020    History of present illness Pt adm 05/25/20 with dizziness, weakness, and dyspnea. Pt with Hgb of 5.5 and found to have duodenal ulcer and rectal mass. Oncology and surgery involved and staging of rectal CA in process. Pt had surgery on 4/7 to place an End Ostomy. CT 4/19 showed possible SBO with transition in the mid ileum; PMH - ?copd, lt rib fx's 08/2019, etoh   OT comments  Pt limited by fatigue this session. Pt reported walking around the entire hall 2 times today, and reported that he is starting to feel a little stronger. Due to fatigue, pt wanted to focus on self care and education for ADL's with new ostomy bag. Pt appearing anxious about how to manage keeping himself clean and taken care of. OT encouraged pt to complete ADL's like normal and when feeling less fatigued to start completing grooming tasks standing at the sink with a chair behind him in case he fatigues. Pt very agreeable to education. OT will continue following to progress pt closer to independence.    Follow Up Recommendations  Home health OT    Equipment Recommendations  Tub/shower seat    Recommendations for Other Services      Precautions / Restrictions Precautions Precautions: Fall Precaution Comments: NG tube in place       Mobility Bed Mobility Overal bed mobility: Modified Independent             General bed mobility comments: Pt able to come to sitting EOB with no assist.    Transfers                      Balance Overall balance assessment: Needs assistance Sitting-balance support: Feet supported;No upper extremity supported;Bilateral upper extremity supported Sitting balance-Leahy Scale: Good         Standing balance comment: Pt deferred standing this session.                           ADL either performed or assessed with clinical judgement    ADL Overall ADL's : Needs assistance/impaired Eating/Feeding: NPO Eating/Feeding Details (indicate cue type and reason): Pt able to feed himself, just limited due to NG tube at this time. Grooming: Therapist, nutritional;Set up;Sitting Grooming Details (indicate cue type and reason): Pt used a wash cloth and completed grooming EOB                               General ADL Comments: Pt defferred OOB this sesssion due to walking 2 times today and just getting back into bed. Pt wanted education on self care, including ostomy care and completeing ADL's with ostomy.     Vision       Perception     Praxis      Cognition Arousal/Alertness: Awake/alert Behavior During Therapy: WFL for tasks assessed/performed Overall Cognitive Status: Within Functional Limits for tasks assessed                                          Exercises     Shoulder Instructions       General Comments VSS on RA.    Pertinent Vitals/ Pain  Pain Assessment: No/denies pain  Home Living                                          Prior Functioning/Environment              Frequency  Min 2X/week        Progress Toward Goals  OT Goals(current goals can now be found in the care plan section)  Progress towards OT goals: Progressing toward goals  Acute Rehab OT Goals Patient Stated Goal: go home OT Goal Formulation: With patient Time For Goal Achievement: 06/25/20 Potential to Achieve Goals: Good ADL Goals Pt Will Perform Grooming: Independently;standing Additional ADL Goal #1: Pt will perform UE and LE dressing sitting, with mod I, using AE as needed. Additional ADL Goal #2: Pt will report and demonstrate 3 fall prevention techniques.  Plan Discharge plan remains appropriate;Frequency remains appropriate    Co-evaluation                 AM-PAC OT "6 Clicks" Daily Activity     Outcome Measure   Help from another person eating meals?:  Total (NPO at this time) Help from another person taking care of personal grooming?: A Little Help from another person toileting, which includes using toliet, bedpan, or urinal?: A Little Help from another person bathing (including washing, rinsing, drying)?: A Little Help from another person to put on and taking off regular upper body clothing?: None Help from another person to put on and taking off regular lower body clothing?: A Little 6 Click Score: 17    End of Session    OT Visit Diagnosis: Unsteadiness on feet (R26.81);Muscle weakness (generalized) (M62.81)   Activity Tolerance Patient limited by fatigue   Patient Left in bed;with call bell/phone within reach   Nurse Communication Mobility status        Time: 1427-1440 OT Time Calculation (min): 13 min  Charges: OT General Charges $OT Visit: 1 Visit OT Treatments $Self Care/Home Management : 8-22 mins  Damonica Chopra H., OTR/L Acute Rehabilitation  Nakhia Levitan Elane Bing Plume 06/15/2020, 3:31 PM

## 2020-06-15 NOTE — Progress Notes (Signed)
HD#21 Subjective:  Overnight Events: no event  Patient is seen at bedside.  States that he passes gas this morning.  Reports persistent chronic back pain.  States that lidocaine patch helps.  Patient does not like to take pills because he has to chew them up before swallowing.  Objective:  Vital signs in last 24 hours: Vitals:   06/14/20 1348 06/14/20 2006 06/15/20 0400 06/15/20 0401  BP: 130/79 118/78 121/83   Pulse: (!) 101 (!) 110 (!) 105   Resp:  18 15   Temp: 98.5 F (36.9 C) 98.4 F (36.9 C) 98.6 F (37 C)   TempSrc:  Oral Oral   SpO2: 95% 96% 95%   Weight:    55 kg  Height:       Supplemental O2: Room Air SpO2: 95 % O2 Flow Rate (L/min): 6 L/min   Physical Exam:  Physical Exam Constitutional:      General: He is not in acute distress. Pulmonary:     Effort: Pulmonary effort is normal. No respiratory distress.  Abdominal:     Comments: Very minimal output in the ostomy bag.  Abdomen mildly distended.  No pain to palpation.  Skin:    General: Skin is warm.  Neurological:     Mental Status: He is alert.  Psychiatric:        Mood and Affect: Mood normal.     Filed Weights   06/12/20 0428 06/14/20 0404 06/15/20 0401  Weight: 59.6 kg 59.5 kg 55 kg     Intake/Output Summary (Last 24 hours) at 06/15/2020 0725 Last data filed at 06/15/2020 0309 Gross per 24 hour  Intake 965 ml  Output 4835 ml  Net -3870 ml   Net IO Since Admission: -17,277.43 mL [06/15/20 0725]  Pertinent Labs: CBC Latest Ref Rng & Units 06/14/2020 06/13/2020 06/12/2020  WBC 4.0 - 10.5 K/uL 8.2 9.1 7.8  Hemoglobin 13.0 - 17.0 g/dL 8.5(L) 8.5(L) 9.7(L)  Hematocrit 39.0 - 52.0 % 28.2(L) 28.1(L) 32.0(L)  Platelets 150 - 400 K/uL 378 399 436(H)    CMP Latest Ref Rng & Units 06/15/2020 06/14/2020 06/13/2020  Glucose 70 - 99 mg/dL 126(H) 131(H) 146(H)  BUN 8 - 23 mg/dL 10 11 14   Creatinine 0.61 - 1.24 mg/dL 0.36(L) 0.35(L) 0.44(L)  Sodium 135 - 145 mmol/L 135 136 134(L)  Potassium 3.5 -  5.1 mmol/L 3.8 3.5 3.3(L)  Chloride 98 - 111 mmol/L 102 102 102  CO2 22 - 32 mmol/L 28 29 28   Calcium 8.9 - 10.3 mg/dL 7.6(L) 7.6(L) 7.4(L)  Total Protein 6.5 - 8.1 g/dL - 4.7(L) -  Total Bilirubin 0.3 - 1.2 mg/dL - 0.4 -  Alkaline Phos 38 - 126 U/L - 63 -  AST 15 - 41 U/L - 14(L) -  ALT 0 - 44 U/L - 12 -    Imaging: DG CHEST PORT 1 VIEW  Result Date: 06/14/2020 CLINICAL DATA:  Wheezing EXAM: PORTABLE CHEST 1 VIEW COMPARISON:  05/25/2020 FINDINGS: Right arm PICC tip at the cavoatrial junction. Gastric tube tip at the GE junction. Recommend advancing 10 cm. Mild bibasilar airspace disease right greater than left has developed since the prior study. Negative for edema or effusion. Underlying COPD.  Chronic left rib fracture unchanged. IMPRESSION: PICC tip at the cavoatrial junction Gastric tube tip at the GE junction.  Recommend advancing tube 10 cm COPD with progression of bibasilar atelectasis/infiltrate. Electronically Signed   By: Franchot Gallo M.D.   On: 06/14/2020 11:54   DG  Abd Portable 1V  Result Date: 06/14/2020 CLINICAL DATA:  NG tube placement EXAM: PORTABLE ABDOMEN - 1 VIEW COMPARISON:  06/12/2020 FINDINGS: Esophageal tube tip overlies gastric fundal region, side-port slightly beyond GE junction region. Markedly dilated small bowel measuring up to 5.6 cm consistent with a bowel obstruction. IMPRESSION: Esophageal tube tip overlies the gastric fundus. Markedly dilated small bowel consistent with bowel obstruction. Electronically Signed   By: Donavan Foil M.D.   On: 06/14/2020 17:17    Assessment/Plan:   Principal Problem:   sigmoid colon cancer Active Problems:   Symptomatic anemia   Iron deficiency anemia due to chronic blood loss   Duodenal ulcer   Gastritis and gastroduodenitis   Gastroesophageal reflux disease with esophagitis without hemorrhage   Esophageal stricture   Protein-calorie malnutrition, severe   Aortic atherosclerosis (HCC)   Ileus following  gastrointestinal surgery ([place])   Hyponatremia   Patient Summary: Patient is 65 yo gentlemanwith past medical history of questionable COPD,past left5,6,8ribs fracture who presented to the ED for dizziness, dyspnea with exertion,likely due to symptomatic anemia, found to have duodenal ulcer and acolorectal masss/p resection. Currently managing post-op ileusand SBO.  Colorectal mass(T3B, N0, Mx) Post-op Ileus Still has ~1 L of output from NG tube overnight.  Minimal output in the ostomy bag.  KUB done yesterday still showed persistent bowel obstruction. Continue NPO and TPN per surgery.  -NPO, TPN -Painmanagement with IV dilaudid PRN due to NPO status. He got 4 mg of Dilaudid yesterday.  -Noadjuvant chemotherapy per oncology.Follow-up with oncology in 1-12months  Sinus tachycardia Chronic back pain Patient with persistent sinus tachycardia likely 2/2 his chronic back pain.  Pain was previously controlled with lidocaine patch and scheduled Tylenol.  Currently getting IV Dilaudid due to n.p.o. status. -Continue IV Dilaudid for now.  Will resume scheduled Tylenol when ileus/SBO resolved. -Continue lidocaine patch -PT/OT  Non-bleeding duodenal ulcer Grade B esophagitis without bleeding, gastritis s/p bx, moderate esophageal stenosis s/p dilation Likely in the context of excessive NSAID use, heavy alcohol use and smoking - hold sucrafate for NPO  - continue protonix 40mg IVBID (will need 10w, then reduce to 40mg  daily) - gastric pathologynegative for H. Pylori  Iron deficiencyanemia2/2 GI bleed. Hgb stable - Startpo iron supplementwhen he can tolerate p.o.  Alcoholuse disorder -Holdthiamine and multivitamin per RD  New lung densitieswhich are questioned to be post inflammatory vs atelectasis.  -can repeatimaging outpatient to reevaluate  Diet:TPN IVF:TPN IWO:[id number] Code:Full PT/OT recs:Home Health,walker. TOC recs:home  Dispo:  Anticipated discharge toHomein2-3days pendingPost-op ileus and SBO management.  Gaylan Gerold, DO 06/15/2020, 7:25 AM Pager: [phone]  Please contact the on call pager after 5 pm and on weekends at [phone].

## 2020-06-15 NOTE — Progress Notes (Signed)
[facility] Progress Note  16 Days Post-Op  Subjective: CC-  Just ambulated in the hall. No new complaints. Still coughing up some phlegm but no longer wheezing. Duoneb last night and this morning helped. Abdomen still sore but no severe pain. Minimal colostomy output. NG tube with 1L out last 24 hours.  Objective: Vital signs in last 24 hours: Temp:  [98.4 F (36.9 C)-98.6 F (37 C)] 98.6 F (37 C) (04/22 0400) Pulse Rate:  [101-110] 105 (04/22 0400) Resp:  [15-18] 15 (04/22 0400) BP: (118-130)/(78-83) 121/83 (04/22 0400) SpO2:  [95 %-96 %] 95 % (04/22 0400) Weight:  [55 kg] 55 kg (04/22 0401) Last BM Date: 06/13/20  Intake/Output from previous day: 04/21 0701 - 04/22 0700 In: 965 [P.O.:965] Out: 4835 [Urine:3825; Emesis/NG output:1000; Stool:10] Intake/Output this shift: No intake/output data recorded.  PE: Gen: Alert, NAD Cardio: mild sinus tachyc Pulm: Normal rate and effort on room air, some rhonchi but no longer wheezing YNW:GNFA, mild distension,nontender, hypoactive BS. Incisions c/d/i. Stoma pink, budded and viable. Colostomy bagempty  Lab Results:  Recent Labs    06/13/20 0415 06/14/20 0416  WBC 9.1 8.2  HGB 8.5* 8.5*  HCT 28.1* 28.2*  PLT 399 378   BMET Recent Labs    06/14/20 0416 06/15/20 0500  NA 136 135  K 3.5 3.8  CL 102 102  CO2 29 28  GLUCOSE 131* 126*  BUN 11 10  CREATININE 0.35* 0.36*  CALCIUM 7.6* 7.6*   PT/INR No results for input(s): LABPROT, INR in the last 72 hours. CMP     Component Value Date/Time   NA 135 06/15/2020 0500   K 3.8 06/15/2020 0500   CL 102 06/15/2020 0500   CO2 28 06/15/2020 0500   GLUCOSE 126 (H) 06/15/2020 0500   BUN 10 06/15/2020 0500   CREATININE 0.36 (L) 06/15/2020 0500   CALCIUM 7.6 (L) 06/15/2020 0500   PROT 4.7 (L) 06/14/2020 0416   ALBUMIN 1.4 (L) 06/14/2020 0416   AST 14 (L) 06/14/2020 0416   ALT 12 06/14/2020 0416   ALKPHOS 63 06/14/2020 0416   BILITOT 0.4 06/14/2020 0416    GFRNONAA >60 06/15/2020 0500   GFRAA >60 09/09/2019 0207   Lipase  No results found for: LIPASE     Studies/Results: DG CHEST PORT 1 VIEW  Result Date: 06/14/2020 CLINICAL DATA:  Wheezing EXAM: PORTABLE CHEST 1 VIEW COMPARISON:  05/25/2020 FINDINGS: Right arm PICC tip at the cavoatrial junction. Gastric tube tip at the GE junction. Recommend advancing 10 cm. Mild bibasilar airspace disease right greater than left has developed since the prior study. Negative for edema or effusion. Underlying COPD.  Chronic left rib fracture unchanged. IMPRESSION: PICC tip at the cavoatrial junction Gastric tube tip at the GE junction.  Recommend advancing tube 10 cm COPD with progression of bibasilar atelectasis/infiltrate. Electronically Signed   By: Franchot Gallo M.D.   On: 06/14/2020 11:54   DG Abd Portable 1V  Result Date: 06/14/2020 CLINICAL DATA:  NG tube placement EXAM: PORTABLE ABDOMEN - 1 VIEW COMPARISON:  06/12/2020 FINDINGS: Esophageal tube tip overlies gastric fundal region, side-port slightly beyond GE junction region. Markedly dilated small bowel measuring up to 5.6 cm consistent with a bowel obstruction. IMPRESSION: Esophageal tube tip overlies the gastric fundus. Markedly dilated small bowel consistent with bowel obstruction. Electronically Signed   By: Donavan Foil M.D.   On: 06/14/2020 17:17    Anti-infectives: Anti-infectives (From admission, onward)   Start     Dose/Rate Route  Frequency Ordered Stop   05/30/20 0600  cefoTEtan (CEFOTAN) 2 g in sodium chloride 0.9 % 100 mL IVPB        2 g 200 mL/hr over 30 Minutes Intravenous To Surgery 05/29/20 1430 05/30/20 1745   05/29/20 1400  neomycin (MYCIFRADIN) tablet 1,000 mg       "And" Linked Group Details   1,000 mg Oral 3 times per day 05/29/20 1152 05/29/20 2228   05/29/20 1400  metroNIDAZOLE (FLAGYL) tablet 1,000 mg       "And" Linked Group Details   1,000 mg Oral 3 times per day 05/29/20 1152 05/29/20 2228        Assessment/Plan COPD- PRN nebs Non-bleeding duodenal ulcer  Grade B esophogitis, gastritis, esophageal stenosis s/p dilation 4/2 Grade II internal hemorrhoids  Alcohol use disorder History of tobacco use ABL anemia- hgb stableat8.5 (4/21). Dr. Burr Medico recommendedstarting him on ferrous sulfate 325 mg twice dailywhen ileus resolves.  Protein calorie malnutrition- Pre-alb 8.0(4/11). Restarted TPN Crackles/wheezing - improving, continue duonebs PRN. CXR with atelectasis possible infiltrate but he is afebrile  Rectosigmoid colon cancer S/P robotic assisted LAR with end colostomy 05/30/20 Dr. Dema Severin -POD#16 -Path=T3N0 adenocarcinoma.Seen byDr. Allena Napoleon arrange follow up. - WOC following for colostomy teaching -removedJP4/16 -CT scan 4/11 w/ ileus. No postoperative fluid collection or other complication noted. - CT scan 4/18 with possible SBO and transition in the mid ileum, no evidence of intraabdominal abscess - Continue NPO/NGT to LIWS and await return in bowel function. Encourage mobilization. Continue TPN.  FEN: NPO/NGT to LIWS, TPN VTE: SCDs,Lovenox ID: neomycin/flagyl 4/5, cefotetan 4/6. None currently Foley: removed POD1. Voiding. Follow-Up- Dr. Dema Severin and Dr. Burr Medico    LOS: 21 days    Marc Mcdonald, [facility] 06/15/2020, 9:26 AM Please see Amion for pager number during day hours 7:00am-4:30pm

## 2020-06-16 LAB — BASIC METABOLIC PANEL
Anion gap: 4 — ABNORMAL LOW (ref 5–15)
BUN: 10 mg/dL (ref 8–23)
CO2: 26 mmol/L (ref 22–32)
Calcium: 7.6 mg/dL — ABNORMAL LOW (ref 8.9–10.3)
Chloride: 106 mmol/L (ref 98–111)
Creatinine, Ser: 0.36 mg/dL — ABNORMAL LOW (ref 0.61–1.24)
GFR, Estimated: >60 mL/min (ref >60)
Glucose, Bld: 110 mg/dL — ABNORMAL HIGH (ref 70–99)
Potassium: 4.2 mmol/L (ref 3.5–5.1)
Sodium: 136 mmol/L (ref 135–145)

## 2020-06-16 LAB — MAGNESIUM: Magnesium: 1.9 mg/dL (ref 1.7–2.4)

## 2020-06-16 MED ORDER — ACETAMINOPHEN 325 MG PO TABS
650.0000 mg | ORAL_TABLET | Freq: Four times a day (QID) | ORAL | Status: DC
  Administered 2020-06-16 – 2020-06-21 (×19): 650 mg via ORAL
  Filled 2020-06-16 (×19): qty 2

## 2020-06-16 MED ORDER — TRAVASOL 10 % IV SOLN
INTRAVENOUS | Status: AC
  Filled 2020-06-16: qty 1015.2

## 2020-06-16 MED ORDER — OXYCODONE HCL 5 MG PO TABS: 5.0000 mg | ORAL_TABLET | ORAL | Status: DC | PRN

## 2020-06-16 MED ORDER — HYDROMORPHONE HCL 1 MG/ML IJ SOLN
0.5000 mg | INTRAMUSCULAR | Status: DC | PRN
  Administered 2020-06-16 – 2020-06-19 (×8): 0.5 mg via INTRAVENOUS
  Filled 2020-06-16 (×8): qty 1

## 2020-06-16 MED ORDER — HYDROMORPHONE HCL 1 MG/ML IJ SOLN: 0.5000 mg | INTRAMUSCULAR | Status: DC | PRN

## 2020-06-16 MED ORDER — OXYCODONE HCL 5 MG PO TABS
5.0000 mg | ORAL_TABLET | ORAL | Status: DC | PRN
  Administered 2020-06-19 – 2020-06-21 (×8): 5 mg via ORAL
  Filled 2020-06-16 (×9): qty 1

## 2020-06-16 NOTE — Progress Notes (Signed)
[facility] Progress Note  17 Days Post-Op  Subjective: CC-  States that he had some burning on his left side last night. Worse with coughing. Pain medication helped and he no longer has this pain this morning. Colostomy with 200cc stool out last 24 hours. NG tube with 1150cc out. Ambulated in the hall yesterday and sat in the chair for several hours.  Objective: Vital signs in last 24 hours: Temp:  [98.4 F (36.9 C)-98.5 F (36.9 C)] 98.4 F (36.9 C) (04/23 0437) Pulse Rate:  [101-103] 101 (04/23 0437) Resp:  [16-18] 18 (04/23 0437) BP: (119-130)/(74-85) 119/85 (04/23 0437) SpO2:  [95 %-98 %] 97 % (04/23 0437) Last BM Date: 06/13/20  Intake/Output from previous day: 04/22 0701 - 04/23 0700 In: 1374.4 [P.O.:240; I.V.:1134.4] Out: 2100 [Urine:750; Emesis/NG output:1150; Stool:200] Intake/Output this shift: Total I/O In: -  Out: 200 [Urine:200]  PE: Gen: Alert, NAD Cardio: RRR Pulm: Normal rate and effort on room air, some rhonchi but no wheezing RKY:HCWC, minimal distension,nontender, few BS heard. Incisions c/d/i. Stoma pink, budded and viable. Colostomy bag liquid stool in pouch  Lab Results:  Recent Labs    06/14/20 0416  WBC 8.2  HGB 8.5*  HCT 28.2*  PLT 378   BMET Recent Labs    06/15/20 0500 06/16/20 0319  NA 135 136  K 3.8 4.2  CL 102 106  CO2 28 26  GLUCOSE 126* 110*  BUN 10 10  CREATININE 0.36* 0.36*  CALCIUM 7.6* 7.6*   PT/INR No results for input(s): LABPROT, INR in the last 72 hours. CMP     Component Value Date/Time   NA 136 06/16/2020 0319   K 4.2 06/16/2020 0319   CL 106 06/16/2020 0319   CO2 26 06/16/2020 0319   GLUCOSE 110 (H) 06/16/2020 0319   BUN 10 06/16/2020 0319   CREATININE 0.36 (L) 06/16/2020 0319   CALCIUM 7.6 (L) 06/16/2020 0319   PROT 4.7 (L) 06/14/2020 0416   ALBUMIN 1.4 (L) 06/14/2020 0416   AST 14 (L) 06/14/2020 0416   ALT 12 06/14/2020 0416   ALKPHOS 63 06/14/2020 0416   BILITOT 0.4 06/14/2020  0416   GFRNONAA >60 06/16/2020 0319   GFRAA >60 09/09/2019 0207   Lipase  No results found for: LIPASE     Studies/Results: DG CHEST PORT 1 VIEW  Result Date: 06/14/2020 CLINICAL DATA:  Wheezing EXAM: PORTABLE CHEST 1 VIEW COMPARISON:  05/25/2020 FINDINGS: Right arm PICC tip at the cavoatrial junction. Gastric tube tip at the GE junction. Recommend advancing 10 cm. Mild bibasilar airspace disease right greater than left has developed since the prior study. Negative for edema or effusion. Underlying COPD.  Chronic left rib fracture unchanged. IMPRESSION: PICC tip at the cavoatrial junction Gastric tube tip at the GE junction.  Recommend advancing tube 10 cm COPD with progression of bibasilar atelectasis/infiltrate. Electronically Signed   By: Franchot Gallo M.D.   On: 06/14/2020 11:54   DG Abd Portable 1V  Result Date: 06/14/2020 CLINICAL DATA:  NG tube placement EXAM: PORTABLE ABDOMEN - 1 VIEW COMPARISON:  06/12/2020 FINDINGS: Esophageal tube tip overlies gastric fundal region, side-port slightly beyond GE junction region. Markedly dilated small bowel measuring up to 5.6 cm consistent with a bowel obstruction. IMPRESSION: Esophageal tube tip overlies the gastric fundus. Markedly dilated small bowel consistent with bowel obstruction. Electronically Signed   By: Donavan Foil M.D.   On: 06/14/2020 17:17    Anti-infectives: Anti-infectives (From admission, onward)   Start  Dose/Rate Route Frequency Ordered Stop   05/30/20 0600  cefoTEtan (CEFOTAN) 2 g in sodium chloride 0.9 % 100 mL IVPB        2 g 200 mL/hr over 30 Minutes Intravenous To Surgery 05/29/20 1430 05/30/20 1745   05/29/20 1400  neomycin (MYCIFRADIN) tablet 1,000 mg       "And" Linked Group Details   1,000 mg Oral 3 times per day 05/29/20 1152 05/29/20 2228   05/29/20 1400  metroNIDAZOLE (FLAGYL) tablet 1,000 mg       "And" Linked Group Details   1,000 mg Oral 3 times per day 05/29/20 1152 05/29/20 2228        Assessment/Plan COPD- PRN nebs Non-bleeding duodenal ulcer  Grade B esophogitis, gastritis, esophageal stenosis s/p dilation 4/2 Grade II internal hemorrhoids  Alcohol use disorder History of tobacco use ABL anemia- hgb stableat8.5 (4/21). Dr. Burr Medico recommendedstarting him on ferrous sulfate 325 mg twice dailywhen ileus resolves.  Protein calorie malnutrition- Pre-alb 8.0(4/11).Restarted TPN Crackles/wheezing - improving, continue duonebs PRN. CXR 4/21 with atelectasis possible infiltrate, afebrile  Rectosigmoid colon cancer S/P robotic assisted LAR with end colostomy 05/30/20 Dr. Dema Severin -POD#17 -Path=T3N0 adenocarcinoma.Seen byDr. Allena Napoleon arrange follow up. - WOC following for colostomy teaching -removedJP4/16 -CT scan 4/11 w/ ileus. No postoperative fluid collection or other complication noted. - CT scan 4/18 with possible SBO and transition in the mid ileum, no evidence of intraabdominal abscess - Will trial intermittent clamping trials today of NG tube: 4 hours clamped, 4 hours unclamped, 4 hours clamped... and allow sips of water. Continue to encourage mobilization. Continue TPN.  [id number] NG tube clamping trials, TPN VTE: SCDs,Lovenox ID: neomycin/flagyl 4/5, cefotetan 4/6. None currently Foley: removed POD1. Voiding. Follow-Up- Dr. Dema Severin and Dr. Burr Medico     LOS: 1 days    Wellington Hampshire, [facility] 06/16/2020, 9:31 AM Please see Amion for pager number during day hours 7:00am-4:30pm

## 2020-06-16 NOTE — Progress Notes (Signed)
  Date: 06/16/2020  Patient name: Marc Mcdonald  Medical record number: [id number]  Date of birth: 07/31/1955   This patient's plan of care was discussed with the house staff. Please see Dr. Chase Picket note for complete details. I concur with their findings.   Sid Falcon, MD 06/16/2020, 5:18 PM

## 2020-06-16 NOTE — Progress Notes (Signed)
    HD#22 Subjective:  No significant overnight events. No complaints this morning  Objective:  Vital signs in last 24 hours: Vitals:   06/15/20 0401 06/15/20 1351 06/15/20 2000 06/16/20 0437  BP:  130/79 124/74 119/85  Pulse:  (!) 103 (!) 102 (!) 101  Resp:  16 17 18   Temp:  98.5 F (36.9 C) 98.4 F (36.9 C) 98.4 F (36.9 C)  TempSrc:      SpO2:  95% 98% 97%  Weight: 55 kg     Height:       General: chronically ill appearing GI: abd distension improved. NG placed to suction. Small volume ostomy output in bag. No abdominal tenderness. Minimal bs  Assessment/Plan:   Principal Problem:   sigmoid colon cancer Active Problems:   Symptomatic anemia   Iron deficiency anemia due to chronic blood loss   Duodenal ulcer   Gastritis and gastroduodenitis   Gastroesophageal reflux disease with esophagitis without hemorrhage   Esophageal stricture   Protein-calorie malnutrition, severe   Aortic atherosclerosis (HCC)   Ileus following gastrointestinal surgery ([place])   Hyponatremia   Patient Summary: Patient is 65 yo gentlemanwith past medical history of questionable COPD,past left5,6,8ribs fracture who presented to the ED for dizziness, dyspnea with exertion,likely due to symptomatic anemia, found to have duodenal ulcer and acolorectal masss/p resection. Currently managing post-op ileusand SBO.  Colorectal mass(T3B, N0, Mx) Post-op Ileus: management per surgery -trial clamping NG today -NPO, TPN -Painmanagement with IV dilaudid PRN due to NPO status. He got 4 mg of Dilaudid yesterday.  -Noadjuvant chemotherapy per oncology.Follow-up with oncology in 1-46months   #Non-bleeding duodenal ulcer #Grade B esophagitis without bleeding, gastritis s/p bx, moderate esophageal stenosis s/p dilation - hold sucrafate for NPO  - continue protonix 40mg IVBID (will need 10w, then reduce to 40mg  daily) - gastric pathologynegative for H. Pylori  #Iron  deficiencyanemia2/2 GI bleed. Hgb stable - Startpo iron supplementwhen he can tolerate p.o.  #Alcoholuse disorder -Holdthiamine and multivitamin per RD  #New lung densitieswhich are questioned to be post inflammatory vs atelectasis.  -can repeatimaging outpatient to reevaluate  Diet:TPN IVF:TPN NWG:[id number] Code:Full PT/OT recs:Home Health,walker. TOC recs:home  Dispo: Anticipated discharge toHomein2-3days pendingPost-op ileus and SBO management.  Mitzi Hansen, MD Internal Medicine Resident PGY-2 [name] Pager: [phone] 06/16/2020 11:24 AM     Please contact the on call pager after 5 pm and on weekends at [phone].

## 2020-06-16 NOTE — Plan of Care (Signed)

## 2020-06-16 NOTE — Progress Notes (Signed)
PHARMACY - TOTAL PARENTERAL NUTRITION CONSULT NOTE   Indication: Prolonged ileus (14 days post-op)  Patient Measurements: Height: 5\' 11"  (180.3 cm) Weight: 55 kg (121 lb 4.1 oz) IBW/kg (Calculated) : 75.3 TPN AdjBW (KG): 56.8 Body mass index is 16.91 kg/m. 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 Last standing weight was 54.9kg on 4/6 indicating about 5kg weight loss over 8 months. Patient had multiple episodes of n/v yesterday but none reported this morning. Has refused NG tube multiple times.   Patient previously on TPN which was weaned 06/10/2020. Pt tolerated full liquid diet x1 day, attempted regular diet x1 meal and had 2 emesis episodes. Pharmacy consulted for TPN.  Glucose / Insulin: No Hx DM, BG 110 this AM  Electrolytes: Na 136, CoCa 9.7, Mg 1.9, K 4.2, Phos 3.2 (4/22) Renal: Scr 0.36 (BL 0.3-0.5), BUN wnl Hepatic: Albumin 1.4 (4/14), Prealbumin 5.6 (4/20), LFTs/Tbili WNL, Trigs 49 (4/20)  Intake / Output; MIVF: UOP 0.6 ml/kg/hr, colostomy output/24h: 200cc, NG output/24h: 1150 cc - to attempt intermittent clamping trials  GI Imaging: 4/9 Abd XR: Moderate small bowel and gastric distention likely indicates postoperative ileus. 4/11 CT abd: fluid filled SB consistent w/ postoperative ileus 4/12 Abd XR: NGT placed 4/18 CT abd: SBO in mid-ileum 4/19 Abd XR: NG tube tip proximal stomach placement 4/21 Abd XR: Markedly dilated small bowel consistent with bowel obstruction.  GI Surgeries / Procedures:  4/6 robotic assisted LAR with end colostomy   Central access:  Double lumen R-brachial PICC placed 4/19  TPN start date: 4/12-4/17; restart 4/19  Nutritional Goals (per RD recommendation on 4/20): kCal: 2000-2200, Protein: 100-115g, Fluid: >/=2 L Goal TPN rate is 90 mL/hr (provides 101 g of protein and 2150 kcals per day)  Current Nutrition:  NPO   Plan: Continue TPN  at goal 49mL/hr at 1800 (provides 101gAA, 50g lipids, and 2150 kcals, estimated ~100% of needs)  Electrolytes in TPN: incr Mg to 12 mEq/L, Ca 42mEq/L; Phos 20 mmol/L, Na 11mEq/L, K 37mEq/L, Cl:Ac 1:1 Add standard MVI and trace elements to TPN  Adding thiamine to TPN daily  Monitor TPN labs on Mon/Thurs   Thank you for allowing pharmacy to be a part of this patient's care.  Alycia Rossetti, PharmD, BCPS Clinical Pharmacist Clinical phone for 06/16/2020: [phone] 06/16/2020 10:15 AM   **Pharmacist phone directory can now be found on [email or web address] ([email or web address]).  Listed under [place].

## 2020-06-16 NOTE — Plan of Care (Signed)

## 2020-06-17 MED ORDER — TRAVASOL 10 % IV SOLN
INTRAVENOUS | Status: AC
  Filled 2020-06-17: qty 1015.2

## 2020-06-17 NOTE — Progress Notes (Signed)
    HD#23 Subjective:  No significant overnight events. Wants to get the NG tube out. Discussed reasoning for it. Seemed to tolerate the intermittent NGT clamping ok yesterday.  Objective:  Vital signs in last 24 hours: Vitals:   06/16/20 0437 06/16/20 1422 06/16/20 2052 06/17/20 0352  BP: 119/85 125/82 119/80 122/78  Pulse: (!) 101 (!) 102 (!) 105 (!) 103  Resp: 18 18 17 18   Temp: 98.4 F (36.9 C) 98.3 F (36.8 C) 98.1 F (36.7 C) 98.2 F (36.8 C)  TempSrc:  Oral Oral Oral  SpO2: 97% 95% 96% 96%  Weight:      Height:       General: chronically ill appearing  Cardiac: RRR GI: abd soft, not distended, non-tender. bs active. NGT placed on wall suction. Small volume stool in ostomy bag. Ostomy pink  Assessment/Plan:   Principal Problem:   sigmoid colon cancer Active Problems:   Symptomatic anemia   Iron deficiency anemia due to chronic blood loss   Duodenal ulcer   Gastritis and gastroduodenitis   Gastroesophageal reflux disease with esophagitis without hemorrhage   Esophageal stricture   Protein-calorie malnutrition, severe   Aortic atherosclerosis (HCC)   Ileus following gastrointestinal surgery ([place])   Hyponatremia   Patient Summary: Patient is 65 yo gentlemanwith past medical history of questionable COPD,past left5,6,8ribs fracture who presented to the ED for dizziness, dyspnea with exertion,likely due to symptomatic anemia, found to have duodenal ulcer and acolorectal masss/p resection. Currently managing post-op ileusand SBO.  Colorectal mass(T3B, N0, Mx) Post-op Ileus: management per surgery - trial clamping NG today with plans to remove tomorrow if doing well -NPO, TPN -Painmanagement with IV dilaudid PRN due to NPO status. He got 4 mg of Dilaudid yesterday.  -Noadjuvant chemotherapy per oncology.Follow-up with oncology in 1-45months   #Non-bleeding duodenal ulcer #Grade B esophagitis without bleeding, gastritis s/p bx, moderate  esophageal stenosis s/p dilation - hold sucrafate for NPO  - continue protonix 40mg IVBID (will need 10w, then reduce to 40mg  daily) - gastric pathologynegative for H. Pylori  #Iron deficiencyanemia2/2 GI bleed. Hgb stable - Startpo iron supplementwhen he can tolerate p.o.  #Alcoholuse disorder -Holdthiamine and multivitamin per RD  #New lung densitieswhich are questioned to be post inflammatory vs atelectasis.  -can repeatimaging outpatient to reevaluate  Diet:TPN IVF:TPN FOY:[id number] Code:Full PT/OT recs:Home Health,walker. TOC recs:home  Dispo: Anticipated discharge toHomein2-3days pendingPost-op ileus resolution  Mitzi Hansen, MD Internal Medicine Resident PGY-2 [name] Pager: [phone] 06/17/2020 10:35 AM     Please contact the on call pager after 5 pm and on weekends at [phone].

## 2020-06-17 NOTE — Progress Notes (Signed)
NG clamped at 1015.

## 2020-06-17 NOTE — Progress Notes (Signed)
PHARMACY - TOTAL PARENTERAL NUTRITION CONSULT NOTE   Indication: Prolonged ileus (14 days post-op)  Patient Measurements: Height: 5\' 11"  (180.3 cm) Weight: 55 kg (121 lb 4.1 oz) IBW/kg (Calculated) : 75.3 TPN AdjBW (KG): 56.8 Body mass index is 16.91 kg/m. 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 Last standing weight was 54.9kg on 4/6 indicating about 5kg weight loss over 8 months. Patient had multiple episodes of n/v yesterday but none reported this morning. Has refused NG tube multiple times.   Patient previously on TPN which was weaned 06/10/2020. Pt tolerated full liquid diet x1 day, attempted regular diet x1 meal and had 2 emesis episodes. Pharmacy consulted for TPN.  Glucose / Insulin: No Hx DM, no CBG check in the past 24h - has been stable Electrolytes: From 4/23: Na 136, CoCa 9.7, Mg 1.9, K 4.2, Phos 3.2 (4/22) Renal: Scr 0.36 (BL 0.3-0.5), BUN wnl Hepatic: Albumin 1.4 (4/14), Prealbumin 5.6 (4/20), LFTs/Tbili WNL, Trigs 49 (4/20)  Intake / Output; MIVF: UOP 0.6 ml/kg/hr, colostomy output not charted on 4/23,  NG output/24h: 850 cc - intermittent clamping trials on 4/23 - tolerated, to leave clamped 4/24 and if tolerates to remove on 4/25 GI Imaging: 4/9 Abd XR: Moderate small bowel and gastric distention likely indicates postoperative ileus. 4/11 CT abd: fluid filled SB consistent w/ postoperative ileus 4/12 Abd XR: NGT placed 4/18 CT abd: SBO in mid-ileum 4/19 Abd XR: NG tube tip proximal stomach placement 4/21 Abd XR: Markedly dilated small bowel consistent with bowel obstruction.  GI Surgeries / Procedures:  4/6 robotic assisted LAR with end colostomy   Central access:  Double lumen R-brachial PICC placed 4/19  TPN start date: 4/12-4/17; restart 4/19  Nutritional Goals (per RD recommendation on 4/20): kCal: 2000-2200, Protein: 100-115g, Fluid: >/=2 L Goal TPN  rate is 90 mL/hr (provides 101 g of protein and 2150 kcals per day)  Current Nutrition:  NPO   Plan: Continue TPN at goal 31mL/hr at 1800 (provides 101gAA, 50g lipids, and 2150 kcals, estimated ~100% of needs)  Electrolytes in TPN: cont Mg 12 mEq/L, Ca 60mEq/L; Phos 20 mmol/L, Na 178mEq/L, K 42mEq/L, Cl:Ac 1:1 Add standard MVI and trace elements to TPN  Adding thiamine to TPN daily  Monitor TPN labs on Mon/Thurs   Thank you for allowing pharmacy to be a part of this patient's care.  Alycia Rossetti, PharmD, BCPS Clinical Pharmacist Clinical phone for 06/17/2020: [phone] 06/17/2020 8:59 AM   **Pharmacist phone directory can now be found on [email or web address] ([email or web address]).  Listed under [place].

## 2020-06-17 NOTE — Plan of Care (Signed)
  Problem: Education: Goal: Knowledge of General Education information will improve Description: Including pain rating scale, medication(s)/side effects and non-pharmacologic comfort measures 06/17/2020 1032 by Bethann Punches, RN Outcome: Progressing 06/17/2020 1032 by Bethann Punches, RN Outcome: Progressing   Problem: Health Behavior/Discharge Planning: Goal: Ability to manage health-related needs will improve 06/17/2020 1032 by Bethann Punches, RN Outcome: Progressing 06/17/2020 1032 by Bethann Punches, RN Outcome: Progressing   Problem: Clinical Measurements: Goal: Ability to maintain clinical measurements within normal limits will improve 06/17/2020 1032 by Bethann Punches, RN Outcome: Progressing 06/17/2020 1032 by Bethann Punches, RN Outcome: Progressing Goal: Will remain free from infection 06/17/2020 1032 by Bethann Punches, RN Outcome: Progressing 06/17/2020 1032 by Bethann Punches, RN Outcome: Progressing Goal: Diagnostic test results will improve 06/17/2020 1032 by Bethann Punches, RN Outcome: Progressing 06/17/2020 1032 by Bethann Punches, RN Outcome: Progressing Goal: Respiratory complications will improve 06/17/2020 1032 by Bethann Punches, RN Outcome: Progressing 06/17/2020 1032 by Bethann Punches, RN Outcome: Progressing Goal: Cardiovascular complication will be avoided 06/17/2020 1032 by Bethann Punches, RN Outcome: Progressing 06/17/2020 1032 by Bethann Punches, RN Outcome: Progressing   Problem: Activity: Goal: Risk for activity intolerance will decrease 06/17/2020 1032 by Bethann Punches, RN Outcome: Progressing 06/17/2020 1032 by Bethann Punches, RN Outcome: Progressing   Problem: Nutrition: Goal: Adequate nutrition will be maintained 06/17/2020 1032 by Bethann Punches, RN Outcome: Progressing 06/17/2020 1032 by Bethann Punches, RN Outcome: Progressing   Problem: Coping: Goal: Level of anxiety will decrease 06/17/2020 1032 by Bethann Punches, RN Outcome:  Progressing 06/17/2020 1032 by Bethann Punches, RN Outcome: Progressing   Problem: Elimination: Goal: Will not experience complications related to bowel motility 06/17/2020 1032 by Bethann Punches, RN Outcome: Progressing 06/17/2020 1032 by Bethann Punches, RN Outcome: Progressing Goal: Will not experience complications related to urinary retention 06/17/2020 1032 by Bethann Punches, RN Outcome: Progressing 06/17/2020 1032 by Bethann Punches, RN Outcome: Progressing   Problem: Pain Managment: Goal: General experience of comfort will improve 06/17/2020 1032 by Bethann Punches, RN Outcome: Progressing 06/17/2020 1032 by Bethann Punches, RN Outcome: Progressing   Problem: Safety: Goal: Ability to remain free from injury will improve 06/17/2020 1032 by Bethann Punches, RN Outcome: Progressing 06/17/2020 1032 by Bethann Punches, RN Outcome: Progressing   Problem: Skin Integrity: Goal: Risk for impaired skin integrity will decrease 06/17/2020 1032 by Bethann Punches, RN Outcome: Progressing 06/17/2020 1032 by Bethann Punches, RN Outcome: Progressing   Problem: Education: Goal: Required Educational Video(s) Outcome: Progressing   Problem: Clinical Measurements: Goal: Postoperative complications will be avoided or minimized Outcome: Progressing   Problem: Skin Integrity: Goal: Demonstration of wound healing without infection will improve Outcome: Progressing

## 2020-06-17 NOTE — Progress Notes (Signed)
18 Days Post-Op   Subjective/Chief Complaint: Has not seemed to have issues with intermittent NG clamping   Objective: Vital signs in last 24 hours: Temp:  [98.1 F (36.7 C)-98.3 F (36.8 C)] 98.2 F (36.8 C) (04/24 0352) Pulse Rate:  [102-105] 103 (04/24 0352) Resp:  [17-18] 18 (04/24 0352) BP: (119-125)/(78-82) 122/78 (04/24 0352) SpO2:  [95 %-96 %] 96 % (04/24 0352) Last BM Date: 06/13/20  Intake/Output from previous day: 04/23 0701 - 04/24 0700 In: 60 [P.O.:60] Out: 2400 [Urine:1550; Emesis/NG output:850] Intake/Output this shift: No intake/output data recorded.  Exam: Awake and alert NG drainage clear Abdomen mildly full, minimal in ostomy bag  Lab Results:  No results for input(s): WBC, HGB, HCT, PLT in the last 72 hours. BMET Recent Labs    06/15/20 0500 06/16/20 0319  NA 135 136  K 3.8 4.2  CL 102 106  CO2 28 26  GLUCOSE 126* 110*  BUN 10 10  CREATININE 0.36* 0.36*  CALCIUM 7.6* 7.6*   PT/INR No results for input(s): LABPROT, INR in the last 72 hours. ABG No results for input(s): PHART, HCO3 in the last 72 hours.  Invalid input(s): PCO2, PO2  Studies/Results: No results found.  Anti-infectives: Anti-infectives (From admission, onward)   Start     Dose/Rate Route Frequency Ordered Stop   05/30/20 0600  cefoTEtan (CEFOTAN) 2 g in sodium chloride 0.9 % 100 mL IVPB        2 g 200 mL/hr over 30 Minutes Intravenous To Surgery 05/29/20 1430 05/30/20 1745   05/29/20 1400  neomycin (MYCIFRADIN) tablet 1,000 mg       "And" Linked Group Details   1,000 mg Oral 3 times per day 05/29/20 1152 05/29/20 2228   05/29/20 1400  metroNIDAZOLE (FLAGYL) tablet 1,000 mg       "And" Linked Group Details   1,000 mg Oral 3 times per day 05/29/20 1152 05/29/20 2228      Assessment/Plan: COPD- PRN nebs Non-bleeding duodenal ulcer  Grade B esophogitis, gastritis, esophageal stenosis s/p dilation 4/2 Grade II internal hemorrhoids  Alcohol use disorder History  of tobacco use ABL anemia- hgb stableat8.5(4/21). Dr. Burr Medico recommendedstarting him on ferrous sulfate 325 mg twice dailywhen ileus resolves.  Protein calorie malnutrition- Pre-alb 8.0(4/11).Restarted TPN Crackles/wheezing -improving, continue duonebs PRN. CXR 4/21 with atelectasis possible infiltrate, afebrile  Rectosigmoid colon cancer S/P robotic assisted LAR with end colostomy 05/30/20 Dr. Dema Severin  POD#18  Will clamp NG and leave clamped. If tolerated, will remove tomorrow   LOS: 23 days    Coralie Keens 06/17/2020

## 2020-06-18 LAB — CBC
HCT: 28.4 % — ABNORMAL LOW (ref 39.0–52.0)
Hemoglobin: 8.5 g/dL — ABNORMAL LOW (ref 13.0–17.0)
MCH: 27.1 pg (ref 26.0–34.0)
MCHC: 29.9 g/dL — ABNORMAL LOW (ref 30.0–36.0)
MCV: 90.4 fL (ref 80.0–100.0)
Platelets: 364 10*3/uL (ref 150–400)
RBC: 3.14 MIL/uL — ABNORMAL LOW (ref 4.22–5.81)
RDW: 22 % — ABNORMAL HIGH (ref 11.5–15.5)
WBC: 5.8 10*3/uL (ref 4.0–10.5)
nRBC: 0 % (ref 0.0–0.2)

## 2020-06-18 LAB — DIFFERENTIAL
Abs Immature Granulocytes: 0.03 10*3/uL (ref 0.00–0.07)
Basophils Absolute: 0 10*3/uL (ref 0.0–0.1)
Basophils Relative: 1 %
Eosinophils Absolute: 0.1 10*3/uL (ref 0.0–0.5)
Eosinophils Relative: 2 %
Immature Granulocytes: 1 %
Lymphocytes Relative: 16 %
Lymphs Abs: 0.9 10*3/uL (ref 0.7–4.0)
Monocytes Absolute: 0.4 10*3/uL (ref 0.1–1.0)
Monocytes Relative: 7 %
Neutro Abs: 4.3 10*3/uL (ref 1.7–7.7)
Neutrophils Relative %: 73 %

## 2020-06-18 LAB — COMPREHENSIVE METABOLIC PANEL
ALT: 18 U/L (ref 0–44)
AST: 19 U/L (ref 15–41)
Albumin: 1.5 g/dL — ABNORMAL LOW (ref 3.5–5.0)
Alkaline Phosphatase: 93 U/L (ref 38–126)
Anion gap: 6 (ref 5–15)
BUN: 12 mg/dL (ref 8–23)
CO2: 24 mmol/L (ref 22–32)
Calcium: 7.6 mg/dL — ABNORMAL LOW (ref 8.9–10.3)
Chloride: 104 mmol/L (ref 98–111)
Creatinine, Ser: 0.33 mg/dL — ABNORMAL LOW (ref 0.61–1.24)
GFR, Estimated: >60 mL/min (ref >60)
Glucose, Bld: 121 mg/dL — ABNORMAL HIGH (ref 70–99)
Potassium: 3.8 mmol/L (ref 3.5–5.1)
Sodium: 134 mmol/L — ABNORMAL LOW (ref 135–145)
Total Bilirubin: 0.3 mg/dL (ref 0.3–1.2)
Total Protein: 5.1 g/dL — ABNORMAL LOW (ref 6.5–8.1)

## 2020-06-18 LAB — PHOSPHORUS: Phosphorus: 3.8 mg/dL (ref 2.5–4.6)

## 2020-06-18 LAB — MAGNESIUM: Magnesium: 1.8 mg/dL (ref 1.7–2.4)

## 2020-06-18 LAB — TRIGLYCERIDES: Triglycerides: 64 mg/dL (ref <150)

## 2020-06-18 LAB — PREALBUMIN: Prealbumin: 9.3 mg/dL — ABNORMAL LOW (ref 18–38)

## 2020-06-18 MED ORDER — POTASSIUM CHLORIDE 10 MEQ/50ML IV SOLN
10.0000 meq | INTRAVENOUS | Status: AC
  Administered 2020-06-18 (×2): 10 meq via INTRAVENOUS
  Filled 2020-06-18 (×2): qty 50

## 2020-06-18 MED ORDER — TRAVASOL 10 % IV SOLN
INTRAVENOUS | Status: AC
  Filled 2020-06-18: qty 1015.2

## 2020-06-18 MED ORDER — MAGNESIUM SULFATE IN D5W 1-5 GM/100ML-% IV SOLN
1.0000 g | Freq: Once | INTRAVENOUS | Status: AC
  Administered 2020-06-18: 1 g via INTRAVENOUS
  Filled 2020-06-18: qty 100

## 2020-06-18 NOTE — Progress Notes (Signed)
PT Cancellation Note  Patient Details Name: Marc Mcdonald MRN: [id number] DOB: 09-19-1955   Cancelled Treatment:    Reason Eval/Treat Not Completed: Fatigue/lethargy limiting ability to participate.  Reports he is not going to walk today, states he already did.  Difficult to understand pt, garbled speech at times.   Ramond Dial 06/18/2020, 3:49 PM   Mee Hives, PT MS Acute Rehab Dept. Number: [place] and [place]

## 2020-06-18 NOTE — Progress Notes (Addendum)
HD#24 Subjective:  Overnight Events: no significant overnight events  Patient is seen bedside with his son.  States that he is feeling ok this morning. Notes having significant ostomy output yesterday after ambulation.  His ostomy bag was emptied this morning.  Objective:  Vital signs in last 24 hours: Vitals:   06/17/20 0352 06/17/20 1456 06/17/20 2053 06/18/20 0443  BP: 122/78 122/79 135/88 121/75  Pulse: (!) 103 (!) 102 (!) 110 100  Resp: 18 18 18 18   Temp: 98.2 F (36.8 C) 98.2 F (36.8 C) 97.8 F (36.6 C) 98.1 F (36.7 C)  TempSrc: Oral Oral    SpO2: 96% 96% 97% 96%  Weight:      Height:       Supplemental O2: Room Air SpO2: 96 % O2 Flow Rate (L/min): 6 L/min   Physical Exam:  Physical Exam Constitutional:      General: He is not in acute distress.    Appearance: He is not ill-appearing.  Eyes:     General: No scleral icterus.       Right eye: No discharge.        Left eye: No discharge.     Conjunctiva/sclera: Conjunctivae normal.  Abdominal:     General: Bowel sounds are normal. There is distension (Mild distention).     Tenderness: There is no abdominal tenderness. There is no guarding.     Comments: Small output from ostomy bag.  Skin:    General: Skin is warm.     Coloration: Skin is not jaundiced.  Neurological:     Mental Status: He is alert.  Psychiatric:        Mood and Affect: Mood normal.        Thought Content: Thought content normal.        Judgment: Judgment normal.     Filed Weights   06/12/20 0428 06/14/20 0404 06/15/20 0401  Weight: 59.6 kg 59.5 kg 55 kg     Intake/Output Summary (Last 24 hours) at 06/18/2020 0633 Last data filed at 06/18/2020 0505 Gross per 24 hour  Intake 1008.51 ml  Output 1350 ml  Net -341.49 ml   Net IO Since Admission: -20,684.51 mL [06/18/20 0633]  Pertinent Labs: CBC Latest Ref Rng & Units 06/18/2020 06/14/2020 06/13/2020  WBC 4.0 - 10.5 K/uL 5.8 8.2 9.1  Hemoglobin 13.0 - 17.0 g/dL 8.5(L) 8.5(L)  8.5(L)  Hematocrit 39.0 - 52.0 % 28.4(L) 28.2(L) 28.1(L)  Platelets 150 - 400 K/uL 364 378 399    CMP Latest Ref Rng & Units 06/18/2020 06/16/2020 06/15/2020  Glucose 70 - 99 mg/dL 121(H) 110(H) 126(H)  BUN 8 - 23 mg/dL 12 10 10   Creatinine 0.61 - 1.24 mg/dL 0.33(L) 0.36(L) 0.36(L)  Sodium 135 - 145 mmol/L 134(L) 136 135  Potassium 3.5 - 5.1 mmol/L 3.8 4.2 3.8  Chloride 98 - 111 mmol/L 104 106 102  CO2 22 - 32 mmol/L 24 26 28   Calcium 8.9 - 10.3 mg/dL 7.6(L) 7.6(L) 7.6(L)  Total Protein 6.5 - 8.1 g/dL 5.1(L) - -  Total Bilirubin 0.3 - 1.2 mg/dL 0.3 - -  Alkaline Phos 38 - 126 U/L 93 - -  AST 15 - 41 U/L 19 - -  ALT 0 - 44 U/L 18 - -    Imaging: No results found.  Assessment/Plan:   Principal Problem:   sigmoid colon cancer Active Problems:   Symptomatic anemia   Iron deficiency anemia due to chronic blood loss   Duodenal ulcer  Gastritis and gastroduodenitis   Gastroesophageal reflux disease with esophagitis without hemorrhage   Esophageal stricture   Protein-calorie malnutrition, severe   Aortic atherosclerosis (HCC)   Ileus following gastrointestinal surgery ([place])   Hyponatremia   Patient Summary: Patient is 65 yo gentlemanwith past medical history of questionable COPD,past left5,6,8ribs fracture who presented to the ED for dizziness, dyspnea with exertion,likely due to symptomatic anemia, found to have duodenal ulcer and acolorectal masss/p resection. Currently managing post-op ileusand SBO.  Colorectal mass(T3B, N0, Mx) Post-op Ileus Good output from ostomy back overnight.  Bowel sounds present.  Looks like postop ileus is improving.  Plan to clamp NG tube and advance to clear liquid diet. - Continue TPN per surgery -Painmanagement with IV dilaudid PRN due to NPO status. He got 1mg  of Dilaudid yesterday.  -Noadjuvant chemotherapy per oncology.Follow-up with oncology in 1-74months  Non-bleeding duodenal ulcer Grade B esophagitis without bleeding,  gastritis s/p bx, moderate esophageal stenosis s/p dilation In the context of excessive NSAID use, heavy alcohol use and smoking -holdsucrafate for NPO - continue protonix 40mg IVBID(will need10w, then reduce to 40mg  daily) - gastric pathologynegative for H. Pylori  Iron deficiencyanemia2/2 GI bleed. Hgb stable - Startpo iron supplementwhen he can tolerate p.o.  Alcoholuse disorder -Holdthiamine and multivitamin per RD  New lung densitieswhich are questioned to be post inflammatory vs atelectasis.  -can repeatimaging outpatient to reevaluate  Diet:TPN and clear liquid diet IVF:TPN YSA:[id number] Code:Full PT/OT recs:Home Health,walker. TOC recs:home health PT/OT  Dispo: Anticipated discharge toHomein2-3days pendingPost-op ileus and SBO management.  Gaylan Gerold, DO 06/18/2020, 6:33 AM Pager: [phone]  Please contact the on call pager after 5 pm and on weekends at [phone].

## 2020-06-18 NOTE — Progress Notes (Signed)
PHARMACY - TOTAL PARENTERAL NUTRITION CONSULT NOTE  Indication: Prolonged ileus (14 days post-op)  Patient Measurements: Height: 5\' 11"  (180.3 cm) Weight: 55 kg (121 lb 4.1 oz) IBW/kg (Calculated) : 75.3 TPN AdjBW (KG): 56.8 Body mass index is 16.91 kg/m. Usual Weight: ~59kg per chart, ~62kg per patient   Assessment:  36 YOM with rectosigmoid colon cancer s/p LAR with end colostomy 05/30/20. Last standing weight was 54.9kg on 4/6 indicating about 5kg weight loss over 8 months. Patient had multiple episodes of n/v yesterday but none reported this morning. Has refused NG tube multiple times.   Patient previously on TPN which was weaned 06/10/2020. Pt tolerated full liquid diet x1 day, attempted regular diet x1 meal and had 2 emesis episodes. Pharmacy consulted for TPN.  Glucose / Insulin: no hx DM - AM glucose WNL.  Not on SSI. Electrolytes: K 3.8 (goal >/= 4), Na down 134 (max Na in TPN), Mag 1.8 (goal >/= 2), others WNL Renal: SCr < 1, BUN WNL Hepatic: LFTs / tbili / TG WNL, albumin 1.5, prealbumin 5.6 >> 9.3  Intake / Output; MIVF: UOP 0.5 ml/kg/hr, colostomy O/P 774mL, NG tube clamped and may be removed today 4/25 GI Imaging: 4/9 Abd XR: Moderate small bowel and gastric distention likely indicates post-op ileus. 4/11 CT abd: fluid filled SB consistent w/ postoperative ileus 4/12 Abd XR: NGT placed 4/18 CT abd: SBO in mid-ileum 4/19 Abd XR: NG tube tip proximal stomach placement 4/21 Abd XR: Markedly dilated small bowel consistent with bowel obstruction. GI Surgeries / Procedures:  4/6 robotic assisted LAR with end colostomy   Central access:  Double lumen R-brachial PICC placed 4/19 TPN start date: 4/12-4/17; restart 4/19  Nutritional Goals (per RD rec on 4/20): kCal: 2000-2200, Protein: 100-115g, Fluid: >/=2 L  Current Nutrition:  TPN  Plan: Continue TPN at goal 90 ml/hr to provide 101gAA, 50g ILE and 2150 kCals, meeting ~100% of needs Electrolytes in TPN: Na 131mEq/L,  incr K 97mEq/L, incr Mag to 92mEq/L, Ca 72mEq/L, Phos 20 mmol/L, Cl:Ac 1:1 Add standard MVI and trace elements to TPN  Adding thiamine to TPN daily   KCL x 2 runs Mag sulfate 1gm IV x 1 F/U AM labs, standard TPN labs on Mon/Thurs, possibility of NG tube removal and diet initiation  Hayly Litsey D. Mina Marble, PharmD, BCPS, [place] 06/18/2020, 8:25 AM

## 2020-06-18 NOTE — Progress Notes (Signed)
Occupational Therapy Treatment Patient Details Name: Marc Mcdonald MRN: [id number] DOB: [birth year] Today's Date: 06/18/2020    History of present illness Pt adm 05/25/20 with dizziness, weakness, and dyspnea. Pt with Hgb of 5.5 and found to have duodenal ulcer and rectal mass. Oncology and surgery involved and staging of rectal CA in process. Pt had surgery on 4/7 to place an End Ostomy. CT 4/19 showed possible SBO with transition in the mid ileum; PMH - ?copd, lt rib fx's 08/2019, etoh   OT comments  Pt progressing towards OT goals. With verbal encouragement, pt completed 2 transfers and basic ADL's sitting EOB. Pt discouraged with length of stay in hospital and wants his health to start getting better, verbalized understanding of how mobility will help this process. OT will continue following up to assist pt in independence with ADL's and functional mobility.    Follow Up Recommendations  Home health OT    Equipment Recommendations  Tub/shower seat    Recommendations for Other Services      Precautions / Restrictions Precautions Precautions: Fall Precaution Comments: NG tube in place and clamped Restrictions Weight Bearing Restrictions: No       Mobility Bed Mobility Overal bed mobility: Modified Independent                  Transfers Overall transfer level: Needs assistance Equipment used: Rolling walker (2 wheeled) Transfers: Sit to/from Stand Sit to Stand: Min guard         General transfer comment: Min guard from low bed, no RW until ambulating    Balance Overall balance assessment: Needs assistance Sitting-balance support: Feet supported Sitting balance-Leahy Scale: Good     Standing balance support: Bilateral upper extremity supported Standing balance-Leahy Scale: Fair Standing balance comment: Static standing w/out RW, dynamic standing, pt needs RW                           ADL either performed or assessed with clinical judgement   ADL  Overall ADL's : Needs assistance/impaired Eating/Feeding: Set up;Sitting Eating/Feeding Details (indicate cue type and reason): Pt able to bring drink to his mouth and take a sip Grooming: Wash/dry hands;Set up;Sitting Grooming Details (indicate cue type and reason): completed EOB                 Toilet Transfer: Min Nurse, learning disability Details (indicate cue type and reason): Simulated to recliner and back         Functional mobility during ADLs: Min guard;Rolling walker General ADL Comments: Pt requiring min guard to stand and ambulate due to balance concerns.     Vision   Vision Assessment?: No apparent visual deficits   Perception     Praxis      Cognition Arousal/Alertness: Awake/alert Behavior During Therapy: WFL for tasks assessed/performed Overall Cognitive Status: Within Functional Limits for tasks assessed                                          Exercises     Shoulder Instructions       General Comments VSS on RA    Pertinent Vitals/ Pain       Pain Assessment: No/denies pain  Home Living  Prior Functioning/Environment              Frequency  Min 2X/week        Progress Toward Goals  OT Goals(current goals can now be found in the care plan section)  Progress towards OT goals: Progressing toward goals  Acute Rehab OT Goals Patient Stated Goal: go home OT Goal Formulation: With patient Time For Goal Achievement: 06/25/20 Potential to Achieve Goals: Good ADL Goals Pt Will Perform Grooming: Independently;standing Additional ADL Goal #1: Pt will perform UE and LE dressing sitting, with mod I, using AE as needed. Additional ADL Goal #2: Pt will report and demonstrate 3 fall prevention techniques.  Plan Discharge plan remains appropriate;Frequency remains appropriate    Co-evaluation                 AM-PAC OT "6 Clicks" Daily Activity      Outcome Measure   Help from another person eating meals?: A Little Help from another person taking care of personal grooming?: A Little Help from another person toileting, which includes using toliet, bedpan, or urinal?: A Little Help from another person bathing (including washing, rinsing, drying)?: A Little Help from another person to put on and taking off regular upper body clothing?: None Help from another person to put on and taking off regular lower body clothing?: A Little 6 Click Score: 19    End of Session    OT Visit Diagnosis: Unsteadiness on feet (R26.81);Muscle weakness (generalized) (M62.81)   Activity Tolerance     Patient Left     Nurse Communication          Time: 1442-1500 OT Time Calculation (min): 18 min  Charges: OT General Charges $OT Visit: 1 Visit OT Treatments $Self Care/Home Management : 8-22 mins  [name] H., OTR/L Acute Rehabilitation  Shiron Whetsel Elane Teja Judice 06/18/2020, 4:42 PM

## 2020-06-18 NOTE — Progress Notes (Signed)
[facility] Progress Note  19 Days Post-Op  Subjective: CC-  Getting up to walk in the hall. Denies worsening abdominal bloating/pain, nausea, or vomiting since NG tube clamped yesterday. He has been taking in ice chips. 750cc output recorded from colostomy.  Objective: Vital signs in last 24 hours: Temp:  [97.8 F (36.6 C)-98.2 F (36.8 C)] 98.1 F (36.7 C) (04/25 0443) Pulse Rate:  [100-110] 100 (04/25 0443) Resp:  [18] 18 (04/25 0443) BP: (121-135)/(75-88) 121/75 (04/25 0443) SpO2:  [96 %-97 %] 96 % (04/25 0443) Last BM Date: 06/18/20  Intake/Output from previous day: 04/24 0701 - 04/25 0700 In: 1008.5 [P.O.:120; I.V.:888.5] Out: 1350 [Urine:600; Stool:750] Intake/Output this shift: No intake/output data recorded.  PE: Gen: Alert, NAD Pulm: Normal rate and efforton room air GEZ:MOQH, minimal distension,nontender,few BS heard. Incisions c/d/i. Stoma pink, budded and viable. Colostomy bag liquid stool and air in pouch   Lab Results:  Recent Labs    06/18/20 0341  WBC 5.8  HGB 8.5*  HCT 28.4*  PLT 364   BMET Recent Labs    06/16/20 0319 06/18/20 0341  NA 136 134*  K 4.2 3.8  CL 106 104  CO2 26 24  GLUCOSE 110* 121*  BUN 10 12  CREATININE 0.36* 0.33*  CALCIUM 7.6* 7.6*   PT/INR No results for input(s): LABPROT, INR in the last 72 hours. CMP     Component Value Date/Time   NA 134 (L) 06/18/2020 0341   K 3.8 06/18/2020 0341   CL 104 06/18/2020 0341   CO2 24 06/18/2020 0341   GLUCOSE 121 (H) 06/18/2020 0341   BUN 12 06/18/2020 0341   CREATININE 0.33 (L) 06/18/2020 0341   CALCIUM 7.6 (L) 06/18/2020 0341   PROT 5.1 (L) 06/18/2020 0341   ALBUMIN 1.5 (L) 06/18/2020 0341   AST 19 06/18/2020 0341   ALT 18 06/18/2020 0341   ALKPHOS 93 06/18/2020 0341   BILITOT 0.3 06/18/2020 0341   GFRNONAA >60 06/18/2020 0341   GFRAA >60 09/09/2019 0207   Lipase  No results found for: LIPASE     Studies/Results: No results  found.  Anti-infectives: Anti-infectives (From admission, onward)   Start     Dose/Rate Route Frequency Ordered Stop   05/30/20 0600  cefoTEtan (CEFOTAN) 2 g in sodium chloride 0.9 % 100 mL IVPB        2 g 200 mL/hr over 30 Minutes Intravenous To Surgery 05/29/20 1430 05/30/20 1745   05/29/20 1400  neomycin (MYCIFRADIN) tablet 1,000 mg       "And" Linked Group Details   1,000 mg Oral 3 times per day 05/29/20 1152 05/29/20 2228   05/29/20 1400  metroNIDAZOLE (FLAGYL) tablet 1,000 mg       "And" Linked Group Details   1,000 mg Oral 3 times per day 05/29/20 1152 05/29/20 2228       Assessment/Plan COPD- PRN nebs Non-bleeding duodenal ulcer  Grade B esophogitis, gastritis, esophageal stenosis s/p dilation 4/2 Grade II internal hemorrhoids  Alcohol use disorder History of tobacco use ABL anemia- hgb stableat8.5. Dr. Burr Medico recommendedstarting him on ferrous sulfate 325 mg twice dailywhen ileus resolves.  Protein calorie malnutrition- Pre-alb 8.0(4/11)>> 9.3 (4/25). Continue TPN Crackles/wheezing -improving, continue duonebs PRN. CXR 4/21 with atelectasis possible infiltrate, afebrile  Rectosigmoid colon cancer S/P robotic assisted LAR with end colostomy 05/30/20 Dr. Dema Severin -POD#19 -Path=T3N0 adenocarcinoma.Seen byDr. Allena Napoleon arrange follow up. - WOC following for colostomy teaching -removedJP4/16 -CT scan 4/11 w/ ileus. No postoperative fluid collection or  other complication noted. - CT scan 4/18 with possible SBO and transition in the mid ileum, no evidence of intraabdominal abscess - Keep NG tube clamped and give clear liquid tray today. Continue therapies, mobilize. Continue TPN until reliably tolerating diet.  FEN:TPN, NGT clamped, CLD VTE: SCDs,Lovenox ID: neomycin/flagyl 4/5, cefotetan 4/6. None currently Foley: removed POD1. Voiding. Follow-Up- Dr. Dema Severin and Dr. Burr Medico     LOS: 24 days    Wellington Hampshire, [facility]  Surgery 06/18/2020, 8:51 AM Please see Amion for pager number during day hours 7:00am-4:30pm

## 2020-06-19 LAB — PHOSPHORUS: Phosphorus: 4.1 mg/dL (ref 2.5–4.6)

## 2020-06-19 LAB — BASIC METABOLIC PANEL
Anion gap: 3 — ABNORMAL LOW (ref 5–15)
BUN: 10 mg/dL (ref 8–23)
CO2: 27 mmol/L (ref 22–32)
Calcium: 7.8 mg/dL — ABNORMAL LOW (ref 8.9–10.3)
Chloride: 104 mmol/L (ref 98–111)
Creatinine, Ser: 0.36 mg/dL — ABNORMAL LOW (ref 0.61–1.24)
GFR, Estimated: >60 mL/min (ref >60)
Glucose, Bld: 110 mg/dL — ABNORMAL HIGH (ref 70–99)
Potassium: 4.1 mmol/L (ref 3.5–5.1)
Sodium: 134 mmol/L — ABNORMAL LOW (ref 135–145)

## 2020-06-19 LAB — MAGNESIUM: Magnesium: 2 mg/dL (ref 1.7–2.4)

## 2020-06-19 MED ORDER — POLYETHYLENE GLYCOL 3350 17 G PO PACK
17.0000 g | PACK | Freq: Every day | ORAL | Status: DC
  Administered 2020-06-19 – 2020-06-21 (×3): 17 g via ORAL
  Filled 2020-06-19 (×3): qty 1

## 2020-06-19 MED ORDER — TRAVASOL 10 % IV SOLN
INTRAVENOUS | Status: AC
  Filled 2020-06-19: qty 507.6

## 2020-06-19 MED ORDER — DOCUSATE SODIUM 100 MG PO CAPS
100.0000 mg | ORAL_CAPSULE | Freq: Two times a day (BID) | ORAL | Status: DC
  Administered 2020-06-19 – 2020-06-21 (×5): 100 mg via ORAL
  Filled 2020-06-19 (×5): qty 1

## 2020-06-19 MED ORDER — ENSURE ENLIVE PO LIQD
237.0000 mL | Freq: Three times a day (TID) | ORAL | Status: DC
  Administered 2020-06-19 – 2020-06-21 (×2): 237 mL via ORAL

## 2020-06-19 MED ORDER — BOOST / RESOURCE BREEZE PO LIQD CUSTOM
1.0000 | Freq: Three times a day (TID) | ORAL | Status: DC
  Administered 2020-06-19: 1 via ORAL

## 2020-06-19 NOTE — Progress Notes (Addendum)
HD#25 Subjective:  Overnight Events: No event  During evaluation this morning, patient reports the NGT was pulled earlier this morning, and his diet has been advanced. When asked about abdominal distension, he states it always improves with walking.   Objective:  Vital signs in last 24 hours: Vitals:   06/18/20 0443 06/18/20 1350 06/18/20 2000 06/19/20 0404  BP: 121/75 115/81 123/86 119/83  Pulse: 100 (!) 101 84 97  Resp: 18 20 18 17   Temp: 98.1 F (36.7 C) 97.8 F (36.6 C) 97.9 F (36.6 C) 97.6 F (36.4 C)  TempSrc:  Oral Oral Oral  SpO2: 96% 96% 97% 97%  Weight:      Height:       Supplemental O2: Room Air SpO2: 97 % O2 Flow Rate (L/min): 6 L/min   Physical Exam:  Physical Exam Constitutional:      General: He is not in acute distress.    Appearance: He is not toxic-appearing.  Eyes:     General: No scleral icterus.       Right eye: No discharge.        Left eye: No discharge.     Conjunctiva/sclera: Conjunctivae normal.  Abdominal:     General: Bowel sounds are normal.     Tenderness: There is no abdominal tenderness. There is no guarding.     Comments: Small output seen in the ostomy bag.  Abdomen remains distended and tympanic to percussion.  Skin:    General: Skin is warm.  Neurological:     Mental Status: He is alert.  Psychiatric:        Mood and Affect: Mood normal.        Thought Content: Thought content normal.        Judgment: Judgment normal.     Filed Weights   06/12/20 0428 06/14/20 0404 06/15/20 0401  Weight: 59.6 kg 59.5 kg 55 kg     Intake/Output Summary (Last 24 hours) at 06/19/2020 0705 Last data filed at 06/19/2020 0500 Gross per 24 hour  Intake 270 ml  Output 2540 ml  Net -2270 ml   Net IO Since Admission: -22,954.51 mL [06/19/20 0705]  Pertinent Labs: CBC Latest Ref Rng & Units 06/18/2020 06/14/2020 06/13/2020  WBC 4.0 - 10.5 K/uL 5.8 8.2 9.1  Hemoglobin 13.0 - 17.0 g/dL 8.5(L) 8.5(L) 8.5(L)  Hematocrit 39.0 - 52.0 %  28.4(L) 28.2(L) 28.1(L)  Platelets 150 - 400 K/uL 364 378 399    CMP Latest Ref Rng & Units 06/19/2020 06/18/2020 06/16/2020  Glucose 70 - 99 mg/dL 110(H) 121(H) 110(H)  BUN 8 - 23 mg/dL 10 12 10   Creatinine 0.61 - 1.24 mg/dL 0.36(L) 0.33(L) 0.36(L)  Sodium 135 - 145 mmol/L 134(L) 134(L) 136  Potassium 3.5 - 5.1 mmol/L 4.1 3.8 4.2  Chloride 98 - 111 mmol/L 104 104 106  CO2 22 - 32 mmol/L 27 24 26   Calcium 8.9 - 10.3 mg/dL 7.8(L) 7.6(L) 7.6(L)  Total Protein 6.5 - 8.1 g/dL - 5.1(L) -  Total Bilirubin 0.3 - 1.2 mg/dL - 0.3 -  Alkaline Phos 38 - 126 U/L - 93 -  AST 15 - 41 U/L - 19 -  ALT 0 - 44 U/L - 18 -    Imaging: No results found.  Assessment/Plan:   Principal Problem:   sigmoid colon cancer Active Problems:   Symptomatic anemia   Iron deficiency anemia due to chronic blood loss   Duodenal ulcer   Gastritis and gastroduodenitis   Gastroesophageal reflux disease  with esophagitis without hemorrhage   Esophageal stricture   Protein-calorie malnutrition, severe   Aortic atherosclerosis (HCC)   Ileus following gastrointestinal surgery ([employer])   Hyponatremia   Patient Summary: Patient is 66 yo gentleman with past medical history of questionable COPD, past left 5,6,8 ribs fracture who presented to the ED for dizziness, dyspnea with exertion, due to symptomatic anemia, found to have duodenal ulcer and a colorectal mass s/p resection. Currently managing post-op ileus and SBO.   Colorectal mass (T3B, N0, Mx) Post-op Ileus-improving NG tube was removed.  Patient tolerated clear liquid diet well.  His abdomen is still distended and tympanic to percussion.  Will need to advance diet very slowly to avoid emesis. - Reduce TPN with increased oral intake. - Pain management scheduled Tylenol and oxycodone as needed - No adjuvant chemotherapy per oncology.  Follow-up with oncology in 1-2 months   Non-bleeding duodenal ulcer Grade B esophagitis without bleeding, gastritis s/p bx, moderate  esophageal stenosis s/p dilation In the context of excessive NSAID use, heavy alcohol use and smoking - hold sucrafate for NPO  - continue protonix 40mg  IV BID (will need 10w, then reduce to 40mg  daily) - gastric pathology negative for H. Pylori   Iron deficiency anemia 2/2 GI bleed. Hemoglobin is trending down slowly, likely from phlebotomy - Start oral iron supplement when he can tolerate p.o.   Alcohol use disorder -Hold thiamine and multivitamin per RD   New lung densities which are questioned to be post inflammatory vs atelectasis.  -can repeat imaging outpatient to reevaluate   Diet: TPN and clear liquid diet IVF: TPN VTE: Enoxaparin Code: Full PT/OT recs: Home Health, walker. TOC recs: home health PT/OT   Dispo: Anticipated discharge to Home in 3-4 days pending Post-op ileus and SBO management .   Marc Gerold, DO 06/19/2020, 7:05 AM Pager: [phone]  Please contact the on call pager after 5 pm and on weekends at [phone].

## 2020-06-19 NOTE — Progress Notes (Signed)
Occupational Therapy Treatment Patient Details Name: Marc Mcdonald MRN: [id number] DOB: 01/31/1956 Today's Date: 06/19/2020    History of present illness Pt adm 05/25/20 with dizziness, weakness, and dyspnea. Pt with Hgb of 5.5 and found to have duodenal ulcer and rectal mass. Oncology and surgery involved and staging of rectal CA in process. Pt had surgery on 4/7 to place an End Ostomy. CT 4/19 showed possible SBO with transition in the mid ileum; PMH - ?copd, lt rib fx's 08/2019, etoh   OT comments  Pt progressing well towards OT goals. With minimal encouragement pt completed basic grooming and functional mobility using RW. Pt requires min guard for safety due to balance concerns. OT will continue to follow up and assist pt in progressing towards independence.    Follow Up Recommendations  Home health OT    Equipment Recommendations  Tub/shower seat    Recommendations for Other Services      Precautions / Restrictions Precautions Precautions: Fall Restrictions Weight Bearing Restrictions: No       Mobility Bed Mobility Overal bed mobility: Modified Independent Bed Mobility: Supine to Sit;Sit to Supine     Supine to sit: Modified independent (Device/Increase time) Sit to supine: Modified independent (Device/Increase time)        Transfers Overall transfer level: Needs assistance Equipment used: Rolling walker (2 wheeled) Transfers: Sit to/from Stand Sit to Stand: Min guard         General transfer comment: Min guard from recliner    Balance Overall balance assessment: Needs assistance Sitting-balance support: Feet supported Sitting balance-Leahy Scale: Good     Standing balance support: Bilateral upper extremity supported Standing balance-Leahy Scale: Fair Standing balance comment: Pt needs RW at this time, states he feels like he will fall over without it.                           ADL either performed or assessed with clinical judgement   ADL  Overall ADL's : Needs assistance/impaired Eating/Feeding: Independent;Sitting Eating/Feeding Details (indicate cue type and reason): Pt able to bring drink to his mouth and take a sip Grooming: Wash/dry hands;Supervision/safety;Standing Grooming Details (indicate cue type and reason): completed at sink             Lower Body Dressing: Supervision/safety;Sitting/lateral leans Lower Body Dressing Details (indicate cue type and reason): donned socks with supervision for safety. Toilet Transfer: Min guard;Ambulation;RW Toilet Transfer Details (indicate cue type and reason): simulated in bathroom         Functional mobility during ADLs: Min guard;Rolling walker General ADL Comments: Pt requiring min guard to stand and ambulate due to balance concerns.     Vision       Perception     Praxis      Cognition Arousal/Alertness: Awake/alert Behavior During Therapy: WFL for tasks assessed/performed Overall Cognitive Status: Within Functional Limits for tasks assessed                                 General Comments: Pt very agreeable this session        Exercises     Shoulder Instructions       General Comments VSS on RA    Pertinent Vitals/ Pain       Pain Assessment: 0-10 Pain Score: 6  Pain Location: R side of trunk Pain Descriptors / Indicators: Grimacing;Sharp Pain Intervention(s): Monitored during session;Repositioned  Home Living                                          Prior Functioning/Environment              Frequency  Min 2X/week        Progress Toward Goals  OT Goals(current goals can now be found in the care plan section)  Progress towards OT goals: Progressing toward goals  Acute Rehab OT Goals Patient Stated Goal: go home OT Goal Formulation: With patient Time For Goal Achievement: 06/25/20 Potential to Achieve Goals: Good ADL Goals Pt Will Perform Grooming: Independently;standing Additional ADL  Goal #1: Pt will perform UE and LE dressing sitting, with mod I, using AE as needed. Additional ADL Goal #2: Pt will report and demonstrate 3 fall prevention techniques.  Plan Discharge plan remains appropriate;Frequency remains appropriate    Co-evaluation                 AM-PAC OT "6 Clicks" Daily Activity     Outcome Measure   Help from another person eating meals?: None Help from another person taking care of personal grooming?: A Little Help from another person toileting, which includes using toliet, bedpan, or urinal?: A Little Help from another person bathing (including washing, rinsing, drying)?: A Little Help from another person to put on and taking off regular upper body clothing?: None Help from another person to put on and taking off regular lower body clothing?: A Little 6 Click Score: 20    End of Session Equipment Utilized During Treatment: Gait belt;Rolling walker  OT Visit Diagnosis: Muscle weakness (generalized) (M62.81);Unsteadiness on feet (R26.81)   Activity Tolerance Patient tolerated treatment well   Patient Left in bed;with call bell/phone within reach   Nurse Communication Mobility status        Time: 4010-2725 OT Time Calculation (min): 12 min  Charges: OT General Charges $OT Visit: 1 Visit OT Treatments $Therapeutic Activity: 8-22 mins  Josua Ferrebee H., OTR/L Acute Rehabilitation  Envy Meno Elane Bing Plume 06/19/2020, 5:22 PM

## 2020-06-19 NOTE — Progress Notes (Signed)
PHARMACY - TOTAL PARENTERAL NUTRITION CONSULT NOTE  Indication: Prolonged ileus (20 days post-op)  Patient Measurements: Height: 5\' 11"  (180.3 cm) Weight: 55 kg (121 lb 4.1 oz) IBW/kg (Calculated) : 75.3 TPN AdjBW (KG): 56.8 Body mass index is 16.91 kg/m. Usual Weight: ~59kg per chart, ~62kg per patient   Assessment:  40 YOM with rectosigmoid colon cancer s/p LAR with end colostomy 05/30/20. Last standing weight was 54.9kg on 4/6 indicating about 5kg weight loss over 8 months. Patient had multiple episodes of n/v yesterday but none reported this morning. Has refused NG tube multiple times. Patient previously on TPN which was weaned 06/10/2020. Pt tolerated full liquid diet x1 day, attempted regular diet x1 meal and had 2 emesis episodes. Pharmacy consulted for TPN.  Glucose / Insulin: no hx DM - AM glucose WNL.  Not on SSI. Electrolytes: K 4.1 (goal >/= 4), Na down 134 (max Na in TPN), Mag 2.0 (goal >/= 2), CoCa 9.8, others WNL Renal: SCr < 1, BUN WNL Hepatic: LFTs / tbili / TG WNL, albumin 1.5, prealbumin 5.6>9.3  Intake / Output; MIVF: UOP 1.2 ml/kg/hr, colostomy O/P 773mL, NG tube clamped 4/25 GI Imaging: 4/9 Abd XR: Moderate small bowel and gastric distention likely indicates post-op ileus. 4/11 CT abd: fluid filled SB consistent w/ postoperative ileus 4/12 Abd XR: NGT placed 4/18 CT abd: SBO in mid-ileum 4/19 Abd XR: NG tube tip proximal stomach placement 4/21 Abd XR: Markedly dilated small bowel consistent with bowel obstruction  GI Surgeries / Procedures:  4/6 robotic assisted LAR with end colostomy   Central access:  Double lumen R-brachial PICC placed 4/19 TPN start date: 4/12-4/17; restart 4/19  Nutritional Goals (per RD rec on 4/20): kCal: 2000-2200, Protein: 100-115g, Fluid: >/=2 L  Current Nutrition:  TPN + ice chips  Plan: Reduce TPN to 45 ml/hr per surgery To account for rate adjustment electrolytes in TPN: Na 135mEq/L, K 119mEq/L, Mag to 42mEq/L, Ca 79mEq/L,  Phos 30 mmol/L, Cl:Ac 1:1 Add standard MVI and trace elements to TPN  Adding thiamine to TPN daily   F/U AM labs, standard TPN labs on Mon/Thurs, possibility of NG tube removal and diet initiation   Wilson Singer, PharmD PGY1 Pharmacy Resident 06/19/2020 7:15 AM

## 2020-06-19 NOTE — Progress Notes (Signed)
Nutrition Follow-up  DOCUMENTATION CODES:   Severe malnutrition in context of chronic illness,Underweight  INTERVENTION:   Recommend continuing TPN until pt demonstrating tolerance of FL/puree diet with po intake >50% of nutritional needs  Ensure Enlive po TID, each supplement provides 350 kcal and 20 grams of protein  Magic cup TID with meals, each supplement provides 290 kcal and 9 grams of protein  Follow-up and provide diet education as needed prior to discharge; if pt requires FL/Puree diet at d/c, pt needs diet education to ensure adequate nutrition post d/c  Recommend resuming MVI with Minerals daily po once TPN discontinued; pt currently receiving standard MVI with trace elements in TPN   NUTRITION DIAGNOSIS:   Inadequate oral intake related to poor appetite,social / environmental circumstances,altered GI function as evidenced by NPO status.  Being addressed via diet advancement, supplements  GOAL:   Patient will meet greater than or equal to 90% of their needs  Progressing  MONITOR:   Diet advancement,PO intake,Labs,Weight trends  REASON FOR ASSESSMENT:   Consult New TPN/TNA  ASSESSMENT:   65 yo male admitted with symptomatic anemia, dyspnea on exertion, hyponatremia. PMH includes COPD, EtOH abuse (12-15 beers per day) and tobacco abuse (1 pack every other day)  Pt is ready to "get outta here." Pt reports he has "been here so long, he feels like [he] owns the f-ing place."  NG tube removed. Pt denies N/V/abd pain. +ostomy output.   TPN continues, decreased to half rate today  Tolerating CL diet, advanced to FL. Noted plan for liquid/puree diet for several days and maybe even after he gets discharged for a period of time.  Discussed oral nutrition supplements with patient. Pt did not appear enthusiastic about trying any supplements. Pt reports he has not tried a [employer] yet. Pt reports he was told to drink Boost/Ensure in the past but never did.  Explained to patient the importance of adequate nutrition and that if pt is to be discharged on a FL/puree diet, pt will likely require an oral nutrition supplement in order to meet nutritional needs. Pt agreeable to try Ensure Enlive at this time. If pt does not like, plan to try another supplement  Labs: reviewed Meds: reviewed    Diet Order:   Diet Order            Diet full liquid Room service appropriate? Yes; Fluid consistency: Thin  Diet effective now                 EDUCATION NEEDS:   Not appropriate for education at this time  Skin:  Skin Assessment: Skin Integrity Issues: Skin Integrity Issues:: Incisions Incisions: closed abdomen  Last BM:  06/13/20 (25 ml output via colostomy)  Height:   Ht Readings from Last 1 Encounters:  05/26/20 5\' 11"  (1.803 m)    Weight:   Wt Readings from Last 1 Encounters:  06/15/20 55 kg    Ideal Body Weight:  78.2 kg  BMI:  Body mass index is 16.91 kg/m.  Estimated Nutritional Needs:   Kcal:  2000-2200 kcals  Protein:  100-115 g  Fluid:  >/= 2 L   Kerman Passey MS, RDN, LDN, CNSC Registered Dietitian III Clinical Nutrition RD Pager and On-Call Pager Number Located in [place]

## 2020-06-19 NOTE — Progress Notes (Signed)
[facility] Progress Note  20 Days Post-Op  Subjective: CC-  NG tube has been clamped >48 hours. Patient denies worsening abdominal pain, nausea, vomiting. Colostomy productive. Tolerating clear liquids.  Objective: Vital signs in last 24 hours: Temp:  [97.6 F (36.4 C)-97.9 F (36.6 C)] 97.6 F (36.4 C) (04/26 0404) Pulse Rate:  [84-101] 97 (04/26 0404) Resp:  [17-20] 17 (04/26 0404) BP: (115-123)/(81-86) 119/83 (04/26 0404) SpO2:  [96 %-97 %] 97 % (04/26 0404) Last BM Date: 06/19/20  Intake/Output from previous day: 04/25 0701 - 04/26 0700 In: 270 [P.O.:270] Out: 2540 [Urine:1550; Stool:750] Intake/Output this shift: No intake/output data recorded.  PE: Gen: Alert, NAD Pulm: Normal rate and efforton room air MEQ:ASTM,HDQQIWLNLGXQJJHER,DEYCXKGYJ, +BS. Incisions c/d/i. Stoma pink, budded and viable. Colostomy bagliquid stool and air in pouch   Lab Results:  Recent Labs    06/18/20 0341  WBC 5.8  HGB 8.5*  HCT 28.4*  PLT 364   BMET Recent Labs    06/18/20 0341 06/19/20 0318  NA 134* 134*  K 3.8 4.1  CL 104 104  CO2 24 27  GLUCOSE 121* 110*  BUN 12 10  CREATININE 0.33* 0.36*  CALCIUM 7.6* 7.8*   PT/INR No results for input(s): LABPROT, INR in the last 72 hours. CMP     Component Value Date/Time   NA 134 (L) 06/19/2020 0318   K 4.1 06/19/2020 0318   CL 104 06/19/2020 0318   CO2 27 06/19/2020 0318   GLUCOSE 110 (H) 06/19/2020 0318   BUN 10 06/19/2020 0318   CREATININE 0.36 (L) 06/19/2020 0318   CALCIUM 7.8 (L) 06/19/2020 0318   PROT 5.1 (L) 06/18/2020 0341   ALBUMIN 1.5 (L) 06/18/2020 0341   AST 19 06/18/2020 0341   ALT 18 06/18/2020 0341   ALKPHOS 93 06/18/2020 0341   BILITOT 0.3 06/18/2020 0341   GFRNONAA >60 06/19/2020 0318   GFRAA >60 09/09/2019 0207   Lipase  No results found for: LIPASE     Studies/Results: No results found.  Anti-infectives: Anti-infectives (From admission, onward)   Start     Dose/Rate  Route Frequency Ordered Stop   05/30/20 0600  cefoTEtan (CEFOTAN) 2 g in sodium chloride 0.9 % 100 mL IVPB        2 g 200 mL/hr over 30 Minutes Intravenous To Surgery 05/29/20 1430 05/30/20 1745   05/29/20 1400  neomycin (MYCIFRADIN) tablet 1,000 mg       "And" Linked Group Details   1,000 mg Oral 3 times per day 05/29/20 1152 05/29/20 2228   05/29/20 1400  metroNIDAZOLE (FLAGYL) tablet 1,000 mg       "And" Linked Group Details   1,000 mg Oral 3 times per day 05/29/20 1152 05/29/20 2228       Assessment/Plan COPD- PRN nebs Non-bleeding duodenal ulcer  Grade B esophogitis, gastritis, esophageal stenosis s/p dilation 4/2 Grade II internal hemorrhoids  Alcohol use disorder History of tobacco use ABL anemia- hgb stableat8.5 (4/25). Dr. Burr Medico recommendedstarting him on ferrous sulfate 325 mg twice dailywhen ileus resolves.  Protein calorie malnutrition- Pre-alb 8.0(4/11)>> 9.3 (4/25). 1/2 TPN on 4/26 Crackles/wheezing -improving, continue duonebs PRN. CXR4/21with atelectasis possible infiltrate,afebrile  Rectosigmoid colon cancer S/P robotic assisted LAR with end colostomy 05/30/20 Dr. Dema Severin -POD#20 -Path=T3N0 adenocarcinoma.Seen byDr. Allena Napoleon arrange follow up. - [facility] following for colostomy teaching -removedJP4/16 -CT scan 4/11 w/ ileus. No postoperative fluid collection or other complication noted. - CT scan 4/18 with possible SBO and transition in the mid ileum, no  evidence of intraabdominal abscess - tolerated >48 hours of NG tube clamping, colostomy productive - D/c NG tube and advance to full liquids. Will likely plan to continue liquid/puree diet for several days and maybe even after he gets discharged for a period of time. 1/2 TPN. Continue mobilizing.   FEN:1/2 TPN, FLD, Boost VTE: SCDs,Lovenox ID: neomycin/flagyl 4/5, cefotetan 4/6. None currently Foley: removed POD1. Voiding. Follow-Up- Dr. Dema Severin and Dr. Burr Medico     LOS: 25 days    Wellington Hampshire, [facility] 06/19/2020, 8:23 AM Please see Amion for pager number during day hours 7:00am-4:30pm

## 2020-06-19 NOTE — Progress Notes (Signed)
Physical Therapy Treatment Patient Details Name: Marc Mcdonald MRN: [id number] DOB: 07/02/1955 Today's Date: 06/19/2020    History of Present Illness Pt adm 05/25/20 with dizziness, weakness, and dyspnea. Pt with Hgb of 5.5 and found to have duodenal ulcer and rectal mass. Oncology and surgery involved and staging of rectal CA in process. Pt had surgery on 4/7 to place an End Ostomy. CT 4/19 showed possible SBO with transition in the mid ileum; PMH - ?copd, lt rib fx's 08/2019, etoh    PT Comments    Pt was seen for progression of gait and exercises today, somewhat tired with effort but did take his time and complete it.  He is having significant pain on back and R side, with sensitivity on back causing an issue trying to sit in the chair.  Placed a pillow under him to cushion and was then safely and comfortably able to rest against the chair.  Follow along with him to increase endurance and reinforce safety with gait.  Good performance today, and will focus on the strength and endurance needs for therapy.   Follow Up Recommendations  Home health PT;Supervision for mobility/OOB     Equipment Recommendations  Rolling walker with 5" wheels    Recommendations for Other Services       Precautions / Restrictions Precautions Precautions: Fall Restrictions Weight Bearing Restrictions: No    Mobility  Bed Mobility Overal bed mobility: Modified Independent Bed Mobility: Supine to Sit;Sit to Supine     Supine to sit: Modified independent (Device/Increase time) Sit to supine: Modified independent (Device/Increase time)        Transfers Overall transfer level: Needs assistance Equipment used: Rolling walker (2 wheeled) Transfers: Sit to/from Stand Sit to Stand: Min guard         General transfer comment: Min guard from recliner  Ambulation/Gait Ambulation/Gait assistance: Min guard Gait Distance (Feet): 250 Feet Assistive device: Rolling walker (2 wheeled) Gait  Pattern/deviations: Step-through pattern;Decreased stride length Gait velocity: reduced slightly Gait velocity interpretation: <1.31 ft/sec, indicative of household ambulator General Gait Details: talked to patient about safety with obstacles and settng limits of endurance   Stairs             Wheelchair Mobility    Modified Rankin (Stroke Patients Only)       Balance Overall balance assessment: Needs assistance Sitting-balance support: Feet supported Sitting balance-Leahy Scale: Good     Standing balance support: Bilateral upper extremity supported Standing balance-Leahy Scale: Fair Standing balance comment: RW is helpful support, tends to fatigue in legs with gait                            Cognition Arousal/Alertness: Awake/alert Behavior During Therapy: WFL for tasks assessed/performed Overall Cognitive Status: Within Functional Limits for tasks assessed                                 General Comments: pt was just done with bathing and a bit tired but agreed to therapy      Exercises General Exercises - Lower Extremity Long Arc Quad: Strengthening;10 reps Heel Slides: Strengthening;10 reps Hip ABduction/ADduction: Strengthening;10 reps    General Comments General comments (skin integrity, edema, etc.): VSS on RA      Pertinent Vitals/Pain Pain Assessment: Faces Pain Score: 6  Faces Pain Scale: Hurts little more Pain Location: R side of trunk Pain Descriptors /  Indicators: Grimacing;Guarding Pain Intervention(s): Monitored during session;Premedicated before session;Repositioned    Home Living                      Prior Function            PT Goals (current goals can now be found in the care plan section) Acute Rehab PT Goals Patient Stated Goal: go home Progress towards PT goals: Progressing toward goals    Frequency    Min 3X/week      PT Plan Current plan remains appropriate    Co-evaluation               AM-PAC PT "6 Clicks" Mobility   Outcome Measure  Help needed turning from your back to your side while in a flat bed without using bedrails?: A Little Help needed moving from lying on your back to sitting on the side of a flat bed without using bedrails?: A Little Help needed moving to and from a bed to a chair (including a wheelchair)?: A Little Help needed standing up from a chair using your arms (e.g., wheelchair or bedside chair)?: A Little Help needed to walk in hospital room?: A Little Help needed climbing 3-5 steps with a railing? : A Lot 6 Click Score: 17    End of Session Equipment Utilized During Treatment: Gait belt Activity Tolerance: Patient limited by fatigue;Patient limited by pain Patient left: in chair;with call bell/phone within reach Nurse Communication: Mobility status PT Visit Diagnosis: Unsteadiness on feet (R26.81);Muscle weakness (generalized) (M62.81);History of falling (Z91.81)     Time: 7564-3329 PT Time Calculation (min) (ACUTE ONLY): 26 min  Charges:  $Gait Training: 8-22 mins $Therapeutic Exercise: 8-22 mins              Ramond Dial 06/19/2020, 7:56 PM Mee Hives, PT MS Acute Rehab Dept. Number: [place] [facility] [place]

## 2020-06-19 NOTE — Progress Notes (Signed)
Colostomy changed and noted a black discoloration on the left side of the stoma, PA made aware(kelly). Per PA she will addressed it .

## 2020-06-20 LAB — BASIC METABOLIC PANEL
Anion gap: 6 (ref 5–15)
BUN: 9 mg/dL (ref 8–23)
CO2: 27 mmol/L (ref 22–32)
Calcium: 8.2 mg/dL — ABNORMAL LOW (ref 8.9–10.3)
Chloride: 101 mmol/L (ref 98–111)
Creatinine, Ser: 0.41 mg/dL — ABNORMAL LOW (ref 0.61–1.24)
GFR, Estimated: >60 mL/min (ref >60)
Glucose, Bld: 103 mg/dL — ABNORMAL HIGH (ref 70–99)
Potassium: 4.3 mmol/L (ref 3.5–5.1)
Sodium: 134 mmol/L — ABNORMAL LOW (ref 135–145)

## 2020-06-20 LAB — MAGNESIUM: Magnesium: 1.9 mg/dL (ref 1.7–2.4)

## 2020-06-20 MED ORDER — THIAMINE HCL 100 MG PO TABS
100.0000 mg | ORAL_TABLET | Freq: Every day | ORAL | Status: DC
  Administered 2020-06-20: 100 mg via ORAL
  Filled 2020-06-20: qty 1

## 2020-06-20 MED ORDER — PANTOPRAZOLE SODIUM 40 MG PO TBEC
40.0000 mg | DELAYED_RELEASE_TABLET | Freq: Two times a day (BID) | ORAL | Status: DC
  Administered 2020-06-20 – 2020-06-21 (×2): 40 mg via ORAL
  Filled 2020-06-20 (×2): qty 1

## 2020-06-20 MED ORDER — ADULT MULTIVITAMIN W/MINERALS CH
1.0000 | ORAL_TABLET | Freq: Every day | ORAL | Status: DC
  Administered 2020-06-20: 1 via ORAL
  Filled 2020-06-20: qty 1

## 2020-06-20 MED ORDER — MAGNESIUM SULFATE 2 GM/50ML IV SOLN
2.0000 g | Freq: Once | INTRAVENOUS | Status: AC
  Administered 2020-06-20: 2 g via INTRAVENOUS
  Filled 2020-06-20: qty 50

## 2020-06-20 MED ORDER — AMOXICILLIN-POT CLAVULANATE ER 1000-62.5 MG PO TB12
2.0000 | ORAL_TABLET | Freq: Two times a day (BID) | ORAL | Status: DC
  Administered 2020-06-20 – 2020-06-21 (×2): 2 via ORAL
  Filled 2020-06-20 (×4): qty 2

## 2020-06-20 NOTE — Progress Notes (Addendum)
PHARMACY - TOTAL PARENTERAL NUTRITION CONSULT NOTE  Indication: Prolonged ileus (20 days post-op)  Patient Measurements: Height: 5\' 11"  (180.3 cm) Weight: 60.1 kg (132 lb 7.9 oz) IBW/kg (Calculated) : 75.3 TPN AdjBW (KG): 56.8 Body mass index is 18.48 kg/m. Usual Weight: ~59kg per chart, ~62kg per patient   Assessment:  55 YOM with rectosigmoid colon cancer s/p LAR with end colostomy 05/30/20. Last standing weight was 54.9kg on 4/6 indicating about 5kg weight loss over 8 months. Patient had multiple episodes of n/v yesterday but none reported this morning. Has refused NG tube multiple times. Patient previously on TPN which was weaned 06/10/2020. Pt tolerated full liquid diet x1 day, attempted regular diet x1 meal and had 2 emesis episodes. Pharmacy consulted for TPN. Patient now tolerated clear liquid diet and plans to progress to full liquid diet today.   Glucose / Insulin: no hx DM - AM glucose WNL.  Not on SSI. Electrolytes: K 4.3 (goal >/= 4), Na down 134 (max Na in TPN), Mag 2.0 (goal >/= 2), CoCa 10.2, others WNL Renal: SCr < 1, BUN WNL Hepatic: LFTs / tbili / TG WNL, albumin 1.5, prealbumin 5.6>9.3  Intake / Output; MIVF: UOP 1.1 ml/kg/hr, colostomy O/P 550mL, NG tube clamped 4/25; net -24.6L since admit GI Imaging: 4/9 Abd XR: Moderate small bowel and gastric distention likely indicates post-op ileus. 4/11 CT abd: fluid filled SB consistent w/ postoperative ileus 4/12 Abd XR: NGT placed 4/18 CT abd: SBO in mid-ileum 4/19 Abd XR: NG tube tip proximal stomach placement 4/21 Abd XR: Markedly dilated small bowel consistent with bowel obstruction  GI Surgeries / Procedures:  4/6 robotic assisted LAR with end colostomy   Central access:  Double lumen R-brachial PICC placed 4/19 TPN start date: 4/12-4/17; restart 4/19  Nutritional Goals (per RD rec on 4/20): kCal: 2000-2200, Protein: 100-115g, Fluid: >/=2 L  Current Nutrition:  TPN + full liquid diet  Ensure TID - x1 given on  4/26  Plan: Reduce TPN rate to 36ml/hr and discontinue TPN at 1800 Discontinue TPN per primary - encourage PO intake  Mag 2g IV x1  Oral multivitamin with minerals daily  Oral thiamine 100mg  daily Oral protonix 40mg  BID   Wilson Singer, PharmD PGY1 Pharmacy Resident 06/20/2020 7:16 AM

## 2020-06-20 NOTE — Discharge Instructions (Addendum)
Colostomy Surgery, Adult, Care After  This sheet gives you information about how to care for yourself after your procedure. Your health care provider may also give you more specific instructions. If you have problems or questions, contact your health care provider. What can I expect after the procedure? After the procedure, it is common to have:  Swelling at the opening that was created during the procedure (stoma).  Slight bleeding around the stoma.  Redness around the stoma. Follow these instructions at home: Activity  Rest as needed while the stoma area heals.  Return to your normal activities as told by your health care provider. Ask your health care provider what activities are safe for you.  Avoid strenuous activity and abdominal exercises for 3 weeks or for as long as told by your health care provider.  Do not lift anything that is heavier than 10 lb (4.5 kg), or the limit that you are told, until your health care provider says that it is safe. Incision care  Follow instructions from your health care provider about how to take care of your incision. Make sure you: ? Wash your hands with soap and water before you change your bandage (dressing). If soap and water are not available, use hand sanitizer. ? Change your dressing as told by your health care provider. ? Leave stitches (sutures), skin glue, or adhesive strips in place. These skin closures may need to stay in place for 2 weeks or longer. If adhesive strip edges start to loosen and curl up, you may trim the loose edges. Do not remove adhesive strips completely unless your health care provider tells you to do that. Stoma care  Keep the stoma area clean.  Clean and dry the skin around the stoma each time you change the colostomy bag. To clean the stoma area: ? Use warm water and only use cleansers that are recommended by your health care provider. ? Rinse the stoma area with plain water. ? Dry the area well.  Use stoma  powder or skin barrier film on your skin only as told by your health care provider. Do not use any other powders, gels, wipes, or creams on the skin around the stoma.  Check the stoma area every day for signs of infection. Check for: ? More redness, swelling, or pain. ? More fluid or blood. ? Pus or warmth.  Measure the stoma opening regularly and record the size. Watch for changes. Share this information with your health care provider. Bathing  Do not take baths, swim, or use a hot tub until your health care provider approves. Ask your health care provider if you may take showers. You may be able to shower with or without the colostomy bag in place. If you bathe with the bag on, dry the bag afterward.  Avoid using harsh or oily soaps when you bathe. Colostomy bag care  Follow instructions from your health care provider about how to empty or change the colostomy bag.  Keep colostomy supplies with you at all times.  Store all supplies in a cool, dry place.  Empty the colostomy bag: ? Whenever it is one-third to one-half full. ? At bedtime.  Replace the bag every 3-4 days for the first 6 weeks, then every 4-7 days. Driving  Follow driving restrictions as told by your health care provider.  Do not drive or use heavy machinery while taking prescription pain medicine. General instructions  Follow instructions from your health care provider about eating or drinking restrictions.  Take over-the-counter and prescription medicines only as told by your health care provider.  Avoid wearing clothes that are tight directly over your stoma area.  Do not use any products that contain nicotine or tobacco, such as cigarettes and e-cigarettes. If you need help quitting, ask your health care provider.  If you are a woman, ask your health care provider about becoming pregnant and about using birth control. Medicines may not be absorbed normally after the procedure.  Keep all follow-up visits  as told by your health care provider. This is important. Contact a health care provider if you have:  Trouble caring for your stoma or changing the colostomy bag.  Nausea or vomiting.  A fever.  More redness, swelling, or pain at the site of your stoma or around your anus.  More fluid or blood coming from your stoma or your anus.  Warmth around your stoma area.  Pus coming from your stoma.  A change in the size or appearance of the stoma.  Abdominal pain, bloating, pressure, or cramping.  Stool more often or less often than your health care provider tells you to expect.  Very little urine production. This may be a sign of dehydration. Get help right away if you have:  Abdominal pain that does not go away or becomes severe.  Frequent vomiting.  No stool draining through the stoma.  Chest pain or an irregular heartbeat. Summary  Follow instructions from your health care provider about how to take care of your incision and stoma.  Contact a health care provider if you have trouble caring for your stoma or changing the colostomy bag.  Get help right away if you have abdominal pain that does not go away or becomes severe or if you have no stool draining through the stoma.  Keep all follow-up visits as told by your health care provider. This is important. This information is not intended to replace advice given to you by your health care provider. Make sure you discuss any questions you have with your health care provider. Document Revised: 06/09/2017 Document Reviewed: 06/09/2017 Elsevier Patient Education  2021 [place], Adult  Colostomy surgery is done to create an opening in the front of the abdomen for stool (feces) to leave the body through an ostomy (stoma). Part of the large intestine is attached to the stoma. A bag, also called a pouch, is fitted over the stoma. Stool and gas will collect in the bag. After surgery, you will need to  empty and change your colostomy bag as needed. You will also need to care for your stoma. How to care for the stoma Your stoma should look pink, red, and moist, like the inside of your cheek. Soon after surgery, the stoma may be swollen, but this swelling will go away within 6 weeks. To care for the stoma:  Keep the skin around the stoma clean and dry.  Use a clean, soft washcloth to gently wash the stoma and the skin around it. Clean using a circular motion, and wipe away from the stoma opening, not toward it. ? Use warm water and only use cleansers recommended by your health care provider. ? Rinse the stoma area with plain water. ? Dry the area around the stoma well.  Use stoma powder or ointment on your skin only as told by your health care provider. Do not use any other powders, gels, wipes, or creams on the skin around the stoma.  Check the stoma  area every day for signs of infection. Check for: ? New or worsening redness, swelling, or pain. ? New or increased fluid or blood. ? Pus or warmth.  Measure the stoma opening regularly and record the size. Watch for changes. (It is normal for the stoma to get smaller as swelling goes away.) Share this information with your health care provider. How to empty the colostomy bag Empty your bag at bedtime and whenever it is one-third to one-half full. Do not let the bag get more than half-full with stool or gas. The bag could leak if it gets too full. Some colostomy bags have a built-in gas release valve that releases gas often throughout the day. Follow these basic steps: 1. Wash your hands with soap and water. 2. Sit far back on the toilet seat. 3. Put several pieces of toilet paper into the toilet water. This will prevent splashing as you empty stool into the toilet. 4. Remove the clip or the hook-and-loop fastener from the tail end of the bag. 5. Unroll the tail, then empty the stool into the toilet. 6. Clean the tail with toilet paper or a  moist towelette. 7. Reroll the tail, and close it with the clip or the hook-and-loop fastener. 8. Wash your hands again.   How to change the colostomy bag Change your bag every 3-4 days or as often as told by your health care provider. Also change the bag if it is leaking or separating from the skin, or if your skin around the stoma looks or feels irritated. Irritated skin may be a sign that the bag is leaking. Always have colostomy supplies with you, and follow these basic steps: 1. Wash your hands with soap and water. Have paper towels or tissues nearby to clean any discharge. 2. Remove the old bag and skin barrier. Use your fingers or a warm cloth to gently push the skin away from the barrier. 3. Clean the stoma area with water or with mild soap and water, as directed. Use water to rinse away any soap. 4. Dry the skin. You may use the cool setting on a hair dryer to do this. 5. Use a tracing pattern (template) to cut the skin barrier to the size needed. 6. If you are using a two-piece bag, attach the bag and the skin barrier to each other. Add the barrier ring, if you use one. 7. If directed, apply stoma powder or skin barrier gel to the skin. 8. Warm the skin barrier with your hands, or blow with a hair dryer for 5-10 seconds. 9. Remove the paper from the adhesive strip of the skin barrier. 10. Press the adhesive strip onto the skin around the stoma. 11. Gently rub the skin barrier onto the skin. This creates heat that helps the barrier to stick. 12. Apply stoma tape to the edges of the skin barrier, if desired. 80. Wash your hands again. General recommendations  Avoid wearing tight clothes or having anything press directly on your stoma or bag. Change your clothing whenever it is soiled or damp.  You may shower or bathe with the bag on or off. Do not use harsh or oily soaps or lotions. Dry the skin and bag after bathing.  Store all supplies in a cool, dry place. Do not leave supplies in  extreme heat because some parts can melt or not stick as well.  Whenever you leave home, take extra clothing and an extra skin barrier and bag with you.  If your  bag gets wet, you can dry it with a hair dryer on the cool setting.  To prevent odor, you may put drops of ostomy deodorizer in the bag.  If recommended by your health care provider, put ostomy lubricant inside the bag. This helps stool to slide out of the bag more easily and completely. Contact a health care provider if:  You have new or worsening redness, swelling, or pain around your stoma.  You have new or increased fluid or blood coming from your stoma.  Your stoma feels warm to the touch.  You have pus coming from your stoma.  Your stoma extends in or out farther than normal.  You need to change your bag every day.  You have a fever. Get help right away if:  Your stool is bloody.  You have nausea or you vomit.  You have trouble breathing. Summary  Measure your stoma opening regularly and record the size. Watch for changes.  Empty your bag at bedtime and whenever it is one-third to one-half full. Do not let the bag get more than half-full with stool or gas.  Change your bag every 3-4 days or as often as told by your health care provider.  Whenever you leave home, take extra clothing and an extra skin barrier and bag with you. This information is not intended to replace advice given to you by your health care provider. Make sure you discuss any questions you have with your health care provider. Document Revised: 10/13/2019 Document Reviewed: 10/13/2019 Elsevier Patient Education  2021 [place].   Full Liquid Diet A full liquid diet refers to fluids and foods that are liquid, or will become liquid, at room temperature. This diet should only be used for a short period of time to help you recover from illness or surgery. Your health care provider or dietitian will help determine when it is safe to eat  regular foods again. What are tips for following this plan? Reading food labels  Check food labels of nutrition shakes for the amount of protein. Look for nutrition shakes that have at least 8-10 grams of protein in each serving.  Choose drinks, such as milks and juices, that are "fortified" or "enriched." This means that vitamins and minerals have been added. Shopping  Buy pre-made nutrition shakes to keep on hand.  To vary your choices, buy different flavors of milks and shakes. Meal planning  Choose flavors and foods that you enjoy.  To make sure you get enough energy and calories from food: ? Have three full liquid meals each day. Have a liquid snack between each meal. ? Drink 6-8 oz (177-237 mL) of a nutritional supplement shake with meals or as snacks. ? Add protein powder, powdered milk, milk, or yogurt to shakes to increase the amount of protein.  Drink at least one serving a day of citrus fruit juice or fruit juice that has vitamin C added. General guidelines  Before starting the full liquid diet, check with your health care provider to know what foods you should avoid. These may include full-fat or high-fiber liquids.  You may have any liquid or food that becomes a liquid at room temperature. The food is considered a liquid if it can be poured off a spoon at room temperature.  Do not drink alcohol unless approved by your health care provider.  This diet gives you most of the nutrients that you need for energy, but you may not get enough of certain vitamins, minerals, and fiber.  Make sure to talk to your health care provider or dietitian about: ? How many calories you need to eat each day. ? How much fluid you should have each day. ? Taking a multivitamin or a nutritional supplement. What foods should I eat? Fruits Fruit juice without pulp. Strained fruit pures (seeds and skins removed). Vegetables Pulp-free tomato or vegetable juice. Vegetables pured in  soup. Grains Thin, hot cereal, such as farina. Soft-cooked pasta or rice pured in soup. Meats and other proteins Beef, chicken, and fish broths. Powdered protein supplements. Dairy Milk and milk-based beverages, including milk shakes and instant breakfast mixes. Smooth yogurt. Pured cottage cheese. Fats and oils Melted margarine and butter. Cream. [place], almond, avocado, corn, grapeseed, sunflower, and sesame oils. Gravy. Beverages Water. Coffee and tea (caffeinated or decaffeinated). Cocoa. Liquid nutritional supplements. Soft drinks. Nondairy milks, such as almond, coconut, rice, or soy milk. Sweets and desserts Custard. Pudding. Flavored gelatin. Smooth ice cream (without nuts or candy pieces). Sherbet. Frozen ice pops. [place] ice. Pudding pops. Seasonings and condiments Salt and pepper. Spices. Vinegar. Ketchup. Yellow mustard. Smooth sauces, such as [place]ise, cheese sauce, or white sauce. Soy sauce. Syrup. Honey. Jelly (without fruit pieces). Other foods Cocoa powder. Cream soups. Strained soups. The items listed above may not be a complete list of foods and beverages you can eat. Contact a dietitian for more information.   What foods should I avoid? Fruits All whole fresh, frozen, or canned fruits. Vegetables All whole fresh, frozen, or canned vegetables. Grains Whole grains. Pasta. Rice. Cold cereal. Bread. Crackers. Meats and other proteins All cuts of meat, poultry, and fish. Eggs. Tofu and soy protein. Nuts and nut butters. Precooked or cured meat, such as sausages or meat loaves. Dairy Hard cheese. Yogurt with fruit chunks. Fats and oils Coconut oil. Palm oil. Lard. Cold butter. Sweets and desserts Ice cream or other frozen desserts that contain solids, such as nuts, chocolate chips, and pieces of cookies. Cakes. Cookies. Candy. Seasonings and condiments Stone-ground mustard. Other foods Soups with chunks or pieces. The items listed above may not be a complete  list of foods and beverages you should avoid. Contact a dietitian for more information. Summary  A full liquid diet refers to fluids and foods that are liquid or will become liquid at room temperature.  This diet should only be used for a short period of time to help you recover from illness or surgery. Ask your health care provider or dietitian when it is safe for you to eat regular foods.  To make sure you get enough calories and nutrients, eat three meals each day with snacks in between. Drink pre-made nutritional supplement shakes or add protein powder to homemade shakes. Talk to your health care provider about taking a vitamin and mineral supplement. This information is not intended to replace advice given to you by your health care provider. Make sure you discuss any questions you have with your health care provider. Document Revised: 11/29/2019 Document Reviewed: 11/29/2019 Elsevier Patient Education  2021 [place].   Dysphagia Eating Plan, Pureed This eating plan is helpful for people with moderate to severe swallowing problems. Pureed foods are smooth and are prepared without lumps so that they can be swallowed safely. Work with your health care provider and your diet and nutrition specialist (dietitian) to make sure that you are following the eating plan safely and getting all the nutrients you need. What are tips for following this plan? General information  You may eat foods that  are soft and have a pudding-like texture.  Do not eat foods that you have to chew. If you have to chew the food, then you cannot eat it.  Avoid foods that are hard, dry, sticky, chunky, lumpy, or stringy. Also avoid foods with nuts, seeds, raisins, skins, or pulp.  You may be instructed to thicken liquids. Follow your health care provider's instructions about how to do this and to what consistency.  The foods you may eat are usually eaten with a spoon.  You can use a spoon or fork to test the  texture of a food: ? Using a spoon, you can do a spoon tilt test to check if food holds together on the spoon and is not too firm, too sticky, or too thick. Food should hold its shape on the spoon and slide off easily with almost no food left on the spoon. ? With food on a fork, the food should sit on a mound or pile on top of the fork and should not seep or drip through the fork prongs continuously. Cooking  If a food is not originally a smooth texture, you may be able to eat the food after: ? Pureeing it. This can be done with a blender. ? Moistening it. This can be done by adding juice, cooking liquid, gravy, or sauce to a dry food and then pureeing it. For example, you may have bread if you soak it in milk and puree it.  If a food is too thin, you may add a commercial thickener, corn starch, rice cereal, or potato flakes to thicken it.  Strain and throw away any excess liquid or liquid that separates from a solid pureed food before eating.  Strain lumps, chunks, pulp, and seeds from pureed foods before eating.  Reheat foods slowly to prevent a tough crust from forming.   Meal planning  Eat a variety of foods to get all the nutrients you need.  Add dry milk or protein powder to food to increase calories and protein content.  Follow your meal plan as told by your dietitian. What foods can I eat? Grains Soft breads, pancakes, [place] toast, muffins, and bread stuffing pureed to a smooth, moist texture, without nuts or seeds. Cooked cereals that have a pudding-like consistency, such as hot wheat cereal or farina. Pureed oatmeal. Pureed, well-cooked pasta and rice. Fruits Pureed fruits such as melons and apples without seeds or pulp. Mashed bananas. Mashed avocado. Fruit juices without pulp or seeds. Vegetables Pureed vegetables. Smooth tomato paste or sauce. Mashed or pureed potatoes without skin. Meats and other proteins Pureed meat, poultry, and fish. Smooth pate or liverwurst. Smooth  souffles. Pureed beans (such as lentils). Pureed eggs. Smooth nut and seed butters. Pureed tofu. Dairy Yogurt. Milk. Pureed cottage cheese. Nutritional dairy drinks or shakes. Cream cheese. Smooth pudding, ice cream, sherbet, and malts. Fats and oils Butter. Margarine. Vegetable oils. Smooth and strained gravy. Sour cream. Mayonnaise. Smooth sauces such as white sauce, cheese sauce, or hollandaise sauce. Sweets and desserts Moistened and pureed cookies and cakes. Whipped topping. Gelatin. Pudding pops. Seasonings and other foods Finely ground spices. Jelly. Honey. Pureed casseroles. Strained soups. Pureed sandwiches. Beverages Anything prepared at the consistency recommended by your dietitian. The items listed above may not be a complete list of foods and beverages you can eat. Contact a dietitian for more information. What foods must I avoid? Grains Oatmeal. Dry cereals. Hard breads. Breads with seeds or nuts. Whole pasta, rice, or other grains. Whole  pancakes, waffles, biscuits, muffins, or rolls. Fruits Whole fresh, frozen, canned, or dried fruits that have not been pureed. Stringy fruits, such as pineapple or coconut. Watermelon with seeds. Dried fruit or fruit leather. Vegetables Whole vegetables. Stringy vegetables (such as celery). Tomatoes or tomato sauce with seeds. Fried vegetables. Meats and other proteins Whole or ground meat, fish, or poultry. Dried or cooked lentils or legumes that have been cooked but not mashed or pureed. Non-pureed eggs. Nuts and seeds. Crunchy peanut butter. Whole tofu or other meat alternatives. Dairy Cheese cubes or slices. Non-pureed cottage cheese. Yogurt with fruit chunks. Fats and oils All fats and sauces that have lumps or chunks. Sweets and desserts Solid desserts. Sticky, chewy sweets (such as licorice and caramel). Candy with nuts or coconut. Seasonings and other foods Coarse or seeded herbs and spices. Chunky preserves. Jams with seeds. Whole  sandwiches. Non-pureed casseroles. Chunky soups. The items listed above may not be a complete list of foods and beverages you should avoid. Contact a dietitian for more information. Summary  Pureed foods can be helpful for people with moderate to severe swallowing problems.  On this dysphagia eating plan, you may eat foods that are soft and have a pudding-like texture. Do not eat foods that you have to chew. If you have to chew the food, then you cannot eat it.  You may be instructed to thicken liquids. Follow your health care provider's instructions about how to do this and to what consistency. This information is not intended to replace advice given to you by your health care provider. Make sure you discuss any questions you have with your health care provider. Document Revised: 10/31/2019 Document Reviewed: 10/31/2019 Elsevier Patient Education  2021 [employer].

## 2020-06-20 NOTE — Progress Notes (Signed)
HD#26 Subjective:  Overnight Events: no event   Patient is seen at bedside with Margie Billet, PA of surgery.  Patient tolerated his diet okay yesterday.  He states that his abdomen is less distended.   Objective:  Vital signs in last 24 hours: Vitals:   06/19/20 0404 06/19/20 1444 06/19/20 2053 06/20/20 0354  BP: 119/83 (!) 141/76 119/77 120/77  Pulse: 97 (!) 102 98 98  Resp: 17  18 18   Temp: 97.6 F (36.4 C) 98.1 F (36.7 C) 98.3 F (36.8 C) 97.6 F (36.4 C)  TempSrc: Oral Oral Oral Oral  SpO2: 97% 98% 97% 97%  Weight:    60.1 kg  Height:       Supplemental O2: Room Air SpO2: 97 % O2 Flow Rate (L/min): 6 L/min   Physical Exam:  Physical Exam Constitutional:      General: He is not in acute distress.    Appearance: He is not toxic-appearing.  Eyes:     General: No scleral icterus.       Right eye: No discharge.        Left eye: No discharge.     Conjunctiva/sclera: Conjunctivae normal.  Abdominal:     Comments: Good output seen in the ostomy bag.  His abdomen is less distended.  No pain to palpation.  Neurological:     Mental Status: He is alert.  Psychiatric:        Mood and Affect: Mood normal.        Thought Content: Thought content normal.        Judgment: Judgment normal.     Filed Weights   06/14/20 0404 06/15/20 0401 06/20/20 0354  Weight: 59.5 kg 55 kg 60.1 kg     Intake/Output Summary (Last 24 hours) at 06/20/2020 0701 Last data filed at 06/20/2020 0531 Gross per 24 hour  Intake 480 ml  Output 2175 ml  Net -1695 ml   Net IO Since Admission: -24,649.51 mL [06/20/20 0701]  Pertinent Labs: CBC Latest Ref Rng & Units 06/18/2020 06/14/2020 06/13/2020  WBC 4.0 - 10.5 K/uL 5.8 8.2 9.1  Hemoglobin 13.0 - 17.0 g/dL 8.5(L) 8.5(L) 8.5(L)  Hematocrit 39.0 - 52.0 % 28.4(L) 28.2(L) 28.1(L)  Platelets 150 - 400 K/uL 364 378 399    CMP Latest Ref Rng & Units 06/20/2020 06/19/2020 06/18/2020  Glucose 70 - 99 mg/dL 103(H) 110(H) 121(H)  BUN 8 - 23 mg/dL  9 10 12   Creatinine 0.61 - 1.24 mg/dL 0.41(L) 0.36(L) 0.33(L)  Sodium 135 - 145 mmol/L 134(L) 134(L) 134(L)  Potassium 3.5 - 5.1 mmol/L 4.3 4.1 3.8  Chloride 98 - 111 mmol/L 101 104 104  CO2 22 - 32 mmol/L 27 27 24   Calcium 8.9 - 10.3 mg/dL 8.2(L) 7.8(L) 7.6(L)  Total Protein 6.5 - 8.1 g/dL - - 5.1(L)  Total Bilirubin 0.3 - 1.2 mg/dL - - 0.3  Alkaline Phos 38 - 126 U/L - - 93  AST 15 - 41 U/L - - 19  ALT 0 - 44 U/L - - 18    Imaging: No results found.  Assessment/Plan:   Principal Problem:   sigmoid colon cancer Active Problems:   Symptomatic anemia   Iron deficiency anemia due to chronic blood loss   Duodenal ulcer   Gastritis and gastroduodenitis   Gastroesophageal reflux disease with esophagitis without hemorrhage   Esophageal stricture   Protein-calorie malnutrition, severe   Aortic atherosclerosis (HCC)   Ileus following gastrointestinal surgery (HCC)   Hyponatremia  Patient Summary: Patient is 65 yo gentlemanwith past medical history of questionable COPD,past left5,6,8ribs fracture who presented to the ED for dizziness, dyspnea with exertion, due to symptomatic anemia, found to have duodenal ulcer and acolorectal masss/p resection. Currently managing post-op ileusand SBO.  Colorectal mass(T3B, N0, Mx) Post-op Ileus-improving Patient tolerated full liquid diet well.    Good output seen in the ostomy bag.  Per surgery, will monitor him for the next 24 hours.  If he does well, will discharge home with full liquid and advance his diet slowly.  TPN was discontinued. -Painmanagement scheduled Tylenol and oxycodone as needed.  Only required 1 dose of oxycodone yesterday -Noadjuvant chemotherapy per oncology.Follow-up with oncology in 1-14months  Non-bleeding duodenal ulcer Grade B esophagitis without bleeding, gastritis s/p bx, moderate esophageal stenosis s/p dilation Inthe context of excessive NSAID use, heavy alcohol use and smoking - Continue  sucralfate - continue protonix 40mg BID(will need10w, then reduce to 40mg  daily) - gastric pathologynegative for H. Pylori  Iron deficiencyanemia2/2 GI bleed. Hemoglobin is trending down slowly, likely from phlebotomy - Startoral iron supplementwhen he can tolerate p.o.  Alcoholuse disorder -Holdthiamine and multivitamin per RD  New lung densitieswhich are questioned to be post inflammatory vs atelectasis.  -can repeatimaging outpatient to reevaluate  Diet:Full liquid diet  IVF:NA FUX:NATFTDDUKG Code:Full PT/OT recs:Home Health,walker. TOC recs:homehealth PT/OT  Dispo: Anticipated discharge toHomein1 days pendingPost-op ileus and SBO management.  Gaylan Gerold, DO 06/20/2020, 7:01 AM Pager: [phone]  Please contact the on call pager after 5 pm and on weekends at [phone].

## 2020-06-20 NOTE — Progress Notes (Addendum)
At [place], watched pt burp ostomy bag successfully.  Pt stated he had been itching his stomach where a drain had been a few days ago. This morning the area was firm and had scabbing in two places. This evening it is red, warm, and still firm between the scabbing. The scratch marks on his skin from itching were more midline than over the scabbed areas.  Passed on in report to nightshift RN. She circled the area with a sharpie to monitor. Called Dr. Lyman Bishop. New orders for augmentin.

## 2020-06-20 NOTE — Progress Notes (Signed)
[facility] Progress Note  21 Days Post-Op  Subjective: CC-  Feels the same as yesterday. Denies worsening abdominal pain, nausea, or vomiting. He had some bloating after PO intake yesterday but it resolved with time and he never felt nauseated. Colostomy productive.  Objective: Vital signs in last 24 hours: Temp:  [97.6 F (36.4 C)-98.3 F (36.8 C)] 97.6 F (36.4 C) (04/27 0354) Pulse Rate:  [98-102] 98 (04/27 0354) Resp:  [18] 18 (04/27 0354) BP: (119-141)/(76-77) 120/77 (04/27 0354) SpO2:  [97 %-98 %] 97 % (04/27 0354) Weight:  [60.1 kg] 60.1 kg (04/27 0354) Last BM Date: 06/19/20  Intake/Output from previous day: 04/26 0701 - 04/27 0700 In: 480 [P.O.:480] Out: 2205 [Urine:1650; Stool:555] Intake/Output this shift: Total I/O In: -  Out: 150 [Urine:150]  PE: Gen: Alert, NAD Pulm: Normal rate and efforton room air UUV:OZDG,[id number],IEPPIRJJO. Incisions c/d/i. Stoma pink, budded and viable. Colostomy bagliquid stooland airin pouch   Lab Results:  Recent Labs    06/18/20 0341  WBC 5.8  HGB 8.5*  HCT 28.4*  PLT 364   BMET Recent Labs    06/19/20 0318 06/20/20 0405  NA 134* 134*  K 4.1 4.3  CL 104 101  CO2 27 27  GLUCOSE 110* 103*  BUN 10 9  CREATININE 0.36* 0.41*  CALCIUM 7.8* 8.2*   PT/INR No results for input(s): LABPROT, INR in the last 72 hours. CMP     Component Value Date/Time   NA 134 (L) 06/20/2020 0405   K 4.3 06/20/2020 0405   CL 101 06/20/2020 0405   CO2 27 06/20/2020 0405   GLUCOSE 103 (H) 06/20/2020 0405   BUN 9 06/20/2020 0405   CREATININE 0.41 (L) 06/20/2020 0405   CALCIUM 8.2 (L) 06/20/2020 0405   PROT 5.1 (L) 06/18/2020 0341   ALBUMIN 1.5 (L) 06/18/2020 0341   AST 19 06/18/2020 0341   ALT 18 06/18/2020 0341   ALKPHOS 93 06/18/2020 0341   BILITOT 0.3 06/18/2020 0341   GFRNONAA >60 06/20/2020 0405   GFRAA >60 09/09/2019 0207   Lipase  No results found for: LIPASE     Studies/Results: No  results found.  Anti-infectives: Anti-infectives (From admission, onward)   Start     Dose/Rate Route Frequency Ordered Stop   05/30/20 0600  cefoTEtan (CEFOTAN) 2 g in sodium chloride 0.9 % 100 mL IVPB        2 g 200 mL/hr over 30 Minutes Intravenous To Surgery 05/29/20 1430 05/30/20 1745   05/29/20 1400  neomycin (MYCIFRADIN) tablet 1,000 mg       "And" Linked Group Details   1,000 mg Oral 3 times per day 05/29/20 1152 05/29/20 2228   05/29/20 1400  metroNIDAZOLE (FLAGYL) tablet 1,000 mg       "And" Linked Group Details   1,000 mg Oral 3 times per day 05/29/20 1152 05/29/20 2228       Assessment/Plan COPD- PRN nebs Non-bleeding duodenal ulcer  Grade B esophogitis, gastritis, esophageal stenosis s/p dilation 4/2 Grade II internal hemorrhoids  Alcohol use disorder History of tobacco use ABL anemia- hgb stableat8.5 (4/25). Dr. Burr Medico recommendedstarting him on ferrous sulfate 325 mg twice dailywhen ileus resolves.  Protein calorie malnutrition- Pre-alb 8.0(4/11)>> 9.3 (4/25).d/c TPN 4/27  Rectosigmoid colon cancer S/P robotic assisted LAR with end colostomy 05/30/20 Dr. Dema Severin -POD#21 -Path=T3N0 adenocarcinoma.Seen byDr. Allena Napoleon arrange follow up. - WOC following for colostomy teaching -removedJP4/16 -CT scan 4/11 w/ ileus. No postoperative fluid collection or other complication noted. - CT scan  4/18 with possible SBO and transition in the mid ileum, no evidence of intraabdominal abscess - tolerating full liquids and continues to have bowel function - D/c TPN today. Continue full liquid diet and bowel regimen. If he tolerates this over the next 24 hours ok for discharge home on liquid/puree diet. He would like to work with [facility] again before he goes.  FEN:FLD, Boost VTE: SCDs,Lovenox ID: neomycin/flagyl 4/5, cefotetan 4/6. None currently Foley: removed POD1. Voiding. Follow-Up- Dr. Dema Severin and Dr. Burr Medico    LOS: 19 days    Wellington Hampshire,  [facility] 06/20/2020, 8:37 AM Please see Amion for pager number during day hours 7:00am-4:30pm

## 2020-06-20 NOTE — Consult Note (Signed)
[place] Nurse ostomy follow up Patient's son, known as "Little [name]" is requesting a session with a [place] nurse tomorrow.  He will be in the room from [phone], and must leave for work no later than 0900.  I have notified K. Baird Cancer, [place] nurse of his request, and with his permission, have given her his phone number, [phone]. See my note from 06/07/20 of the son's ability to perform ostomy care. Val Riles, RN, MSN, CWOCN, CNS-BC, pager [phone]

## 2020-06-21 ENCOUNTER — Other Ambulatory Visit (HOSPITAL_COMMUNITY): Payer: Self-pay

## 2020-06-21 LAB — CBC
HCT: 29.1 % — ABNORMAL LOW (ref 39.0–52.0)
Hemoglobin: 8.8 g/dL — ABNORMAL LOW (ref 13.0–17.0)
MCH: 27.2 pg (ref 26.0–34.0)
MCHC: 30.2 g/dL (ref 30.0–36.0)
MCV: 89.8 fL (ref 80.0–100.0)
Platelets: 355 10*3/uL (ref 150–400)
RBC: 3.24 MIL/uL — ABNORMAL LOW (ref 4.22–5.81)
RDW: 21.8 % — ABNORMAL HIGH (ref 11.5–15.5)
WBC: 5 10*3/uL (ref 4.0–10.5)
nRBC: 0 % (ref 0.0–0.2)

## 2020-06-21 MED ORDER — ENSURE ENLIVE PO LIQD
237.0000 mL | Freq: Three times a day (TID) | ORAL | 12 refills | Status: DC
  Filled 2020-06-21: qty 237, 1d supply, fill #0

## 2020-06-21 MED ORDER — DOCUSATE SODIUM 100 MG PO CAPS
100.0000 mg | ORAL_CAPSULE | Freq: Two times a day (BID) | ORAL | 0 refills | Status: DC
  Filled 2020-06-21: qty 60, 30d supply, fill #0

## 2020-06-21 MED ORDER — ACETAMINOPHEN 325 MG PO TABS
650.0000 mg | ORAL_TABLET | Freq: Four times a day (QID) | ORAL | 0 refills | Status: DC
  Filled 2020-06-21: qty 30, 4d supply, fill #0

## 2020-06-21 MED ORDER — ADULT MULTIVITAMIN W/MINERALS CH
1.0000 | ORAL_TABLET | Freq: Every day | ORAL | 0 refills | Status: DC
  Filled 2020-06-21: qty 30, 30d supply, fill #0

## 2020-06-21 MED ORDER — POLYETHYLENE GLYCOL 3350 17 GM/SCOOP PO POWD
17.0000 g | Freq: Every day | ORAL | 0 refills | Status: DC
  Filled 2020-06-21: qty 510, 30d supply, fill #0

## 2020-06-21 MED ORDER — PANTOPRAZOLE SODIUM 40 MG PO TBEC
40.0000 mg | DELAYED_RELEASE_TABLET | Freq: Two times a day (BID) | ORAL | 0 refills | Status: DC
  Filled 2020-06-21: qty 60, 30d supply, fill #0

## 2020-06-21 MED ORDER — THIAMINE HCL 100 MG PO TABS
100.0000 mg | ORAL_TABLET | Freq: Every day | ORAL | 0 refills | Status: DC
  Filled 2020-06-21: qty 30, 30d supply, fill #0

## 2020-06-21 MED ORDER — AMOXICILLIN-POT CLAVULANATE 875-125 MG PO TABS
1.0000 | ORAL_TABLET | Freq: Two times a day (BID) | ORAL | 0 refills | Status: DC
  Filled 2020-06-21: qty 10, 5d supply, fill #0

## 2020-06-21 MED ORDER — OXYCODONE HCL 5 MG PO TABS
5.0000 mg | ORAL_TABLET | ORAL | 0 refills | Status: DC | PRN
  Filled 2020-06-21: qty 30, 5d supply, fill #0

## 2020-06-21 MED ORDER — SUCRALFATE 1 G PO TABS
1.0000 g | ORAL_TABLET | Freq: Four times a day (QID) | ORAL | 0 refills | Status: DC
  Filled 2020-06-21: qty 120, 30d supply, fill #0

## 2020-06-21 NOTE — Progress Notes (Signed)
Physical Therapy Treatment Patient Details Name: Marc Mcdonald MRN: [id number] DOB: [birth year] Today's Date: 06/21/2020    History of Present Illness Pt adm 05/25/20 with dizziness, weakness, and dyspnea. Pt with Hgb of 5.5 and found to have duodenal ulcer and rectal mass. Oncology and surgery involved and staging of rectal CA in process. Pt had surgery on 4/7 to place an End Ostomy. CT 4/19 showed possible SBO with transition in the mid ileum; PMH - ?copd, lt rib fx's 08/2019, etoh    PT Comments    Pt initially irritable and refusing therapy. RN arrived and with max encouragement, pt agreed to participate with therapy. Once up and ambulating pt's attitude improved and he ambulated 1 lap around the unit prior to returning to supine in bed. Pt is expected to d/c home today. Current plan remains appropriate.    Follow Up Recommendations  Home health PT;Supervision for mobility/OOB     Equipment Recommendations  Rolling walker with 5" wheels    Recommendations for Other Services       Precautions / Restrictions Precautions Precautions: Fall    Mobility  Bed Mobility Overal bed mobility: Modified Independent                  Transfers Overall transfer level: Needs assistance Equipment used: Rolling walker (2 wheeled) Transfers: Sit to/from Stand Sit to Stand: Supervision         General transfer comment: supervision for safety. Heavy reliance on UEs to assist with transfer  Ambulation/Gait Ambulation/Gait assistance: Min guard Gait Distance (Feet): 500 Feet Assistive device: Rolling walker (2 wheeled) Gait Pattern/deviations: Step-through pattern;Decreased stride length Gait velocity: reduced slightly   General Gait Details: Frequent cues for RW proximity and safety.   Stairs             Wheelchair Mobility    Modified Rankin (Stroke Patients Only)       Balance Overall balance assessment: Needs assistance Sitting-balance support: Feet  supported Sitting balance-Leahy Scale: Good     Standing balance support: Bilateral upper extremity supported Standing balance-Leahy Scale: Fair Standing balance comment: RW is helpful support, tends to fatigue in legs with gait                            Cognition Arousal/Alertness: Awake/alert Behavior During Therapy: WFL for tasks assessed/performed Overall Cognitive Status: Within Functional Limits for tasks assessed                                 General Comments: Irritable today, requiring max encouragement to participate.      Exercises      General Comments        Pertinent Vitals/Pain Pain Assessment: Faces Faces Pain Scale: Hurts little more Pain Location: L side of trunk Pain Descriptors / Indicators: Grimacing;Guarding Pain Intervention(s): Monitored during session;Limited activity within patient's tolerance    Home Living                      Prior Function            PT Goals (current goals can now be found in the care plan section) Acute Rehab PT Goals Patient Stated Goal: go home PT Goal Formulation: With patient Time For Goal Achievement: 06/25/20 Potential to Achieve Goals: Fair Progress towards PT goals: Progressing toward goals    Frequency  Min 3X/week      PT Plan Current plan remains appropriate    Co-evaluation              AM-PAC PT "6 Clicks" Mobility   Outcome Measure  Help needed turning from your back to your side while in a flat bed without using bedrails?: None Help needed moving from lying on your back to sitting on the side of a flat bed without using bedrails?: None Help needed moving to and from a bed to a chair (including a wheelchair)?: A Little Help needed standing up from a chair using your arms (e.g., wheelchair or bedside chair)?: A Little Help needed to walk in hospital room?: A Little Help needed climbing 3-5 steps with a railing? : A Little 6 Click Score: 20     End of Session Equipment Utilized During Treatment: Gait belt Activity Tolerance: Patient tolerated treatment well Patient left: with call bell/phone within reach;in bed Nurse Communication: Mobility status PT Visit Diagnosis: Unsteadiness on feet (R26.81);Muscle weakness (generalized) (M62.81);History of falling (Z91.81)     Time: 2536-6440 PT Time Calculation (min) (ACUTE ONLY): 17 min  Charges:  $Gait Training: 8-22 mins                     Benjiman Core, [place] Pager [phone] Acute Rehab   Allena Katz 06/21/2020, 1:34 PM

## 2020-06-21 NOTE — Progress Notes (Signed)
Brief Nutrition Follow-up:  Pt with discharge orders today. TPN has been discontinued. Pt is to be discharge on Full Liquid Diet. Provided "Full Liquid Nutrition Therapy" handout from [employer] to patient. Reviewed importance of adequate nutrition and that options being limited on full liquid diet will make this more of challenge. Discussed ways to increase protein and calories while on FL diet. Encouraged patient to utilize a high calorie, high protein oral nutrition supplement TID while on FL diet. Pt verbalized understanding.   Kerman Passey MS, RDN, LDN, CNSC Registered Dietitian III Clinical Nutrition RD Pager and On-Call Pager Number Located in [place]

## 2020-06-21 NOTE — Consult Note (Signed)
[place] Nurse ostomy follow up Son to perform pouch change today.  Stoma type/location: LUQ colostomy Stomal assessment/size: oval, 3 cm x 5 cm raised, pink and moist Peristomal assessment: 0.1 cm round nonintact lesion at 3 o'clock.  Protected with barrier ring. Treatment options for stomal/peristomal skin: barrier ring and 2 piece flat pouch.  Output soft brown stool Ostomy pouching: 2pc. 2 3/4" pouch with barrier ring   Education provided: Son removed old pouch, cleansed skin and protected peristomal skin with barrier ring.  Applied pouch and barrier and rolled closed.  Discussed twice weekly pouch changes and emptying when 1/3 full.  Demonstrated how to empty pouch into toilet and encouraged patient to do this on his own. He will be home alone much during the day.  Enrolled patient in [facility] Start Discharge program: Yes kit has arrived.  Adding a filtered pouch to order so [facility] will send samples.  4 pouch sets in room for discharge.  Will not follow at this time.  Please re-consult if needed.  Domenic Moras MSN, RN, FNP-BC CWON Wound, Ostomy, Continence Nurse Pager [phone]

## 2020-06-21 NOTE — TOC Progression Note (Addendum)
Transition of Care [facility]) - Progression Note    Patient Details  Name: Marc Mcdonald MRN: [id number] Date of Birth: April 09, 1955  Transition of Care [facility] CM/SW Contact  Jacalyn Lefevre Edson Snowball, RN Phone Number: 06/21/2020, 11:26 AM  Clinical Narrative:     Patient discahrging today .   Per WOC note: Enrolled patient in [place] Start Discharge program: Yes kit has arrived.  Adding a filtered pouch to order so [facility] will send samples.  4 pouch sets in room for discharge.   Messaged [place] with [facility] to make aware of  discharge today.   Called [employer] for shower stool, and walker    Called PCP Dr Tennis Must [place] to schedule appointment, chose option 1 to make an appointment , then phone rings with no answer. Will try again later . cALLED X 4 SCHEDULED APPOINTMENT FOR Jun 26, 2020 AT 1:10 PM. oN avs    [facility]55 [employer] saying they delivered DME to patient's room, patient reporting they have not . [employer] checking with Keon with [employer] who documented DME was delivered . Keon coming to [place] regarding DME  Expected Discharge Plan: [place]    Expected Discharge Plan and Services Expected Discharge Plan: [place]   Discharge Planning Services: CM Consult Post Acute Care Choice: [place] arrangements for the past 2 months: Single Family Home Expected Discharge Date: 06/21/20                 DME Agency: NA       HH Arranged: RN           Social Determinants of Health (SDOH) Interventions    Readmission Risk Interventions No flowsheet data found.

## 2020-06-21 NOTE — Progress Notes (Signed)
Progress Note  22 Days Post-Op  Subjective: Patient still reports some bloating after PO intake but reports it goes down gradually after eating and mobilization helps. He would like to go home.   Objective: Vital signs in last 24 hours: Temp:  [97.3 F (36.3 C)-98 F (36.7 C)] 97.9 F (36.6 C) (04/28 0541) Pulse Rate:  [93-102] 97 (04/28 0541) Resp:  [17-18] 18 (04/28 0541) BP: (114-128)/(74-83) 116/83 (04/28 0541) SpO2:  [97 %-98 %] 97 % (04/28 0541) Last BM Date: 06/20/20  Intake/Output from previous day: 04/27 0701 - 04/28 0700 In: 440 [P.O.:440] Out: 2120 [Urine:1770; Stool:350] Intake/Output this shift: No intake/output data recorded.  PE: Gen: Alert, NAD Pulm: Normal rate and efforton room air ZYS:AYTK,[id number],RKYHCWCBJ. Incisions c/d/i. Stoma pink, budded and viable. Colostomy bagliquid stooland airin pouch. Small amount cellulitis around previous drain site but no abscess or fluctuance    Lab Results:  Recent Labs    06/21/20 0354  WBC 5.0  HGB 8.8*  HCT 29.1*  PLT 355   BMET Recent Labs    06/19/20 0318 06/20/20 0405  NA 134* 134*  K 4.1 4.3  CL 104 101  CO2 27 27  GLUCOSE 110* 103*  BUN 10 9  CREATININE 0.36* 0.41*  CALCIUM 7.8* 8.2*   PT/INR No results for input(s): LABPROT, INR in the last 72 hours. CMP     Component Value Date/Time   NA 134 (L) 06/20/2020 0405   K 4.3 06/20/2020 0405   CL 101 06/20/2020 0405   CO2 27 06/20/2020 0405   GLUCOSE 103 (H) 06/20/2020 0405   BUN 9 06/20/2020 0405   CREATININE 0.41 (L) 06/20/2020 0405   CALCIUM 8.2 (L) 06/20/2020 0405   PROT 5.1 (L) 06/18/2020 0341   ALBUMIN 1.5 (L) 06/18/2020 0341   AST 19 06/18/2020 0341   ALT 18 06/18/2020 0341   ALKPHOS 93 06/18/2020 0341   BILITOT 0.3 06/18/2020 0341   GFRNONAA >60 06/20/2020 0405   GFRAA >60 09/09/2019 0207   Lipase  No results found for: LIPASE     Studies/Results: No results  found.  Anti-infectives: Anti-infectives (From admission, onward)   Start     Dose/Rate Route Frequency Ordered Stop   06/20/20 2200  amoxicillin-clavulanate (AUGMENTIN XR) 1000-62.5 MG per 12 hr tablet 2 tablet        2 tablet Oral Every 12 hours 06/20/20 1939     05/30/20 0600  cefoTEtan (CEFOTAN) 2 g in sodium chloride 0.9 % 100 mL IVPB        2 g 200 mL/hr over 30 Minutes Intravenous To Surgery 05/29/20 1430 05/30/20 1745   05/29/20 1400  neomycin (MYCIFRADIN) tablet 1,000 mg       "And" Linked Group Details   1,000 mg Oral 3 times per day 05/29/20 1152 05/29/20 2228   05/29/20 1400  metroNIDAZOLE (FLAGYL) tablet 1,000 mg       "And" Linked Group Details   1,000 mg Oral 3 times per day 05/29/20 1152 05/29/20 2228       Assessment/Plan COPD- PRN nebs Non-bleeding duodenal ulcer  Grade B esophogitis, gastritis, esophageal stenosis s/p dilation 4/2 Grade II internal hemorrhoids  Alcohol use disorder History of tobacco use ABL anemia- hgb stableat8.8(4/25). Dr. Burr Medico recommendedstarting him on ferrous sulfate 325 mg twice dailywhen ileus resolves.  Protein calorie malnutrition- Pre-alb 8.0(4/11)>> 9.3 (4/25).d/c TPN 4/27  Rectosigmoid colon cancer S/P robotic assisted LAR with end colostomy 05/30/20 Dr. Dema Severin -POD#22 -Path=T3N0 adenocarcinoma.Seen byDr. Allena Napoleon arrange follow up. -  WOC following for colostomy teaching -removedJP4/16 - some cellulitis around previous site, recommend PO abx for total of one week and topical antibiotic ointment, no abscess noted on exam  -CT scan 4/11 w/ ileus. No postoperative fluid collection or other complication noted. - CT scan 4/18 with possible SBO and transition in the mid ileum, no evidence of intraabdominal abscess - tolerating full liquids and continues to have bowel function - ok to discharge home on FLD from a surgical standpoint   FEN:FLD, Boost VTE: SCDs,Lovenox ID: neomycin/flagyl 4/5, cefotetan 4/6. PO  augmentin 4/27>> Foley: removed POD1. Voiding. Follow-Up- Dr. Dema Severin and Dr. Burr Medico  LOS: 18 days    Norm Parcel, [facility] Surgery 06/21/2020, 10:44 AM Please see Amion for pager number during day hours 7:00am-4:30pm

## 2020-06-21 NOTE — Progress Notes (Incomplete)
HD#[id number] Subjective:  Developed some itching on his abdomen overnight. Otherwise no significant overnight events.   Objective:  Vital signs in last 24 hours: Vitals:   06/20/20 0354 06/20/20 1440 06/20/20 2159 06/21/20 0541  BP: 120/77 114/76 128/74 116/83  Pulse: 98 93 (!) 102 97  Resp: 18 18 17 18   Temp: 97.6 F (36.4 C) (!) 97.3 F (36.3 C) 98 F (36.7 C) 97.9 F (36.6 C)  TempSrc: Oral Oral  Oral  SpO2: 97% 97% 98% 97%  Weight: 60.1 kg     Height:       Supplemental O2: Room Air SpO2: 97 % O2 Flow Rate (L/min): 6 L/min  Filed Weights   06/14/20 0404 06/15/20 0401 06/20/20 0354  Weight: 59.5 kg 55 kg 60.1 kg    Intake/Output Summary (Last 24 hours) at 06/21/2020 0704 Last data filed at 06/21/2020 0541 Gross per 24 hour  Intake 440 ml  Output 2120 ml  Net -1680 ml   Net IO Since Admission: -26,359.51 mL [06/21/20 0704]   Physical Exam:   General:   Pertinent Labs: CBC Latest Ref Rng & Units 06/21/2020 06/18/2020 06/14/2020  WBC 4.0 - 10.5 K/uL 5.0 5.8 8.2  Hemoglobin 13.0 - 17.0 g/dL 8.8(L) 8.5(L) 8.5(L)  Hematocrit 39.0 - 52.0 % 29.1(L) 28.4(L) 28.2(L)  Platelets 150 - 400 K/uL 355 364 378    CMP Latest Ref Rng & Units 06/20/2020 06/19/2020 06/18/2020  Glucose 70 - 99 mg/dL 103(H) 110(H) 121(H)  BUN 8 - 23 mg/dL 9 10 12   Creatinine 0.61 - 1.24 mg/dL 0.41(L) 0.36(L) 0.33(L)  Sodium 135 - 145 mmol/L 134(L) 134(L) 134(L)  Potassium 3.5 - 5.1 mmol/L 4.3 4.1 3.8  Chloride 98 - 111 mmol/L 101 104 104  CO2 22 - 32 mmol/L 27 27 24   Calcium 8.9 - 10.3 mg/dL 8.2(L) 7.8(L) 7.6(L)  Total Protein 6.5 - 8.1 g/dL - - 5.1(L)  Total Bilirubin 0.3 - 1.2 mg/dL - - 0.3  Alkaline Phos 38 - 126 U/L - - 93  AST 15 - 41 U/L - - 19  ALT 0 - 44 U/L - - 18    Assessment/Plan:   Principal Problem:   sigmoid colon cancer Active Problems:   Symptomatic anemia   Iron deficiency anemia due to chronic blood loss   Duodenal ulcer   Gastritis and gastroduodenitis    Gastroesophageal reflux disease with esophagitis without hemorrhage   Esophageal stricture   Protein-calorie malnutrition, severe   Aortic atherosclerosis (HCC)   Ileus following gastrointestinal surgery ([place])   Hyponatremia   Patient Summary: Patient is 65 yo gentlemanwith past medical history of questionable COPD,past left5,6,8ribs fracture who presented to the ED for dizziness, dyspnea with exertion, due to symptomatic anemia, found to have duodenal ulcer and acolorectal masss/p resection. Currently managing post-op ileusand SBO.  Colorectal mass(T3B, N0, Mx) Post-op Ileus-improving Patient tolerated full liquid diet well.    Good output seen in the ostomy bag.  Per surgery, will monitor him for the next 24 hours.  If he does well, will discharge home with full liquid and advance his diet slowly.  TPN was discontinued. -Painmanagement scheduled Tylenol and oxycodone as needed.  Only required 1 dose of oxycodone yesterday -Noadjuvant chemotherapy per oncology.Follow-up with oncology in 1-47months  Non-bleeding duodenal ulcer Grade B esophagitis without bleeding, gastritis s/p bx, moderate esophageal stenosis s/p dilation Inthe context of excessive NSAID use, heavy alcohol use and smoking - Continue sucralfate - continue protonix 40mg BID(will need10w, then reduce to 40mg   daily) - gastric pathologynegative for H. Pylori  Iron deficiencyanemia2/2 GI bleed. Hemoglobin is trending down slowly, likely from phlebotomy - Startoral iron supplementwhen he can tolerate p.o.  Alcoholuse disorder -Holdthiamine and multivitamin per RD  New lung densitieswhich are questioned to be post inflammatory vs atelectasis.  -can repeatimaging outpatient to reevaluate  Diet:Full liquid diet  IVF:NA BJS:[id number] Code:Full PT/OT recs:Home Health,walker. TOC recs:homehealth PT/OT  Dispo: Anticipated discharge toHomein1 days pendingPost-op ileus and SBO  management.  Mitzi Hansen, MD 06/21/2020, 7:04 AM Pager: [phone]  Please contact the on call pager after 5 pm and on weekends at [phone].

## 2020-06-25 ENCOUNTER — Telehealth (HOSPITAL_COMMUNITY): Payer: Self-pay | Admitting: Nurse Practitioner

## 2020-06-26 ENCOUNTER — Ambulatory Visit (INDEPENDENT_AMBULATORY_CARE_PROVIDER_SITE_OTHER): Payer: Medicare Other | Admitting: Family Medicine

## 2020-06-26 ENCOUNTER — Other Ambulatory Visit (HOSPITAL_BASED_OUTPATIENT_CLINIC_OR_DEPARTMENT_OTHER): Payer: Self-pay

## 2020-06-26 ENCOUNTER — Telehealth (HOSPITAL_BASED_OUTPATIENT_CLINIC_OR_DEPARTMENT_OTHER): Payer: Self-pay

## 2020-06-26 ENCOUNTER — Encounter (HOSPITAL_BASED_OUTPATIENT_CLINIC_OR_DEPARTMENT_OTHER): Payer: Self-pay | Admitting: Family Medicine

## 2020-06-26 ENCOUNTER — Other Ambulatory Visit: Payer: Self-pay

## 2020-06-26 VITALS — BP 122/78 | HR 112 | Ht 71.0 in | Wt 125.0 lb

## 2020-06-26 DIAGNOSIS — D500 Iron deficiency anemia secondary to blood loss (chronic): Secondary | ICD-10-CM

## 2020-06-26 DIAGNOSIS — K2970 Gastritis, unspecified, without bleeding: Secondary | ICD-10-CM | POA: Diagnosis not present

## 2020-06-26 DIAGNOSIS — Z939 Artificial opening status, unspecified: Secondary | ICD-10-CM | POA: Insufficient documentation

## 2020-06-26 DIAGNOSIS — C19 Malignant neoplasm of rectosigmoid junction: Secondary | ICD-10-CM | POA: Diagnosis not present

## 2020-06-26 DIAGNOSIS — R911 Solitary pulmonary nodule: Secondary | ICD-10-CM | POA: Diagnosis not present

## 2020-06-26 DIAGNOSIS — K2990 Gastroduodenitis, unspecified, without bleeding: Secondary | ICD-10-CM

## 2020-06-26 NOTE — Assessment & Plan Note (Signed)
Under care of home health nurse currently Will place order for DME supplies for patient to be able to continue to care for ostomy on his own once home health nurse is no longer assisting

## 2020-06-26 NOTE — Patient Instructions (Addendum)
  Medication Instructions:  Your physician recommends that you continue on your current medications as directed. Please refer to the Current Medication list given to you today. --If you need a refill on any your medications before your next appointment, please call your pharmacy first. If no refills are authorized on file call the office.--  Procedures Your physician recommends that you have a Non-Cardiac CT scanning. A CT scan noninvasive, special x-ray that produces cross-sectional images of the body using x-rays and a computer. CT scans help physicians diagnose and treat medical conditions. For some CT exams, a contrast material is used to enhance visibility in the area of the body being studied. CT scans provide greater clarity and reveal more details than regular x-ray exams.  Follow-Up: Your next appointment:   Your physician recommends that you schedule a follow-up appointment in: 3 WEEKS with Dr. de [place]  Thanks for letting us be apart of your health journey!!  Primary Care and Sports Medicine   Dr. de [place] and Worthy Keeler, DNP, AGNP  We recommend signing up for the patient portal called "MyChart".  Sign up information is provided on this After Visit Summary.  MyChart is used to connect with patients for Virtual Visits (Telemedicine).  Patients are able to view lab/test results, encounter notes, upcoming appointments, etc.  Non-urgent messages can be sent to your provider as well.   To learn more about what you can do with MyChart, please visit --  [email or web address].

## 2020-06-26 NOTE — Telephone Encounter (Signed)
[place] to verify what supplies needed to be submitted to DME for patients ostomy supplies Was informed that at the current moment patient does not need any supplies as he was given a welcome box at discharge When supplies are ordered Alvis Lemmings will fax information to the office for orders to be placed Layne Benton with office phone and fax number

## 2020-06-26 NOTE — Progress Notes (Signed)
    Procedures performed today:    None.  Independent interpretation of notes and tests performed by another provider:   None.  Brief History, Exam, Impression, and Recommendations:    [name] is a 65 year old male presenting for hospital follow-up.  Patient was admitted to hospital on 05/25/2020 due to severe anemia with hemoglobin at 5.5, dizziness, dyspnea on exertion.  Evaluation revealed that patient had positive fecal occult blood test and significant lab abnormalities.  Patient underwent multiple blood transfusions due to significant anemia and further evaluation for source of blood loss and iron deficiency revealed esophagitis, esophageal stricture, duodenal ulcer, gastritis and colonoscopy showed a partially obstructing mass in the rectosigmoid region.  Staging of this mass was found to be T3bN0Mx and surgical resection was recommended.  Patient underwent resection of the colonic mass with end colostomy.  Patient was discharged with office follow-up scheduled with specialist and home health coordinator.  Patient indicates that he has been doing fairly well since he returned home.  Continues to be on a soft and pure diet.  Feels that he has been more hungry and wishes to advance diet.  Reports good output into his ostomy, some days will have to empty the ostomy bag once and other days up to 4 times.  Home health OT will begin tomorrow, home health PT will begin next week.  Other questions today include obtaining ostomy supplies as well as request for handicap decal.  BP 122/78   Pulse (!) 112   Ht 5\' 11"  (1.803 m)   Wt 125 lb (56.7 kg)   SpO2 95%   BMI 17.32 kg/m   65 year old male in no acute distress Cardiovascular exam with regular rate and rhythm, no murmurs appreciated Lungs clear to auscultation with bilateral expiratory wheezing appreciated Abdomen soft with ostomy in place on left side, no skin breakdown observed surrounding the ostomy.  Area where port was placed on the right  side of abdomen with mild redness surrounding the healing incision, area where skin edges do not quite approximate over this incision.  No active drainage in this site.  Gastritis and gastroduodenitis Continue with pantoprazole twice daily Recommendation for transitioning to once daily after 1 month of discharge Continue to avoid NSAIDs, alcohol use Continue follow-up with GI, scheduled for June 1  sigmoid colon cancer Continue follow-up with oncology, scheduled for May 9   History of creation of ostomy [facility]) Under care of home health nurse currently Will place order for DME supplies for patient to be able to continue to care for ostomy on his own once home health nurse is no longer assisting  Iron deficiency anemia due to chronic blood loss Chronic blood loss related to sigmoid colon cancer We will plan for repeat CBC to monitor hemoglobin status Plan for oral iron supplementation once tolerating full diet  Pulmonary nodule Observed on imaging while undergoing evaluation for severe anemia and findings of colon cancer Chest imaging revealed lung densities as well as pulmonary nodule Patient with significant smoking history and recent diagnosis of colon cancer Will order chest CT for further evaluation  Spent 40 minutes on this patient encounter, including preparation, chart review, face-to-face counseling with patient and coordination of care, and documentation of encounter   ___________________________________________ Salaya Holtrop de [place], MD, ABFM, CAQSM Primary Care and [place]

## 2020-06-26 NOTE — Assessment & Plan Note (Addendum)
Continue with pantoprazole twice daily Recommendation for transitioning to once daily after 1 month of discharge Continue to avoid NSAIDs, alcohol use Continue follow-up with GI, scheduled for June 1

## 2020-06-26 NOTE — Assessment & Plan Note (Signed)
Observed on imaging while undergoing evaluation for severe anemia and findings of colon cancer Chest imaging revealed lung densities as well as pulmonary nodule Patient with significant smoking history and recent diagnosis of colon cancer Will order chest CT for further evaluation

## 2020-06-26 NOTE — Assessment & Plan Note (Signed)
Continue follow-up with oncology, scheduled for May 9

## 2020-06-26 NOTE — Assessment & Plan Note (Signed)
Chronic blood loss related to sigmoid colon cancer We will plan for repeat CBC to monitor hemoglobin status Plan for oral iron supplementation once tolerating full diet

## 2020-06-27 NOTE — Progress Notes (Signed)
[place]   Telephone:([phone]) [phone] Fax:(336) [phone]   Clinic Follow up Note   Patient Care Team: de [place], Blondell Reveal, MD as PCP - General (Family Medicine) Truitt Merle, MD as Consulting Physician (Oncology) Jonnie Finner, RN as Oncology Nurse Navigator  Date of Service:  07/02/2020  CHIEF COMPLAINT: F/u of colorectal cancer   SUMMARY OF ONCOLOGIC HISTORY: Oncology History Overview Note  Cancer Staging sigmoid colon cancer Staging form: Colon and Rectum, AJCC 8th Edition - Pathologic stage from 05/30/2020: Stage IIA (pT3, pN0, cM0) - Signed by Truitt Merle, MD on 06/04/2020 Stage prefix: Initial diagnosis Total positive nodes: 0 Histologic grading system: 4 grade system Histologic grade (G): G2 Residual tumor (R): R0 - None    sigmoid colon cancer  05/26/2020 Procedure   Upper Endoscopy by Dr Bryan Lemma  IMPRESSION - Benign-appearing esophageal stenosis. Dilated. - LA Grade B reflux esophagitis with no bleeding. - 5 cm hiatal hernia. - Gastritis. Biopsied. - Erythematous duodenopathy. Biopsied. - Non-bleeding duodenal ulcer with no stigmata of bleeding. - Normal second portion of the duodenum.   05/26/2020 Procedure   Colonoscopy by Dr Bryan Lemma  IMPRESSION Hemorrhoids found on perianal exam. - Malignant partially obstructing tumor in the proximal rectum. Biopsied. Tattooed. - Non-bleeding internal hemorrhoids. - The examined portion of the ileum was normal.   05/26/2020 Initial Biopsy   FINAL MICROSCOPIC DIAGNOSIS:   A. DUODENUM, BIOPSY:  - Gastric heterotopia.  - Warthin-Starry stain is negative for Helicobacter pylori.   B. STOMACH, BIOPSY:  - Mild chronic gastritis.  - Warthin-Starry stain is negative for Helicobacter pylori.   C. RECTAL MASS, BIOPSY:  - At least intramucosal adenocarcinoma, see comment.   COMMENT:   C. Depth of invasion cannot be accurately assessed due to the  superficial nature of the biopsy.   Dr. Vic Ripper reviewed.    05/27/2020 Imaging   MRI Pelvis  IMPRESSION: Findings of T3 B, N0, Mx colorectal cancer. Though tumor is more compatible with a sigmoid rather than rectal cancer based on position of tumor as compared to the anterior peritoneal reflection. Note that the exam shows limited assessment with respect to anatomic boundaries due to motion artifact.   05/30/2020 Cancer Staging   Staging form: Colon and Rectum, AJCC 8th Edition - Pathologic stage from 05/30/2020: Stage IIA (pT3, pN0, cM0) - Signed by Truitt Merle, MD on 06/04/2020 Stage prefix: Initial diagnosis Total positive nodes: 0 Histologic grading system: 4 grade system Histologic grade (G): G2 Residual tumor (R): R0 - None   05/30/2020 Surgery   XI ROBOTIC ASSISTED LOWER ANTERIOR RESECTION WITH END COLOSTOMY CREATION and COLOSTOMY by Dr Dema Severin    05/30/2020 Pathology Results   FINAL MICROSCOPIC DIAGNOSIS:   A. RECTOSIGMOID COLON, LOW ANTERIOR RESECTION:  - Invasive colonic adenocarcinoma, 4.6 cm.  - Tumor invades through the muscularis propria into pericolonic tissues.  - Margins of resection are not involved.  - Twenty-three lymph nodes, negative for carcinoma (0/23).  - See oncology table.    Mismatch Repair Protein (IHC)   SUMMARY INTERPRETATION: NORMAL   IHC EXPRESSION RESULTS  TEST           RESULT  MLH1:          Preserved nuclear expression  MSH2:          Preserved nuclear expression  MSH6:          Preserved nuclear expression  PMS2:          Preserved nuclear expression  06/03/2020 Initial Diagnosis   sigmoid colon cancer   06/04/2020 Imaging   CT AP IMPRESSION: 1. Status post interval rectosigmoid colon resection with left lower quadrant end ostomy and surgical drain in the low pelvis. 2. The stomach and small bowel are mildly distended and fluid-filled throughout, largest loops measuring up to 3.8 cm. Findings are consistent with postoperative ileus. 3. No evidence of postoperative fluid collection or  other complication. 4. New atelectasis or consolidation of the right lung base. New trace left pleural effusion.   Aortic Atherosclerosis (ICD10-I70.0).     06/11/2020 Imaging   CT AP  IMPRESSION: 1. Small-bowel obstruction with transition zone in the mid ileum. Previously placed drain has been removed. No evidence of intra-abdominal abscess. 2. Atelectasis at the right lung base. Small amount of pleural fluid on the right. 3. Aortic atherosclerosis.   Aortic Atherosclerosis (ICD10-I70.0).      CURRENT THERAPY:  Surveillance  INTERVAL HISTORY:  ELON EOFF is here for a follow up. He presents to the clinic with his son. He notes since surgery and hospital discharge he feels much better and stronger. He is able to ambulate and no longer needs walker. He has not done since hospital. He does not feel he needs more PT at home. He is back to being more active and eating has improved. He has gained a few pounds since surgery. He notes he is managing his colostomy bag mostly well. He gets help from his son mainly. He also notes he has nurse aid twice a week who also changes his bag. He denies ostomy leak. He has reduced alcohol intake. Since hospital discharge, he has only had 1 beer. I reviewed his medication list with him.  He notes he has lived enough and felt he could have done without the surgery or treatment. He was fine if he had died sooner. He will f/u with Dr Dema Severin in 2 days.   He had rib fracture in 09/2020. He notes with this he had protrusion of soft tissue of lower left neck. He notes he was told this was an air pocket from when his rib fracture affected his lungs.    REVIEW OF SYSTEMS:   Constitutional: Denies fevers, chills or abnormal weight loss Eyes: Denies blurriness of vision Ears, nose, mouth, throat, and face: Denies mucositis or sore throat Respiratory: Denies cough, dyspnea or wheezes Cardiovascular: Denies palpitation, chest discomfort or lower extremity  swelling Gastrointestinal:  Denies nausea, heartburn or change in bowel habits Skin: Denies abnormal skin rashes Lymphatics: Denies new lymphadenopathy or easy bruising Neurological:Denies numbness, tingling or new weaknesses Behavioral/Psych: Mood is stable, no new changes  All other systems were reviewed with the patient and are negative.  MEDICAL HISTORY:  Past Medical History:  Diagnosis Date  . COPD (chronic obstructive pulmonary disease) ([place])     SURGICAL HISTORY: Past Surgical History:  Procedure Laterality Date  . BALLOON DILATION N/A 05/26/2020   Procedure: BALLOON DILATION;  Surgeon: Lavena Bullion, DO;  Location: [facility];  Service: Endoscopy;  Laterality: N/A;  . BIOPSY  05/26/2020   Procedure: BIOPSY;  Surgeon: Lavena Bullion, DO;  Location: [facility];  Service: Endoscopy;;  . COLONOSCOPY WITH PROPOFOL N/A 05/26/2020   Procedure: COLONOSCOPY WITH PROPOFOL;  Surgeon: Lavena Bullion, DO;  Location: [facility];  Service: Endoscopy;  Laterality: N/A;  . COLOSTOMY N/A 05/30/2020   Procedure: COLOSTOMY;  Surgeon: Ileana Roup, MD;  Location: [facility];  Service: General;  Laterality: N/A;  .  ESOPHAGOGASTRODUODENOSCOPY (EGD) WITH PROPOFOL N/A 05/26/2020   Procedure: ESOPHAGOGASTRODUODENOSCOPY (EGD) WITH PROPOFOL;  Surgeon: Lavena Bullion, DO;  Location: [facility];  Service: Endoscopy;  Laterality: N/A;  . SUBMUCOSAL TATTOO INJECTION  05/26/2020   Procedure: SUBMUCOSAL TATTOO INJECTION;  Surgeon: Lavena Bullion, DO;  Location: [facility];  Service: Endoscopy;;  . XI ROBOTIC ASSISTED LOWER ANTERIOR RESECTION N/A 05/30/2020   Procedure: XI ROBOTIC ASSISTED LOWER ANTERIOR RESECTION WITH END COLOSTOMY CREATION;  Surgeon: Ileana Roup, MD;  Location: [facility];  Service: General;  Laterality: N/A;    I have reviewed the social history and family history with the patient and they are unchanged from previous note.  ALLERGIES:  has No Known  Allergies.  MEDICATIONS:  Current Outpatient Medications  Medication Sig Dispense Refill  . acetaminophen (TYLENOL) 325 MG tablet Take 2 tablets (650 mg total) by mouth every 6 (six) hours. 30 tablet 0  . docusate sodium (COLACE) 100 MG capsule Take 1 capsule (100 mg total) by mouth 2 (two) times daily. 60 capsule 0  . feeding supplement (ENSURE ENLIVE / ENSURE PLUS) LIQD Take 237 mLs by mouth 3 (three) times daily between meals. 237 mL 12  . Multiple Vitamin (MULTIVITAMIN WITH MINERALS) TABS tablet Take 1 tablet by mouth at bedtime. 30 tablet 0  . oxyCODONE (OXY IR/ROXICODONE) 5 MG immediate release tablet Take 1 tablet (5 mg total) by mouth every 4 (four) hours as needed for moderate pain or severe pain. 30 tablet 0  . pantoprazole (PROTONIX) 40 MG tablet Take 1 tablet (40 mg total) by mouth 2 (two) times daily. 60 tablet 0  . polyethylene glycol powder (GLYCOLAX/MIRALAX) 17 GM/SCOOP powder Dissolve 1 capful (17gm total) in water, and take by mouth daily. 510 g 0  . sucralfate (CARAFATE) 1 g tablet Take 1 tablet (1 g total) by mouth 4 (four) times daily. 120 tablet 0  . thiamine 100 MG tablet Take 1 tablet (100 mg total) by mouth at bedtime. 30 tablet 0   No current facility-administered medications for this visit.    PHYSICAL EXAMINATION: ECOG PERFORMANCE STATUS: 1 - Symptomatic but completely ambulatory  Vitals:   07/02/20 1032  BP: 126/78  Pulse: 98  Resp: 18  Temp: (!) 97 F (36.1 C)  SpO2: 97%   Filed Weights   07/02/20 1032  Weight: 126 lb 14.4 oz (57.6 kg)    GENERAL:alert, no distress and comfortable SKIN: skin color, texture, turgor are normal, no rashes or significant lesions EYES: normal, Conjunctiva are pink and non-injected, sclera clear  NECK: supple, thyroid normal size, non-tender, without nodularity LYMPH:  no palpable lymphadenopathy in the cervical, axillary  LUNGS: clear to auscultation and percussion with normal breathing effort HEART: regular rate &  rhythm and no murmurs and no lower extremity edema ABDOMEN:abdomen soft, non-tender and normal bowel sounds Musculoskeletal:no cyanosis of digits and no clubbing  NEURO: alert & oriented x 3 with fluent speech, no focal motor/sensory deficits  LABORATORY DATA:  I have reviewed the data as listed CBC Latest Ref Rng & Units 06/21/2020 06/18/2020 06/14/2020  WBC 4.0 - 10.5 K/uL 5.0 5.8 8.2  Hemoglobin 13.0 - 17.0 g/dL 8.8(L) 8.5(L) 8.5(L)  Hematocrit 39.0 - 52.0 % 29.1(L) 28.4(L) 28.2(L)  Platelets 150 - 400 K/uL 355 364 378     CMP Latest Ref Rng & Units 06/20/2020 06/19/2020 06/18/2020  Glucose 70 - 99 mg/dL 103(H) 110(H) 121(H)  BUN 8 - 23 mg/dL 9 10 12   Creatinine 0.61 - 1.24 mg/dL  0.41(L) 0.36(L) 0.33(L)  Sodium 135 - 145 mmol/L 134(L) 134(L) 134(L)  Potassium 3.5 - 5.1 mmol/L 4.3 4.1 3.8  Chloride 98 - 111 mmol/L 101 104 104  CO2 22 - 32 mmol/L 27 27 24   Calcium 8.9 - 10.3 mg/dL 8.2(L) 7.8(L) 7.6(L)  Total Protein 6.5 - 8.1 g/dL - - 5.1(L)  Total Bilirubin 0.3 - 1.2 mg/dL - - 0.3  Alkaline Phos 38 - 126 U/L - - 93  AST 15 - 41 U/L - - 19  ALT 0 - 44 U/L - - 18      RADIOGRAPHIC STUDIES: I have personally reviewed the radiological images as listed and agreed with the findings in the report. CT CHEST WO CONTRAST  Result Date: 07/02/2020 CLINICAL DATA:  Lung nodule. EXAM: CT CHEST WITHOUT CONTRAST TECHNIQUE: Multidetector CT imaging of the chest was performed following the standard protocol without IV contrast. COMPARISON:  05/25/2020 and 09/09/2019. FINDINGS: Cardiovascular: Atherosclerotic calcification of the aorta and coronary arteries. Heart size normal. No pericardial effusion. Mediastinum/Nodes: No pathologically enlarged mediastinal or axillary lymph nodes. Hilar regions are difficult to evaluate without IV contrast. Esophagus is grossly unremarkable. Lungs/Pleura: Biapical pleuroparenchymal scarring. Centrilobular and paraseptal emphysema. New peribronchovascular nodularity in  the right lower lobe with new subpleural atelectasis in both lower lobes. Similar minimal peribronchovascular nodularity in the anterior segment right upper lobe (4/103), most likely postinfectious in etiology. Tiny left pleural effusion is new. Airway is unremarkable. Upper Abdomen: Visualized portions of the liver, adrenal glands, kidneys, spleen pancreas, stomach and bowel are grossly unremarkable. No upper abdominal adenopathy. Musculoskeletal: No worrisome lytic or sclerotic lesions. IMPRESSION: 1. No suspicious pulmonary nodules. 2. New peribronchovascular nodularity in the right lower lobe, likely infectious or inflammatory in etiology. Additional mild subpleural volume loss in both lower lobes. 3. Tiny left pleural effusion, new. 4. Aortic atherosclerosis (ICD10-I70.0). Coronary artery calcification. 5.  Emphysema (ICD10-J43.9). Electronically Signed   By: Lorin Picket M.D.   On: 07/02/2020 13:52     ASSESSMENT & PLAN:  Marc Mcdonald is a 65 y.o. male with    1. Sigmoid colon cancer, pT3N0M0, stage II, MSS  -He was recently diagnosed with sigmoid cancer in 05/2020. His 05/26/20 upper endoscopy/colonoscopy showed at least intramucosal adenocarcinoma of rectosigmoid mass.  -He underwent colorectal surgery on 05/30/20 by Dr Dema Severin with colostomy. Surgical path showed 4.6cm of invasive adenocarcinoma that invades through the muscularis propria. Clear margins and LN negative. NO high risk features -His CT Chest from 07/02/20 has not been read yet when I saw him  -I discussed with stage II disease, adjuvant chemo is not highly recommended. I discussed he still has 20% risk of cancer recurrence. Will proceed with surveillance to monitor this risk.  -Per patient, Dr Dema Severin plans to reverse his colostomy in 6-12 months. He is managing well with help from son and nurse.  -I discussed the surveillance plan, which is a physical exam and lab test (including CBC, CMP and CEA) every 3-4 months for the first 2  years, then every 6-12 months, colonoscopy in one year, and surveillance CT scan every 6-12 month for up to 5 year.  -He is not very interested in close surveillance. He agrees to return in 6 months and repeat scan in 1 year. He will see Dr. Dema Severin in the interim.  -I encouraged him to watch for concerning symptoms and contact clinic if needed before next visit.    2.  Severe symptomatic anemia, from iron deficiency and rectal cancer -He  was treated with IV Feraheme on 05/27/20 and blood transfusions, last on 06/25/20.  -I recommend lab today to see if he requires IV Feraheme. He declined further IV therapy at this time.  -I recommend oral iron Ferous sulfate once daily for 3 months.    3. Comorbidities: COPD, Duodenal ulcer, Alcohol abuse, history of heavy smoking. -f/u with PCP    PLAN:  -Surgical pathology results reviewed with patient and his son, I do not recommend adjuvant chemotherapy  -Patient declined close follow-up.  He agrees to return with lab in 6 months -CT chest reviewed, no evidence of metastasis.  I will inform patient.   No problem-specific Assessment & Plan notes found for this encounter.   No orders of the defined types were placed in this encounter.  All questions were answered. The patient knows to call the clinic with any problems, questions or concerns. No barriers to learning was detected. The total time spent in the appointment was 30 minutes.     Truitt Merle, MD 07/02/2020   I, Joslyn Devon, am acting as scribe for Truitt Merle, MD.   I have reviewed the above documentation for accuracy and completeness, and I agree with the above.

## 2020-07-02 ENCOUNTER — Encounter: Payer: Self-pay | Admitting: Hematology

## 2020-07-02 ENCOUNTER — Other Ambulatory Visit: Payer: Self-pay

## 2020-07-02 ENCOUNTER — Ambulatory Visit (HOSPITAL_BASED_OUTPATIENT_CLINIC_OR_DEPARTMENT_OTHER)
Admission: RE | Admit: 2020-07-02 | Discharge: 2020-07-02 | Disposition: A | Discharge status: Home / Self Care | Payer: Medicare Other | Source: Ambulatory Visit | Attending: Family Medicine | Admitting: Family Medicine

## 2020-07-02 ENCOUNTER — Inpatient Hospital Stay: Payer: Medicare Other | Attending: Hematology | Admitting: Hematology

## 2020-07-02 VITALS — BP 126/78 | HR 98 | Temp 97.0°F | Resp 18 | Ht 71.0 in | Wt 126.9 lb

## 2020-07-02 DIAGNOSIS — C19 Malignant neoplasm of rectosigmoid junction: Secondary | ICD-10-CM | POA: Insufficient documentation

## 2020-07-02 DIAGNOSIS — R911 Solitary pulmonary nodule: Secondary | ICD-10-CM | POA: Insufficient documentation

## 2020-07-02 DIAGNOSIS — Z933 Colostomy status: Secondary | ICD-10-CM | POA: Diagnosis not present

## 2020-07-02 IMAGING — CT CT CHEST W/O CM
2 of 4 series · 15 of 36 positions shown, 18 images · non-contrast
Comparison: [DATE] and [DATE].

CLINICAL DATA: Lung nodule.

EXAM:
CT CHEST WITHOUT CONTRAST
TECHNIQUE: Multidetector CT imaging of the chest was performed following the
standard protocol without IV contrast.

[Series 2: routine chest without · axial · non-contrast · 0.70mm/px · z∈[+1172,+1470]mm · 12 of 177 slices shown, 15 images]
[im 14/177  mediastinal]
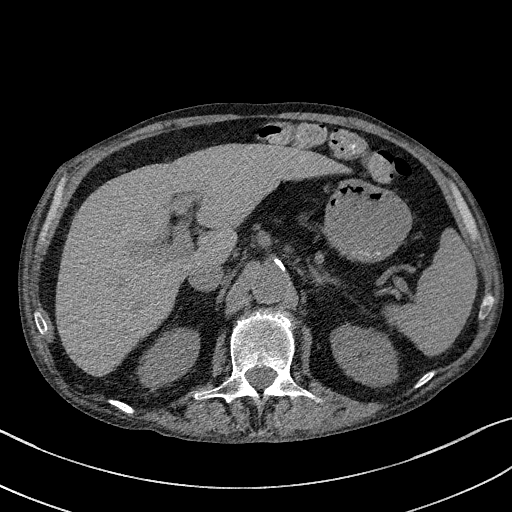
[im 14/177  lung]
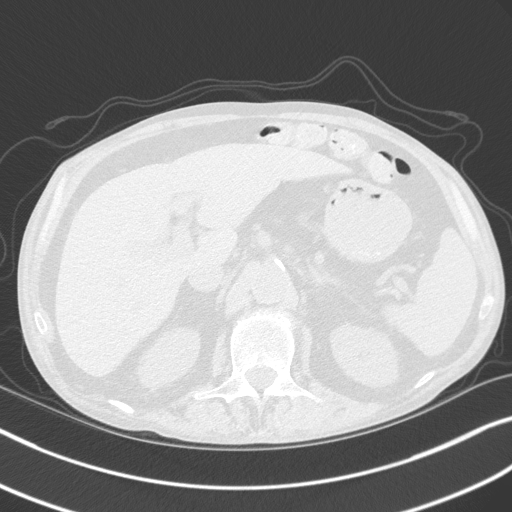
[im 28/177  lung]
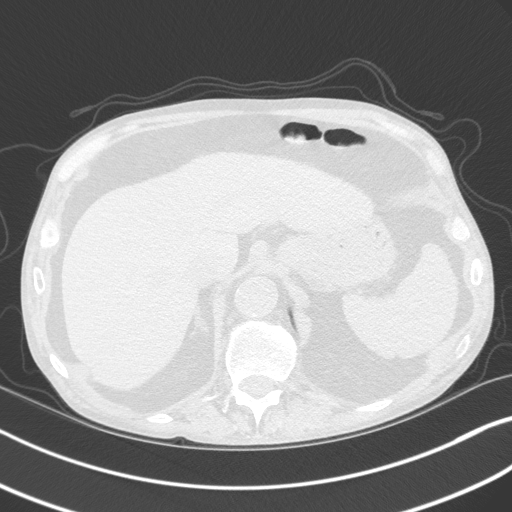
[im 41/177  lung]
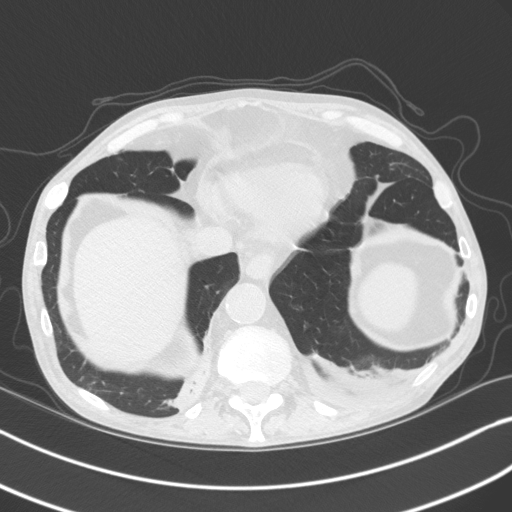
[im 55/177  lung]
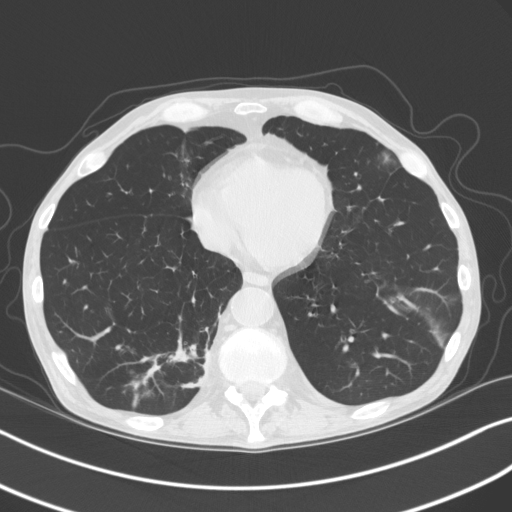
[im 68/177  mediastinal]
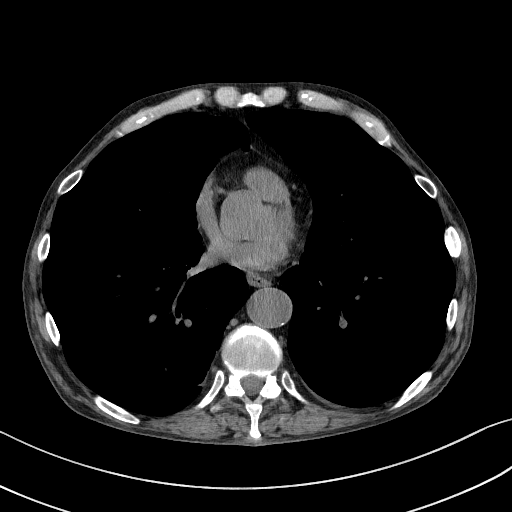
[im 68/177  lung]
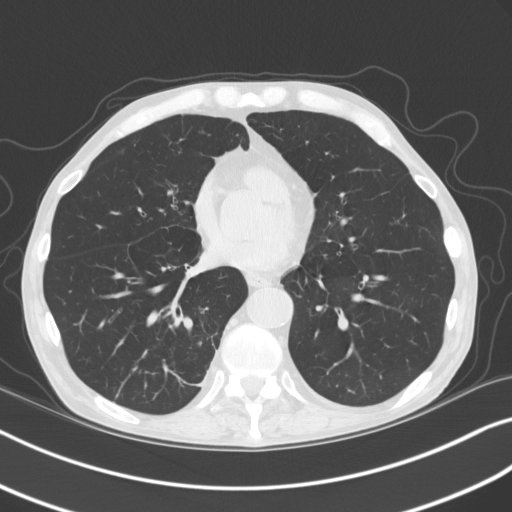
[im 82/177  lung]
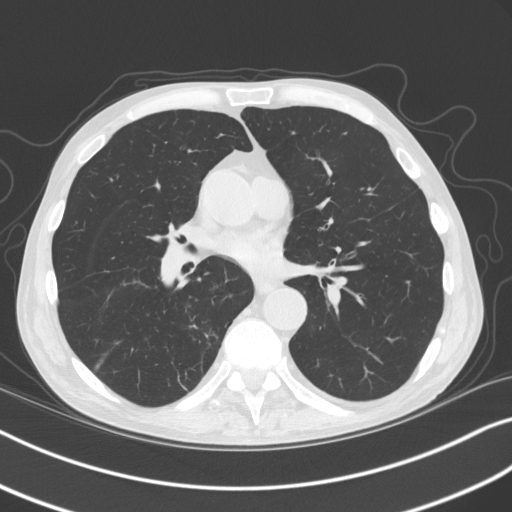
[im 95/177  lung]
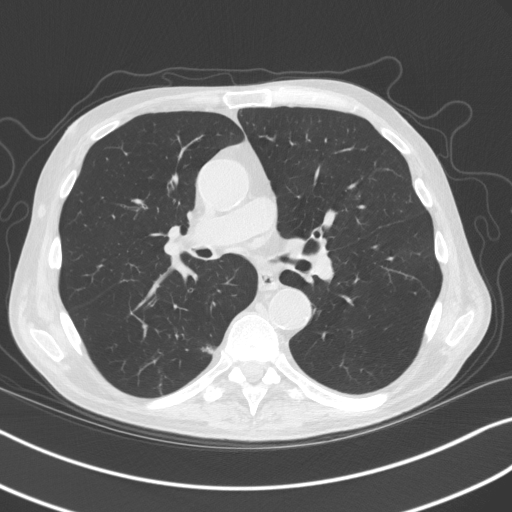
[im 109/177  lung]
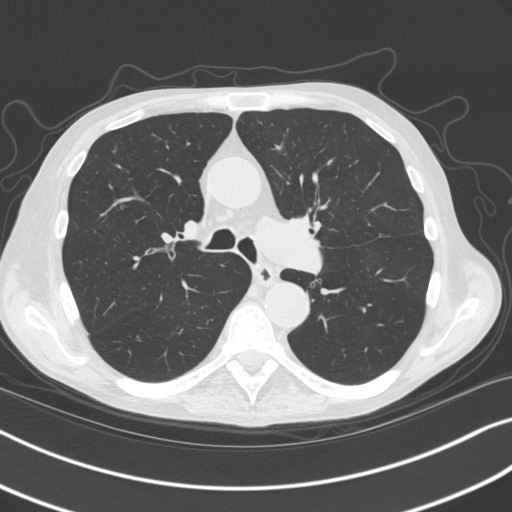
[im 122/177  mediastinal]
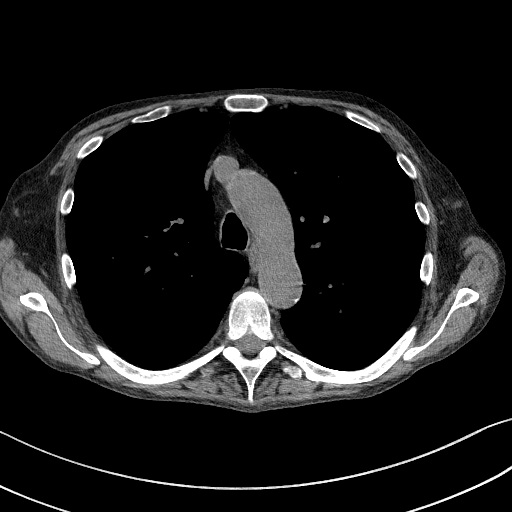
[im 122/177  lung]
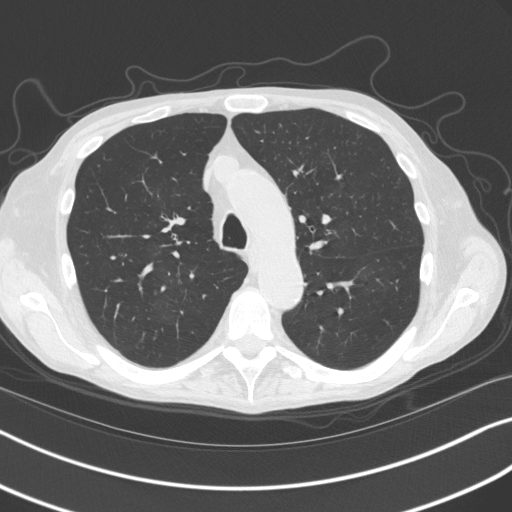
[im 136/177  lung]
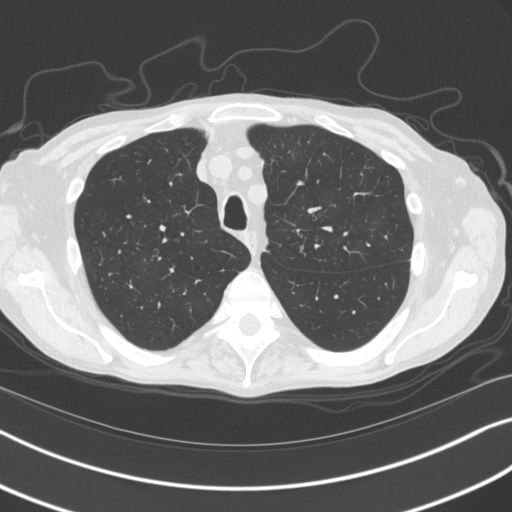
[im 149/177  lung]
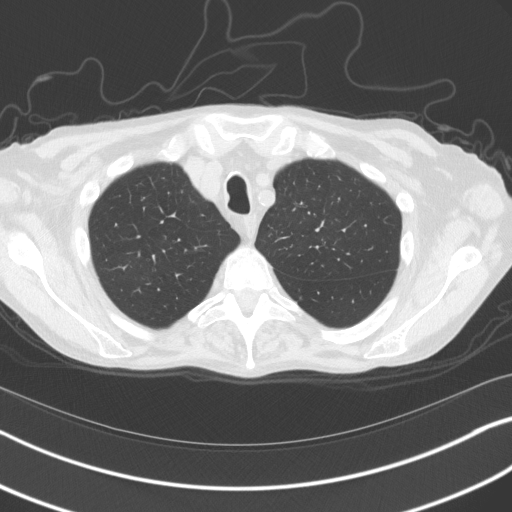
[im 163/177  lung]
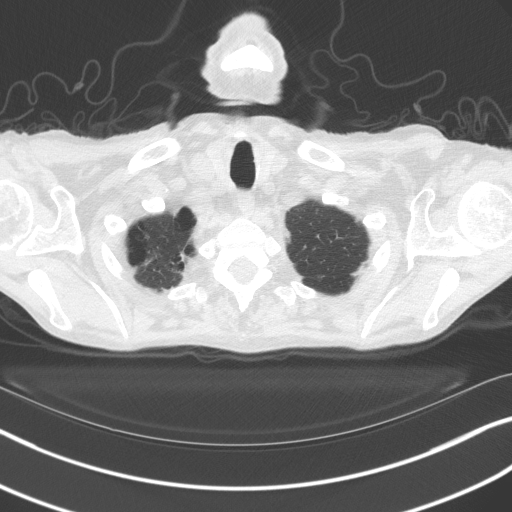

[Series 5: coronal · coronal · 0.71mm/px · 3 of 142 slices shown]
[im 29/142  lung]
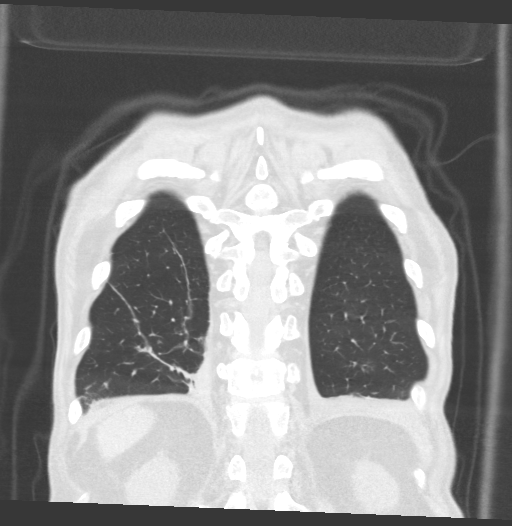
[im 57/142  lung]
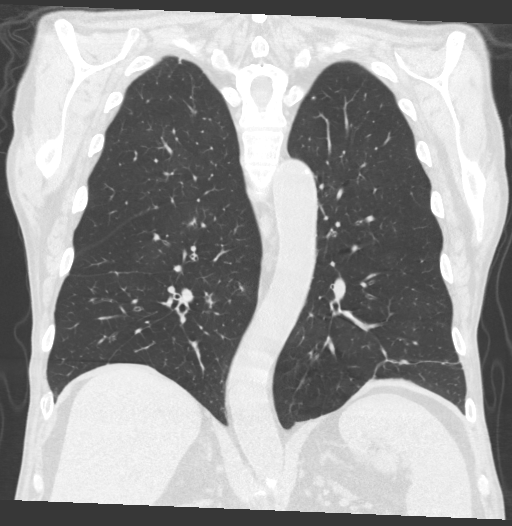
[im 85/142  lung]
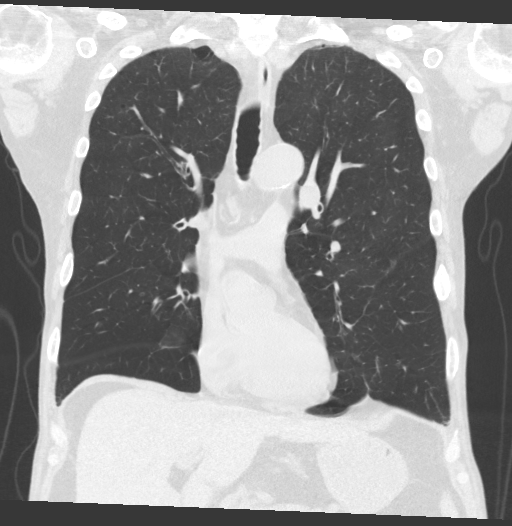

[15 of 36 positions shown; findings below may reference images not displayed]

FINDINGS: Cardiovascular: Atherosclerotic calcification of the aorta and
coronary arteries. Heart size normal. No pericardial effusion.

Mediastinum/Nodes: No pathologically enlarged mediastinal or
axillary lymph nodes. Hilar regions are difficult to evaluate
without IV contrast. Esophagus is grossly unremarkable.

Lungs/Pleura: Biapical pleuroparenchymal scarring. Centrilobular and
paraseptal emphysema. New peribronchovascular nodularity in the
right lower lobe with new subpleural atelectasis in both lower
lobes. Similar minimal peribronchovascular nodularity in the
anterior segment right upper lobe (4/103), most likely
postinfectious in etiology. Tiny left pleural effusion is new.
Airway is unremarkable.

Upper Abdomen: Visualized portions of the liver, adrenal glands,
kidneys, spleen pancreas, stomach and bowel are grossly
unremarkable. No upper abdominal adenopathy.

Musculoskeletal: No worrisome lytic or sclerotic lesions.
IMPRESSION: 1. No suspicious pulmonary nodules.
2. New peribronchovascular nodularity in the right lower lobe,
likely infectious or inflammatory in etiology. Additional mild
subpleural volume loss in both lower lobes.
3. Tiny left pleural effusion, new.
4. Aortic atherosclerosis ([4Y]-[4Y]). Coronary artery
calcification.
5.  Emphysema ([4Y]-[4Y]).

## 2020-07-06 ENCOUNTER — Telehealth (HOSPITAL_BASED_OUTPATIENT_CLINIC_OR_DEPARTMENT_OTHER): Payer: Self-pay

## 2020-07-06 NOTE — Telephone Encounter (Signed)
-----   Message from Raymond J de [place], MD sent at 07/05/2020 10:49 AM EDT ----- Overall reassuring CT scan of the chest.  No suspicious pulmonary nodules observed.

## 2020-07-06 NOTE — Telephone Encounter (Signed)
Called patient to review CT results Patient informed me that he has been having bilateral lower extremity swelling for about a week. Patient states his ankles and feet are swelling and itching Patient denies any pain, and he states he is elevating them at time but the swelling is not resolving I informed patient that I would try to contact his home health agency to see if they could come out to evaluate him as patient states he has no transportation to have an in office visit and he does not have the capability to do a video visit. Will defer to Dr. de [place] for advisement

## 2020-07-08 ENCOUNTER — Encounter (HOSPITAL_BASED_OUTPATIENT_CLINIC_OR_DEPARTMENT_OTHER): Payer: Self-pay | Admitting: Family Medicine

## 2020-07-09 MED ORDER — PANTOPRAZOLE SODIUM 40 MG PO TBEC: 40.0000 mg | DELAYED_RELEASE_TABLET | Freq: Every day | ORAL | 3 refills | Status: DC

## 2020-07-09 MED ORDER — PANTOPRAZOLE SODIUM 40 MG PO TBEC: 40.0000 mg | DELAYED_RELEASE_TABLET | Freq: Two times a day (BID) | ORAL | 0 refills | Status: DC

## 2020-07-09 MED ORDER — MULTI-VITAMIN/MINERALS PO TABS: 1.0000 | ORAL_TABLET | Freq: Every day | ORAL | 3 refills | Status: DC

## 2020-07-12 ENCOUNTER — Encounter (HOSPITAL_BASED_OUTPATIENT_CLINIC_OR_DEPARTMENT_OTHER): Payer: Self-pay | Admitting: Family Medicine

## 2020-07-20 ENCOUNTER — Ambulatory Visit (INDEPENDENT_AMBULATORY_CARE_PROVIDER_SITE_OTHER): Payer: Medicare Other | Admitting: Family Medicine

## 2020-07-20 ENCOUNTER — Encounter (HOSPITAL_BASED_OUTPATIENT_CLINIC_OR_DEPARTMENT_OTHER): Payer: Self-pay | Admitting: Family Medicine

## 2020-07-20 ENCOUNTER — Other Ambulatory Visit: Payer: Self-pay

## 2020-07-20 VITALS — BP 108/76 | HR 97 | Ht 71.0 in | Wt 129.0 lb

## 2020-07-20 DIAGNOSIS — G4700 Insomnia, unspecified: Secondary | ICD-10-CM | POA: Diagnosis not present

## 2020-07-20 DIAGNOSIS — K2990 Gastroduodenitis, unspecified, without bleeding: Secondary | ICD-10-CM | POA: Diagnosis not present

## 2020-07-20 DIAGNOSIS — K2970 Gastritis, unspecified, without bleeding: Secondary | ICD-10-CM | POA: Diagnosis not present

## 2020-07-20 DIAGNOSIS — Z939 Artificial opening status, unspecified: Secondary | ICD-10-CM

## 2020-07-20 MED ORDER — TRAZODONE HCL 50 MG PO TABS: 25.0000 mg | ORAL_TABLET | Freq: Every day | ORAL | 1 refills | Status: DC

## 2020-07-20 NOTE — Assessment & Plan Note (Signed)
Ongoing problem for patient, primarily with sleep initiation Reportedly with some poor sleep hygiene, particular daytime naps, encouraged to avoid this Provided handout with general sleep hygiene guideline measures Discussed options of pharmacotherapy, will proceed with low-dose of trazodone, particularly given positive depression score as well Discussed option of psychology referral, CBT, patient declines at this time

## 2020-07-20 NOTE — Progress Notes (Signed)
    Procedures performed today:    None.  Independent interpretation of notes and tests performed by another provider:   None.  Brief History, Exam, Impression, and Recommendations:    [name] is a 65 year old male presenting for follow-up.  Has upcoming appointment with GI on June 1.  Has continued taking pantoprazole.  Plan was for twice daily dosing of pantoprazole until 1 month after discharge at which point he would switch to once daily dosing.  Does not have all of supplies related to ostomy care.  Orders have been sent previously to [facility].  Reports that he is getting low for certain supplies.  In particular has not had overhangs for use with his ostomy.  Otherwise denies issues related to ostomy.  Has been having some insomnia with difficulty falling asleep.  He does report that during the day he will sleep a fair amount as he reports that when his son leaves that he then has some quiet at home and "there is nothing else to do."  BP 108/76   Pulse 97   Ht 5\' 11"  (1.803 m)   Wt 129 lb (58.5 kg)   SpO2 98%   BMI 17.39 kg/m   65 year old male, no acute distress, under nourished appearance Ostomy in place, no obvious skin erythema, small area over medial aspect of ostomy with very slight leakage  Gastritis and gastroduodenitis Presently taking pantoprazole, can transition to once daily dosing Recommend avoidance of NSAID use, alcohol use Has follow-up with GI next week, encouraged to continue with this  History of creation of ostomy ([place]) Does not have all required ostomy supplies, orders have previously been sent to [facility] Will follow-up with DME company regarding needed supplies -able to discuss with DME company, they are awaiting determination on certain supplies, will be able to provide patient with some of the supplies via their local office, will be arranging  Insomnia Ongoing problem for patient, primarily with sleep initiation Reportedly with some poor  sleep hygiene, particular daytime naps, encouraged to avoid this Provided handout with general sleep hygiene guideline measures Discussed options of pharmacotherapy, will proceed with low-dose of trazodone, particularly given positive depression score as well Discussed option of psychology referral, CBT, patient declines at this time  Spent 30 minutes on this patient encounter, including preparation, chart review, face-to-face counseling with patient and coordination of care, and documentation of encounter  Follow-up in about 6 weeks or sooner as needed   ___________________________________________ Tova Vater de [place], MD, ABFM, CAQSM Primary Care and [place]

## 2020-07-20 NOTE — Assessment & Plan Note (Addendum)
Presently taking pantoprazole, can transition to once daily dosing Recommend avoidance of NSAID use, alcohol use Has follow-up with GI next week, encouraged to continue with this

## 2020-07-20 NOTE — Patient Instructions (Signed)
  Medication Instructions:  Your physician has recommended you make the following change in your medication:  -- START Trazodone 25 mg - Take 0.5 tablet (25 mg) by mouth daily at betdtime --If you need a refill on any your medications before your next appointment, please call your pharmacy first. If no refills are authorized on file call the office.--  Follow-Up: Your next appointment:   Your physician recommends that you schedule a follow-up appointment in: 12 WEEKS with Dr. de [place]  Thanks for letting us be apart of your health journey!!  Primary Care and Sports Medicine   Dr. de [place] and Worthy Keeler, DNP, AGNP  We recommend signing up for the patient portal called "MyChart".  Sign up information is provided on this After Visit Summary.  MyChart is used to connect with patients for Virtual Visits (Telemedicine).  Patients are able to view lab/test results, encounter notes, upcoming appointments, etc.  Non-urgent messages can be sent to your provider as well.   To learn more about what you can do with MyChart, please visit --  [email or web address].

## 2020-07-20 NOTE — Assessment & Plan Note (Addendum)
Does not have all required ostomy supplies, orders have previously been sent to [facility] Will follow-up with DME company regarding needed supplies -able to discuss with DME company, they are awaiting determination on certain supplies, will be able to provide patient with some of the supplies via their local office, will be arranging

## 2020-07-25 ENCOUNTER — Encounter (HOSPITAL_BASED_OUTPATIENT_CLINIC_OR_DEPARTMENT_OTHER): Payer: Self-pay | Admitting: Family Medicine

## 2020-07-25 ENCOUNTER — Ambulatory Visit: Payer: Medicare Other | Admitting: Gastroenterology

## 2020-07-27 NOTE — Telephone Encounter (Signed)
Contacted [facility] which is patients home health agency to have home health nurse come out and evaluate patient Also requested Home health orders be extended as patient is still having issues with ostomy placement and dressing changes Contacted patients son to update him on the requests and that [facility] will have someone come out. Instructed patients son to call the office if issues continue Son is aware and agreeable to plan

## 2020-08-14 ENCOUNTER — Ambulatory Visit (HOSPITAL_COMMUNITY): Payer: Medicare Other

## 2020-08-22 ENCOUNTER — Encounter: Payer: Self-pay | Admitting: Nurse Practitioner

## 2020-08-22 ENCOUNTER — Ambulatory Visit (INDEPENDENT_AMBULATORY_CARE_PROVIDER_SITE_OTHER): Payer: Medicare Other | Admitting: Nurse Practitioner

## 2020-08-22 VITALS — BP 126/64 | HR 84 | Ht 71.0 in | Wt 131.4 lb

## 2020-08-22 DIAGNOSIS — Z933 Colostomy status: Secondary | ICD-10-CM

## 2020-08-22 DIAGNOSIS — D500 Iron deficiency anemia secondary to blood loss (chronic): Secondary | ICD-10-CM | POA: Diagnosis not present

## 2020-08-22 DIAGNOSIS — C19 Malignant neoplasm of rectosigmoid junction: Secondary | ICD-10-CM | POA: Diagnosis not present

## 2020-08-22 NOTE — Progress Notes (Signed)
08/22/2020 Marc Mcdonald [id number] 1955-06-02   CHIEF COMPLAINT: Follow up colon cancer, ulcers  HISTORY OF PRESENT ILLNESS:  Marc Mcdonald is a 65 year old male with a past medical history of COPD, alcohol use disorder, fall sustaining a left rib fracture and small pneumothorax in July 2021, reflux esophagitis, duodenal ulcers, IDA and he was diagnosed with colorectal adenocarcinoma stage IIa s/p robotic assisted lower anterior resection with endo colostomy by Dr. Nadeen Landau 05/30/2020. He is followed by oncologist Dr. Burr Medico. He presents to our office today for further follow up regarding IDA, colon cancer and duodenal ulcers. He is accompanied by his son, little Ricky. He was admitted to the hospital 05/25/2020 with symptomatic anemia. Admission Hg level was 5.3. He was transfused 3 units of PRBs. He was seen by Dr. Lyndel Safe at the time of his admission and he underwent an EGD/colonoscopy with Dr. Bryan Lemma on 05/26/2020. The EGD showed a benign-appearing esophageal stenosis which was dilated, reflux esophagitis, a 5 cm hiatal hernia gastritis a nonbleeding duodenal ulcer without stigmata of recent bleeding.  Biopsies were negative for H. pylori.  A repeat EGD in 12 weeks was recommended to assess for ulcer healing.  The colonoscopy identified a malignant partially obstructing tumor in the proximal rectum, pathology consistent with adenocarcinoma s/p LAR with colostomy placement as noted above.  He was seen by his oncologist Dr. Burr Medico as an outpatient, he preferred conservative management and he agreed to follow-up with Dr. Burr Medico in 6 months.  His appetite has improved. He has gained 5 to 6 lbs since his LAR and colostomy surgery. He stopped taking Ferrous Sulfate, upset his stomach a bit and didn't like taking it. His ostomy produces a moderate amount of thick brown stool. One day a few weeks ago he noted a small amount of blood to the left side of the stoma which has not recurred. He is having a  lot of difficulty with the ostomy appliance and adhesives. He stated the home health nurses no longer use the O ring which results in stool leakage from the colostomy bag. He also has skin irritation to the left of the colostomy due to the adhesives. No pain to the surrounding ostomy site.  He hopes to have the colostomy reversed within the next 6 to 12 months.  He takes MiraLAX daily.  He took Pantoprazole 40 mg p.o. twice daily for few weeks then decreased the Pantoprazole to 40 mg daily.  He does not wish to purse a repeat EGD to assess for duodenal ulcer healing. He has reduced his beer intake, previously drank 18 beers daily and since his surgery he is drinkg 3 to 4 beers daily. He stopped smoking cigarettes 2 years ago.    CBC Latest Ref Rng & Units 06/21/2020 06/18/2020 06/14/2020  WBC 4.0 - 10.5 K/uL 5.0 5.8 8.2  Hemoglobin 13.0 - 17.0 g/dL 8.8(L) 8.5(L) 8.5(L)  Hematocrit 39.0 - 52.0 % 29.1(L) 28.4(L) 28.2(L)  Platelets 150 - 400 K/uL 355 364 378    CMP Latest Ref Rng & Units 06/20/2020 06/19/2020 06/18/2020  Glucose 70 - 99 mg/dL 103(H) 110(H) 121(H)  BUN 8 - 23 mg/dL 9 10 12   Creatinine 0.61 - 1.24 mg/dL 0.41(L) 0.36(L) 0.33(L)  Sodium 135 - 145 mmol/L 134(L) 134(L) 134(L)  Potassium 3.5 - 5.1 mmol/L 4.3 4.1 3.8  Chloride 98 - 111 mmol/L 101 104 104  CO2 22 - 32 mmol/L 27 27 24   Calcium 8.9 - 10.3 mg/dL 8.2(L) 7.8(L)  7.6(L)  Total Protein 6.5 - 8.1 g/dL - - 5.1(L)  Total Bilirubin 0.3 - 1.2 mg/dL - - 0.3  Alkaline Phos 38 - 126 U/L - - 93  AST 15 - 41 U/L - - 19  ALT 0 - 44 U/L - - 18    EGD 05/26/2020: - Benign-appearing esophageal stenosis. Dilated. - LA Grade B reflux esophagitis with no bleeding. - 5 cm hiatal hernia. - Gastritis. Biopsied. - Erythematous duodenopathy. Biopsied. - Non-bleeding duodenal ulcer with no stigmata of bleeding. - Normal second portion of the duodenum. Impression: - Perform a colonoscopy today. - Use Protonix (pantoprazole) 40 mg PO BID for 10  weeks, then reduce to 40 mg daily. - Await pathology results. - Full liquid diet today, then advance tomorrow as tolerated. - Use sucralfate suspension 1 gram PO QID for 4 weeks. - May consider repeat EGD in 6-8 weeks as an outpatient to evaluate for appropriate mucosal healing.  Colonoscopy 05/26/2020: - Hemorrhoids found on perianal exam. - Malignant partially obstructing tumor in the proximal rectum. Biopsied. Tattooed. - Non-bleeding internal hemorrhoids. - The examined portion of the ileum was normal.  Biopsy Results:  . DUODENUM, BIOPSY:  - Gastric heterotopia.  - Warthin-Starry stain is negative for Helicobacter pylori.   B. STOMACH, BIOPSY:  - Mild chronic gastritis.  - Warthin-Starry stain is negative for Helicobacter pylori.   C. RECTAL MASS, BIOPSY:  - At least intramucosal adenocarcinoma, see comment.   CTAP 4/18/202: 1. Small-bowel obstruction with transition zone in the mid ileum. Previously placed drain has been removed. No evidence of intra-abdominal abscess. 2. Atelectasis at the right lung base. Small amount of pleural fluid on the right. 3. Aortic atherosclerosis.   Chest CT without contrast: 1. No suspicious pulmonary nodules. 2. New peribronchovascular nodularity in the right lower lobe, likely infectious or inflammatory in etiology. Additional mild subpleural volume loss in both lower lobes. 3. Tiny left pleural effusion, new. 4. Aortic atherosclerosis (ICD10-I70.0). Coronary artery calcification. 5.  Emphysema  S/P RECTOSIGMOID COLON, LOW ANTERIOR RESECTION 05/30/2020: - Invasive colonic adenocarcinoma, 4.6 cm.  - Tumor invades through the muscularis propria into pericolonic tissues.  - Margins of resection are not involved.  - Twenty-three lymph nodes, negative for carcinoma (0/23).  - See oncology table.   ECHO 05/27/2020: 1. Left ventricular ejection fraction, by estimation, is 65 to 70%. The left ventricle has normal function. Left  ventricular endocardial border not optimally defined to evaluate regional wall motion. Left ventricular diastolic parameters are consistent with Grade I diastolic dysfunction (impaired relaxation). 2. Right ventricular systolic function is hyperdynamic. The right ventricular size is normal. 3. The mitral valve is grossly normal. No evidence of mitral valve regurgitation. 4. The aortic valve is grossly normal. Aortic valve regurgitation is not visualized. 5. The inferior vena cava is dilated in size with >50% respiratory variability, suggesting right atrial pressure of 8 mmHg    Past Medical History:  Diagnosis Date   COPD (chronic obstructive pulmonary disease) ([place])    Past Surgical History:  Procedure Laterality Date   BALLOON DILATION N/A 05/26/2020   Procedure: BALLOON DILATION;  Surgeon: Lavena Bullion, DO;  Location: [facility];  Service: Endoscopy;  Laterality: N/A;   BIOPSY  05/26/2020   Procedure: BIOPSY;  Surgeon: Lavena Bullion, DO;  Location: [facility];  Service: Endoscopy;;   COLONOSCOPY WITH PROPOFOL N/A 05/26/2020   Procedure: COLONOSCOPY WITH PROPOFOL;  Surgeon: Lavena Bullion, DO;  Location: [facility];  Service: Endoscopy;  Laterality: N/A;  COLOSTOMY N/A 05/30/2020   Procedure: COLOSTOMY;  Surgeon: Ileana Roup, MD;  Location: [facility];  Service: General;  Laterality: N/A;   ESOPHAGOGASTRODUODENOSCOPY (EGD) WITH PROPOFOL N/A 05/26/2020   Procedure: ESOPHAGOGASTRODUODENOSCOPY (EGD) WITH PROPOFOL;  Surgeon: Lavena Bullion, DO;  Location: [facility];  Service: Endoscopy;  Laterality: N/A;   SUBMUCOSAL TATTOO INJECTION  05/26/2020   Procedure: SUBMUCOSAL TATTOO INJECTION;  Surgeon: Lavena Bullion, DO;  Location: [facility];  Service: Endoscopy;;   XI ROBOTIC ASSISTED LOWER ANTERIOR RESECTION N/A 05/30/2020   Procedure: XI ROBOTIC ASSISTED LOWER ANTERIOR RESECTION WITH END COLOSTOMY CREATION;  Surgeon: Ileana Roup, MD;  Location: [facility];   Service: General;  Laterality: N/A;   Social History: He is divorced.  He has 2 sons.  Past smoker.  Currently drinking 3-4 beers daily since 05/2020, previously drank 18 beers daily.  No drug use.  Family History: No family history of esophageal, gastric or colon cancer.  No Known Allergies   Outpatient Encounter Medications as of 08/22/2020  Medication Sig   acetaminophen (TYLENOL) 325 MG tablet Take 2 tablets (650 mg total) by mouth every 6 (six) hours.   docusate sodium (COLACE) 100 MG capsule Take 1 capsule (100 mg total) by mouth 2 (two) times daily.   feeding supplement (ENSURE ENLIVE / ENSURE PLUS) LIQD Take 237 mLs by mouth 3 (three) times daily between meals.   Multiple Vitamin (MULTIVITAMIN WITH MINERALS) TABS tablet Take 1 tablet by mouth at bedtime.   Multiple Vitamins-Minerals (MULTIVITAMIN WITH MINERALS) tablet Take 1 tablet by mouth daily.   pantoprazole (PROTONIX) 40 MG tablet Take 1 tablet (40 mg total) by mouth 2 (two) times daily for 14 days.   pantoprazole (PROTONIX) 40 MG tablet Take 1 tablet (40 mg total) by mouth daily.   polyethylene glycol powder (GLYCOLAX/MIRALAX) 17 GM/SCOOP powder Dissolve 1 capful (17gm total) in water, and take by mouth daily.   sucralfate (CARAFATE) 1 g tablet Take 1 tablet (1 g total) by mouth 4 (four) times daily.   thiamine 100 MG tablet Take 1 tablet (100 mg total) by mouth at bedtime.   traZODone (DESYREL) 50 MG tablet Take 0.5 tablets (25 mg total) by mouth at bedtime.   No facility-administered encounter medications on file as of 08/22/2020.    REVIEW OF SYSTEMS: Gen: + weight loss, has gained back 5 to 6 lbs. Denies fever, sweats or chills. CV: Denies chest pain, palpitations or edema. Resp: Denies cough, shortness of breath of hemoptysis.  GI: See HPI.   GU : Denies urinary burning, blood in urine, increased urinary frequency or incontinence. MS: Denies joint pain, muscles aches or weakness. Derm: Denies rash, itchiness, skin  lesions or unhealing ulcers. Psych: Denies depression, anxiety or memory loss.  Heme: Denies bruising, bleeding. Neuro:  Denies headaches, dizziness or paresthesias. Endo:  Denies any problems with DM, thyroid or adrenal function.  PHYSICAL EXAM: BP 126/64   Pulse 84   Ht 5\' 11"  (1.803 m)   Wt 131 lb 6.4 oz (59.6 kg)   BMI 18.33 kg/m   General: 65 year old male thin in NAD. Head: Normocephalic and atraumatic. Eyes:  Sclerae non-icteric, conjunctive pink. Ears: Normal auditory acuity. Mouth: Poor dentition. No ulcers or lesions.  Neck: Supple, no lymphadenopathy or thyromegaly.  Lungs: Clear bilaterally to auscultation without wheezes, crackles or rhonchi. Heart: Regular rate and rhythm. No murmur, rub or gallop appreciated.  Abdomen: Soft, nontender, non distended. No masses. No hepatosplenomegaly. Normoactive bowel sounds x 4  quadrants. LLQ colostomy with stoma pink with a moderate amount of thick brown stool in the colostomy bag, scant leakage of stool to the right of the ostomy bag. No tenderness to the surrounding colostomy site.  Rectal: Deferred.  Musculoskeletal: Symmetrical with no gross deformities. Skin: Warm and dry. No rash or lesions on visible extremities. Extremities: No edema. Neurological: Alert oriented x 4, no focal deficits.  Psychological:  Alert and cooperative. Normal mood and affect.  ASSESSMENT AND PLAN:  84. 65 year old male with  colorectal adenocarcinoma stage IIa s/p robotic assisted lower anterior resection with endo colostomy 05/30/2020. No chemo. Plan to reverse colostomy in 6 to 12 months from initial LAR surgery date.  -Patient declined close follow up with Dr. Burr Medico, agreed to follow up in office in 6 months  -Continue follow-up with general surgery -Schedule consult at the ostomy clinic asap -Ok to continue MiraLAX daily as needed -Patient will call our office if he develops any abdominal pain any pain surrounding his colostomy site or if he  develops bloody stools  2. IDA secondary to colorectal cancer. Received IV Feraheme 05/2020 and blood transfusion 06/25/2020. No longer taking oral iron.  -CBC, iron, iron saturation, TIBC and ferritin level.  He could not go to the lab this morning as his son needed to go to work, he agreed to go to the lab next week when his son is off work.  3. Reflux esophagitis, duodenal ulcers per EGD 05/26/2020 -Continue Pantoprazole 40mg  QD -Patient does not wish to purse a repeat EGD to assess for duodenal ulcer healing  -Follow up with Dr. Lyndel Safe in 8 weeks    CC:  de [place], Blondell Reveal, MD

## 2020-08-22 NOTE — Patient Instructions (Addendum)
If you are age 66 or older, your body mass index should be between 23-30. Your Body mass index is 18.33 kg/m. If this is out of the aforementioned range listed, please consider follow up with your Primary Care Provider.  The [place] [facility] providers would like to encourage you to use [facility] to communicate with providers for non-urgent requests or questions.  Due to long hold times on the telephone, sending your provider a message by [facility] may be faster and more efficient way to get a response. Please allow 48 business hours for a response.  Please remember that this is for non-urgent requests/questions.  LABS:  Lab work has been ordered for you today. Our lab is located in the basement. Press "B" on the elevator. The lab is located at the first door on the left as you exit the elevator.  HEALTHCARE LAWS AND MY CHART RESULTS: Due to recent changes in healthcare laws, you may see the results of your imaging and laboratory studies on MyChart before your provider has had a chance to review them.   We understand that in some cases there may be results that are confusing or concerning to you. Not all laboratory results come back in the same time frame and the provider may be waiting for multiple results in order to interpret others.  Please give [place] 48 hours in order for your provider to thoroughly review all the results before contacting the office for clarification of your results.   Continue Pantoprazole 40 MG once a day. Please follow up with Dr. Lyndel Safe in 6-8 weeks.  We place placed a referral to Amb Referral to [place] Address:[address] [id number] [place] [address] Phone:[phone] Expires:08/22/2021  .  You should hear from their office in the next couple weeks to schedule an appointment. If you do not hear from them please call them at [phone].  It was great seeing you today! Thank you for entrusting me with your care  and choosing [facility].  Noralyn Pick, CRNP

## 2020-08-24 NOTE — Progress Notes (Signed)
Agree with the plan RG 

## 2020-08-28 ENCOUNTER — Other Ambulatory Visit (INDEPENDENT_AMBULATORY_CARE_PROVIDER_SITE_OTHER): Payer: Medicare Other

## 2020-08-28 ENCOUNTER — Other Ambulatory Visit: Payer: Self-pay

## 2020-08-28 ENCOUNTER — Encounter (HOSPITAL_COMMUNITY)
Admission: RE | Admit: 2020-08-28 | Discharge: 2020-08-28 | Disposition: A | Discharge status: Home / Self Care | Payer: Medicare Other | Source: Ambulatory Visit | Attending: Physician Assistant | Admitting: Physician Assistant

## 2020-08-28 DIAGNOSIS — Z933 Colostomy status: Secondary | ICD-10-CM

## 2020-08-28 DIAGNOSIS — D500 Iron deficiency anemia secondary to blood loss (chronic): Secondary | ICD-10-CM

## 2020-08-28 DIAGNOSIS — Z01812 Encounter for preprocedural laboratory examination: Secondary | ICD-10-CM | POA: Diagnosis not present

## 2020-08-28 DIAGNOSIS — L24B1 Irritant contact dermatitis related to digestive stoma or fistula: Secondary | ICD-10-CM

## 2020-08-28 DIAGNOSIS — K9409 Other complications of colostomy: Secondary | ICD-10-CM | POA: Diagnosis not present

## 2020-08-28 DIAGNOSIS — C19 Malignant neoplasm of rectosigmoid junction: Secondary | ICD-10-CM

## 2020-08-28 DIAGNOSIS — K6289 Other specified diseases of anus and rectum: Secondary | ICD-10-CM | POA: Diagnosis not present

## 2020-08-28 LAB — COMPREHENSIVE METABOLIC PANEL
ALT: 6 U/L (ref 0–53)
AST: 12 U/L (ref 0–37)
Albumin: 3.7 g/dL (ref 3.5–5.2)
Alkaline Phosphatase: 77 U/L (ref 39–117)
BUN: 6 mg/dL (ref 6–23)
CO2: 28 mEq/L (ref 19–32)
Calcium: 9.2 mg/dL (ref 8.4–10.5)
Chloride: 103 mEq/L (ref 96–112)
Creatinine, Ser: 0.55 mg/dL (ref 0.40–1.50)
GFR: 103.51 mL/min (ref >60.00)
Glucose, Bld: 98 mg/dL (ref 70–99)
Potassium: 3.8 mEq/L (ref 3.5–5.1)
Sodium: 139 mEq/L (ref 135–145)
Total Bilirubin: 1.1 mg/dL (ref 0.2–1.2)
Total Protein: 7.3 g/dL (ref 6.0–8.3)

## 2020-08-28 LAB — CBC WITH DIFFERENTIAL/PLATELET
Basophils Absolute: 0.1 10*3/uL (ref 0.0–0.1)
Basophils Relative: 0.7 % (ref 0.0–3.0)
Eosinophils Absolute: 0.2 10*3/uL (ref 0.0–0.7)
Eosinophils Relative: 2.4 % (ref 0.0–5.0)
HCT: 31.7 % — ABNORMAL LOW (ref 39.0–52.0)
Hemoglobin: 9.9 g/dL — ABNORMAL LOW (ref 13.0–17.0)
Lymphocytes Relative: 21.2 % (ref 12.0–46.0)
Lymphs Abs: 2 10*3/uL (ref 0.7–4.0)
MCHC: 31.3 g/dL (ref 30.0–36.0)
MCV: 72.3 fl — ABNORMAL LOW (ref 78.0–100.0)
Monocytes Absolute: 1 10*3/uL (ref 0.1–1.0)
Monocytes Relative: 11.1 % (ref 3.0–12.0)
Neutro Abs: 6.1 10*3/uL (ref 1.4–7.7)
Neutrophils Relative %: 64.6 % (ref 43.0–77.0)
Platelets: 454 10*3/uL — ABNORMAL HIGH (ref 150.0–400.0)
RBC: 4.38 Mil/uL (ref 4.22–5.81)
RDW: 18.4 % — ABNORMAL HIGH (ref 11.5–15.5)
WBC: 9.4 10*3/uL (ref 4.0–10.5)

## 2020-08-28 NOTE — Progress Notes (Signed)
[facility]   Reason for visit:  Contact dermatitis  peristomal hernia HPI:  LLQ colostomy, reversing in 6 months or less  Contact dermatitis. Pouch leaking daily.  Being windowpane taped along perimeter with harsh plastic tape.  ROS  Review of Systems  Gastrointestinal:  Positive for constipation.  Vital signs:  BP (!) 146/83 (BP Location: Left Arm)   Pulse 99   Temp 97.9 F (36.6 C) (Oral)   Resp 18   Ht 5\' 11"  (1.803 m)   Wt 59.6 kg   BMI 18.33 kg/m  Exam:  Physical Exam Abdominal:    Skin:      Neurological:     Mental Status: He is alert.    Stoma type/location:  LLQ colostomy , budded with peristomal hernia noted Stomal assessment/size:  1 3/4"  patient reports that stoma changes size and shape.  Peristomal assessment:  Denuded skin with full thickness breakdown present at 3 and 9 o'clock.  Patient was given a belt in the hospital but has not used the belt. Is in a 2 3/4" pouch, two piece system with barrier ring.  Needs 1piece convex pouch, barrier ring and belt.  Skin breakdown will be treated with stoma powder and skin prep.  Treatment options for stomal/peristomal skin:  Output: firm stool.  Encouraged to take stool softeners daily. States he has difficulty emptying due to thickness.  Ostomy pouching: 1pc.convex with barrier ring and powder and skin prep. Ostomy belt.  Education provided:  performed pouch change and demonstrated crusting with powder and skin prep and barrier ring application.  Son and patient have performed some pouch changes, but still relay heavily on home health services.  He does not know who is providing supplies. I will try and call heather.    Impression/dx  Contact dermatitis, peristomal hernia Discussion  Implement belt and flexible convex pouch.  Plan  See back Friday to assess skin and new pouch.     Visit time: 65 minutes.   Domenic Moras FNP-BC

## 2020-08-28 NOTE — Discharge Instructions (Signed)
Patient pouching instructions to heal skin and promote seal of pouch:  No clear tape around perimeter (pouch leaking underneath and stool is breaking skin down) Remove old pouch and clean with soap and water.  Pat gently dry.  Crust peristomal skin with stoma powder and skin prep x 3 layers.  Allow to dry (5 seconds) Apply barrier ring to peristomal skin and to skin breakdown at 3 and 9 o'clock.  Will require 2 rings.  MEASURE STOMA- stoma changes size with peristalsis.  Today was 1 3/4"  sent pattern home Cut pouch to fit and apply.  CHANGING TO 1 piece convex pouch Apply barrier strips to perimeter.  This will support pouch (and hernia) but will not allow a leak to go unnoticed like the clear, harsher tape.  APPLY ostomy belt.  HAVE PATIENT WARM POUCH FOR 3-6 minutes before getting up.  (Heating pad, or pillow)

## 2020-08-29 LAB — IRON,TIBC AND FERRITIN PANEL
%SAT: 12 % (calc) — ABNORMAL LOW (ref 20–48)
Ferritin: 5 ng/mL — ABNORMAL LOW (ref 24–380)
Iron: 56 ug/dL (ref 50–180)
TIBC: 470 mcg/dL (calc) — ABNORMAL HIGH (ref 250–425)

## 2020-08-31 ENCOUNTER — Encounter (HOSPITAL_BASED_OUTPATIENT_CLINIC_OR_DEPARTMENT_OTHER): Payer: Self-pay | Admitting: Family Medicine

## 2020-08-31 ENCOUNTER — Other Ambulatory Visit: Payer: Self-pay

## 2020-08-31 ENCOUNTER — Ambulatory Visit (HOSPITAL_COMMUNITY)
Admission: RE | Admit: 2020-08-31 | Discharge: 2020-08-31 | Disposition: A | Discharge status: Home / Self Care | Payer: Medicare Other | Source: Ambulatory Visit | Attending: Family Medicine | Admitting: Family Medicine

## 2020-08-31 ENCOUNTER — Ambulatory Visit (INDEPENDENT_AMBULATORY_CARE_PROVIDER_SITE_OTHER): Payer: Medicare Other | Admitting: Family Medicine

## 2020-08-31 VITALS — BP 136/68 | HR 103 | Ht 71.0 in | Wt 133.0 lb

## 2020-08-31 DIAGNOSIS — K9400 Colostomy complication, unspecified: Secondary | ICD-10-CM

## 2020-08-31 DIAGNOSIS — K2990 Gastroduodenitis, unspecified, without bleeding: Secondary | ICD-10-CM

## 2020-08-31 DIAGNOSIS — D500 Iron deficiency anemia secondary to blood loss (chronic): Secondary | ICD-10-CM

## 2020-08-31 DIAGNOSIS — G4700 Insomnia, unspecified: Secondary | ICD-10-CM | POA: Diagnosis not present

## 2020-08-31 DIAGNOSIS — Z939 Artificial opening status, unspecified: Secondary | ICD-10-CM | POA: Diagnosis not present

## 2020-08-31 DIAGNOSIS — Z933 Colostomy status: Secondary | ICD-10-CM | POA: Diagnosis not present

## 2020-08-31 DIAGNOSIS — C19 Malignant neoplasm of rectosigmoid junction: Secondary | ICD-10-CM | POA: Insufficient documentation

## 2020-08-31 DIAGNOSIS — K2970 Gastritis, unspecified, without bleeding: Secondary | ICD-10-CM

## 2020-08-31 MED ORDER — TRAZODONE HCL 50 MG PO TABS: 50.0000 mg | ORAL_TABLET | Freq: Every day | ORAL | 1 refills | Status: DC

## 2020-08-31 NOTE — Assessment & Plan Note (Signed)
Continues to take pantoprazole, has transition to once daily dosing as instructed Recommend continuing to avoid NSAID use, alcohol use, still with daily alcohol consumption, however decreased from previously

## 2020-08-31 NOTE — Discharge Instructions (Signed)
Will order supplies from [employer] today.  See me again 09/11/20 at 9:00 to assess wounds on skin and new pouch If you run out of 1 piece pouches, use your old bags until new ones arrive.  USe instructions for pouching given at last visit.

## 2020-08-31 NOTE — Progress Notes (Signed)
[facility]   Reason for visit:  LLQ colostomy with peristomal hernia and full thickness tissue loss  frequent leaks  Rechecking 1 piece convex pouch with belt.  HPI:  LLQ colostomy  with peristomal hernia creating complex pouching  ROS  Review of Systems  Skin:  Positive for wound.  All other systems reviewed and are negative. Vital signs:  There were no vitals taken for this visit. Exam:  Physical Exam Abdominal:       Comments: LLQ colostomy RLQ scarring from JP drains.      Stoma type/location:  LLQ colostomy Stomal assessment/size:  1 3/4" budded stoma with hernia present.   Peristomal assessment:  full thickness tissue loss at 3 and 9 o'clock, this has improved since last since 08/28/20  Pouch has remained intact.  Treatment options for stomal/peristomal skin: stoma powder, barrier ring over open wounds and barrier ring to peristomal skin.   Output: soft brown Ostomy pouching: 1pc.convex with belt and barrier ring Education provided:  COntinue 1 piece convex/crusting with powder and barrier rings with ostomy belt.  Step by step instructions given for family and [facility] nurse to follow.  Supply company is [employer] and I will call in supply order today.     Impression/dx  Contact dermatitis and peristomal hernia Discussion  See above  See back 09/11/20 Plan  Follow up in office. Appointment given.     Visit time: 45 minutes.   Domenic Moras FNP-BC

## 2020-08-31 NOTE — Patient Instructions (Signed)
  Medication Instructions:  Your physician recommends that you continue on your current medications as directed. Please refer to the Current Medication list given to you today. --If you need a refill on any your medications before your next appointment, please call your pharmacy first. If no refills are authorized on file call the office.--  Follow-Up: Your next appointment:   Your physician recommends that you schedule a follow-up appointment in: 2 MONTHS with Dr. de [place]  Thanks for letting us be apart of your health journey!!  Primary Care and Sports Medicine   Dr. Arlina Robes [place]   We encourage you to activate your patient portal called "MyChart".  Sign up information is provided on this After Visit Summary.  MyChart is used to connect with patients for Virtual Visits (Telemedicine).  Patients are able to view lab/test results, encounter notes, upcoming appointments, etc.  Non-urgent messages can be sent to your provider as well. To learn more about what you can do with MyChart, please visit --  [email or web address].

## 2020-08-31 NOTE — Progress Notes (Signed)
    Procedures performed today:    None.  Independent interpretation of notes and tests performed by another provider:   None.  Brief History, Exam, Impression, and Recommendations:    Accompanied by son today  BP 136/68   Pulse (!) 103   Ht 5\' 11"  (1.803 m)   Wt 133 lb (60.3 kg)   SpO2 98%   BMI 18.55 kg/m   Gastritis and gastroduodenitis Continues to take pantoprazole, has transition to once daily dosing as instructed Recommend continuing to avoid NSAID use, alcohol use, still with daily alcohol consumption, however decreased from previously  Iron deficiency anemia due to chronic blood loss Did receive IV iron infusions Subjectively reports that he is feeling improved compared to when he has significant anemia previously Recent labs do show improvement in red blood cell count, hemoglobin, still with low iron stores overall however Will need to continue monitoring intermittently, following up with GI  History of creation of ostomy [facility]) Does have infection related to ostomy Is following with ostomy clinic at the hospital Also has home health helping with ostomy care Continue close follow-up with ostomy clinic for management  Insomnia Has made some changes in regards to sleep hygiene, has been using trazodone which he has found to be helpful, but has needed 50 mg dose as opposed to 25 mg dose Will provide refill of trazodone at 50 mg to be taken at night, continue with sleep hygiene measures  Plan for follow-up in about 2 months to monitor above  Spent 30 minutes on this patient encounter, including preparation, chart review, face-to-face counseling with patient and coordination of care, and documentation of encounter   ___________________________________________ Nanda Bittick de [place], MD, ABFM, CAQSM Primary Care and [place]

## 2020-08-31 NOTE — Assessment & Plan Note (Signed)
Did receive IV iron infusions Subjectively reports that he is feeling improved compared to when he has significant anemia previously Recent labs do show improvement in red blood cell count, hemoglobin, still with low iron stores overall however Will need to continue monitoring intermittently, following up with GI

## 2020-08-31 NOTE — Assessment & Plan Note (Addendum)
Does have infection related to ostomy Is following with ostomy clinic at the hospital Also has home health helping with ostomy care Continue close follow-up with ostomy clinic for management

## 2020-08-31 NOTE — Assessment & Plan Note (Signed)
Has made some changes in regards to sleep hygiene, has been using trazodone which he has found to be helpful, but has needed 50 mg dose as opposed to 25 mg dose Will provide refill of trazodone at 50 mg to be taken at night, continue with sleep hygiene measures

## 2020-09-07 ENCOUNTER — Telehealth (HOSPITAL_BASED_OUTPATIENT_CLINIC_OR_DEPARTMENT_OTHER): Payer: Self-pay

## 2020-09-07 NOTE — Telephone Encounter (Signed)
Patient called in stating that he has a hernia on his ostomy and is in a lot of pain He states he has attempted to contact his ostomy wound care physician and has been unable to get in contact with her I have called several times and left voice mail messages and also have been unable to reach anyone Informed the patient I would discuss his concerns with Dr. Tennis Must [place]

## 2020-09-11 ENCOUNTER — Ambulatory Visit (HOSPITAL_COMMUNITY)
Admission: RE | Admit: 2020-09-11 | Discharge: 2020-09-11 | Disposition: A | Discharge status: Home / Self Care | Payer: Medicare Other | Source: Ambulatory Visit | Attending: Nurse Practitioner | Admitting: Nurse Practitioner

## 2020-09-11 VITALS — BP 142/83 | HR 90 | Temp 97.7°F | Resp 18

## 2020-09-11 DIAGNOSIS — K9400 Colostomy complication, unspecified: Secondary | ICD-10-CM

## 2020-09-11 DIAGNOSIS — Z85048 Personal history of other malignant neoplasm of rectum, rectosigmoid junction, and anus: Secondary | ICD-10-CM | POA: Insufficient documentation

## 2020-09-11 DIAGNOSIS — L259 Unspecified contact dermatitis, unspecified cause: Secondary | ICD-10-CM | POA: Insufficient documentation

## 2020-09-11 DIAGNOSIS — L24B1 Irritant contact dermatitis related to digestive stoma or fistula: Secondary | ICD-10-CM

## 2020-09-11 DIAGNOSIS — K9409 Other complications of colostomy: Secondary | ICD-10-CM | POA: Diagnosis not present

## 2020-09-11 NOTE — Progress Notes (Signed)
[facility]   Reason for visit:  Recheck contact dermatitis and peristomal hernia HPI:  Colostomy due to rectal cancer ROS  Review of Systems  Skin:  Positive for wound.       Contact dermatitis   Vital signs:  BP (!) 142/83 (BP Location: Right Arm)   Pulse 90   Temp 97.7 F (36.5 C) (Oral)   Resp 18  Exam:  Physical Exam Abdominal:     Hernia: A hernia is present. Hernia is present in the ventral area.      Stoma type/location:  LLQ colostomy Stomal assessment/size:  1 3/4" stoma Peristomal assessment:  bulging around stoma. Has had numerous hernias before.  Has pain at times when producing stool.  His PCP is aware.  Denuded skin at 3 and 9 o'clock is epithelializing.  Site at 3 o'clock remains tender.  Is protected with powder, no sting skin prep and barrier ring. Denzil Hughes has delivered supplied but sent the 2 piece system originally used.  I called [employer] and we got an order coming by the end of the week for convex bags and barrier strips.  Treatment options for stomal/peristomal skin: continue same pouching, powder and skin prep.  Barrier ring around stoma and over denuded skin.  1 piece convex bag with ostomy belt.   Output: soft brown stool  Ostomy pouching: 1pc. Convex with barrier ring and belt  Education provided:  Given supplies to last until new order arrives. SPoke with son after visit and he is in agreement with plan of care.     Impression/dx  Contact dermatitis, improving   peristomal hernia Discussion  Clarified pouching system and assisted with ordering supplies.  Plan  Call in one week.  Make appointment if no better .    Visit time: 60 minutes.   Domenic Moras FNP-BC

## 2020-09-20 ENCOUNTER — Ambulatory Visit (HOSPITAL_BASED_OUTPATIENT_CLINIC_OR_DEPARTMENT_OTHER): Payer: Medicare Other | Admitting: Family Medicine

## 2020-09-20 ENCOUNTER — Telehealth (HOSPITAL_BASED_OUTPATIENT_CLINIC_OR_DEPARTMENT_OTHER): Payer: Self-pay | Admitting: Family Medicine

## 2020-09-20 NOTE — Telephone Encounter (Signed)
Called to reschedule [facility]

## 2020-09-21 ENCOUNTER — Other Ambulatory Visit: Payer: Self-pay

## 2020-09-21 ENCOUNTER — Ambulatory Visit (HOSPITAL_COMMUNITY)
Admission: RE | Admit: 2020-09-21 | Discharge: 2020-09-21 | Disposition: A | Discharge status: Home / Self Care | Payer: Medicare Other | Source: Ambulatory Visit | Attending: Nurse Practitioner | Admitting: Nurse Practitioner

## 2020-09-21 DIAGNOSIS — K9409 Other complications of colostomy: Secondary | ICD-10-CM | POA: Diagnosis not present

## 2020-09-21 DIAGNOSIS — L24B1 Irritant contact dermatitis related to digestive stoma or fistula: Secondary | ICD-10-CM | POA: Diagnosis not present

## 2020-09-21 DIAGNOSIS — K432 Incisional hernia without obstruction or gangrene: Secondary | ICD-10-CM

## 2020-09-21 DIAGNOSIS — K9400 Colostomy complication, unspecified: Secondary | ICD-10-CM

## 2020-09-21 NOTE — Discharge Instructions (Signed)
New pouch instructions:  POuch is leaking because you are still using 2 piece bags. I will call [employer] again and try to get more 1 piece convex bags.  Clean skin around stoma. Place squares of Aquacel AG over the open areas at 3 and 9 o'clock and cover with strips of barrier ring.  (I sent photo to Zena to demonstrate)   Cut barrier to fit, I sent pattern home Apply 1 piece bag and barrier strips Apply ostomy belt.  See me in one week.

## 2020-09-21 NOTE — Progress Notes (Signed)
[facility]   Reason for visit:  Ongoing leaks and hernia.  Has not used the 1piece pouches as [employer] has not delivered them. I called Dee with [employer] and gave verbal orders for 1 piece pouches, barrier rings, barrier strips and an extra ostomy belt.   HPI:  LLQ colostomy with peristomal hernia ROS  Review of Systems  Gastrointestinal:        LLQ colostomy with pain at hernia site  All other systems reviewed and are negative. Vital signs:  BP (!) 166/79 (BP Location: Right Arm)   Pulse 87   Temp 97.7 F (36.5 C) (Oral)   Resp 18   SpO2 99%  Exam:  Physical Exam Abdominal:     Hernia: A hernia is present.       Comments: Peristomal skin breakdown at 3 and 9 o'clock.   Protruding peristomal hernia creating complex pouching situation  Skin:    Findings: Lesion present.          Comments: Skin breakdown/hernia around stoma  Neurological:     Mental Status: He is alert.    Stoma type/location:  LLQ colostomy Stomal assessment/size:  oval stoma and mucus fistula 1.5 cm x 2.5 cm  Peristomal assessment:  nonintact skin at 3 and 9 o'clock, bleeds easily and is tender to touch.  We will be treating the areas of breakdown with Aquacel Ag and cover with barrier strips.  Treatment options for stomal/peristomal skin: aquacel Ag, barrier ring strips.  Stoma powder and skin prep to intact peristomal skin Output: soft brown stool Ostomy pouching: 1pc.convex  has not been wearing this and is experiencing frequent leaks.   Education provided:  See new pouching procedure.  Sent patient home with instructions and sent picture with instructions to son, Marc Mcdonald.  Supplies have been ordered today for 90 days.     Impression/dx  Peristomal hernia with full thickness tissue loss to peristomal skin Discussion  Must use convex pouch and belt.  Wound care now with Aquacel Ag Plan  Sent home with supplies. See back in office next Friday at 9 Am     Visit time: 70 minutes.    Domenic Moras FNP-BC

## 2020-09-28 ENCOUNTER — Other Ambulatory Visit: Payer: Self-pay

## 2020-09-28 ENCOUNTER — Ambulatory Visit (HOSPITAL_COMMUNITY)
Admission: RE | Admit: 2020-09-28 | Discharge: 2020-09-28 | Disposition: A | Discharge status: Home / Self Care | Payer: Medicare Other | Source: Ambulatory Visit | Attending: Nurse Practitioner | Admitting: Nurse Practitioner

## 2020-09-28 DIAGNOSIS — K9400 Colostomy complication, unspecified: Secondary | ICD-10-CM

## 2020-09-28 DIAGNOSIS — Z933 Colostomy status: Secondary | ICD-10-CM | POA: Diagnosis present

## 2020-09-28 DIAGNOSIS — K435 Parastomal hernia without obstruction or  gangrene: Secondary | ICD-10-CM | POA: Diagnosis not present

## 2020-09-28 NOTE — Progress Notes (Signed)
[facility]   Reason for visit:  Ongoing pouching issues with hernia.  As peristomal hernia flexes in and out, he is losing seal.  I am going to implement Sensura MIO "flip" pouch that should wrap around stoma and provide more secure seal.  HPI:  LLQ colostomy with peristomal hernia and denuded skin.  Today, breakdown is more extensive from 6 to 9 o'clock.  We will protect and crust this skin with skin prep and powder.  There are 2 areas of hypergranulation at 3 and 9 o'clock.  I have applied silver nitrate to these areas to cauterize.  I am dressing these areas with Aquacel Ag as this helped last time. He has declined because his pouch began leaking and he has not pouched it in the way I instructed last visit.   We perform this again ROS  Review of Systems Vital signs:  BP (!) 149/75 (BP Location: Right Arm)   Pulse 84   Temp 98.6 F (37 C) (Oral)   Resp 18   SpO2 99%  Exam:  Physical Exam Vitals reviewed.  Pulmonary:     Breath sounds: Rhonchi present.  Abdominal:     Tenderness: There is abdominal tenderness.       Comments: Peristomal hernia with peristomal skin breakdown.   Skin:    General: Skin is warm and dry.     Findings: Lesion present.          Comments: Contact dermatitis to periwound skin Peristomal hernia  Neurological:     Mental Status: He is alert.  Psychiatric:        Mood and Affect: Mood normal.        Behavior: Behavior normal.    Stoma type/location:  LLQ colostomy Stomal assessment/size:  oval stoma and mucus fistula 1.5 cm x 2.5 cm  Peristomal assessment:  see above  large protruding hernia present from 12-6 o'clock, denuded skin on opposite side Treatment options for stomal/peristomal skin: silver nitrate to hypergranulation, aquacel Ag over that with barrier ring strips covering.  Stoma powder and skin prep  to denuded skin.  Barrier ring around stoma.  Applied new MIO "flip" pouch designed for hernias/protrusions.  Applied [employer]  belt and he feels really secure.   Output:  soft brown stool 2 piece sensura MIO flip pouch with belt.  Ostomy pouching: 2 pc. With belt.  Education provided:  see above  Sent home with 3 additional pouch sets. I will enroll with [employer] to request samples.    * He has a productive cough. He has been less active due to fear of pouch leaking.  He has an incentive spirometer at home and agrees to use this. He will follow up with MD if not improved.  No fever, malaise, chills  Is a smoker.  Impression/dx  Peristomal hernia with denuded skin Discussion  New pouch.  Discussed with son and sent home with written instructions on use.  Plan  See back next Friday.     Visit time: 65 minutes.   Domenic Moras FNP-BC

## 2020-09-28 NOTE — Discharge Instructions (Signed)
We have switched to Sensura Mio Flip convex 2 piece with belt.  COntinue Aquacel Ag to open wounds, and cover with barrier ring strips.  powder and skin prep to redness Keep belt in place.  Back in one week.

## 2020-10-05 ENCOUNTER — Ambulatory Visit (HOSPITAL_COMMUNITY)
Admission: RE | Admit: 2020-10-05 | Discharge: 2020-10-05 | Disposition: A | Discharge status: Home / Self Care | Payer: Medicare Other | Source: Ambulatory Visit | Attending: Nurse Practitioner | Admitting: Nurse Practitioner

## 2020-10-05 NOTE — Progress Notes (Signed)
[facility]   Reason for visit:  Ongoing peristomal skin breakdown with hernia. Has upcoming surgical appointment. HPI:  LLQ colostomy with peristomal hernia.  Frequent leaks with full thickness tissue loss.  ROS  Review of Systems  Respiratory:  Positive for cough.   Gastrointestinal:        Peristomal hernia  Skin:  Positive for rash and wound.       Peristomal skin breakdown.   Psychiatric/Behavioral: Negative.    All other systems reviewed and are negative. Vital signs:  BP (!) 156/81   Pulse 100   Temp 98.1 F (36.7 C) (Oral)   Resp 18   Ht '5\' 11"'$  (1.803 m)   Wt 64.4 kg   SpO2 100%   BMI 19.80 kg/m  Exam:  Physical Exam Abdominal:     Comments: LLQ colostomy with peristomal hernia and skin breakdown.   Skin:        Stoma type/location:  LLQ colostomy  Stomal assessment/size:  oval stoma and mucus fistula 1.5 cm x 2.5 cm  Peristomal assessment:  see above  large protruding hernia present from 12-6 o'clock, denuded skin on opposite side New MIO pouch stayed in place for a week. Is leaking today and patient had just reinforced with tape.  Stool on skin  at 3 o'clock for prolonged period at previous areas of breakdown and they are now nonintact again. Not as tender as last time.  Skin has healed at 9 o'clock and redness remains circumferentially.  Treatment options for stomal/peristomal skin: aquacel Ag over nonintact lesions with barrier ring strips covering.  Stoma powder and skin prep  to denuded skin.  Barrier ring around stoma.   Peristomal assessment:  See above Treatment options for stomal/peristomal skin: Aquacel Ag to open wounds, cover with barrier ring.  Mio flip pouch with belt.  Samples on the way and will order with [employer].  Output: soft brown stool.  Ostomy pouching: 1pc.convex flip pouch for hernia with belt.  Barrier strips to [employer] provided:  continue Mio pouch, must change as soon as it leaks.  Spoke with son, who agrees.      Impression/dx  Peristomal hernia with skin breakdown Discussion  Pouching with leaks and twice weekly. Belt at all times.  Plan  Appointment made for 10 days to reassess. Patient anxious for reversal.     Visit time: 65 minutes.   Domenic Moras FNP-BC

## 2020-10-05 NOTE — Discharge Instructions (Signed)
Skin is looking a lot better!   Keep using Aquacel (gray square) over open wounds Barrier ring on top of aquacel and around stoma.  Use lubricating deodorant in pouch to help stool slide down Continue stool softeners.  Change pouch as soon as it leaks.  See back in office 10/16/20 at 9:00 (Tuesday)

## 2020-10-11 ENCOUNTER — Telehealth (HOSPITAL_BASED_OUTPATIENT_CLINIC_OR_DEPARTMENT_OTHER): Payer: Self-pay

## 2020-10-11 NOTE — Telephone Encounter (Signed)
Patient called to inquire if he could come  into the office to have labs collected that have been ordered by Dr. Harrell Gave (general Surgeon) I instructed patient that we do have the capability to collect labs ordered by another physician but that I would need to contact Dr. Lorre Nick office to get the office notes and lab orders. Patient is agreeable to be confirming the information before scheduling a nurse visit

## 2020-10-12 ENCOUNTER — Other Ambulatory Visit (HOSPITAL_BASED_OUTPATIENT_CLINIC_OR_DEPARTMENT_OTHER): Payer: Self-pay | Admitting: Family Medicine

## 2020-10-16 ENCOUNTER — Ambulatory Visit (HOSPITAL_COMMUNITY): Payer: Medicare Other

## 2020-10-22 ENCOUNTER — Other Ambulatory Visit (HOSPITAL_BASED_OUTPATIENT_CLINIC_OR_DEPARTMENT_OTHER): Payer: Self-pay | Admitting: Family Medicine

## 2020-10-23 ENCOUNTER — Encounter (HOSPITAL_COMMUNITY)
Admission: RE | Admit: 2020-10-23 | Discharge: 2020-10-23 | Disposition: A | Discharge status: Home / Self Care | Payer: Medicare Other | Source: Ambulatory Visit | Attending: Nurse Practitioner | Admitting: Nurse Practitioner

## 2020-10-23 ENCOUNTER — Other Ambulatory Visit: Payer: Self-pay

## 2020-10-23 NOTE — Discharge Instructions (Signed)
Appointment for 2 weeks.  Continue same bag.  Stop aquacel.  Skin prep and powder to skin around stoma.  Repeat twice.  Barrier ring around stoma.  Bandage to open sore.  DO not put pouch barrier over that.   Continue belt.

## 2020-10-31 ENCOUNTER — Ambulatory Visit (INDEPENDENT_AMBULATORY_CARE_PROVIDER_SITE_OTHER): Payer: Medicare Other | Admitting: Family Medicine

## 2020-10-31 ENCOUNTER — Encounter (HOSPITAL_BASED_OUTPATIENT_CLINIC_OR_DEPARTMENT_OTHER): Payer: Self-pay | Admitting: Family Medicine

## 2020-10-31 ENCOUNTER — Other Ambulatory Visit: Payer: Self-pay

## 2020-10-31 VITALS — BP 148/70 | HR 102 | Ht 71.0 in | Wt 139.0 lb

## 2020-10-31 DIAGNOSIS — Z939 Artificial opening status, unspecified: Secondary | ICD-10-CM

## 2020-10-31 DIAGNOSIS — Z933 Colostomy status: Secondary | ICD-10-CM | POA: Diagnosis not present

## 2020-10-31 DIAGNOSIS — Z01818 Encounter for other preprocedural examination: Secondary | ICD-10-CM | POA: Diagnosis not present

## 2020-10-31 NOTE — Patient Instructions (Signed)
  Medication Instructions:  Your physician recommends that you continue on your current medications as directed. Please refer to the Current Medication list given to you today. --If you need a refill on any your medications before your next appointment, please call your pharmacy first. If no refills are authorized on file call the office.--  Follow-Up: Your next appointment:   Your physician recommends that you schedule a follow-up appointment in: 6-8 weeks with Dr. de [place]  Thanks for letting us be apart of your health journey!!  Primary Care and Sports Medicine   Dr. Arlina Robes [place]   We encourage you to activate your patient portal called "MyChart".  Sign up information is provided on this After Visit Summary.  MyChart is used to connect with patients for Virtual Visits (Telemedicine).  Patients are able to view lab/test results, encounter notes, upcoming appointments, etc.  Non-urgent messages can be sent to your provider as well. To learn more about what you can do with MyChart, please visit --  [email or web address].     -- Call Dr. Orest Dikes Office and discuss how they want you to have the gastrograffin enema completed -- [phone] --

## 2020-11-01 LAB — NICOTINE SCREEN, URINE: Cotinine Ql Scrn, Ur: NEGATIVE ng/mL

## 2020-11-05 ENCOUNTER — Telehealth: Payer: Self-pay | Admitting: Hematology

## 2020-11-05 NOTE — Telephone Encounter (Signed)
Rescheduled upcoming appointment due to provider's breast clinic. Patient is aware of changes. 

## 2020-11-06 ENCOUNTER — Ambulatory Visit (HOSPITAL_COMMUNITY)
Admission: RE | Admit: 2020-11-06 | Discharge: 2020-11-06 | Disposition: A | Discharge status: Home / Self Care | Payer: Medicare Other | Source: Ambulatory Visit | Attending: Nurse Practitioner | Admitting: Nurse Practitioner

## 2020-11-06 ENCOUNTER — Other Ambulatory Visit: Payer: Self-pay

## 2020-11-06 DIAGNOSIS — K432 Incisional hernia without obstruction or gangrene: Secondary | ICD-10-CM

## 2020-11-06 DIAGNOSIS — K435 Parastomal hernia without obstruction or  gangrene: Secondary | ICD-10-CM | POA: Diagnosis present

## 2020-11-06 DIAGNOSIS — Z933 Colostomy status: Secondary | ICD-10-CM | POA: Insufficient documentation

## 2020-11-06 DIAGNOSIS — K9400 Colostomy complication, unspecified: Secondary | ICD-10-CM | POA: Diagnosis not present

## 2020-11-06 NOTE — Discharge Instructions (Signed)
We added an abdominal binder/belt today.  We cut to fit.  This should support the larger hernia better.   Continue the silver on the open wounds.

## 2020-11-06 NOTE — Progress Notes (Signed)
[facility]   Reason for visit:  Hernia larger, decreased pouch wear time. Peristomal breakdown reopened at 3 and 9 o'clock. The bandaged wound to distal perimeter has healed.  HPI:  LLQ colostomy with large parastomal hernia. Skin breakdown to peristomal skin.  ROS  Review of Systems  Respiratory:  Positive for cough.   Gastrointestinal:        Parastomal hernia in LLQ around colostomy.  Causes pain and pouch leakage.    Psychiatric/Behavioral:  The patient is nervous/anxious.        Wants reversal and hernia repair ASAP.    Vital signs:  BP (!) 158/74 (BP Location: Right Arm)   Pulse 87   Temp 98.2 F (36.8 C) (Oral)   Resp 18   SpO2 98%  Exam:  Physical Exam Abdominal:       Comments: LLQ colostomy with parastomal hernia.  Skin breakdown to peristomal skin at 3 and 9 o'clock.   Skin:    Findings: Wound present.    Stoma type/location:  LLQ  Stomal assessment/size:  Oval  2.4 cm x 3 cm  Peristomal assessment:  Nonintact lesions have reopened at 3 and 9 o'clock.  The distal lesion has healed.  Treatment options for stomal/peristomal skin: Stoma powder and skin prep. Aquacel to open wounds covered with barrier ring. Added abdominal binder/hernia belt today. Cut opening for pouch to exit.  Output: soft brown stool Ostomy pouching: 2pc. 2 3/4" pouch  Education provided:  we cut and applied new binder/belt.  Patient feels more supported .  He states surgeon is considering reversal and hernia repair as soon as October.     Impression/dx  Parastomal hernia with peristomal skin breakdown ongoing.  Discussion  How to apply new belt.  Wound care to open areas.  Plan  See back in one week.     Visit time: 50 minutes.   Domenic Moras FNP-BC

## 2020-11-07 ENCOUNTER — Other Ambulatory Visit (HOSPITAL_BASED_OUTPATIENT_CLINIC_OR_DEPARTMENT_OTHER): Payer: Self-pay

## 2020-11-07 MED ORDER — TRAZODONE HCL 50 MG PO TABS: ORAL_TABLET | ORAL | 1 refills | Status: DC

## 2020-11-08 ENCOUNTER — Other Ambulatory Visit: Payer: Self-pay | Admitting: Surgery

## 2020-11-08 DIAGNOSIS — Z933 Colostomy status: Secondary | ICD-10-CM

## 2020-11-13 ENCOUNTER — Other Ambulatory Visit (HOSPITAL_BASED_OUTPATIENT_CLINIC_OR_DEPARTMENT_OTHER): Payer: Self-pay | Admitting: Family Medicine

## 2020-11-13 ENCOUNTER — Ambulatory Visit (HOSPITAL_COMMUNITY)
Admission: RE | Admit: 2020-11-13 | Discharge: 2020-11-13 | Disposition: A | Discharge status: Home / Self Care | Payer: Medicare Other | Source: Ambulatory Visit | Attending: Nurse Practitioner | Admitting: Nurse Practitioner

## 2020-11-13 DIAGNOSIS — Z933 Colostomy status: Secondary | ICD-10-CM | POA: Diagnosis not present

## 2020-11-13 DIAGNOSIS — L24B1 Irritant contact dermatitis related to digestive stoma or fistula: Secondary | ICD-10-CM

## 2020-11-13 DIAGNOSIS — K9400 Colostomy complication, unspecified: Secondary | ICD-10-CM | POA: Diagnosis not present

## 2020-11-13 DIAGNOSIS — K432 Incisional hernia without obstruction or gangrene: Secondary | ICD-10-CM | POA: Diagnosis not present

## 2020-11-13 DIAGNOSIS — K435 Parastomal hernia without obstruction or  gangrene: Secondary | ICD-10-CM | POA: Insufficient documentation

## 2020-11-13 NOTE — Discharge Instructions (Signed)
Continue pouching and silver dressing to open wounds at 3 and 9o'clock.  Cover with barrier ring.  Skin prep and powder to red, irritated skin Wear new larger abdominal binder.

## 2020-11-13 NOTE — Progress Notes (Signed)
[facility]   Reason for visit:  Ongoing peristomal skin breakdown.  Parastomal hernia.  Increasing in size.  HPI:  LLQ colostomy with large parastomal hernia.  Skin breakdown remains at 3 and 9 o'clock   Some areas improved at perimeter.  ROS  Review of Systems  Respiratory:  Positive for cough and shortness of breath.   Gastrointestinal:        Parastomal hernia LLQ.  Painful at times.  Ostomy remains productive.   Skin:  Positive for wound.       Peristomal skin breakown  All other systems reviewed and are negative. Vital signs:  BP (!) 155/78 (BP Location: Right Arm)   Pulse (!) 103   Temp 98.7 F (37.1 C) (Oral)   Resp 20   SpO2 97%  Exam:  Physical Exam Abdominal:     General: Abdomen is protuberant.     Hernia: A hernia is present.  Skin:    Findings: Rash and wound present.    Stoma type/location:  LLQ colostomy Stomal assessment/size:  oval 2 cm x 3 cm pink and moist Peristomal assessment:  3 and 9 o'clock lesions remain (photo in chart)  the ostomy belt is likely applying pressure to these wounds and slowing healing.  I am going to transition to a larger abdominal binder/belt today.  As hernia grew, the initial binder became too small.  Treatment options for stomal/peristomal skin: Skin prep and powder to red, irritated skin.  Aquacel Ag to open wounds,cover with barrier ring.  Output: soft brown stool Ostomy pouching: 2pc. 2 3/4" pouch   stop ostomy belt and try abdominal binder only to relieve pressure to open wounds.  Education provided:  We sized and cut a new binder with opening for ostomy pouch.  Patient states this gives him support.     Impression/dx  Irritant dermatitis to peristomal skin Parastomal hernia Discussion  Stop ostomy belt and try new larger binder.  Wound care to open wounds.  Plan  See back in 2 weeks.     Visit time: 55 minutes.   Domenic Moras FNP-BC

## 2020-11-20 ENCOUNTER — Ambulatory Visit
Admission: RE | Admit: 2020-11-20 | Discharge: 2020-11-20 | Disposition: A | Discharge status: Home / Self Care | Payer: Medicare Other | Source: Ambulatory Visit | Attending: Surgery | Admitting: Surgery

## 2020-11-20 DIAGNOSIS — Z933 Colostomy status: Secondary | ICD-10-CM

## 2020-11-20 IMAGING — RF DG BE THRU COLOSTOMY
1 series · 15 of 16 positions shown · non-contrast
Comparison: None.

CLINICAL DATA: Colostomy for colon cancer. Status post low anterior
resection.

EXAM:
WATER SOLUBLE CONTRAST ENEMA
TECHNIQUE: A Foley catheter was placed into the rectum and the retention
balloon was insufflated. Under gravity, there was retrograde
opacification of the rectum.
FLUOROSCOPY TIME:  Fluoroscopy Time:  1 minute 18 seconds
Radiation Exposure Index (if provided by the fluoroscopic device):
19 mGy
Number of Acquired Spot Images: 0

[Series 1: one shot · 0.14mm/px · 15 of 16 slices shown]
[im 1/16]
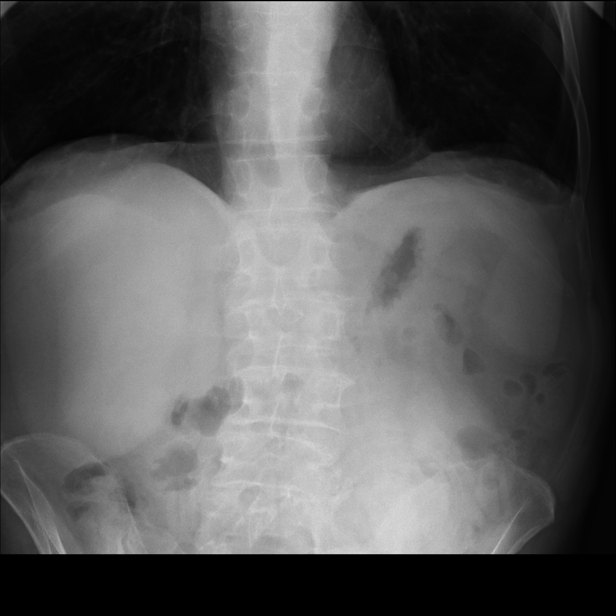
[im 2/16]
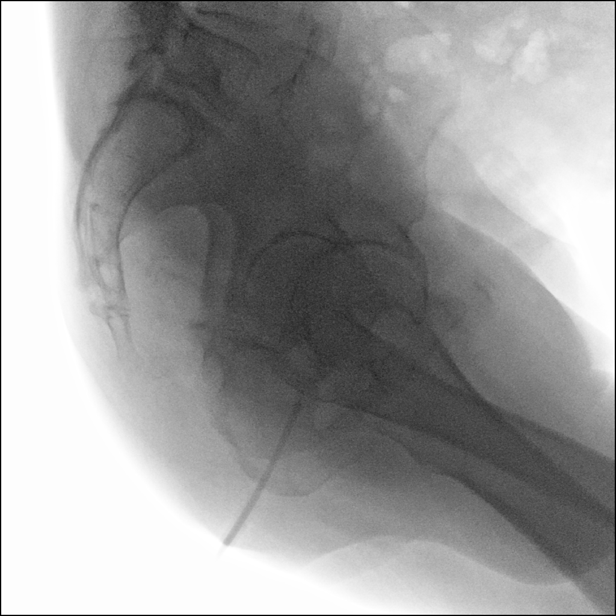
[im 3/16]
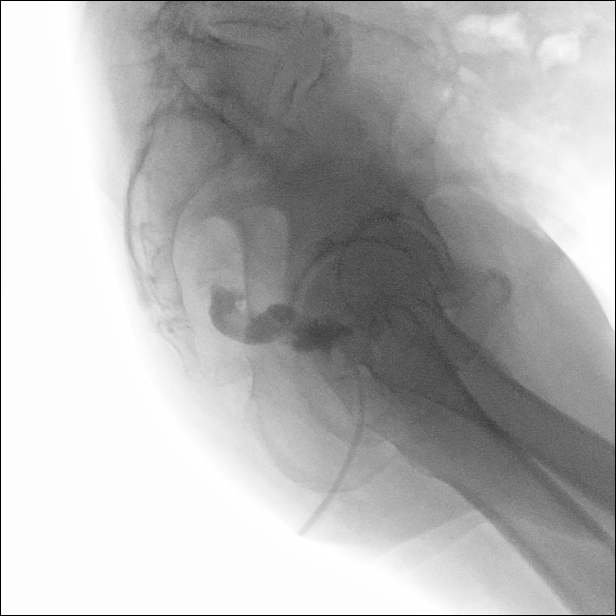
[im 4/16]
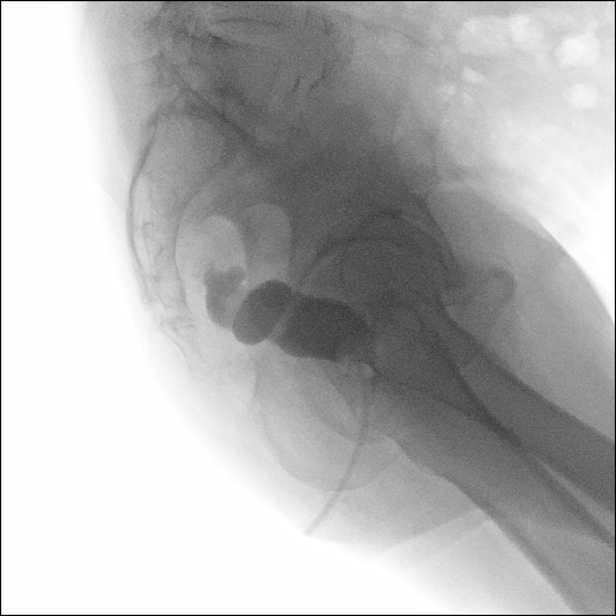
[im 5/16]
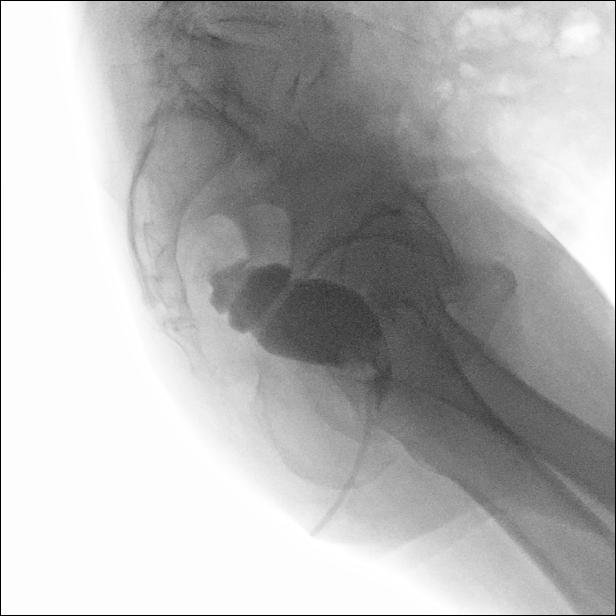
[im 6/16]
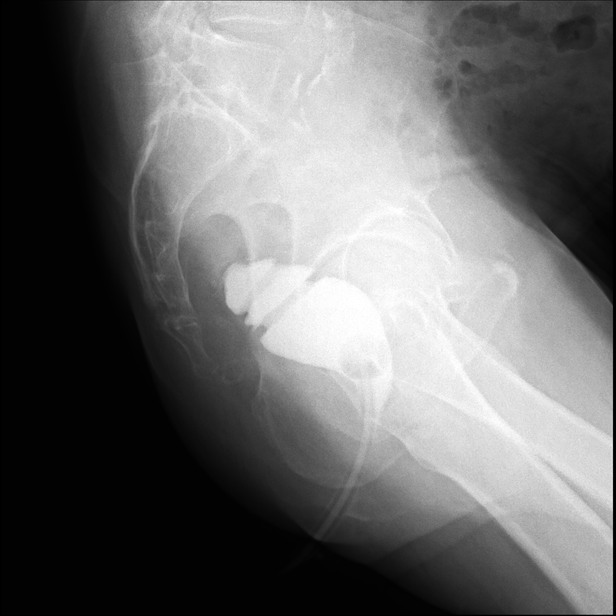
[im 7/16]
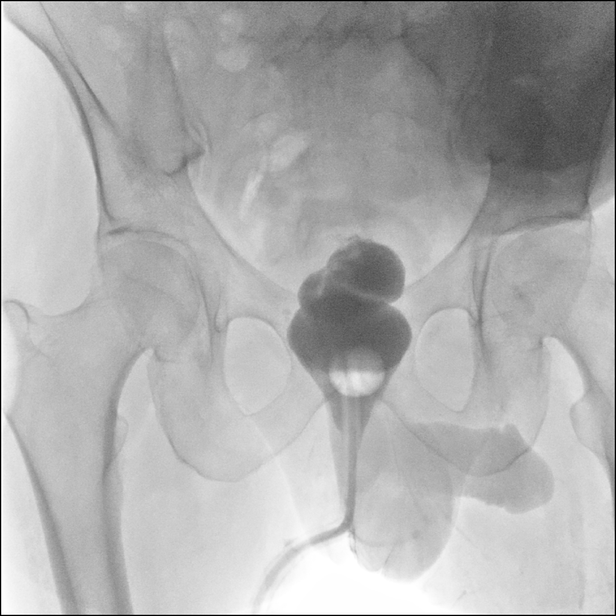
[im 9/16]
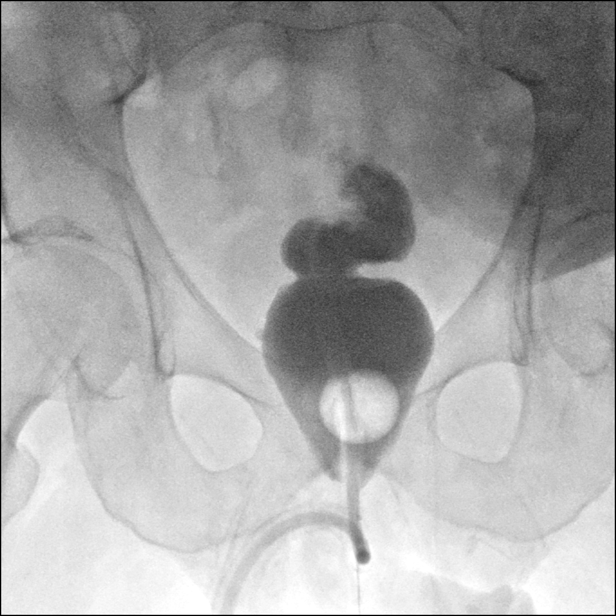
[im 10/16]
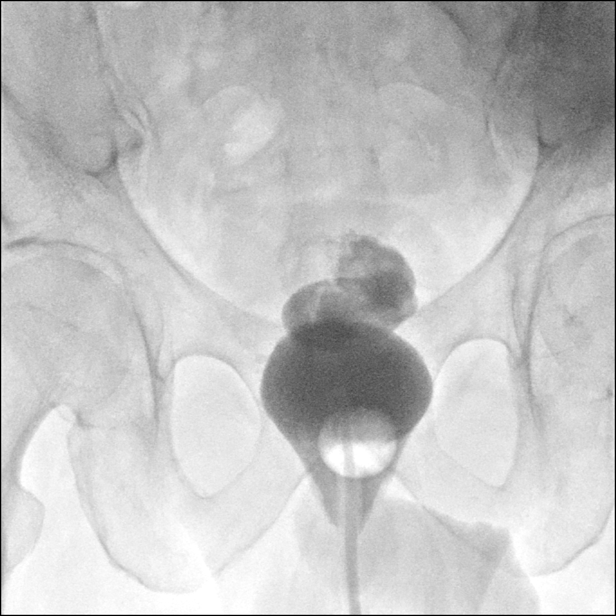
[im 11/16]
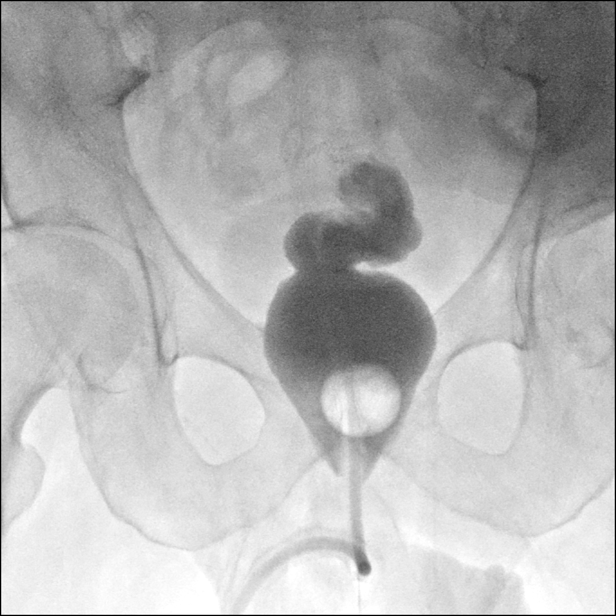
[im 12/16]
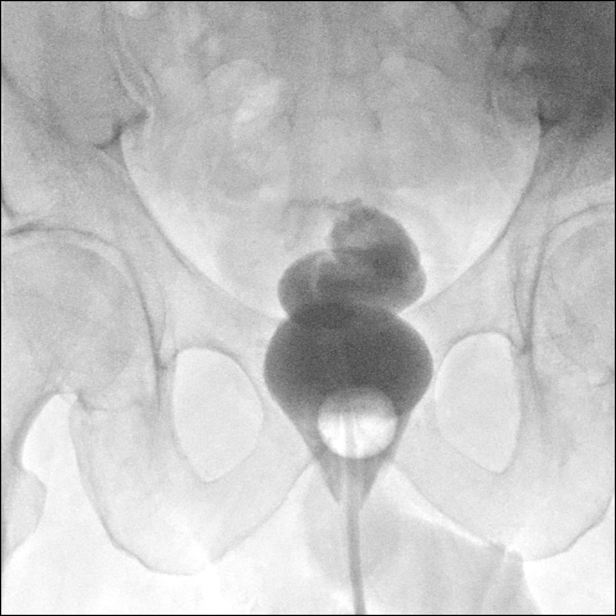
[im 13/16]
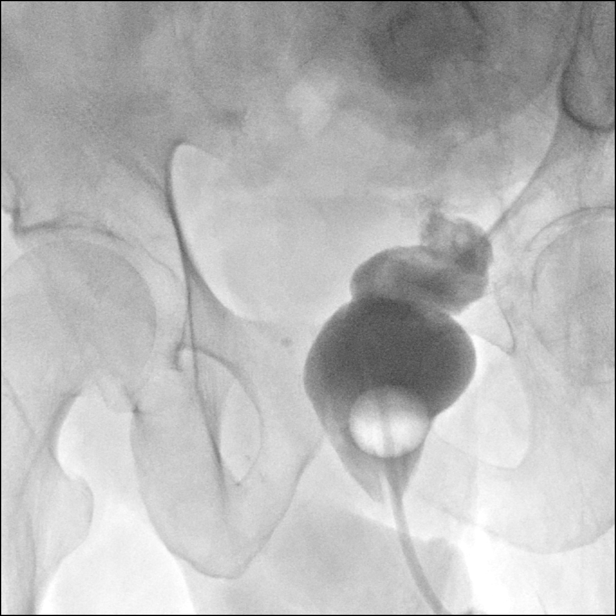
[im 14/16]
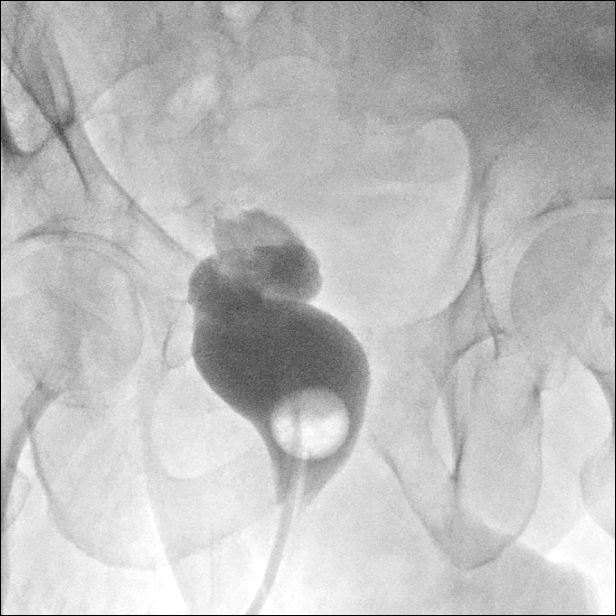
[im 15/16]
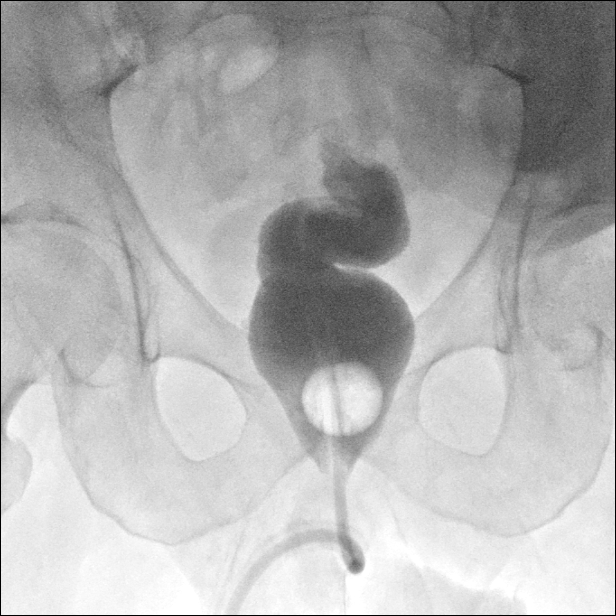
[im 16/16]
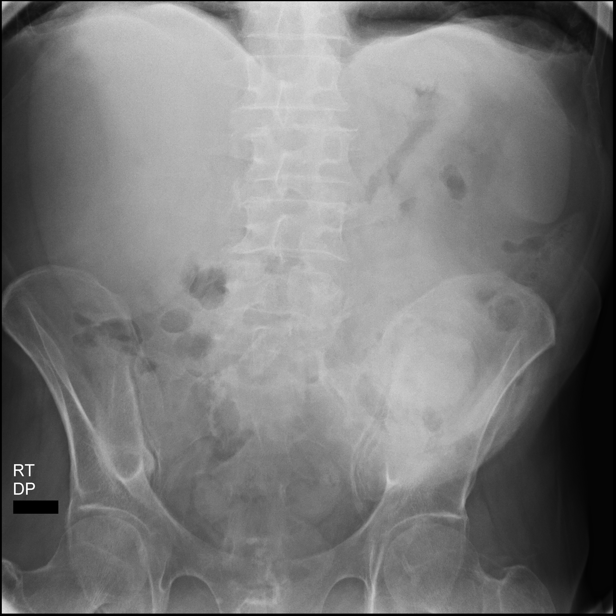

[15 of 16 positions shown; findings below may reference images not displayed]

FINDINGS: Normal retrograde opacification of the rectal pouch. No contour
abnormality identified. The suture line appears intact without signs
of extraluminal contrast extravasation. No focal filling defects
identified within the rectal pouch.
IMPRESSION: Unremarkable appearance of rectal pouch.

## 2020-11-27 ENCOUNTER — Ambulatory Visit (HOSPITAL_COMMUNITY)
Admission: RE | Admit: 2020-11-27 | Discharge: 2020-11-27 | Disposition: A | Discharge status: Home / Self Care | Payer: Medicare Other | Source: Ambulatory Visit | Attending: Nurse Practitioner | Admitting: Nurse Practitioner

## 2020-11-27 ENCOUNTER — Other Ambulatory Visit: Payer: Self-pay

## 2020-11-27 DIAGNOSIS — L24B3 Irritant contact dermatitis related to fecal or urinary stoma or fistula: Secondary | ICD-10-CM | POA: Diagnosis not present

## 2020-11-27 DIAGNOSIS — K435 Parastomal hernia without obstruction or  gangrene: Secondary | ICD-10-CM | POA: Diagnosis present

## 2020-11-27 DIAGNOSIS — L24B1 Irritant contact dermatitis related to digestive stoma or fistula: Secondary | ICD-10-CM

## 2020-11-27 DIAGNOSIS — K9409 Other complications of colostomy: Secondary | ICD-10-CM | POA: Insufficient documentation

## 2020-11-27 DIAGNOSIS — K9400 Colostomy complication, unspecified: Secondary | ICD-10-CM

## 2020-11-27 NOTE — Discharge Instructions (Signed)
Continue binder and silver dressing to wounds CAll Dr Dema Severin for surgical appointment. \

## 2020-11-27 NOTE — Progress Notes (Signed)
[facility]   Reason for visit:  LLQ colostomy with parastomal hernia.  Skin breakdown continues, but improving.  HPI:  LLQ colostomy with breakdown at 3 and 9 o'clock.  Medical adhesive related skin injury (MARSI) to perimeter of pouching area has resolved.  ROS  Review of Systems  Respiratory:  Positive for cough and shortness of breath.   Gastrointestinal:        LLQ colostomy with hernia present.  Skin breakdown to peristomal skin.   Skin:  Positive for rash and wound.  All other systems reviewed and are negative. Vital signs:  BP (!) 176/92 (BP Location: Right Arm)   Pulse 89   Temp 98 F (36.7 C) (Oral)   Resp 18   Ht 5\' 11"  (1.803 m)   Wt 63.5 kg   SpO2 96%   BMI 19.52 kg/m  Exam:  Physical Exam  Stoma type/location:  LLQ colostomy Stomal assessment/size:  2 cm x 3 cm oval pink and moist.  Productive Peristomal assessment:  Bulging around stoma, reduces when he lies down, protrudes when he gets up.  Wears hernia support belt.  Treatment options for stomal/peristomal skin: silver alginate to open wounds. Barrier ring, 2 piece pouch with barrier strips to perimeter of pouch for added support.  Output: soft brown stool Ostomy pouching: 2pc.  Education provided:  photo in chart.  Anticipates surgery this month.  Awaiting call from surgeon office.  Hernia belt very soiled.  GIven an additional belt today.     Impression/dx  Contact dermatitis, parastomal hernia creating complex pouching .  Discussion  COntinue wound care as ordered.  Follow up with surgeon's office.  Plan  See back in 2 weeks.  Sooner if needed.     Visit time: 50 minutes.   Domenic Moras FNP-BC

## 2020-12-13 ENCOUNTER — Ambulatory Visit (INDEPENDENT_AMBULATORY_CARE_PROVIDER_SITE_OTHER): Payer: Medicare Other | Admitting: Family Medicine

## 2020-12-13 ENCOUNTER — Other Ambulatory Visit (HOSPITAL_BASED_OUTPATIENT_CLINIC_OR_DEPARTMENT_OTHER): Payer: Self-pay

## 2020-12-13 ENCOUNTER — Encounter (HOSPITAL_BASED_OUTPATIENT_CLINIC_OR_DEPARTMENT_OTHER): Payer: Self-pay | Admitting: Family Medicine

## 2020-12-13 ENCOUNTER — Other Ambulatory Visit: Payer: Self-pay

## 2020-12-13 VITALS — BP 162/70 | HR 99 | Ht 71.0 in | Wt 140.0 lb

## 2020-12-13 DIAGNOSIS — K2100 Gastro-esophageal reflux disease with esophagitis, without bleeding: Secondary | ICD-10-CM

## 2020-12-13 DIAGNOSIS — Z939 Artificial opening status, unspecified: Secondary | ICD-10-CM | POA: Diagnosis not present

## 2020-12-13 DIAGNOSIS — D500 Iron deficiency anemia secondary to blood loss (chronic): Secondary | ICD-10-CM | POA: Diagnosis not present

## 2020-12-13 NOTE — Patient Instructions (Signed)
  Medication Instructions:  Your physician recommends that you continue on your current medications as directed. Please refer to the Current Medication list given to you today. --If you need a refill on any your medications before your next appointment, please call your pharmacy first. If no refills are authorized on file call the office.-- Follow-Up: Your next appointment:   Your physician recommends that you schedule a follow-up appointment in: 2-3 MONTHS with Dr. de [place]  You will receive a text message or e-mail with a link to a survey about your care and experience with [place] today! We would greatly appreciate your feedback!   Thanks for letting us be apart of your health journey!!  Primary Care and Sports Medicine   Dr. Arlina Robes [place]   We encourage you to activate your patient portal called "MyChart".  Sign up information is provided on this After Visit Summary.  MyChart is used to connect with patients for Virtual Visits (Telemedicine).  Patients are able to view lab/test results, encounter notes, upcoming appointments, etc.  Non-urgent messages can be sent to your provider as well. To learn more about what you can do with MyChart, please visit --  [email or web address].

## 2020-12-13 NOTE — Assessment & Plan Note (Signed)
Continues to follow with ostomy clinic, last appointment was about 2 weeks ago.  Next appointment is on Tuesday Still having some difficulty at time obtaining appropriate ostomy supplies Reports having colonoscopy completed with GI that was overall unremarkable Will be seeing general surgeon on November 1, he is hoping to discuss possibility of reversing the ostomy Continue follow-up with ostomy clinic, specialists

## 2020-12-13 NOTE — Progress Notes (Signed)
    Procedures performed today:    None.  Independent interpretation of notes and tests performed by another provider:   None.  Brief History, Exam, Impression, and Recommendations:    BP (!) 162/70   Pulse 99   Ht 5\' 11"  (1.803 m)   Wt 140 lb (63.5 kg)   SpO2 99%   BMI 19.53 kg/m   Gastroesophageal reflux disease with esophagitis without hemorrhage Continues with pantoprazole as prescribed Denies any current issues related to epigastric pain  History of creation of ostomy ([place]) Continues to follow with ostomy clinic, last appointment was about 2 weeks ago.  Next appointment is on Tuesday Still having some difficulty at time obtaining appropriate ostomy supplies Reports having colonoscopy completed with GI that was overall unremarkable Will be seeing general surgeon on November 1, he is hoping to discuss possibility of reversing the ostomy Continue follow-up with ostomy clinic, specialists  Iron deficiency anemia due to chronic blood loss Most recent labs a few months ago showed some improvement in anemia, but with continued low iron stores He has upcoming appointment with hematology/oncology in about 2 to 3 weeks Recommend continuing with this appointment, may consider IV iron infusion for further treatment of anemia  Plan for follow-up in about 2 to 3 months or sooner as needed   ___________________________________________ Meg Niemeier de [place], MD, ABFM, CAQSM Primary Care and [place]

## 2020-12-13 NOTE — Assessment & Plan Note (Signed)
Most recent labs a few months ago showed some improvement in anemia, but with continued low iron stores He has upcoming appointment with hematology/oncology in about 2 to 3 weeks Recommend continuing with this appointment, may consider IV iron infusion for further treatment of anemia

## 2020-12-13 NOTE — Assessment & Plan Note (Signed)
Continues with pantoprazole as prescribed Denies any current issues related to epigastric pain

## 2020-12-17 ENCOUNTER — Other Ambulatory Visit (HOSPITAL_BASED_OUTPATIENT_CLINIC_OR_DEPARTMENT_OTHER): Payer: Self-pay | Admitting: Family Medicine

## 2020-12-18 ENCOUNTER — Ambulatory Visit (HOSPITAL_COMMUNITY)
Admission: RE | Admit: 2020-12-18 | Discharge: 2020-12-18 | Disposition: A | Discharge status: Home / Self Care | Payer: Medicare Other | Source: Ambulatory Visit | Attending: Family Medicine | Admitting: Family Medicine

## 2020-12-18 ENCOUNTER — Other Ambulatory Visit: Payer: Self-pay

## 2020-12-18 DIAGNOSIS — L24B1 Irritant contact dermatitis related to digestive stoma or fistula: Secondary | ICD-10-CM

## 2020-12-18 DIAGNOSIS — K432 Incisional hernia without obstruction or gangrene: Secondary | ICD-10-CM

## 2020-12-18 DIAGNOSIS — K435 Parastomal hernia without obstruction or  gangrene: Secondary | ICD-10-CM | POA: Insufficient documentation

## 2020-12-18 DIAGNOSIS — Z933 Colostomy status: Secondary | ICD-10-CM | POA: Insufficient documentation

## 2020-12-18 DIAGNOSIS — K9400 Colostomy complication, unspecified: Secondary | ICD-10-CM | POA: Diagnosis not present

## 2020-12-18 NOTE — Progress Notes (Signed)
[facility]   Reason for visit:  LLQ colostomy with parastomal hernia HPI:  Colon Ca with resection, end colostomy ROS  Review of Systems  Respiratory:  Negative for shortness of breath.   Gastrointestinal:  Negative for abdominal distention.       Positive for parastomal hernia.  Complicating pouching and decreasing wear time.  Denuded peristomal skin  Skin:  Positive for wound.       Contact dermatitis present.  Wounds at 3 and 9 o'clock have resolved  All other systems reviewed and are negative. Vital signs:  BP (!) 168/90 (BP Location: Right Arm)   Pulse 97   Temp 98.1 F (36.7 C) (Oral)   Resp 18   SpO2 97%  Exam:  Physical Exam Pulmonary:     Comments: Shortness of breath with walking Abdominal:     Hernia: A hernia is present.  Neurological:     Mental Status: He is alert.  Psychiatric:        Mood and Affect: Mood normal.    Stoma type/location:  LLQ colostomy with large parastomal hernia.  Photo obtained today Patient states stoma protrudes out several inches at times.  The weight of this causes bag to pop off.  Stomal assessment/size:  oval 2.2 cm x 4 cm  Peristomal assessment:  denuded skin to peristomal skin circumferentially.  Wounds at 3 and 9 o'clock have healed.  Treatment options for stomal/peristomal skin: barrier ring, skin prep and stoma powder  2 1/4" pouch with ostomy belt. Wears abdominal binder to support hernia Output: soft brown stool Ostomy pouching:  2pc.  2 1/4" pouch Despite 5 pages of detailed faxes to [employer], patient has been sent incorrect supplies again.  2 1/4" barriers with 2 3/4" pouch that are not compatible.  I provide him with the needed matching pieces for each to make 10 pouch sets.  I will call [facility] once again to try and correct this.  Patient admits to being agitated and shouting at the representative.  Education provided:  See above.     Impression/dx  Contact dermatitis  Parastomal hernia Discussion  See  back in 2 weeks.  Follow up with surgery 12/25/20 Plan  Appointment 01/01/21    Visit time: 40 minutes.   Domenic Moras FNP-BC

## 2020-12-18 NOTE — Discharge Instructions (Signed)
Continue pouching Can stop silver dressing, wounds have healed.  Continue belt Follow up with surgery appointment regarding reversal/hernia repair.

## 2020-12-27 NOTE — Assessment & Plan Note (Addendum)
Patient still desiring to have ostomy reversed, will be meeting with surgeon to further discuss/ Patient is getting labs completed in order to move forward with consideration of ostomy reversal Continues to follow with ostomy clinic, he is occasionally having issues regarding obtaining appropriate supplies Still with good output into ostomy Oncology follow-up is in November.

## 2020-12-27 NOTE — Progress Notes (Signed)
    Procedures performed today:    None.  Independent interpretation of notes and tests performed by another provider:   None.  Brief History, Exam, Impression, and Recommendations:    BP (!) 148/70   Pulse (!) 102   Ht 5\' 11"  (1.803 m)   Wt 139 lb (63 kg)   SpO2 98%   BMI 19.39 kg/m   History of creation of ostomy [facility]) Patient still desiring to have ostomy reversed, will be meeting with surgeon to further discuss/ Patient is getting labs completed in order to move forward with consideration of ostomy reversal Continues to follow with ostomy clinic, he is occasionally having issues regarding obtaining appropriate supplies Still with good output into ostomy Oncology follow-up is in November.  Plan for follow-up with me in about 4 weeks or sooner as needed  ___________________________________________ Aneisa Karren de [place], MD, ABFM, [facility] and [place]

## 2020-12-31 ENCOUNTER — Telehealth: Payer: Self-pay

## 2020-12-31 NOTE — Telephone Encounter (Signed)
Pt called wanting to know did he need to keep his appt with Dr. Burr Medico.  Dr. Burr Medico last saw the pt in clinic on 07/02/2020.  Pt's LOS stated to see pt in 30months with labs.

## 2021-01-01 ENCOUNTER — Ambulatory Visit (HOSPITAL_COMMUNITY)
Admission: RE | Admit: 2021-01-01 | Discharge: 2021-01-01 | Disposition: A | Discharge status: Home / Self Care | Payer: Medicare Other | Source: Ambulatory Visit | Attending: Nurse Practitioner | Admitting: Nurse Practitioner

## 2021-01-01 DIAGNOSIS — Z433 Encounter for attention to colostomy: Secondary | ICD-10-CM | POA: Insufficient documentation

## 2021-01-01 DIAGNOSIS — K435 Parastomal hernia without obstruction or  gangrene: Secondary | ICD-10-CM | POA: Insufficient documentation

## 2021-01-01 DIAGNOSIS — L24B1 Irritant contact dermatitis related to digestive stoma or fistula: Secondary | ICD-10-CM

## 2021-01-01 DIAGNOSIS — L259 Unspecified contact dermatitis, unspecified cause: Secondary | ICD-10-CM | POA: Diagnosis not present

## 2021-01-01 DIAGNOSIS — K9400 Colostomy complication, unspecified: Secondary | ICD-10-CM | POA: Diagnosis not present

## 2021-01-01 NOTE — Progress Notes (Signed)
[facility]   Reason for visit:  LLQ colostomy with parastomal hernia. Ongoing skin breakdown, improving.   HPI:  Low anterior resection with end colostomy ROS  Review of Systems  Gastrointestinal:  Positive for abdominal distention.       Parastomal hernia  Skin:  Positive for rash.       Dermatitis to peristomal skin  Psychiatric/Behavioral:         Anticipates reversal surgery next month.   All other systems reviewed and are negative. Vital signs:  BP (!) 164/100 (BP Location: Right Arm)   Pulse 98   Temp 98.6 F (37 C) (Oral)   Resp 18   SpO2 96%  Exam:  Physical Exam Constitutional:      Appearance: He is normal weight.  Pulmonary:     Effort: Pulmonary effort is normal.     Comments: Chronic cough Abdominal:     Hernia: A hernia is present.  Skin:    Findings: Rash present.     Comments: Peristomal irritation  Neurological:     General: No focal deficit present.     Mental Status: He is alert and oriented to person, place, and time.  Psychiatric:        Mood and Affect: Mood normal.    Stoma type/location:  LLQ colostomy Stomal assessment/size:  2" pink and moist Peristomal assessment:  Denuded skin has all resolved.  Is still red and irritated easily. Protects with barrier ring, stoma powder and skin prep.   Treatment options for stomal/peristomal skin: Wears 2 piece pouch, belt and abdominal binder.  Photo in chart of large protruding hernia.  Painful at times.  Output: Soft brown stool Ostomy pouching: 2pc. See above Education provided:  He has reversal scheduled for late December.  He has to follow up with Dr Margit Banda for labs. He understands this and has an appointment.     Impression/dx  Parastomal hernia Contact dermatitis, resolving Discussion  Continue same pouch as before.  Plan  Given skin prep as requested  Has other supplies.     Visit time: 45 minutes.   Domenic Moras FNP-BC

## 2021-01-01 NOTE — Discharge Instructions (Signed)
Call clinic as needed.   Reversal next month!

## 2021-01-02 ENCOUNTER — Other Ambulatory Visit: Payer: Self-pay

## 2021-01-02 ENCOUNTER — Other Ambulatory Visit: Payer: Medicare Other

## 2021-01-02 ENCOUNTER — Ambulatory Visit: Payer: Medicare Other | Admitting: Hematology

## 2021-01-02 DIAGNOSIS — C19 Malignant neoplasm of rectosigmoid junction: Secondary | ICD-10-CM

## 2021-01-02 NOTE — Progress Notes (Signed)
CBC, CMP, and CEA labs placed per Dr.Feng last note for future visit.

## 2021-01-03 ENCOUNTER — Encounter: Payer: Self-pay | Admitting: Hematology

## 2021-01-03 ENCOUNTER — Inpatient Hospital Stay: Payer: Medicare Other | Admitting: Hematology

## 2021-01-03 ENCOUNTER — Other Ambulatory Visit: Payer: Self-pay

## 2021-01-03 ENCOUNTER — Inpatient Hospital Stay: Payer: Medicare Other | Attending: Hematology

## 2021-01-03 VITALS — BP 176/88 | HR 96 | Temp 98.6°F | Resp 17 | Ht 71.0 in | Wt 142.0 lb

## 2021-01-03 DIAGNOSIS — C19 Malignant neoplasm of rectosigmoid junction: Secondary | ICD-10-CM

## 2021-01-03 DIAGNOSIS — Z87891 Personal history of nicotine dependence: Secondary | ICD-10-CM | POA: Diagnosis not present

## 2021-01-03 DIAGNOSIS — F1010 Alcohol abuse, uncomplicated: Secondary | ICD-10-CM | POA: Insufficient documentation

## 2021-01-03 LAB — CBC WITH DIFFERENTIAL (CANCER CENTER ONLY)
Abs Immature Granulocytes: 0.03 10*3/uL (ref 0.00–0.07)
Basophils Absolute: 0.1 10*3/uL (ref 0.0–0.1)
Basophils Relative: 1 %
Eosinophils Absolute: 0.7 10*3/uL — ABNORMAL HIGH (ref 0.0–0.5)
Eosinophils Relative: 8 %
HCT: 43.5 % (ref 39.0–52.0)
Hemoglobin: 14.6 g/dL (ref 13.0–17.0)
Immature Granulocytes: 0 %
Lymphocytes Relative: 19 %
Lymphs Abs: 1.8 10*3/uL (ref 0.7–4.0)
MCH: 29.8 pg (ref 26.0–34.0)
MCHC: 33.6 g/dL (ref 30.0–36.0)
MCV: 88.8 fL (ref 80.0–100.0)
Monocytes Absolute: 0.9 10*3/uL (ref 0.1–1.0)
Monocytes Relative: 10 %
Neutro Abs: 5.7 10*3/uL (ref 1.7–7.7)
Neutrophils Relative %: 62 %
Platelet Count: 258 10*3/uL (ref 150–400)
RBC: 4.9 MIL/uL (ref 4.22–5.81)
RDW: 18.6 % — ABNORMAL HIGH (ref 11.5–15.5)
WBC Count: 9.2 10*3/uL (ref 4.0–10.5)
nRBC: 0 % (ref 0.0–0.2)

## 2021-01-03 LAB — CMP (CANCER CENTER ONLY)
ALT: 12 U/L (ref 0–44)
AST: 23 U/L (ref 15–41)
Albumin: 3.9 g/dL (ref 3.5–5.0)
Alkaline Phosphatase: 93 U/L (ref 38–126)
Anion gap: 12 (ref 5–15)
BUN: 7 mg/dL — ABNORMAL LOW (ref 8–23)
CO2: 23 mmol/L (ref 22–32)
Calcium: 9 mg/dL (ref 8.9–10.3)
Chloride: 105 mmol/L (ref 98–111)
Creatinine: 0.72 mg/dL (ref 0.61–1.24)
GFR, Estimated: >60 mL/min (ref >60)
Glucose, Bld: 109 mg/dL — ABNORMAL HIGH (ref 70–99)
Potassium: 3.9 mmol/L (ref 3.5–5.1)
Sodium: 140 mmol/L (ref 135–145)
Total Bilirubin: 1.4 mg/dL — ABNORMAL HIGH (ref 0.3–1.2)
Total Protein: 7.8 g/dL (ref 6.5–8.1)

## 2021-01-03 LAB — CEA (IN HOUSE-CHCC): CEA (CHCC-In House): 4.49 ng/mL (ref 0.00–5.00)

## 2021-01-03 MED ORDER — AMLODIPINE BESYLATE 5 MG PO TABS: 5.0000 mg | ORAL_TABLET | Freq: Every day | ORAL | 2 refills | Status: DC

## 2021-01-03 NOTE — Progress Notes (Signed)
[facility]   Telephone:([phone]) [phone] Fax:([phone]) [phone]   Clinic Follow up Note   Patient Care Team: [place], Blondell Reveal, MD as PCP - General (Family Medicine) Truitt Merle, MD as Consulting Physician (Oncology) Jonnie Finner, RN (Inactive) as Oncology Nurse Navigator  Date of Service:  01/03/2021  CHIEF COMPLAINT: f/u of colorectal cancer  CURRENT THERAPY:  Surveillance  ASSESSMENT & PLAN:  Marc Mcdonald is a 65 y.o. male with   1. Sigmoid colon cancer, pT3N0M0, stage II, MSS  -He was recently diagnosed with sigmoid cancer in 05/2020. His 05/26/20 upper endoscopy/colonoscopy showed at least intramucosal adenocarcinoma of rectosigmoid mass.  -He underwent colorectal surgery on 05/30/20 by Dr Dema Severin with colostomy. Surgical path showed 4.6cm of invasive adenocarcinoma that invades through the muscularis propria. Clear margins and LN negative. NO high risk features -CT Chest from 07/02/20 showed no evidence of metastasis. -adjuvant chemo not highly recommended -despite 20% risk of recurrence, he declines close surveillance. He agrees to annual surveillance scan. -he is scheduled for colostomy takedown and hernia repair on 02/13/21 with Dr. Dema Severin. -lab and f/u in 6 months after scan.  2. High BP -his BP has been persistently high since his colon surgery 08/2020, possible related to ostomy related pain  -I will prescribe amlodipine for him today. His PCP will take over management.    2. Severe symptomatic anemia, from iron deficiency and rectal cancer -He was treated with IV Feraheme on 05/27/20 and blood transfusions, last on 06/25/20.  -resolved since surgery   3. Comorbidities: COPD, Duodenal ulcer, Alcohol abuse, history of heavy smoking. -f/u with PCP  -he has wheezing on exam today. He stopped smoking about 2.5 years ago.     PLAN:  -lab and restaging CT and lab in 5 months -f/u several days later -I called in amlodipine today, and encourage him to f/u with PCP    No problem-specific Assessment & Plan notes found for this encounter.   SUMMARY OF ONCOLOGIC HISTORY: Oncology History Overview Note  Cancer Staging sigmoid colon cancer Staging form: Colon and Rectum, AJCC 8th Edition - Pathologic stage from 05/30/2020: Stage IIA (pT3, pN0, cM0) - Signed by Truitt Merle, MD on 06/04/2020 Stage prefix: Initial diagnosis Total positive nodes: 0 Histologic grading system: 4 grade system Histologic grade (G): G2 Residual tumor (R): R0 - None    sigmoid colon cancer  05/26/2020 Procedure   Upper Endoscopy by Dr Bryan Lemma  IMPRESSION - Benign-appearing esophageal stenosis. Dilated. - LA Grade B reflux esophagitis with no bleeding. - 5 cm hiatal hernia. - Gastritis. Biopsied. - Erythematous duodenopathy. Biopsied. - Non-bleeding duodenal ulcer with no stigmata of bleeding. - Normal second portion of the duodenum.   05/26/2020 Procedure   Colonoscopy by Dr Bryan Lemma  IMPRESSION Hemorrhoids found on perianal exam. - Malignant partially obstructing tumor in the proximal rectum. Biopsied. Tattooed. - Non-bleeding internal hemorrhoids. - The examined portion of the ileum was normal.   05/26/2020 Initial Biopsy   FINAL MICROSCOPIC DIAGNOSIS:   A. DUODENUM, BIOPSY:  - Gastric heterotopia.  - Warthin-Starry stain is negative for Helicobacter pylori.   B. STOMACH, BIOPSY:  - Mild chronic gastritis.  - Warthin-Starry stain is negative for Helicobacter pylori.   C. RECTAL MASS, BIOPSY:  - At least intramucosal adenocarcinoma, see comment.   COMMENT:   C. Depth of invasion cannot be accurately assessed due to the  superficial nature of the biopsy.   Dr. Vic Ripper reviewed.   05/27/2020 Imaging   MRI Pelvis  IMPRESSION: Findings of T3 B, N0, Mx colorectal cancer. Though tumor is more compatible with a sigmoid rather than rectal cancer based on position of tumor as compared to the anterior peritoneal reflection. Note that the exam shows limited  assessment with respect to anatomic boundaries due to motion artifact.   05/30/2020 Cancer Staging   Staging form: Colon and Rectum, AJCC 8th Edition - Pathologic stage from 05/30/2020: Stage IIA (pT3, pN0, cM0) - Signed by Truitt Merle, MD on 06/04/2020 Stage prefix: Initial diagnosis Total positive nodes: 0 Histologic grading system: 4 grade system Histologic grade (G): G2 Residual tumor (R): R0 - None    05/30/2020 Surgery   XI ROBOTIC ASSISTED LOWER ANTERIOR RESECTION WITH END COLOSTOMY CREATION and COLOSTOMY by Dr Dema Severin    05/30/2020 Pathology Results   FINAL MICROSCOPIC DIAGNOSIS:   A. RECTOSIGMOID COLON, LOW ANTERIOR RESECTION:  - Invasive colonic adenocarcinoma, 4.6 cm.  - Tumor invades through the muscularis propria into pericolonic tissues.  - Margins of resection are not involved.  - Twenty-three lymph nodes, negative for carcinoma (0/23).  - See oncology table.    Mismatch Repair Protein (IHC)   SUMMARY INTERPRETATION: NORMAL   IHC EXPRESSION RESULTS  TEST           RESULT  MLH1:          Preserved nuclear expression  MSH2:          Preserved nuclear expression  MSH6:          Preserved nuclear expression  PMS2:          Preserved nuclear expression     06/03/2020 Initial Diagnosis   sigmoid colon cancer   06/04/2020 Imaging   CT AP IMPRESSION: 1. Status post interval rectosigmoid colon resection with left lower quadrant end ostomy and surgical drain in the low pelvis. 2. The stomach and small bowel are mildly distended and fluid-filled throughout, largest loops measuring up to 3.8 cm. Findings are consistent with postoperative ileus. 3. No evidence of postoperative fluid collection or other complication. 4. New atelectasis or consolidation of the right lung base. New trace left pleural effusion.   Aortic Atherosclerosis (ICD10-I70.0).     06/11/2020 Imaging   CT AP  IMPRESSION: 1. Small-bowel obstruction with transition zone in the mid ileum. Previously  placed drain has been removed. No evidence of intra-abdominal abscess. 2. Atelectasis at the right lung base. Small amount of pleural fluid on the right. 3. Aortic atherosclerosis.   Aortic Atherosclerosis (ICD10-I70.0).      INTERVAL HISTORY:  Marc Mcdonald is here for a follow up of colorectal cancer. He was last seen by me on 07/02/20. He presents to the clinic accompanied by his son. He reports he is doing well overall. He notes pain to his hernia site. He reports his bowel movements have been good. His BP is high every time he comes here. His son reports his PCP is not too concerned as it has not been too high. Looking back on his records, it has been abnormal since 08/2020. He reports he is taking tylenol frequently due to the pain from the hernia. He notes he is followed by wound care for the skin around his ostomy because of the girdle he has to wear for the hernia, which has also been present since 08/2020. They report the home care nurse did not put in an O ring, which has caused the irritation.   All other systems were reviewed with the patient and are negative.  MEDICAL HISTORY:  Past Medical History:  Diagnosis Date   Colon cancer ([place])    COPD (chronic obstructive pulmonary disease) ([place])    Duodenal ulcer     SURGICAL HISTORY: Past Surgical History:  Procedure Laterality Date   BALLOON DILATION N/A 05/26/2020   Procedure: BALLOON DILATION;  Surgeon: Lavena Bullion, DO;  Location: [facility];  Service: Endoscopy;  Laterality: N/A;   BIOPSY  05/26/2020   Procedure: BIOPSY;  Surgeon: Lavena Bullion, DO;  Location: [place];  Service: Endoscopy;;   COLONOSCOPY WITH PROPOFOL N/A 05/26/2020   Procedure: COLONOSCOPY WITH PROPOFOL;  Surgeon: Lavena Bullion, DO;  Location: [facility];  Service: Endoscopy;  Laterality: N/A;   COLOSTOMY N/A 05/30/2020   Procedure: COLOSTOMY;  Surgeon: Ileana Roup, MD;  Location: [place];  Service: General;  Laterality: N/A;    ESOPHAGOGASTRODUODENOSCOPY (EGD) WITH PROPOFOL N/A 05/26/2020   Procedure: ESOPHAGOGASTRODUODENOSCOPY (EGD) WITH PROPOFOL;  Surgeon: Lavena Bullion, DO;  Location: [place];  Service: Endoscopy;  Laterality: N/A;   SUBMUCOSAL TATTOO INJECTION  05/26/2020   Procedure: SUBMUCOSAL TATTOO INJECTION;  Surgeon: Lavena Bullion, DO;  Location: [facility];  Service: Endoscopy;;   XI ROBOTIC ASSISTED LOWER ANTERIOR RESECTION N/A 05/30/2020   Procedure: XI ROBOTIC ASSISTED LOWER ANTERIOR RESECTION WITH END COLOSTOMY CREATION;  Surgeon: Ileana Roup, MD;  Location: [place];  Service: General;  Laterality: N/A;    I have reviewed the social history and family history with the patient and they are unchanged from previous note.  ALLERGIES:  has No Known Allergies.  MEDICATIONS:  Current Outpatient Medications  Medication Sig Dispense Refill   amLODipine (NORVASC) 5 MG tablet Take 1 tablet (5 mg total) by mouth daily. 30 tablet 2   acetaminophen (TYLENOL) 325 MG tablet Take 2 tablets (650 mg total) by mouth every 6 (six) hours. 30 tablet 0   Multiple Vitamins-Minerals (MULTIVITAMIN WITH MINERALS) tablet Take 1 tablet by mouth daily. 30 tablet 3   pantoprazole (PROTONIX) 40 MG tablet TAKE 1 TABLET BY MOUTH EVERY DAY 30 tablet 3   traZODone (DESYREL) 50 MG tablet TAKE 1 TABLET BY MOUTH EVERYDAY AT BEDTIME 90 tablet 1   No current facility-administered medications for this visit.    PHYSICAL EXAMINATION: ECOG PERFORMANCE STATUS: 1 - Symptomatic but completely ambulatory  Vitals:   01/03/21 0819  BP: (!) 176/88  Pulse: 96  Resp: 17  Temp: 98.6 F (37 C)  SpO2: 97%   Wt Readings from Last 3 Encounters:  01/03/21 142 lb (64.4 kg)  12/13/20 140 lb (63.5 kg)  11/27/20 139 lb 15.9 oz (63.5 kg)     GENERAL:alert, no distress and comfortable SKIN: skin color, texture, turgor are normal, (+) erythema around ostomy site EYES: normal, Conjunctiva are pink and non-injected, sclera clear   NECK: supple, thyroid normal size, non-tender, (+) palpable mass that feels like fatty tissue LYMPH:  no palpable lymphadenopathy in the cervical, axillary LUNGS: normal breathing effort, (+) wheezing present HEART: regular rate & rhythm and no murmurs and no lower extremity edema ABDOMEN:abdomen soft, non-tender and normal bowel sounds Musculoskeletal:no cyanosis of digits and no clubbing  NEURO: alert & oriented x 3 with fluent speech, no focal motor/sensory deficits  LABORATORY DATA:  I have reviewed the data as listed CBC Latest Ref Rng & Units 01/03/2021 08/28/2020 06/21/2020  WBC 4.0 - 10.5 K/uL 9.2 9.4 5.0  Hemoglobin 13.0 - 17.0 g/dL 14.6 9.9(L) 8.8(L)  Hematocrit 39.0 - 52.0 % 43.5 31.7(L) 29.1(L)  Platelets 150 - 400 K/uL 258 454.0(H) 355     CMP Latest Ref Rng & Units 01/03/2021 08/28/2020 06/20/2020  Glucose 70 - 99 mg/dL 109(H) 98 103(H)  BUN 8 - 23 mg/dL 7(L) 6 9  Creatinine 0.61 - 1.24 mg/dL 0.72 0.55 0.41(L)  Sodium 135 - 145 mmol/L 140 139 134(L)  Potassium 3.5 - 5.1 mmol/L 3.9 3.8 4.3  Chloride 98 - 111 mmol/L 105 103 101  CO2 22 - 32 mmol/L 23 28 27   Calcium 8.9 - 10.3 mg/dL 9.0 9.2 8.2(L)  Total Protein 6.5 - 8.1 g/dL 7.8 7.3 -  Total Bilirubin 0.3 - 1.2 mg/dL 1.4(H) 1.1 -  Alkaline Phos 38 - 126 U/L 93 77 -  AST 15 - 41 U/L 23 12 -  ALT 0 - 44 U/L 12 6 -      RADIOGRAPHIC STUDIES: I have personally reviewed the radiological images as listed and agreed with the findings in the report. No results found.    Orders Placed This Encounter  Procedures   CT CHEST ABDOMEN PELVIS W CONTRAST    Standing Status:   Future    Standing Expiration Date:   01/03/2022    Order Specific Question:   Preferred imaging location?    Answer:   [facility]    Order Specific Question:   Is Oral Contrast requested for this exam?    Answer:   Yes, Per Radiology protocol   All questions were answered. The patient knows to call the clinic with any problems, questions or  concerns. No barriers to learning was detected. The total time spent in the appointment was 30 minutes.     Truitt Merle, MD 01/03/2021   I, Wilburn Mylar, am acting as scribe for Truitt Merle, MD.   I have reviewed the above documentation for accuracy and completeness, and I agree with the above.

## 2021-01-17 ENCOUNTER — Other Ambulatory Visit (HOSPITAL_BASED_OUTPATIENT_CLINIC_OR_DEPARTMENT_OTHER): Payer: Self-pay | Admitting: Family Medicine

## 2021-01-23 NOTE — Progress Notes (Signed)
Sent message, via epic in basket, requesting orders in epic from surgeon.  

## 2021-01-29 ENCOUNTER — Ambulatory Visit: Payer: Self-pay | Admitting: Surgery

## 2021-01-29 DIAGNOSIS — Z01818 Encounter for other preprocedural examination: Secondary | ICD-10-CM

## 2021-01-31 NOTE — Patient Instructions (Signed)
DUE TO COVID-19 ONLY ONE VISITOR IS ALLOWED TO COME WITH YOU AND STAY IN THE WAITING ROOM ONLY DURING PRE OP AND PROCEDURE.   **NO VISITORS ARE ALLOWED IN THE SHORT STAY AREA OR RECOVERY ROOM!!**  IF YOU WILL BE ADMITTED INTO THE HOSPITAL YOU ARE ALLOWED ONLY TWO SUPPORT PEOPLE DURING VISITATION HOURS ONLY (7 AM -8PM)   The support person(s) must pass our screening, gel in and out, and wear a mask at all times, including in the patient's room. Patients must also wear a mask when staff or their support person are in the room. Visitors GUEST BADGE MUST BE WORN VISIBLY  One adult visitor may remain with you overnight and MUST be in the room by 8 P.M.  No visitors under the age of 53. Any visitor under the age of 50 must be accompanied by an adult.    COVID SWAB TESTING MUST BE COMPLETED ON: 02/11/21  **MUST PRESENT COMPLETED FORM AT TESTING SITE**    [place]  [place] (backside of the building) You are not required to quarantine, however you are required to wear a well-fitted mask when you are out and around people not in your household.  Hand Hygiene often Do NOT share personal items Notify your provider if you are in close contact with someone who has COVID or you develop fever 100.4 or greater, new onset of sneezing, cough, sore throat, shortness of breath or body aches.  [address], [address], must go inside of the hospital, NOT A DRIVE THRU!  (Must self quarantine after testing. Follow instructions on handout.)       Your procedure is scheduled on: 02/13/21   Report to [facility] Main Entrance    Report to admitting at: 11:15 AM   Call this number if you have problems the morning of surgery [phone]   May have liquids until : 10:30 AM   day of surgery  CLEAR LIQUID DIET: Starting the day before surgery.  Foods Allowed                                                                      Foods Excluded  Water, Black Coffee and tea, regular and decaf                             liquids that you cannot  Plain Jell-O in any flavor  (No red)                                           see through such as: Fruit ices (not with fruit pulp)                                     milk, soups, orange juice              Iced Popsicles (No red)  All solid food                                   Apple juices Sports drinks like Gatorade (No red) Lightly seasoned clear broth or consume(fat free) Sugar  Sample Menu Breakfast                                Lunch                                     Supper Cranberry juice                    Beef broth                            Chicken broth Jell-O                                     Grape juice                           Apple juice Coffee or tea                        Jell-O                                      Popsicle                                                Coffee or tea                        Coffee or tea     DRINK 2 PRESURGERY ENSURE DRINKS THE NIGHT BEFORE SURGERY AT  1000 PM AND 1 PRESURGERY DRINK THE DAY OF THE PROCEDURE 3 HOURS PRIOR TO SCHEDULED SURGERY. NO SOLIDS AFTER MIDNIGHT THE DAY PRIOR TO THE SURGERY. NOTHING BY MOUTH EXCEPT CLEAR LIQUIDS UNTIL THREE HOURS PRIOR TO SCHEDULED SURGERY. PLEASE FINISH PRESURGERY ENSURE DRINK PER SURGEON ORDER 3 HOURS PRIOR TO SCHEDULED SURGERY TIME WHICH NEEDS TO BE COMPLETED AT: 10:30 AM.     The day of surgery:  Drink ONE (1) Pre-Surgery Clear Ensure or G2 by am the morning of surgery. Drink in one sitting. Do not sip.  This drink was given to you during your hospital  pre-op appointment visit. Nothing else to drink after completing the  Pre-Surgery Clear Ensure or G2.          If you have questions, please contact your surgeon's office.     Oral Hygiene is also important to reduce your risk of infection.                                    Remember -  BRUSH YOUR TEETH THE MORNING OF SURGERY WITH  YOUR REGULAR TOOTHPASTE   Do NOT smoke after Midnight   Take these medicines the morning of surgery with A SIP OF WATER:   DO NOT TAKE ANY ORAL DIABETIC MEDICATIONS DAY OF YOUR SURGERY                              You may not have any metal on your body including hair pins, jewelry, and body piercing             Do not wear make-up, lotions, powders, perfumes/cologne, or deodorant  Do not wear nail polish including gel and S&S, artificial/acrylic nails, or any other type of covering on natural nails including finger and toenails. If you have artificial nails, gel coating, etc. that needs to be removed by a nail salon please have this removed prior to surgery or surgery may need to be canceled/ delayed if the surgeon/ anesthesia feels like they are unable to be safely monitored.   Do not shave  48 hours prior to surgery.               Men may shave face and neck.   Do not bring valuables to the hospital. [place].   Contacts, dentures or bridgework may not be worn into surgery.   Bring small overnight bag day of surgery.    Patients discharged on the day of surgery will not be allowed to drive home.   Special Instructions: Bring a copy of your healthcare power of attorney and living will documents         the day of surgery if you haven't scanned them before.              Please read over the following fact sheets you were given: IF YOU HAVE QUESTIONS ABOUT YOUR PRE-OP INSTRUCTIONS PLEASE CALL [phone]     [facility] - Preparing for Surgery Before surgery, you can play an important role.  Because skin is not sterile, your skin needs to be as free of germs as possible.  You can reduce the number of germs on your skin by washing with CHG (chlorahexidine gluconate) soap before surgery.  CHG is an antiseptic cleaner which kills germs and bonds with the skin to continue killing germs even  after washing. Please DO NOT use if you have an allergy to CHG or antibacterial soaps.  If your skin becomes reddened/irritated stop using the CHG and inform your nurse when you arrive at Short Stay. Do not shave (including legs and underarms) for at least 48 hours prior to the first CHG shower.  You may shave your face/neck. Please follow these instructions carefully:  1.  Shower with CHG Soap the night before surgery and the  morning of Surgery.  2.  If you choose to wash your hair, wash your hair first as usual with your  normal  shampoo.  3.  After you shampoo, rinse your hair and body thoroughly to remove the  shampoo.                           4.  Use CHG as you would any other liquid soap.  You can apply chg directly  to the skin and wash  Gently with a scrungie or clean washcloth.  5.  Apply the CHG Soap to your body ONLY FROM THE NECK DOWN.   Do not use on face/ open                           Wound or open sores. Avoid contact with eyes, ears mouth and genitals (private parts).                       Wash face,  Genitals (private parts) with your normal soap.             6.  Wash thoroughly, paying special attention to the area where your surgery  will be performed.  7.  Thoroughly rinse your body with warm water from the neck down.  8.  DO NOT shower/wash with your normal soap after using and rinsing off  the CHG Soap.                9.  Pat yourself dry with a clean towel.            10.  Wear clean pajamas.            11.  Place clean sheets on your bed the night of your first shower and do not  sleep with pets. Day of Surgery : Do not apply any lotions/deodorants the morning of surgery.  Please wear clean clothes to the hospital/surgery center.  FAILURE TO FOLLOW THESE INSTRUCTIONS MAY RESULT IN THE CANCELLATION OF YOUR SURGERY PATIENT SIGNATURE_________________________________  NURSE  SIGNATURE__________________________________  ________________________________________________________________________   Adam Phenix  An incentive spirometer is a tool that can help keep your lungs clear and active. This tool measures how well you are filling your lungs with each breath. Taking long deep breaths may help reverse or decrease the chance of developing breathing (pulmonary) problems (especially infection) following: A long period of time when you are unable to move or be active. BEFORE THE PROCEDURE  If the spirometer includes an indicator to show your best effort, your nurse or respiratory therapist will set it to a desired goal. If possible, sit up straight or lean slightly forward. Try not to slouch. Hold the incentive spirometer in an upright position. INSTRUCTIONS FOR USE  Sit on the edge of your bed if possible, or sit up as far as you can in bed or on a chair. Hold the incentive spirometer in an upright position. Breathe out normally. Place the mouthpiece in your mouth and seal your lips tightly around it. Breathe in slowly and as deeply as possible, raising the piston or the ball toward the top of the column. Hold your breath for 3-5 seconds or for as long as possible. Allow the piston or ball to fall to the bottom of the column. Remove the mouthpiece from your mouth and breathe out normally. Rest for a few seconds and repeat Steps 1 through 7 at least 10 times every 1-2 hours when you are awake. Take your time and take a few normal breaths between deep breaths. The spirometer may include an indicator to show your best effort. Use the indicator as a goal to work toward during each repetition. After each set of 10 deep breaths, practice coughing to be sure your lungs are clear. If you have an incision (the cut made at the time of surgery), support your incision when coughing by placing a pillow or rolled up towels firmly  against it. Once you are able to get out of  bed, walk around indoors and cough well. You may stop using the incentive spirometer when instructed by your caregiver.  RISKS AND COMPLICATIONS Take your time so you do not get dizzy or light-headed. If you are in pain, you may need to take or ask for pain medication before doing incentive spirometry. It is harder to take a deep breath if you are having pain. AFTER USE Rest and breathe slowly and easily. It can be helpful to keep track of a log of your progress. Your caregiver can provide you with a simple table to help with this. If you are using the spirometer at home, follow these instructions: [place] IF:  You are having difficultly using the spirometer. You have trouble using the spirometer as often as instructed. Your pain medication is not giving enough relief while using the spirometer. You develop fever of 100.5 F (38.1 C) or higher. SEEK IMMEDIATE MEDICAL CARE IF:  You cough up bloody sputum that had not been present before. You develop fever of 102 F (38.9 C) or greater. You develop worsening pain at or near the incision site. MAKE SURE YOU:  Understand these instructions. Will watch your condition. Will get help right away if you are not doing well or get worse. Document Released: 06/23/2006 Document Revised: 05/05/2011 Document Reviewed: 08/24/2006 [facility] Patient Information 2014 [place], [place].   ________________________________________________________________________

## 2021-02-04 ENCOUNTER — Other Ambulatory Visit: Payer: Self-pay

## 2021-02-04 ENCOUNTER — Encounter (HOSPITAL_COMMUNITY): Payer: Self-pay

## 2021-02-04 ENCOUNTER — Encounter (HOSPITAL_COMMUNITY)
Admission: RE | Admit: 2021-02-04 | Discharge: 2021-02-04 | Disposition: A | Discharge status: Home / Self Care | Payer: Medicare Other | Source: Ambulatory Visit | Attending: Surgery | Admitting: Surgery

## 2021-02-04 DIAGNOSIS — Z01812 Encounter for preprocedural laboratory examination: Secondary | ICD-10-CM | POA: Insufficient documentation

## 2021-02-04 DIAGNOSIS — Z01818 Encounter for other preprocedural examination: Secondary | ICD-10-CM

## 2021-02-04 HISTORY — DX: Essential (primary) hypertension: I10

## 2021-02-04 LAB — COMPREHENSIVE METABOLIC PANEL
ALT: 18 U/L (ref 0–44)
AST: 28 U/L (ref 15–41)
Albumin: 3.8 g/dL (ref 3.5–5.0)
Alkaline Phosphatase: 105 U/L (ref 38–126)
Anion gap: 10 (ref 5–15)
BUN: 7 mg/dL — ABNORMAL LOW (ref 8–23)
CO2: 26 mmol/L (ref 22–32)
Calcium: 9.2 mg/dL (ref 8.9–10.3)
Chloride: 100 mmol/L (ref 98–111)
Creatinine, Ser: 0.62 mg/dL (ref 0.61–1.24)
GFR, Estimated: >60 mL/min (ref >60)
Glucose, Bld: 110 mg/dL — ABNORMAL HIGH (ref 70–99)
Potassium: 4.5 mmol/L (ref 3.5–5.1)
Sodium: 136 mmol/L (ref 135–145)
Total Bilirubin: 1.1 mg/dL (ref 0.3–1.2)
Total Protein: 8 g/dL (ref 6.5–8.1)

## 2021-02-04 LAB — CBC WITH DIFFERENTIAL/PLATELET
Abs Immature Granulocytes: 0.1 10*3/uL — ABNORMAL HIGH (ref 0.00–0.07)
Basophils Absolute: 0.1 10*3/uL (ref 0.0–0.1)
Basophils Relative: 1 %
Eosinophils Absolute: 0.5 10*3/uL (ref 0.0–0.5)
Eosinophils Relative: 5 %
HCT: 47.8 % (ref 39.0–52.0)
Hemoglobin: 15.8 g/dL (ref 13.0–17.0)
Immature Granulocytes: 1 %
Lymphocytes Relative: 13 %
Lymphs Abs: 1.3 10*3/uL (ref 0.7–4.0)
MCH: 32.2 pg (ref 26.0–34.0)
MCHC: 33.1 g/dL (ref 30.0–36.0)
MCV: 97.4 fL (ref 80.0–100.0)
Monocytes Absolute: 1.3 10*3/uL — ABNORMAL HIGH (ref 0.1–1.0)
Monocytes Relative: 13 %
Neutro Abs: 7 10*3/uL (ref 1.7–7.7)
Neutrophils Relative %: 67 %
Platelets: 430 10*3/uL — ABNORMAL HIGH (ref 150–400)
RBC: 4.91 MIL/uL (ref 4.22–5.81)
RDW: 15.8 % — ABNORMAL HIGH (ref 11.5–15.5)
WBC: 10.3 10*3/uL (ref 4.0–10.5)
nRBC: 0 % (ref 0.0–0.2)

## 2021-02-04 LAB — HEMOGLOBIN A1C
Hgb A1c MFr Bld: 5.2 % (ref 4.8–5.6)
Mean Plasma Glucose: 102.54 mg/dL

## 2021-02-04 NOTE — Progress Notes (Signed)
COVID Vaccine Completed: Yes Date COVID Vaccine completed: 2021 x 1 COVID vaccine manufacturer:   [employer]  COVID Test: 02/11/21 PCP - Dr. Arlina Robes [place] Cardiologist -   Chest x-ray - 07/02/20 EKG - 05/25/20 Stress Test -  ECHO - 05/27/20 Cardiac Cath -  Pacemaker/ICD device last checked:  Sleep Study -  CPAP -   Fasting Blood Sugar -  Checks Blood Sugar _____ times a day  Blood Thinner Instructions: Aspirin Instructions: Last Dose:  Anesthesia review: Hx: COPD,HTN  Patient denies shortness of breath, fever, cough and chest pain at PAT appointment   Patient verbalized understanding of instructions that were given to them at the PAT appointment. Patient was also instructed that they will need to review over the PAT instructions again at home before surgery.

## 2021-02-05 ENCOUNTER — Ambulatory Visit (HOSPITAL_COMMUNITY)
Admission: RE | Admit: 2021-02-05 | Discharge: 2021-02-05 | Disposition: A | Discharge status: Home / Self Care | Payer: Medicare Other | Source: Ambulatory Visit | Attending: Family Medicine | Admitting: Family Medicine

## 2021-02-05 DIAGNOSIS — Z933 Colostomy status: Secondary | ICD-10-CM | POA: Diagnosis present

## 2021-02-05 DIAGNOSIS — K9400 Colostomy complication, unspecified: Secondary | ICD-10-CM | POA: Diagnosis not present

## 2021-02-05 DIAGNOSIS — K435 Parastomal hernia without obstruction or  gangrene: Secondary | ICD-10-CM | POA: Insufficient documentation

## 2021-02-05 DIAGNOSIS — L24B1 Irritant contact dermatitis related to digestive stoma or fistula: Secondary | ICD-10-CM

## 2021-02-05 MED ORDER — SILVER NITRATE-POT NITRATE 75-25 % EX MISC
1.0000 "application " | CUTANEOUS | Status: DC | PRN
  Filled 2021-02-05: qty 1

## 2021-02-05 NOTE — Progress Notes (Signed)
[facility]   Reason for visit:  LLQ colostomy with parastomal hernia.  Is anticipating reversal surgery on 02/13/21.  Ongoing leaks and skin irritation.   HPI:  Carcinoma with LLQ colostomy   ROS  Review of Systems  Respiratory:  Positive for cough.   Gastrointestinal:        LLQ colostomy with parastomal hernia  Skin:        Peristomal breakdown, full thickness  Psychiatric/Behavioral:  The patient is not nervous/anxious.        Anticipating surgery next week  All other systems reviewed and are negative. Vital signs:  BP (!) 163/85    Pulse (!) 114    Temp 98.3 F (36.8 C) (Oral)    Ht 5\' 8"  (1.727 m)    Wt 64.4 kg    SpO2 97%    BMI 21.59 kg/m  Exam:  Physical Exam Constitutional:      Appearance: He is normal weight.  Pulmonary:     Comments: Cough, chronic Abdominal:     Hernia: A hernia is present.  Neurological:     Mental Status: He is alert.    Stoma type/location:  LLQ flush stoma, stomal separation and parastomal hernia. Denuded skin from 3 to 9 o'clock due to frequent leaks Stomal assessment/size:  flush, oval shaped pink and moist producing soft brown stool.  Peristomal assessment:  \denuded skin, parastomal hernia that changes shape and size  Photo loaded in media tab.  Treatment options for stomal/peristomal skin: 2 piece flat pouch with barrier ring, stoma powder and skin prep.  Aquacel Ag to open wounds.  Barrier strips along perimeter of pouch.  States abdominal binder was too hot and is back to wearing an ostomy belt. This is not working as well but its all he can tolerate.  Output: soft brown stool with frequent leaks Ostomy pouching: 2pc. 2 3/4" with barrier ring, powder prep and belt.   Education provided:  Added Aquacel Ag to open wounds.  Covered with barrier ring.  Pouch warmed after application.     Impression/dx  Peristomal irritant contact dermatitis. Parastomal hernia Discussion  Topical skin care Plan  Reversal next week.   Discussed his bowel prep and he understands the instructions.     Visit time: 50 minutes.   Domenic Moras FNP-BC

## 2021-02-11 ENCOUNTER — Other Ambulatory Visit: Payer: Self-pay | Admitting: Surgery

## 2021-02-12 LAB — SARS CORONAVIRUS 2 (TAT 6-24 HRS): SARS Coronavirus 2: NEGATIVE

## 2021-02-13 ENCOUNTER — Inpatient Hospital Stay (HOSPITAL_COMMUNITY): Payer: Medicare Other | Admitting: Anesthesiology

## 2021-02-13 ENCOUNTER — Encounter (HOSPITAL_COMMUNITY)
Admission: RE | Disposition: A | Discharge status: Home / Self Care | Payer: Self-pay | Source: Ambulatory Visit | Attending: Surgery

## 2021-02-13 ENCOUNTER — Encounter (HOSPITAL_COMMUNITY): Payer: Self-pay | Admitting: Surgery

## 2021-02-13 ENCOUNTER — Other Ambulatory Visit: Payer: Self-pay

## 2021-02-13 ENCOUNTER — Inpatient Hospital Stay (HOSPITAL_COMMUNITY)
Admission: RE | Admit: 2021-02-13 | Discharge: 2021-02-17 | DRG: 346 | Disposition: A | Discharge status: Home / Self Care | Payer: Medicare Other | Source: Ambulatory Visit | Attending: Surgery | Admitting: Surgery

## 2021-02-13 DIAGNOSIS — Z87891 Personal history of nicotine dependence: Secondary | ICD-10-CM | POA: Diagnosis not present

## 2021-02-13 DIAGNOSIS — I700 Atherosclerosis of aorta: Secondary | ICD-10-CM | POA: Diagnosis present

## 2021-02-13 DIAGNOSIS — Z433 Encounter for attention to colostomy: Secondary | ICD-10-CM | POA: Diagnosis present

## 2021-02-13 DIAGNOSIS — Z9889 Other specified postprocedural states: Secondary | ICD-10-CM

## 2021-02-13 DIAGNOSIS — D509 Iron deficiency anemia, unspecified: Secondary | ICD-10-CM | POA: Diagnosis present

## 2021-02-13 DIAGNOSIS — I10 Essential (primary) hypertension: Secondary | ICD-10-CM | POA: Diagnosis present

## 2021-02-13 DIAGNOSIS — Z85038 Personal history of other malignant neoplasm of large intestine: Secondary | ICD-10-CM | POA: Diagnosis not present

## 2021-02-13 DIAGNOSIS — Z8711 Personal history of peptic ulcer disease: Secondary | ICD-10-CM

## 2021-02-13 DIAGNOSIS — K435 Parastomal hernia without obstruction or  gangrene: Secondary | ICD-10-CM | POA: Diagnosis present

## 2021-02-13 DIAGNOSIS — G4700 Insomnia, unspecified: Secondary | ICD-10-CM | POA: Diagnosis present

## 2021-02-13 DIAGNOSIS — R911 Solitary pulmonary nodule: Secondary | ICD-10-CM | POA: Diagnosis present

## 2021-02-13 DIAGNOSIS — J449 Chronic obstructive pulmonary disease, unspecified: Secondary | ICD-10-CM | POA: Diagnosis present

## 2021-02-13 HISTORY — PX: FLEXIBLE SIGMOIDOSCOPY: SHX5431

## 2021-02-13 HISTORY — PX: PARASTOMAL HERNIA REPAIR: SHX2162

## 2021-02-13 HISTORY — PX: XI ROBOTIC ASSISTED COLOSTOMY TAKEDOWN: SHX6828

## 2021-02-13 LAB — TYPE AND SCREEN
ABO/RH(D): B POS
Antibody Screen: NEGATIVE

## 2021-02-13 SURGERY — CLOSURE, COLOSTOMY, ROBOT-ASSISTED
Anesthesia: General | Site: Rectum

## 2021-02-13 MED ORDER — FENTANYL CITRATE PF 50 MCG/ML IJ SOSY
PREFILLED_SYRINGE | INTRAMUSCULAR | Status: AC
  Administered 2021-02-13: 16:00:00 50 ug via INTRAVENOUS
  Filled 2021-02-13: qty 3

## 2021-02-13 MED ORDER — INDOCYANINE GREEN 25 MG IV SOLR
INTRAVENOUS | Status: DC | PRN
  Administered 2021-02-13: 2.5 mg via INTRAVENOUS

## 2021-02-13 MED ORDER — LACTATED RINGERS IV SOLN: INTRAVENOUS | Status: DC

## 2021-02-13 MED ORDER — ENSURE PRE-SURGERY PO LIQD
592.0000 mL | Freq: Once | ORAL | Status: DC
  Filled 2021-02-13: qty 592

## 2021-02-13 MED ORDER — BUPIVACAINE-EPINEPHRINE (PF) 0.25% -1:200000 IJ SOLN
INTRAMUSCULAR | Status: AC
  Filled 2021-02-13: qty 30

## 2021-02-13 MED ORDER — ORAL CARE MOUTH RINSE: 15.0000 mL | Freq: Once | OROMUCOSAL | Status: AC

## 2021-02-13 MED ORDER — BISACODYL 5 MG PO TBEC: 20.0000 mg | DELAYED_RELEASE_TABLET | Freq: Once | ORAL | Status: DC

## 2021-02-13 MED ORDER — HYDRALAZINE HCL 20 MG/ML IJ SOLN: 10.0000 mg | INTRAMUSCULAR | Status: DC | PRN

## 2021-02-13 MED ORDER — MIDAZOLAM HCL 2 MG/2ML IJ SOLN
INTRAMUSCULAR | Status: AC
  Filled 2021-02-13: qty 2

## 2021-02-13 MED ORDER — NEOMYCIN SULFATE 500 MG PO TABS: 1000.0000 mg | ORAL_TABLET | ORAL | Status: DC

## 2021-02-13 MED ORDER — SODIUM CHLORIDE (PF) 0.9 % IJ SOLN
INTRAMUSCULAR | Status: AC
  Filled 2021-02-13: qty 10

## 2021-02-13 MED ORDER — ALVIMOPAN 12 MG PO CAPS
12.0000 mg | ORAL_CAPSULE | ORAL | Status: AC
  Administered 2021-02-13: 12:00:00 12 mg via ORAL
  Filled 2021-02-13: qty 1

## 2021-02-13 MED ORDER — METRONIDAZOLE 500 MG PO TABS: 1000.0000 mg | ORAL_TABLET | ORAL | Status: DC

## 2021-02-13 MED ORDER — ONDANSETRON HCL 4 MG/2ML IJ SOLN: 4.0000 mg | Freq: Once | INTRAMUSCULAR | Status: DC | PRN

## 2021-02-13 MED ORDER — DEXAMETHASONE SODIUM PHOSPHATE 10 MG/ML IJ SOLN
INTRAMUSCULAR | Status: AC
  Filled 2021-02-13: qty 1

## 2021-02-13 MED ORDER — ALVIMOPAN 12 MG PO CAPS
12.0000 mg | ORAL_CAPSULE | Freq: Two times a day (BID) | ORAL | Status: DC
  Administered 2021-02-14 – 2021-02-15 (×4): 12 mg via ORAL
  Filled 2021-02-13 (×4): qty 1

## 2021-02-13 MED ORDER — AMLODIPINE BESYLATE 5 MG PO TABS
5.0000 mg | ORAL_TABLET | Freq: Every day | ORAL | Status: DC
  Administered 2021-02-14 – 2021-02-17 (×4): 5 mg via ORAL
  Filled 2021-02-13 (×4): qty 1

## 2021-02-13 MED ORDER — LIDOCAINE 2% (20 MG/ML) 5 ML SYRINGE
INTRAMUSCULAR | Status: AC
  Filled 2021-02-13: qty 5

## 2021-02-13 MED ORDER — IBUPROFEN 400 MG PO TABS
600.0000 mg | ORAL_TABLET | Freq: Four times a day (QID) | ORAL | Status: DC | PRN
  Administered 2021-02-13 – 2021-02-14 (×2): 600 mg via ORAL
  Filled 2021-02-13 (×2): qty 1

## 2021-02-13 MED ORDER — LIDOCAINE 20MG/ML (2%) 15 ML SYRINGE OPTIME
INTRAMUSCULAR | Status: DC | PRN
  Administered 2021-02-13: 1.5 mg/kg/h via INTRAVENOUS

## 2021-02-13 MED ORDER — ONDANSETRON HCL 4 MG/2ML IJ SOLN
INTRAMUSCULAR | Status: AC
  Filled 2021-02-13: qty 2

## 2021-02-13 MED ORDER — FENTANYL CITRATE (PF) 250 MCG/5ML IJ SOLN
INTRAMUSCULAR | Status: AC
  Filled 2021-02-13: qty 5

## 2021-02-13 MED ORDER — ONDANSETRON HCL 4 MG PO TABS: 4.0000 mg | ORAL_TABLET | Freq: Four times a day (QID) | ORAL | Status: DC | PRN

## 2021-02-13 MED ORDER — HYDROMORPHONE HCL 1 MG/ML IJ SOLN
0.5000 mg | INTRAMUSCULAR | Status: DC | PRN
  Administered 2021-02-13 – 2021-02-16 (×8): 0.5 mg via INTRAVENOUS
  Filled 2021-02-13 (×8): qty 0.5

## 2021-02-13 MED ORDER — CHLORHEXIDINE GLUCONATE CLOTH 2 % EX PADS: 6.0000 | MEDICATED_PAD | Freq: Once | CUTANEOUS | Status: DC

## 2021-02-13 MED ORDER — OXYCODONE HCL 5 MG PO TABS: 5.0000 mg | ORAL_TABLET | Freq: Once | ORAL | Status: DC | PRN

## 2021-02-13 MED ORDER — MIDAZOLAM HCL 2 MG/2ML IJ SOLN
INTRAMUSCULAR | Status: DC | PRN
Start: 2021-02-13 — End: 2021-02-13
  Administered 2021-02-13: 2 mg via INTRAVENOUS

## 2021-02-13 MED ORDER — ROCURONIUM BROMIDE 10 MG/ML (PF) SYRINGE
PREFILLED_SYRINGE | INTRAVENOUS | Status: DC | PRN
  Administered 2021-02-13: 100 mg via INTRAVENOUS

## 2021-02-13 MED ORDER — FENTANYL CITRATE (PF) 250 MCG/5ML IJ SOLN
INTRAMUSCULAR | Status: DC | PRN
  Administered 2021-02-13 (×2): 100 ug via INTRAVENOUS
  Administered 2021-02-13: 50 ug via INTRAVENOUS

## 2021-02-13 MED ORDER — SUGAMMADEX SODIUM 200 MG/2ML IV SOLN
INTRAVENOUS | Status: DC | PRN
  Administered 2021-02-13: 100 mg via INTRAVENOUS
  Administered 2021-02-13: 200 mg via INTRAVENOUS

## 2021-02-13 MED ORDER — OXYCODONE HCL 5 MG/5ML PO SOLN: 5.0000 mg | Freq: Once | ORAL | Status: DC | PRN

## 2021-02-13 MED ORDER — 0.9 % SODIUM CHLORIDE (POUR BTL) OPTIME
TOPICAL | Status: DC | PRN
  Administered 2021-02-13: 13:00:00 2000 mL

## 2021-02-13 MED ORDER — MELATONIN 3 MG PO TABS: 3.0000 mg | ORAL_TABLET | Freq: Every evening | ORAL | Status: DC | PRN

## 2021-02-13 MED ORDER — INDOCYANINE GREEN 25 MG IV SOLR
INTRAVENOUS | Status: AC
  Filled 2021-02-13: qty 10

## 2021-02-13 MED ORDER — PROPOFOL 10 MG/ML IV BOLUS
INTRAVENOUS | Status: AC
  Filled 2021-02-13: qty 20

## 2021-02-13 MED ORDER — BUPIVACAINE LIPOSOME 1.3 % IJ SUSP
INTRAMUSCULAR | Status: DC | PRN
  Administered 2021-02-13: 20 mL

## 2021-02-13 MED ORDER — ACETAMINOPHEN 160 MG/5ML PO SOLN: 325.0000 mg | ORAL | Status: DC | PRN

## 2021-02-13 MED ORDER — DIPHENHYDRAMINE HCL 50 MG/ML IJ SOLN: 12.5000 mg | Freq: Four times a day (QID) | INTRAMUSCULAR | Status: DC | PRN

## 2021-02-13 MED ORDER — DEXAMETHASONE SODIUM PHOSPHATE 10 MG/ML IJ SOLN
INTRAMUSCULAR | Status: DC | PRN
  Administered 2021-02-13: 5 mg via INTRAVENOUS

## 2021-02-13 MED ORDER — ENSURE PRE-SURGERY PO LIQD
296.0000 mL | Freq: Once | ORAL | Status: DC
  Filled 2021-02-13: qty 296

## 2021-02-13 MED ORDER — CHLORHEXIDINE GLUCONATE 0.12 % MT SOLN
15.0000 mL | Freq: Once | OROMUCOSAL | Status: AC
  Administered 2021-02-13: 12:00:00 15 mL via OROMUCOSAL

## 2021-02-13 MED ORDER — ALUM & MAG HYDROXIDE-SIMETH 200-200-20 MG/5ML PO SUSP: 30.0000 mL | Freq: Four times a day (QID) | ORAL | Status: DC | PRN

## 2021-02-13 MED ORDER — ACETAMINOPHEN 500 MG PO TABS
1000.0000 mg | ORAL_TABLET | Freq: Four times a day (QID) | ORAL | Status: DC
  Administered 2021-02-13 – 2021-02-17 (×16): 1000 mg via ORAL
  Filled 2021-02-13 (×17): qty 2

## 2021-02-13 MED ORDER — ONDANSETRON HCL 4 MG/2ML IJ SOLN
INTRAMUSCULAR | Status: DC | PRN
  Administered 2021-02-13: 4 mg via INTRAVENOUS

## 2021-02-13 MED ORDER — PANTOPRAZOLE SODIUM 40 MG PO TBEC
40.0000 mg | DELAYED_RELEASE_TABLET | Freq: Every day | ORAL | Status: DC
  Administered 2021-02-14 – 2021-02-17 (×4): 40 mg via ORAL
  Filled 2021-02-13 (×4): qty 1

## 2021-02-13 MED ORDER — HEPARIN SODIUM (PORCINE) 5000 UNIT/ML IJ SOLN
5000.0000 [IU] | Freq: Three times a day (TID) | INTRAMUSCULAR | Status: DC
  Administered 2021-02-13 – 2021-02-17 (×11): 5000 [IU] via SUBCUTANEOUS
  Filled 2021-02-13 (×11): qty 1

## 2021-02-13 MED ORDER — PHENYLEPHRINE 40 MCG/ML (10ML) SYRINGE FOR IV PUSH (FOR BLOOD PRESSURE SUPPORT)
PREFILLED_SYRINGE | INTRAVENOUS | Status: DC | PRN
Start: 2021-02-13 — End: 2021-02-13
  Administered 2021-02-13: 120 ug via INTRAVENOUS

## 2021-02-13 MED ORDER — TRAZODONE HCL 50 MG PO TABS: 50.0000 mg | ORAL_TABLET | Freq: Every evening | ORAL | Status: DC | PRN

## 2021-02-13 MED ORDER — PHENYLEPHRINE 40 MCG/ML (10ML) SYRINGE FOR IV PUSH (FOR BLOOD PRESSURE SUPPORT)
PREFILLED_SYRINGE | INTRAVENOUS | Status: AC
  Filled 2021-02-13: qty 10

## 2021-02-13 MED ORDER — SODIUM CHLORIDE 0.9 % IV SOLN
2.0000 g | INTRAVENOUS | Status: AC
  Administered 2021-02-13: 13:00:00 2 g via INTRAVENOUS
  Filled 2021-02-13: qty 2

## 2021-02-13 MED ORDER — FENTANYL CITRATE PF 50 MCG/ML IJ SOSY
25.0000 ug | PREFILLED_SYRINGE | INTRAMUSCULAR | Status: DC | PRN
  Administered 2021-02-13: 15:00:00 25 ug via INTRAVENOUS
  Administered 2021-02-13: 16:00:00 50 ug via INTRAVENOUS

## 2021-02-13 MED ORDER — LIDOCAINE 2% (20 MG/ML) 5 ML SYRINGE
INTRAMUSCULAR | Status: DC | PRN
  Administered 2021-02-13: 60 mg via INTRAVENOUS

## 2021-02-13 MED ORDER — ONDANSETRON HCL 4 MG/2ML IJ SOLN: 4.0000 mg | Freq: Four times a day (QID) | INTRAMUSCULAR | Status: DC | PRN

## 2021-02-13 MED ORDER — LACTATED RINGERS IR SOLN
Status: DC | PRN
  Administered 2021-02-13: 1000 mL

## 2021-02-13 MED ORDER — ENSURE SURGERY PO LIQD
237.0000 mL | Freq: Two times a day (BID) | ORAL | Status: DC
  Administered 2021-02-14 – 2021-02-17 (×7): 237 mL via ORAL
  Filled 2021-02-13 (×2): qty 237

## 2021-02-13 MED ORDER — BUPIVACAINE LIPOSOME 1.3 % IJ SUSP
INTRAMUSCULAR | Status: AC
  Filled 2021-02-13: qty 20

## 2021-02-13 MED ORDER — KETAMINE HCL 50 MG/5ML IJ SOSY
PREFILLED_SYRINGE | INTRAMUSCULAR | Status: AC
  Filled 2021-02-13: qty 5

## 2021-02-13 MED ORDER — DIPHENHYDRAMINE HCL 12.5 MG/5ML PO ELIX: 12.5000 mg | ORAL_SOLUTION | Freq: Four times a day (QID) | ORAL | Status: DC | PRN

## 2021-02-13 MED ORDER — PROPOFOL 10 MG/ML IV BOLUS
INTRAVENOUS | Status: DC | PRN
  Administered 2021-02-13: 160 mg via INTRAVENOUS

## 2021-02-13 MED ORDER — BUPIVACAINE LIPOSOME 1.3 % IJ SUSP: 20.0000 mL | Freq: Once | INTRAMUSCULAR | Status: DC

## 2021-02-13 MED ORDER — ACETAMINOPHEN 500 MG PO TABS
1000.0000 mg | ORAL_TABLET | ORAL | Status: AC
  Administered 2021-02-13: 12:00:00 1000 mg via ORAL
  Filled 2021-02-13: qty 2

## 2021-02-13 MED ORDER — HEPARIN SODIUM (PORCINE) 5000 UNIT/ML IJ SOLN
5000.0000 [IU] | Freq: Once | INTRAMUSCULAR | Status: AC
  Administered 2021-02-13: 12:00:00 5000 [IU] via SUBCUTANEOUS
  Filled 2021-02-13: qty 1

## 2021-02-13 MED ORDER — LIDOCAINE HCL 2 % IJ SOLN
INTRAMUSCULAR | Status: AC
  Filled 2021-02-13: qty 20

## 2021-02-13 MED ORDER — ACETAMINOPHEN 325 MG PO TABS: 325.0000 mg | ORAL_TABLET | ORAL | Status: DC | PRN

## 2021-02-13 MED ORDER — MEPERIDINE HCL 50 MG/ML IJ SOLN: 6.2500 mg | INTRAMUSCULAR | Status: DC | PRN

## 2021-02-13 MED ORDER — TRAMADOL HCL 50 MG PO TABS
50.0000 mg | ORAL_TABLET | Freq: Four times a day (QID) | ORAL | Status: DC | PRN
  Administered 2021-02-15: 09:00:00 50 mg via ORAL
  Filled 2021-02-13: qty 1

## 2021-02-13 MED ORDER — ROCURONIUM BROMIDE 10 MG/ML (PF) SYRINGE
PREFILLED_SYRINGE | INTRAVENOUS | Status: AC
  Filled 2021-02-13: qty 10

## 2021-02-13 MED ORDER — KETAMINE HCL 10 MG/ML IJ SOLN
INTRAMUSCULAR | Status: DC | PRN
  Administered 2021-02-13: 30 mg via INTRAVENOUS

## 2021-02-13 MED ORDER — SIMETHICONE 80 MG PO CHEW: 40.0000 mg | CHEWABLE_TABLET | Freq: Four times a day (QID) | ORAL | Status: DC | PRN

## 2021-02-13 MED ORDER — POLYETHYLENE GLYCOL 3350 17 GM/SCOOP PO POWD: 1.0000 | Freq: Once | ORAL | Status: DC

## 2021-02-13 MED ORDER — BUPIVACAINE-EPINEPHRINE (PF) 0.25% -1:200000 IJ SOLN
INTRAMUSCULAR | Status: DC | PRN
  Administered 2021-02-13: 30 mL

## 2021-02-13 SURGICAL SUPPLY — 126 items
ADH SKN CLS APL DERMABOND .7 (GAUZE/BANDAGES/DRESSINGS) ×2
APL PRP STRL LF DISP 70% ISPRP (MISCELLANEOUS) ×2
APPLIER CLIP 5 13 M/L LIGAMAX5 (MISCELLANEOUS)
APPLIER CLIP ROT 10 11.4 M/L (STAPLE)
APR CLP MED LRG 11.4X10 (STAPLE)
APR CLP MED LRG 5 ANG JAW (MISCELLANEOUS)
BAG COUNTER SPONGE SURGICOUNT (BAG) IMPLANT
BAG SPNG CNTER NS LX DISP (BAG)
BAG SURGICOUNT SPONGE COUNTING (BAG)
BINDER ABDOMINAL 12 ML 46-62 (SOFTGOODS) ×2 IMPLANT
BLADE EXTENDED COATED 6.5IN (ELECTRODE) ×5 IMPLANT
BNDG GAUZE ELAST 4 BULKY (GAUZE/BANDAGES/DRESSINGS) ×2 IMPLANT
CANNULA REDUC XI 12-8 STAPL (CANNULA) ×3
CANNULA REDUC XI 12-8MM STAPL (CANNULA) ×1
CANNULA REDUCER 12-8 DVNC XI (CANNULA) ×3 IMPLANT
CELLS DAT CNTRL 66122 CELL SVR (MISCELLANEOUS) IMPLANT
CHLORAPREP W/TINT 26 (MISCELLANEOUS) ×5 IMPLANT
CLIP APPLIE 5 13 M/L LIGAMAX5 (MISCELLANEOUS) IMPLANT
CLIP APPLIE ROT 10 11.4 M/L (STAPLE) IMPLANT
CLIP LIGATING HEM O LOK PURPLE (MISCELLANEOUS) IMPLANT
CLIP LIGATING HEMO O LOK GREEN (MISCELLANEOUS) IMPLANT
COVER SURGICAL LIGHT HANDLE (MISCELLANEOUS) ×10 IMPLANT
COVER TIP SHEARS 8 DVNC (MISCELLANEOUS) ×3 IMPLANT
COVER TIP SHEARS 8MM DA VINCI (MISCELLANEOUS) ×4
DECANTER SPIKE VIAL GLASS SM (MISCELLANEOUS) ×5 IMPLANT
DEFOGGER SCOPE WARMER CLEARIFY (MISCELLANEOUS) ×5 IMPLANT
DERMABOND ADVANCED (GAUZE/BANDAGES/DRESSINGS) ×2
DERMABOND ADVANCED .7 DNX12 (GAUZE/BANDAGES/DRESSINGS) IMPLANT
DEVICE TROCAR PUNCTURE CLOSURE (ENDOMECHANICALS) IMPLANT
DRAIN CHANNEL 19F RND (DRAIN) ×5 IMPLANT
DRAPE ARM DVNC X/XI (DISPOSABLE) ×12 IMPLANT
DRAPE COLUMN DVNC XI (DISPOSABLE) ×3 IMPLANT
DRAPE DA VINCI XI ARM (DISPOSABLE) ×16
DRAPE DA VINCI XI COLUMN (DISPOSABLE) ×4
DRAPE SURG IRRIG POUCH 19X23 (DRAPES) ×5 IMPLANT
DRSG OPSITE POSTOP 4X10 (GAUZE/BANDAGES/DRESSINGS) IMPLANT
DRSG OPSITE POSTOP 4X6 (GAUZE/BANDAGES/DRESSINGS) IMPLANT
DRSG OPSITE POSTOP 4X8 (GAUZE/BANDAGES/DRESSINGS) IMPLANT
DRSG TEGADERM 2-3/8X2-3/4 SM (GAUZE/BANDAGES/DRESSINGS) ×25 IMPLANT
DRSG TEGADERM 4X4.75 (GAUZE/BANDAGES/DRESSINGS) ×5 IMPLANT
ELECT REM PT RETURN 15FT ADLT (MISCELLANEOUS) ×5 IMPLANT
ENDOLOOP SUT PDS II  0 18 (SUTURE)
ENDOLOOP SUT PDS II 0 18 (SUTURE) IMPLANT
EVACUATOR SILICONE 100CC (DRAIN) ×5 IMPLANT
GAUZE SPONGE 2X2 8PLY STRL LF (GAUZE/BANDAGES/DRESSINGS) ×3 IMPLANT
GAUZE SPONGE 4X4 12PLY STRL (GAUZE/BANDAGES/DRESSINGS) ×2 IMPLANT
GLOVE SURG ENC MOIS LTX SZ7.5 (GLOVE) ×15 IMPLANT
GLOVE SURG UNDER LTX SZ8 (GLOVE) ×15 IMPLANT
GOWN STRL REUS W/TWL XL LVL3 (GOWN DISPOSABLE) ×25 IMPLANT
GRASPER SUT TROCAR 14GX15 (MISCELLANEOUS) IMPLANT
HOLDER FOLEY CATH W/STRAP (MISCELLANEOUS) ×5 IMPLANT
IRRIG SUCT STRYKERFLOW 2 WTIP (MISCELLANEOUS) ×4
IRRIGATION SUCT STRKRFLW 2 WTP (MISCELLANEOUS) ×3 IMPLANT
KIT PROCEDURE DA VINCI SI (MISCELLANEOUS) ×4
KIT PROCEDURE DVNC SI (MISCELLANEOUS) IMPLANT
KIT TURNOVER KIT A (KITS) IMPLANT
NDL INSUFFLATION 14GA 120MM (NEEDLE) ×3 IMPLANT
NEEDLE INSUFFLATION 14GA 120MM (NEEDLE) ×4 IMPLANT
PACK CARDIOVASCULAR III (CUSTOM PROCEDURE TRAY) ×5 IMPLANT
PACK COLON (CUSTOM PROCEDURE TRAY) ×5 IMPLANT
PAD POSITIONING PINK XL (MISCELLANEOUS) ×5 IMPLANT
PENCIL SMOKE EVACUATOR (MISCELLANEOUS) IMPLANT
PROTECTOR NERVE ULNAR (MISCELLANEOUS) ×10 IMPLANT
RELOAD STAPLE 45 3.5 BLU DVNC (STAPLE) IMPLANT
RELOAD STAPLE 45 4.3 GRN DVNC (STAPLE) IMPLANT
RELOAD STAPLE 60 3.5 BLU DVNC (STAPLE) IMPLANT
RELOAD STAPLE 60 4.3 GRN DVNC (STAPLE) IMPLANT
RELOAD STAPLER 3.5X45 BLU DVNC (STAPLE) IMPLANT
RELOAD STAPLER 3.5X60 BLU DVNC (STAPLE) IMPLANT
RELOAD STAPLER 4.3X45 GRN DVNC (STAPLE) IMPLANT
RELOAD STAPLER 4.3X60 GRN DVNC (STAPLE) IMPLANT
RETRACTOR WND ALEXIS 18 MED (MISCELLANEOUS) IMPLANT
RTRCTR WOUND ALEXIS 18CM MED (MISCELLANEOUS)
SCISSORS LAP 5X35 DISP (ENDOMECHANICALS) IMPLANT
SEAL CANN UNIV 5-8 DVNC XI (MISCELLANEOUS) ×12 IMPLANT
SEAL XI 5MM-8MM UNIVERSAL (MISCELLANEOUS) ×16
SEALER VESSEL DA VINCI XI (MISCELLANEOUS) ×4
SEALER VESSEL EXT DVNC XI (MISCELLANEOUS) ×3 IMPLANT
SLEEVE ADV FIXATION 5X100MM (TROCAR) IMPLANT
SOLUTION ELECTROLUBE (MISCELLANEOUS) ×5 IMPLANT
SPONGE GAUZE 2X2 STER 10/PKG (GAUZE/BANDAGES/DRESSINGS) ×2
STAPLER CANNULA SEAL DVNC XI (STAPLE) ×3 IMPLANT
STAPLER CANNULA SEAL XI (STAPLE) ×4
STAPLER ECHELON POWER CIR 29 (STAPLE) ×2 IMPLANT
STAPLER ECHELON POWER CIR 31 (STAPLE) IMPLANT
STAPLER RELOAD 3.5X45 BLU DVNC (STAPLE)
STAPLER RELOAD 3.5X45 BLUE (STAPLE)
STAPLER RELOAD 3.5X60 BLU DVNC (STAPLE)
STAPLER RELOAD 3.5X60 BLUE (STAPLE)
STAPLER RELOAD 4.3X45 GREEN (STAPLE)
STAPLER RELOAD 4.3X45 GRN DVNC (STAPLE)
STAPLER RELOAD 4.3X60 GREEN (STAPLE)
STAPLER RELOAD 4.3X60 GRN DVNC (STAPLE)
STOPCOCK 4 WAY LG BORE MALE ST (IV SETS) ×10 IMPLANT
SURGILUBE 2OZ TUBE FLIPTOP (MISCELLANEOUS) ×5 IMPLANT
SUT MNCRL AB 4-0 PS2 18 (SUTURE) ×5 IMPLANT
SUT PDS AB 1 CT1 27 (SUTURE) IMPLANT
SUT PDS AB 1 TP1 96 (SUTURE) IMPLANT
SUT PROLENE 0 CT 2 (SUTURE) IMPLANT
SUT PROLENE 2 0 KS (SUTURE) ×5 IMPLANT
SUT PROLENE 2 0 SH DA (SUTURE) IMPLANT
SUT SILK 2 0 (SUTURE) ×4
SUT SILK 2 0 SH CR/8 (SUTURE) IMPLANT
SUT SILK 2-0 18XBRD TIE 12 (SUTURE) IMPLANT
SUT SILK 3 0 (SUTURE) ×4
SUT SILK 3 0 SH CR/8 (SUTURE) ×5 IMPLANT
SUT SILK 3-0 18XBRD TIE 12 (SUTURE) ×3 IMPLANT
SUT V-LOC BARB 180 2/0GR6 GS22 (SUTURE)
SUT VIC AB 3-0 SH 18 (SUTURE) IMPLANT
SUT VIC AB 3-0 SH 27 (SUTURE)
SUT VIC AB 3-0 SH 27XBRD (SUTURE) IMPLANT
SUT VICRYL 0 UR6 27IN ABS (SUTURE) ×5 IMPLANT
SUTURE V-LC BRB 180 2/0GR6GS22 (SUTURE) IMPLANT
SYR 10ML LL (SYRINGE) ×5 IMPLANT
SYS LAPSCP GELPORT 120MM (MISCELLANEOUS)
SYS WOUND ALEXIS 18CM MED (MISCELLANEOUS) ×4
SYSTEM LAPSCP GELPORT 120MM (MISCELLANEOUS) IMPLANT
SYSTEM WOUND ALEXIS 18CM MED (MISCELLANEOUS) ×3 IMPLANT
TAPE CLOTH SURG 4X10 WHT LF (GAUZE/BANDAGES/DRESSINGS) ×2 IMPLANT
TAPE UMBILICAL 1/8 X36 TWILL (MISCELLANEOUS) ×5 IMPLANT
TOWEL OR NON WOVEN STRL DISP B (DISPOSABLE) ×5 IMPLANT
TRAY FOLEY MTR SLVR 16FR STAT (SET/KITS/TRAYS/PACK) ×5 IMPLANT
TROCAR ADV FIXATION 5X100MM (TROCAR) ×5 IMPLANT
TUBING CONNECTING 10 (TUBING) ×8 IMPLANT
TUBING CONNECTING 10' (TUBING) ×2
TUBING INSUFFLATION 10FT LAP (TUBING) ×5 IMPLANT

## 2021-02-13 NOTE — Anesthesia Postprocedure Evaluation (Signed)
Anesthesia Post Note  Patient: Marc Mcdonald  Procedure(s) Performed: XI ROBOTIC ASSISTED COLOSTOMY TAKEDOWN, TAP BLOCK (Abdomen) FLEXIBLE SIGMOIDOSCOPY (Rectum) PRIMARY REPAIR CLOSURE OF PARASTOMAL HERNIA, INTRAOPERATIVE ASSESSMENT OF PERFUSION (Abdomen)     Patient location during evaluation: PACU Anesthesia Type: General Level of consciousness: awake and alert Pain management: pain level controlled Vital Signs Assessment: post-procedure vital signs reviewed and stable Respiratory status: spontaneous breathing, nonlabored ventilation, respiratory function stable and patient connected to nasal cannula oxygen Cardiovascular status: blood pressure returned to baseline and stable Postop Assessment: no apparent nausea or vomiting Anesthetic complications: no   No notable events documented.  Last Vitals:  Vitals:   02/13/21 1530 02/13/21 1533  BP: 136/87   Pulse: 92 93  Resp: 18 17  Temp:    SpO2: 99% 98%    Last Pain:  Vitals:   02/13/21 1533  TempSrc:   PainSc: 9                  Zakaria Sedor

## 2021-02-13 NOTE — Anesthesia Procedure Notes (Signed)
Procedure Name: Intubation Date/Time: 02/13/2021 12:46 PM Performed by: Lollie Sails, CRNA Pre-anesthesia Checklist: Patient identified, Emergency Drugs available, Suction available, Patient being monitored and Timeout performed Patient Re-evaluated:Patient Re-evaluated prior to induction Oxygen Delivery Method: Circle system utilized Preoxygenation: Pre-oxygenation with 100% oxygen Induction Type: IV induction Ventilation: Mask ventilation without difficulty Laryngoscope Size: Miller and 3 Grade View: Grade I Tube type: Oral Tube size: 8.0 mm Number of attempts: 1 Airway Equipment and Method: Stylet Placement Confirmation: ETT inserted through vocal cords under direct vision, positive ETCO2 and breath sounds checked- equal and bilateral Secured at: 23 cm Tube secured with: Tape Dental Injury: Teeth and Oropharynx as per pre-operative assessment

## 2021-02-13 NOTE — Anesthesia Preprocedure Evaluation (Signed)
Anesthesia Evaluation  Patient identified by MRN, date of birth, ID band Patient awake    Reviewed: Allergy & Precautions, H&P , NPO status , Patient's Chart, lab work & pertinent test results  Airway Mallampati: II   Neck ROM: full    Dental  (+) Poor Dentition, Chipped, Missing, Dental Advisory Given,    Pulmonary COPD, Patient abstained from smoking., former smoker,    breath sounds clear to auscultation       Cardiovascular hypertension, Pt. on medications negative cardio ROS   Rhythm:regular Rate:Normal  ECHO 4/22 1. Left ventricular ejection fraction, by estimation, is 65 to 70%. The  left ventricle has normal function. Left ventricular endocardial border  not optimally defined to evaluate regional wall motion. Left ventricular  diastolic parameters are consistent  with Grade I diastolic dysfunction (impaired relaxation).  2. Right ventricular systolic function is hyperdynamic. The right  ventricular size is normal.  3. The mitral valve is grossly normal. No evidence of mitral valve  regurgitation.  4. The aortic valve is grossly normal. Aortic valve regurgitation is not  visualized.  5. The inferior vena cava is dilated in size with >50% respiratory  variability, suggesting right atrial pressure of 8 mmHg.    Neuro/Psych    GI/Hepatic PUD, GI hemorrhage   Endo/Other    Renal/GU      Musculoskeletal   Abdominal   Peds  Hematology  (+) Blood dyscrasia, anemia ,   Anesthesia Other Findings   Reproductive/Obstetrics                             Anesthesia Physical  Anesthesia Plan  ASA: 3  Anesthesia Plan: General   Post-op Pain Management: Ofirmev IV (intra-op)   Induction: Intravenous  PONV Risk Score and Plan: 2 and Ondansetron, Dexamethasone, Midazolam and Treatment may vary due to age or medical condition  Airway Management Planned: Oral ETT  Additional  Equipment: None  Intra-op Plan:   Post-operative Plan: Extubation in OR  Informed Consent: I have reviewed the patients History and Physical, chart, labs and discussed the procedure including the risks, benefits and alternatives for the proposed anesthesia with the patient or authorized representative who has indicated his/her understanding and acceptance.     Dental advisory given  Plan Discussed with: CRNA, Anesthesiologist and Surgeon  Anesthesia Plan Comments:         Anesthesia Quick Evaluation

## 2021-02-13 NOTE — H&P (Signed)
CC: Here today for surgery  HPI: Marc Mcdonald is an 65 y.o. male with history of COPD, reformed etoh/tobacco use, prior duodenal ulcer, presented with iron deficiency severe symptomatic anemia.  He was evaluated in the hospital 05/2020 and underwent robotic low anterior resection with end colostomy 05/30/20.  CT CAP 05/2020 - no evident metastatic disease  Postoperative course was somewhat complicated with regards to postoperative ileus versus bowel obstruction.  Ultimately this improved.  He was ultimately discharged home 06/21/20.   Pathology returned invasive moderately differentiated adenocarcinoma 4.6 cm invading into the muscular propria.  Margins are negative.  0/23 lymph nodes involved.  No LVI or PNI. pT3N0M0  He has met with medical oncology, Dr. Burr Medico, who has recommended surveillance.  He has developed a parastomal hernia which is occasionally uncomfortable at inferior border.  Otherwise has been well. He has a great appetite.  His colostomy is working quite well.  He denies abdominal pain, fevers/chills/nausea/vomiting.  His stool is semisolid and consistency. He has stated more than once now that he would rather die than live his life with a colostomy/parastomal hernia. He has been fitted for a abdominal binder/belt that has helped significantly with his parastomal hernia issues.  INTERVAL HX No changes in his health or health history since we met in the office. States he is ready for surgery. Tolerated bowel prep with satisfactory result.   PMH: COPD, alcohol abuse - now reformed, has 2-3 beers per week but more chocolate milk in its place, heavy smoking - now reformed, prior duodenal ulcer  PSH: Bilateral IHR; robotic LAR  FHx: Denies any known family history of colorectal, breast, endometrial or ovarian cancer  Social Hx: Previous heavy EtOH use; previous heavy tobacco use; denies drug use   Past Medical History:  Diagnosis Date   Colon cancer ([place])    COPD (chronic  obstructive pulmonary disease) ([place])    Duodenal ulcer    Hypertension     Past Surgical History:  Procedure Laterality Date   BALLOON DILATION N/A 05/26/2020   Procedure: BALLOON DILATION;  Surgeon: Lavena Bullion, DO;  Location: [facility];  Service: Endoscopy;  Laterality: N/A;   BIOPSY  05/26/2020   Procedure: BIOPSY;  Surgeon: Lavena Bullion, DO;  Location: [place];  Service: Endoscopy;;   COLONOSCOPY WITH PROPOFOL N/A 05/26/2020   Procedure: COLONOSCOPY WITH PROPOFOL;  Surgeon: Lavena Bullion, DO;  Location: [facility];  Service: Endoscopy;  Laterality: N/A;   COLOSTOMY N/A 05/30/2020   Procedure: COLOSTOMY;  Surgeon: Ileana Roup, MD;  Location: [place];  Service: General;  Laterality: N/A;   ESOPHAGOGASTRODUODENOSCOPY (EGD) WITH PROPOFOL N/A 05/26/2020   Procedure: ESOPHAGOGASTRODUODENOSCOPY (EGD) WITH PROPOFOL;  Surgeon: Lavena Bullion, DO;  Location: [place];  Service: Endoscopy;  Laterality: N/A;   HERNIA REPAIR     SUBMUCOSAL TATTOO INJECTION  05/26/2020   Procedure: SUBMUCOSAL TATTOO INJECTION;  Surgeon: Lavena Bullion, DO;  Location: [facility];  Service: Endoscopy;;   XI ROBOTIC ASSISTED LOWER ANTERIOR RESECTION N/A 05/30/2020   Procedure: XI ROBOTIC ASSISTED LOWER ANTERIOR RESECTION WITH END COLOSTOMY CREATION;  Surgeon: Ileana Roup, MD;  Location: [facility];  Service: General;  Laterality: N/A;    Family History  Family history unknown: Yes    Social:  reports that he quit smoking about 17 months ago. His smoking use included cigarettes. He has never used smokeless tobacco. He reports current alcohol use. He reports that he does not use drugs.  Allergies: No  Known Allergies  Medications: I have reviewed the patient's current medications.  No results found for this or any previous visit (from the past 48 hour(s)).  No results found.  ROS - all of the below systems have been reviewed with the patient and positives are  indicated with bold text General: chills, fever or night sweats Eyes: blurry vision or double vision ENT: epistaxis or sore throat Allergy/Immunology: itchy/watery eyes or nasal congestion Hematologic/Lymphatic: bleeding problems, blood clots or swollen lymph nodes Endocrine: temperature intolerance or unexpected weight changes Breast: new or changing breast lumps or nipple discharge Resp: cough, shortness of breath, or wheezing CV: chest pain or dyspnea on exertion GI: as per HPI GU: dysuria, trouble voiding, or hematuria MSK: joint pain or joint stiffness Neuro: TIA or stroke symptoms Derm: pruritus and skin lesion changes Psych: anxiety and depression  PE Blood pressure (!) 147/94, pulse (!) 118, temperature 98 F (36.7 C), temperature source Oral, resp. rate 18, height 5' 8"  (1.727 m), weight 64.4 kg, SpO2 98 %. Constitutional: NAD; conversant Eyes: Moist conjunctiva; no lid lag; anicteric Lungs: Normal respiratory effort CV: RRR; no pitting edema GI: Abd soft, NT/ND; ostomy with parastomal hernia soft MSK: Normal range of motion of extremities Psychiatric: Appropriate affect; alert and oriented x3  No results found for this or any previous visit (from the past 48 hour(s)).  No results found.   A/P: Marc Mcdonald is an 65 y.o. male very pleasant 65 y.o. male with hx of COPD, prior duodenal ulcer here for evaluation of parastomal hernia which is moderately symptomatic - following robotic LAR for rectosigmoid cancer 06/21/20  pT3N0M0  Alb 3.7 (08/28/20)  -Gastrograffin enema unremarkable, urine cotine test negative -The anatomy and physiology of the GI tract was reviewed with the patient, particularly as it pertains to his current anatomy -He is highly motivated to undergo colostomy reversal -We spent time today discussing robotic assisted colostomy takedown, closure of parastomal hernia, flexible sigmoidoscopy. -The planned procedures, material risks (including, but not  limited to, pain, bleeding, infection, scarring, need for blood transfusion, damage to surrounding structures- blood vessels/nerves/viscus/organs, injury to ureter, leak from anastomosis, need for additional procedures, scenarios where another stoma may be necessary and in his case may very well be permanent, worsening of pre-existing medical conditions, recurrent hernia, pneumonia, heart attack, stroke, death) benefits and alternatives to surgery were discussed at length. The patient's questions were answered to his satisfaction, he voiced understanding and elected to proceed with surgery. Additionally, we discussed typical postoperative expectations and the recovery process.  Nadeen Landau, MD [facility], [facility]

## 2021-02-13 NOTE — Op Note (Signed)
PATIENT: Marc Mcdonald  65 y.o. male  Patient Care Team: [place], Blondell Reveal, MD as PCP - General (Family Medicine) Truitt Merle, MD as Consulting Physician (Oncology) Jonnie Finner, RN (Inactive) as Oncology Nurse Navigator  PREOP DIAGNOSIS: COLSOTOMY STATUS  POSTOP DIAGNOSIS: COLSOTOMY STATUS  PROCEDURE:  Robotic assisted colostomy takedown with stapled colorectal anastomosis Diagnostic flexible sigmoidoscopy - to assess rectal stump prior to anastomosis Closure of 8 x 8 cm parastomal hernia Intraoperative assessment of perfusion using ICG fluorescence imaging Bilateral transversus abdominus plane (TAP) blocks  SURGEON: Sharon Mt. Mozelle Remlinger, MD  ASSISTANT: Leighton Ruff, MD  ANESTHESIA: General endotracheal  EBL: 100 mL Total I/O In: 1100 [I.V.:1000; IV Piggyback:100] Out: 140 [Urine:40; Blood:100]  DRAINS: None  SPECIMEN: Colostomy  COUNTS: Sponge, needle and instrument counts were reported correct x2  FINDINGS: Some small bowel adhesions within the pelvis that were able to be lysed sharply.  Rectal stump is healthy in appearance.  The mucosa is normal in appearance.  There is no evidence of recurrence intraluminally or within the pelvis.  The peritoneal surfaces are normal in appearance.  Perfusion test demonstrates avid uptake of tracer within rectal tissues and out to his colostomy. The colostomy is able to be taken down and a well perfused, tension free, hemostatic, air tight 29 mm EEA colorectal anastomosis fashioned 8 cm from the anal verge by flexible sigmoidoscopy.   NARRATIVE: Informed consent was verified. The patient was taken to the operating room, placed supine on the operating table and SCD's were applied. General endotracheal anesthesia was induced without difficulty. He was then positioned in the lithotomy position with Allen stirrups.  Pressure points were evaluated and padded.  A foley catheter was then placed by nursing under sterile conditions. Hair  on the abdomen was clipped.  He was secured to the operating table. The abdomen was then prepped and draped in the standard sterile fashion. Surgical timeout was called indicating the correct patient, procedure, positioning and need for preoperative antibiotics.   An OG tube was placed by anesthesia and confirmed to be to suction.  At Palmer's point, a stab incision was created and the Veress needle was introduced into the peritoneal cavity on the first attempt.  Intraperitoneal location was confirmed by the aspiration and saline drop test.  Pneumoperitoneum was established to a maximum pressure of 15 mmHg using CO2.  Following this, the abdomen was marked for planned trocar sites.  Just to the right and cephalad to the umbilicus, an 8 mm incision was created and an 8 mm blunt tipped robotic trocar was cautiously placed into the peritoneal cavity.  The laparoscope was inserted and demonstrated no evidence of trocar site nor Veress needle site complications.  The Veress needle was removed.  Bilateral transversus abdominis plane blocks were then created using a dilute mixture of Exparel with Marcaine.  3 additional 8 mm robotic trochars were placed under direct visualization roughly in a line extending from the right ASIS towards the left upper quadrant. The bladder was inspected and noted to be at/below the pubic symphysis.  Staying 3 fingerbreadths above the pubic symphysis, an incision was created and the 12 mm robotic trocar inserted directed cephalad into the peritoneal cavity under direct visualization.  An additional 5 mm assist port was placed in the right lateral abdomen under direct visualization.  The abdomen was surveyed and there was a few small bowel adhesions in the deep pelvis but no other significant findings.  The peritoneal surfaces are normal in appearance.  There were no gross liver surface lesions. He was positioned in Trendelenburg with the left side tilted slightly up.  Small bowel was  carefully retracted out of the pelvis.  The robot was then docked and I went to the console.  We began with adhesiolysis. Adhesions of the small bowel to the rectal stump were carefully lysed sharply using scissors.  After releasing all adhesions, these loops of small bowel were inspected and noted to be without any apparent injury.   The rectal stump is able to be identified.  This is circumferentially dissected using a TME plane.  The space between the fascia propria the rectum and the presacral fascia is identified and delineated.  The hypogastric nerves were protected free of injury.   Working more proximally, the left ureter was identified and protected free of injury.  The left gonadal vessels were identified and protected.  These were both swept "down."   All adhesions have been separated from the colostomy and the abdominal wall.  There is a sizable parastomal hernia with an approximate 8 x 8 cm defect.  This is empty at the time of surgery with the exception of his known colostomy.  The descending colon had been mobilized all the way up to the level of the splenic flexure and there was more than adequate reach to the deep pelvis without any further mobilization.  A perfusion test was then completed using ICG.  There is avid uptake of the tracer out to the level of the dissected rectum.  Going all the way out to the level of the ostomy, there is excellent uptake of the ICG as well.  Flexible sigmoidoscopy was then completed.  I introduced the flexible sigmoidoscope through the anus into the rectum.  The quality of the tissues is good.  There is no evidence of intraluminal recurrence or polyps. The stump is 8 cm in length.  Attention was then directed at the colostomy.  I scrubbed back in.  The colostomy was circumferentially incised.  The subcutaneous fat was divided.  The colostomy was able to be circumferentially dissected free of the surrounding fascia.  The colostomy was then inspected and  there is no evidence of injury.  Just proximal to the mucocutaneous junction, the mesentery was cleared and ligated.  A pursestring device was then placed.  A 2-0 Prolene on a Keith needle was passed.  The colostomy was excised and passed off the specimen.  3-0 silk sutures were used to create belt loops around the pursestring.  EEA sizers were passed and a 29 mm EEA selected.  The anvil was placed in the pursestring tied.  A small amount of fat was cleared from the planned staple line.  Quality of the tissues is good.  This is placed back into the abdomen.  An [place] wound protector was placed. A cap is placed and pneumoperitoneum reestablished.  The anvil reaches into the deep pelvis without any tension and remains in that location.  I then went below to pass the stapler.  Under direct visualization, EEA sizers were serially passed.  The 29 mm EEA stapler was passed.  The spike is deployed just anterior to the staple line.  The components were then mated.  Orientation is confirmed such that there is no twisting of the colon or small bowel underneath the mesenteric defect.  The stapler was then closed, held, and fired.  Colon proximally anastomosis is gently occluded.  The pelvis was filled with irrigation.  I passed the flexible sigmoidoscope  to perform a leak test.  The anastomosis is hemostatic in appearance and airtight.  All tissues are pink in color. This is located at 8 cm from the anal verge by flexible sigmoidoscopy. Additionally, looking from above, there is no tension on the colon or mesentery.  Sigmoidoscope was withdrawn. I scrubbed back in. Irrigation was evacuated from the pelvis.  The abdomen and pelvis are surveyed and noted to be completely hemostatic without any apparent injury.  Under direct visualization, all trochars are removed.  The [place] wound protector was removed.    He has a fairly sizable hernia sac.  This is fully excised to allow [place] to visualize the edges of the rectus fascia.   Kocher clamps were placed on the rectus fascia and were able to bring this together without any significant amount of tension.  The rectus fascia at the former colostomy site was then closed using 2 running #1 PDS sutures, longitudinally. The fascia was then palpated and noted to be completely closed.  Additional anesthetic was infiltrated at this site.   Sponge, needle, and instrument counts were reported correct x2. 4-0 Monocryl subcuticular suture was used to close the skin of all incision sites.  Dermabond was placed over all incisions.  A 0 Vicryl pursestring suture was used to cinch the skin down at the former colostomy site.  The wound is then packed with moist kerlex. Additional gauze was placed and secured with tape.  He was then taken out of lithotomy, awakened from anesthesia, extubated, and transferred to a stretcher for transport to PACU in satisfactory condition having tolerated the procedure well.

## 2021-02-13 NOTE — Transfer of Care (Signed)
Immediate Anesthesia Transfer of Care Note  Patient: Marc Mcdonald  Procedure(s) Performed: XI ROBOTIC ASSISTED COLOSTOMY TAKEDOWN, TAP BLOCK (Abdomen) FLEXIBLE SIGMOIDOSCOPY (Rectum) PRIMARY REPAIR CLOSURE OF PARASTOMAL HERNIA, INTRAOPERATIVE ASSESSMENT OF PERFUSION (Abdomen)  Patient Location: PACU  Anesthesia Type:General  Level of Consciousness: awake, alert  and patient cooperative  Airway & Oxygen Therapy: Patient Spontanous Breathing and Patient connected to face mask oxygen  Post-op Assessment: Report given to RN and Post -op Vital signs reviewed and stable  Post vital signs: Reviewed and stable  Last Vitals:  Vitals Value Taken Time  BP 147/88 02/13/21 1508  Temp    Pulse 92 02/13/21 1509  Resp 20 02/13/21 1509  SpO2 100 % 02/13/21 1509  Vitals shown include unvalidated device data.  Last Pain:  Vitals:   02/13/21 1129  TempSrc:   PainSc: 0-No pain         Complications: No notable events documented.

## 2021-02-14 ENCOUNTER — Encounter (HOSPITAL_COMMUNITY): Payer: Self-pay | Admitting: Surgery

## 2021-02-14 LAB — BASIC METABOLIC PANEL
Anion gap: 8 (ref 5–15)
BUN: <5 mg/dL — ABNORMAL LOW (ref 8–23)
CO2: 25 mmol/L (ref 22–32)
Calcium: 8.5 mg/dL — ABNORMAL LOW (ref 8.9–10.3)
Chloride: 103 mmol/L (ref 98–111)
Creatinine, Ser: 0.57 mg/dL — ABNORMAL LOW (ref 0.61–1.24)
GFR, Estimated: >60 mL/min (ref >60)
Glucose, Bld: 138 mg/dL — ABNORMAL HIGH (ref 70–99)
Potassium: 3.9 mmol/L (ref 3.5–5.1)
Sodium: 136 mmol/L (ref 135–145)

## 2021-02-14 LAB — CBC
HCT: 42.3 % (ref 39.0–52.0)
Hemoglobin: 14.2 g/dL (ref 13.0–17.0)
MCH: 33.5 pg (ref 26.0–34.0)
MCHC: 33.6 g/dL (ref 30.0–36.0)
MCV: 99.8 fL (ref 80.0–100.0)
Platelets: 285 10*3/uL (ref 150–400)
RBC: 4.24 MIL/uL (ref 4.22–5.81)
RDW: 14.7 % (ref 11.5–15.5)
WBC: 12.5 10*3/uL — ABNORMAL HIGH (ref 4.0–10.5)
nRBC: 0 % (ref 0.0–0.2)

## 2021-02-14 LAB — SURGICAL PATHOLOGY

## 2021-02-14 MED ORDER — TRAMADOL HCL 50 MG PO TABS: 50.0000 mg | ORAL_TABLET | Freq: Four times a day (QID) | ORAL | 0 refills | Status: DC | PRN

## 2021-02-14 NOTE — Progress Notes (Signed)
Subjective No acute events. Feeling well. Up in chair. Sore but pain controlled. Tolerating lots of clears without n/v. Denies distention. Denies flatus/bm yet  Objective: Vital signs in last 24 hours: Temp:  [97.4 F (36.3 C)-98.8 F (37.1 C)] 97.8 F (36.6 C) (12/22 0931) Pulse Rate:  [82-118] 90 (12/22 0931) Resp:  [10-21] 18 (12/22 0540) BP: (117-149)/(81-94) 128/81 (12/22 0931) SpO2:  [93 %-100 %] 96 % (12/22 0931) Weight:  [64.4 kg] 64.4 kg (12/21 1129) Last BM Date: 02/13/21  Intake/Output from previous day: 12/21 0701 - 12/22 0700 In: 1540 [P.O.:220; I.V.:1216; IV Piggyback:300] Out: 990 [Urine:890; Blood:100] Intake/Output this shift: No intake/output data recorded.  Gen: NAD, comfortable CV: RRR Pulm: Normal work of breathing Abd: Soft, appropriate incisional tenderness, nondistended, no rebound nor guarding Ext: SCDs in place  Lab Results: CBC  Recent Labs    02/14/21 0423  WBC 12.5*  HGB 14.2  HCT 42.3  PLT 285   BMET Recent Labs    02/14/21 0423  NA 136  K 3.9  CL 103  CO2 25  GLUCOSE 138*  BUN <5*  CREATININE 0.57*  CALCIUM 8.5*   PT/INR No results for input(s): LABPROT, INR in the last 72 hours. ABG No results for input(s): PHART, HCO3 in the last 72 hours.  Invalid input(s): PCO2, PO2  Studies/Results:  Anti-infectives: Anti-infectives (From admission, onward)    Start     Dose/Rate Route Frequency Ordered Stop   02/13/21 1400  neomycin (MYCIFRADIN) tablet 1,000 mg  Status:  Discontinued       See Hyperspace for full Linked Orders Report.   1,000 mg Oral 3 times per day 02/13/21 1109 02/13/21 1113   02/13/21 1400  metroNIDAZOLE (FLAGYL) tablet 1,000 mg  Status:  Discontinued       See Hyperspace for full Linked Orders Report.   1,000 mg Oral 3 times per day 02/13/21 1109 02/13/21 1113   02/13/21 1115  cefoTEtan (CEFOTAN) 2 g in sodium chloride 0.9 % 100 mL IVPB        2 g 200 mL/hr over 30 Minutes Intravenous On call to O.R.  02/13/21 1109 02/13/21 1307        Assessment/Plan: Patient Active Problem List   Diagnosis Date Noted   S/P colostomy takedown 02/13/2021   Insomnia 07/20/2020   Pulmonary nodule 06/26/2020   History of creation of ostomy ([place]) 06/26/2020   Hyponatremia 06/06/2020   sigmoid colon cancer 06/03/2020   Ileus following gastrointestinal surgery (HCC)    Aortic atherosclerosis ([place]) 06/01/2020   Protein-calorie malnutrition, severe 05/28/2020   Iron deficiency anemia due to chronic blood loss    Duodenal ulcer    Gastritis and gastroduodenitis    Gastroesophageal reflux disease with esophagitis without hemorrhage    Esophageal stricture    Rectal mass    Dyspnea 05/25/2020   Hypotension 05/25/2020   Tachycardia 05/25/2020   Phase of life problem 05/25/2020   Pruritus 05/25/2020   Symptomatic anemia 05/25/2020   Surgical follow-up care 10/12/2019   Left-sided chest wall pain 09/14/2019   Closed traumatic fracture of ribs of left side with pneumothorax 09/09/2019   s/p Procedure(s): XI ROBOTIC ASSISTED COLOSTOMY TAKEDOWN, TAP BLOCK FLEXIBLE SIGMOIDOSCOPY PRIMARY REPAIR CLOSURE OF PARASTOMAL HERNIA, INTRAOPERATIVE ASSESSMENT OF PERFUSION 02/13/2021  -Doing well -Ambulate 5x/day -Full liquids, advance to soft as tolerated -D/C IVF -Ppx: SQH, SCDs -We spent time reviewing his procedure, findings, and plans moving forward.  His son is at bedside.  We discussed expectations.  All  of his questions were answered and he expressed understanding   LOS: 1 day   Nadeen Landau, MD [facility], [place]

## 2021-02-14 NOTE — Discharge Instructions (Signed)
POST OP INSTRUCTIONS AFTER COLON SURGERY  DIET: Be sure to include lots of fluids daily to stay hydrated - 64oz of water per day (8, 8 oz glasses).  Avoid fast food or heavy meals for the first couple of weeks as your are more likely to get nauseated. Avoid raw/uncooked fruits or vegetables for the first 4 weeks (its ok to have these if they are blended into smoothie form). If you have fruits/vegetables, make sure they are cooked until soft enough to mash on the roof of your mouth and chew your food well. Otherwise, diet as tolerated.  Take your usually prescribed home medications unless otherwise directed.  PAIN CONTROL: Pain is best controlled by a usual combination of three different methods TOGETHER: Ice/Heat Over the counter pain medication Prescription pain medication Most patients will experience some swelling and bruising around the surgical site.  Ice packs or heating pads (30-60 minutes up to 6 times a day) will help. Some people prefer to use ice alone, heat alone, alternating between ice & heat.  Experiment to what works for you.  Swelling and bruising can take several weeks to resolve.   It is helpful to take an over-the-counter pain medication regularly for the first few weeks: Acetaminophen (Tylenol) - you may take 650mg  every 6 hours as needed. You can take this with motrin as they act differently on the body. If you are taking a narcotic pain medication that has acetaminophen in it, do not take over the counter tylenol at the same time. A  prescription for pain medication should be given to you upon discharge.  Take your pain medication as prescribed if your pain is not adequatly controlled with the over-the-counter pain reliefs mentioned above.  Avoid getting constipated.  Between the surgery and the pain medications, it is common to experience some constipation.  Increasing fluid intake and taking a fiber supplement (such as Metamucil, Citrucel, FiberCon, MiraLax, etc) 1-2 times a  day regularly will usually help prevent this problem from occurring.  A mild laxative (prune juice, Milk of Magnesia, MiraLax, etc) should be taken according to package directions if there are no bowel movements after 48 hours.    Dressing: Your incisions are covered in Dermabond which is like sterile superglue for the skin. This will come off on it's own in a couple weeks. It is waterproof and you may bathe normally starting the day after your surgery in a shower. Avoid baths/pools/lakes/oceans until your wounds have fully healed. Where your colostomy used to be, you may cover this with a piece of clean dry gauze and change 1-2x/day until the wound heals. It is ok to bathe normally with soap/water of the former colostomy site. This actually helps keep it clean!  ACTIVITIES as tolerated:   Avoid heavy lifting (>10lbs or 1 gallon of milk) for the next 6 weeks. You may resume regular daily activities as tolerated--such as daily self-care, walking, climbing stairs--gradually increasing activities as tolerated.  If you can walk 30 minutes without difficulty, it is safe to try more intense activity such as jogging, treadmill, bicycling, low-impact aerobics.  DO NOT PUSH THROUGH PAIN.  Let pain be your guide: If it hurts to do something, don't do it. You may drive when you are no longer taking prescription pain medication, you can comfortably wear a seatbelt, and you can safely maneuver your car and apply brakes.  FOLLOW UP in our office Please call [facility] at [phone] to set up an appointment to see your surgeon  in the office for a follow-up appointment approximately 2 weeks after your surgery. Make sure that you call for this appointment the day you arrive home to insure a convenient appointment time.  9. If you have disability or family leave forms that need to be completed, you may have them completed by your primary care physician's office; for return to work instructions, please ask our office staff  and they will be happy to assist you in obtaining this documentation   When to call us [phone]: Poor pain control Reactions / problems with new medications (rash/itching, etc)  Fever over 101.5 F (38.5 C) Inability to urinate Nausea/vomiting Worsening swelling or bruising Continued bleeding from incision. Increased pain, redness, or drainage from the incision  The clinic staff is available to answer your questions during regular business hours (8:30am-5pm).  Please dont hesitate to call and ask to speak to one of our nurses for clinical concerns.   A surgeon from [facility] is always on call at the hospitals   If you have a medical emergency, go to the nearest emergency room or call 911.  [facility], [place] [address] [place], [place], [place]  [address] MAIN: [phone] FAX: [phone] [email or web address]

## 2021-02-15 LAB — CBC
HCT: 42.3 % (ref 39.0–52.0)
Hemoglobin: 13.8 g/dL (ref 13.0–17.0)
MCH: 32.5 pg (ref 26.0–34.0)
MCHC: 32.6 g/dL (ref 30.0–36.0)
MCV: 99.8 fL (ref 80.0–100.0)
Platelets: 251 10*3/uL (ref 150–400)
RBC: 4.24 MIL/uL (ref 4.22–5.81)
RDW: 14.7 % (ref 11.5–15.5)
WBC: 9.5 10*3/uL (ref 4.0–10.5)
nRBC: 0 % (ref 0.0–0.2)

## 2021-02-15 LAB — BASIC METABOLIC PANEL
Anion gap: 9 (ref 5–15)
BUN: 7 mg/dL — ABNORMAL LOW (ref 8–23)
CO2: 26 mmol/L (ref 22–32)
Calcium: 8.8 mg/dL — ABNORMAL LOW (ref 8.9–10.3)
Chloride: 103 mmol/L (ref 98–111)
Creatinine, Ser: 0.55 mg/dL — ABNORMAL LOW (ref 0.61–1.24)
GFR, Estimated: >60 mL/min (ref >60)
Glucose, Bld: 95 mg/dL (ref 70–99)
Potassium: 3.5 mmol/L (ref 3.5–5.1)
Sodium: 138 mmol/L (ref 135–145)

## 2021-02-15 NOTE — Progress Notes (Signed)
Mobility Specialist - Progress Note    02/15/21 1236  Mobility  Activity Ambulated in hall  Level of Assistance Standby assist, set-up cues, supervision of patient - no hands on  Assistive Device Front wheel walker  Distance Ambulated (ft) 600 ft  Mobility Ambulated with assistance in hallway  Mobility Response Tolerated well  Mobility performed by Mobility specialist  $Mobility charge 1 Mobility   Pt agreeable to mobilize. Used RW to ambulate 600 ft in hallway. Pt c/o of pain in abdomen secondary to surgical wound. Pt returned to room after session and left sitting up in recliner with call bell at side.   [place] Specialist Acute Rehabilitation Services Phone: [phone] 02/15/21, 12:38 PM

## 2021-02-15 NOTE — Progress Notes (Signed)
°  Transition of Care [facility]) Screening Note   Patient Details  Name: KARTIER BENNISON Date of Birth: 05/31/1955   Transition of Care (TOC) CM/SW Contact:    Joaquin Courts, RN Phone Number: 02/15/2021, 12:56 PM    Transition of Care Department [facility]) has reviewed patient and no TOC needs have been identified at this time. We will continue to monitor patient advancement through interdisciplinary progression rounds. If new patient transition needs arise, please place a TOC consult.

## 2021-02-15 NOTE — Progress Notes (Signed)
Subjective No acute events. Feeling well. Sore but pain controlled. Tolerating lots of fulls without n/v. Denies distention. Denies flatus/bm yet  Objective: Vital signs in last 24 hours: Temp:  [97.8 F (36.6 C)-98.4 F (36.9 C)] 98.4 F (36.9 C) (12/23 0616) Pulse Rate:  [90-100] 93 (12/23 0616) Resp:  [18] 18 (12/23 0616) BP: (124-135)/(77-89) 135/89 (12/23 0616) SpO2:  [93 %-96 %] 95 % (12/23 0616) Weight:  [62.5 kg] 62.5 kg (12/23 0500) Last BM Date: 02/13/21  Intake/Output from previous day: 12/22 0701 - 12/23 0700 In: 600 [P.O.:600] Out: 1025 [Urine:1025] Intake/Output this shift: No intake/output data recorded.  Gen: NAD, comfortable CV: RRR Pulm: Normal work of breathing Abd: Soft, appropriate incisional tenderness, nondistended, no rebound nor guarding Ext: SCDs in place  Lab Results: CBC  Recent Labs    02/14/21 0423 02/15/21 0447  WBC 12.5* 9.5  HGB 14.2 13.8  HCT 42.3 42.3  PLT 285 251   BMET Recent Labs    02/14/21 0423 02/15/21 0447  NA 136 138  K 3.9 3.5  CL 103 103  CO2 25 26  GLUCOSE 138* 95  BUN <5* 7*  CREATININE 0.57* 0.55*  CALCIUM 8.5* 8.8*   PT/INR No results for input(s): LABPROT, INR in the last 72 hours. ABG No results for input(s): PHART, HCO3 in the last 72 hours.  Invalid input(s): PCO2, PO2  Studies/Results:  Anti-infectives: Anti-infectives (From admission, onward)    Start     Dose/Rate Route Frequency Ordered Stop   02/13/21 1400  neomycin (MYCIFRADIN) tablet 1,000 mg  Status:  Discontinued       See Hyperspace for full Linked Orders Report.   1,000 mg Oral 3 times per day 02/13/21 1109 02/13/21 1113   02/13/21 1400  metroNIDAZOLE (FLAGYL) tablet 1,000 mg  Status:  Discontinued       See Hyperspace for full Linked Orders Report.   1,000 mg Oral 3 times per day 02/13/21 1109 02/13/21 1113   02/13/21 1115  cefoTEtan (CEFOTAN) 2 g in sodium chloride 0.9 % 100 mL IVPB        2 g 200 mL/hr over 30 Minutes  Intravenous On call to O.R. 02/13/21 1109 02/13/21 1307        Assessment/Plan: Patient Active Problem List   Diagnosis Date Noted   S/P colostomy takedown 02/13/2021   Insomnia 07/20/2020   Pulmonary nodule 06/26/2020   History of creation of ostomy ([place]) 06/26/2020   Hyponatremia 06/06/2020   sigmoid colon cancer 06/03/2020   Ileus following gastrointestinal surgery (HCC)    Aortic atherosclerosis ([place]) 06/01/2020   Protein-calorie malnutrition, severe 05/28/2020   Iron deficiency anemia due to chronic blood loss    Duodenal ulcer    Gastritis and gastroduodenitis    Gastroesophageal reflux disease with esophagitis without hemorrhage    Esophageal stricture    Rectal mass    Dyspnea 05/25/2020   Hypotension 05/25/2020   Tachycardia 05/25/2020   Phase of life problem 05/25/2020   Pruritus 05/25/2020   Symptomatic anemia 05/25/2020   Surgical follow-up care 10/12/2019   Left-sided chest wall pain 09/14/2019   Closed traumatic fracture of ribs of left side with pneumothorax 09/09/2019   s/p Procedure(s): XI ROBOTIC ASSISTED COLOSTOMY TAKEDOWN, TAP BLOCK FLEXIBLE SIGMOIDOSCOPY PRIMARY REPAIR CLOSURE OF PARASTOMAL HERNIA, INTRAOPERATIVE ASSESSMENT OF PERFUSION 02/13/2021  -Doing well -Ambulate 5x/day -Soft diet -Ppx: SQH, SCDs -We spent time reviewing his procedure, findings, and plans moving forward.  His son is at bedside.  We discussed expectations.  All of his questions were answered and he expressed understanding   LOS: 2 days   Nadeen Landau, MD [facility] Surgery, [place]

## 2021-02-16 LAB — CBC
HCT: 42.9 % (ref 39.0–52.0)
Hemoglobin: 13.8 g/dL (ref 13.0–17.0)
MCH: 32.2 pg (ref 26.0–34.0)
MCHC: 32.2 g/dL (ref 30.0–36.0)
MCV: 100.2 fL — ABNORMAL HIGH (ref 80.0–100.0)
Platelets: 279 10*3/uL (ref 150–400)
RBC: 4.28 MIL/uL (ref 4.22–5.81)
RDW: 14.6 % (ref 11.5–15.5)
WBC: 10.9 10*3/uL — ABNORMAL HIGH (ref 4.0–10.5)
nRBC: 0 % (ref 0.0–0.2)

## 2021-02-16 LAB — BASIC METABOLIC PANEL
Anion gap: 6 (ref 5–15)
BUN: 8 mg/dL (ref 8–23)
CO2: 30 mmol/L (ref 22–32)
Calcium: 8.8 mg/dL — ABNORMAL LOW (ref 8.9–10.3)
Chloride: 102 mmol/L (ref 98–111)
Creatinine, Ser: 0.53 mg/dL — ABNORMAL LOW (ref 0.61–1.24)
GFR, Estimated: >60 mL/min (ref >60)
Glucose, Bld: 96 mg/dL (ref 70–99)
Potassium: 3.6 mmol/L (ref 3.5–5.1)
Sodium: 138 mmol/L (ref 135–145)

## 2021-02-16 MED ORDER — OXYCODONE HCL 5 MG PO TABS
5.0000 mg | ORAL_TABLET | Freq: Four times a day (QID) | ORAL | Status: DC | PRN
  Administered 2021-02-16: 23:00:00 5 mg via ORAL
  Administered 2021-02-16: 12:00:00 10 mg via ORAL
  Administered 2021-02-16 – 2021-02-17 (×3): 5 mg via ORAL
  Filled 2021-02-16 (×3): qty 2
  Filled 2021-02-16 (×2): qty 1

## 2021-02-16 NOTE — Progress Notes (Signed)
Subjective No acute events. Feeling well. pain controlled with IV narcotics, Tramadol doesn't help him. Tolerating soft diet. Had a BM this AM  Objective: Vital signs in last 24 hours: Temp:  [98 F (36.7 C)-98.4 F (36.9 C)] 98.3 F (36.8 C) (12/24 8916) Pulse Rate:  [95-108] 106 (12/24 0614) Resp:  [17-20] 20 (12/24 0614) BP: (129-134)/(74-88) 134/88 (12/24 0614) SpO2:  [92 %-95 %] 95 % (12/24 0614) Weight:  [62.7 kg] 62.7 kg (12/24 0500) Last BM Date: 02/16/21  Intake/Output from previous day: 12/23 0701 - 12/24 0700 In: 360 [P.O.:360] Out: 1050 [Urine:1050] Intake/Output this shift: No intake/output data recorded.  Gen: NAD, comfortable Abd: Soft, appropriate incisional tenderness, nondistended, no rebound nor guarding Ext: SCDs in place  Lab Results: CBC  Recent Labs    02/15/21 0447 02/16/21 0422  WBC 9.5 10.9*  HGB 13.8 13.8  HCT 42.3 42.9  PLT 251 279    BMET Recent Labs    02/15/21 0447 02/16/21 0422  NA 138 138  K 3.5 3.6  CL 103 102  CO2 26 30  GLUCOSE 95 96  BUN 7* 8  CREATININE 0.55* 0.53*  CALCIUM 8.8* 8.8*    PT/INR No results for input(s): LABPROT, INR in the last 72 hours. ABG No results for input(s): PHART, HCO3 in the last 72 hours.  Invalid input(s): PCO2, PO2  Studies/Results:  Anti-infectives: Anti-infectives (From admission, onward)    Start     Dose/Rate Route Frequency Ordered Stop   02/13/21 1400  neomycin (MYCIFRADIN) tablet 1,000 mg  Status:  Discontinued       See Hyperspace for full Linked Orders Report.   1,000 mg Oral 3 times per day 02/13/21 1109 02/13/21 1113   02/13/21 1400  metroNIDAZOLE (FLAGYL) tablet 1,000 mg  Status:  Discontinued       See Hyperspace for full Linked Orders Report.   1,000 mg Oral 3 times per day 02/13/21 1109 02/13/21 1113   02/13/21 1115  cefoTEtan (CEFOTAN) 2 g in sodium chloride 0.9 % 100 mL IVPB        2 g 200 mL/hr over 30 Minutes Intravenous On call to O.R. 02/13/21 1109  02/13/21 1307        Assessment/Plan: Patient Active Problem List   Diagnosis Date Noted   S/P colostomy takedown 02/13/2021   Insomnia 07/20/2020   Pulmonary nodule 06/26/2020   History of creation of ostomy ([place]) 06/26/2020   Hyponatremia 06/06/2020   sigmoid colon cancer 06/03/2020   Ileus following gastrointestinal surgery (HCC)    Aortic atherosclerosis ([place]) 06/01/2020   Protein-calorie malnutrition, severe 05/28/2020   Iron deficiency anemia due to chronic blood loss    Duodenal ulcer    Gastritis and gastroduodenitis    Gastroesophageal reflux disease with esophagitis without hemorrhage    Esophageal stricture    Rectal mass    Dyspnea 05/25/2020   Hypotension 05/25/2020   Tachycardia 05/25/2020   Phase of life problem 05/25/2020   Pruritus 05/25/2020   Symptomatic anemia 05/25/2020   Surgical follow-up care 10/12/2019   Left-sided chest wall pain 09/14/2019   Closed traumatic fracture of ribs of left side with pneumothorax 09/09/2019   s/p Procedure(s): XI ROBOTIC ASSISTED COLOSTOMY TAKEDOWN, TAP BLOCK FLEXIBLE SIGMOIDOSCOPY PRIMARY REPAIR CLOSURE OF PARASTOMAL HERNIA, INTRAOPERATIVE ASSESSMENT OF PERFUSION 02/13/2021  -Doing well -Ambulate 5x/day -Soft diet -Ppx: SQH, SCDs - switch to Oxycodone for pain -Will have RN staff go over dressing changes with his son today.     LOS: 3  days   Rosario Adie, MD  [facility]

## 2021-02-17 MED ORDER — OXYCODONE HCL 5 MG PO TABS: 5.0000 mg | ORAL_TABLET | Freq: Four times a day (QID) | ORAL | 0 refills | Status: DC | PRN

## 2021-02-17 NOTE — Progress Notes (Signed)
Assessment unchanged. Pt verbalized understanding of dsg change, medications, and follow up care through teach back. Explained in detail when changing dressing this am and pt said "my son and I can do that". Supplies provided for dressing change. Verified with son he understood dsg change as well before leaving. Discharged via wc to front entrance accompanied by NT and son.

## 2021-02-17 NOTE — Discharge Summary (Signed)
Physician Discharge Summary  Patient ID: Marc Mcdonald MRN: [id number] DOB/AGE: 09-17-1955 65 y.o.  Admit date: 02/13/2021 Discharge date: 02/17/2021  Admission Diagnoses: Colostomy in place Discharge Diagnoses:  Principal Problem:   S/P colostomy takedown   Discharged Condition: good  Hospital Course: Patient was admitted to the med surg floor after surgery.  Diet was advanced as tolerated.  Patient began to have bowel function on postop day 3.  By postop day 4, He was tolerating a solid diet and pain was controlled with oral medications.  He was urinating without difficulty and ambulating without assistance.  Patient was felt to be in stable condition for discharge to home.   Consults: None  Significant Diagnostic Studies: labs: cbc, bmet  Treatments: IV hydration, analgesia: acetaminophen and oxycodone, and surgery: robotic colostomy reversal  Discharge Exam: Blood pressure 130/85, pulse 96, temperature 98.7 F (37.1 C), temperature source Oral, resp. rate 18, height 5\' 8"  (1.727 m), weight 62.5 kg, SpO2 96 %. General appearance: alert and cooperative GI: soft, non-tender; bowel sounds normal; no masses,  no organomegaly Inc: wound open and packed  Disposition: Discharge disposition: 01-Home or Self Care        Allergies as of 02/17/2021   No Known Allergies      Medication List     TAKE these medications    acetaminophen 325 MG tablet Commonly known as: TYLENOL Take 2 tablets (650 mg total) by mouth every 6 (six) hours.   amLODipine 5 MG tablet Commonly known as: NORVASC Take 1 tablet (5 mg total) by mouth daily.   childrens multivitamin chewable tablet Chew 1 tablet by mouth daily.   multivitamin with minerals tablet Take 1 tablet by mouth daily.   oxyCODONE 5 MG immediate release tablet Commonly known as: Oxy IR/ROXICODONE Take 1-2 tablets (5-10 mg total) by mouth every 6 (six) hours as needed for moderate pain, severe pain or breakthrough  pain.   pantoprazole 40 MG tablet Commonly known as: PROTONIX TAKE 1 TABLET BY MOUTH EVERY DAY   traZODone 50 MG tablet Commonly known as: DESYREL TAKE 1 TABLET BY MOUTH EVERYDAY AT BEDTIME        Follow-up Information     Ileana Roup, MD Follow up.   Specialties: General Surgery, Colon and Rectal Surgery Contact information: [address] [place] [id number] [phone]                 Signed: Rosario Adie 66/28/0034, 8:48 AM

## 2021-02-17 NOTE — Plan of Care (Signed)

## 2021-03-15 ENCOUNTER — Other Ambulatory Visit: Payer: Self-pay

## 2021-03-15 ENCOUNTER — Encounter (HOSPITAL_BASED_OUTPATIENT_CLINIC_OR_DEPARTMENT_OTHER): Payer: Self-pay | Admitting: Family Medicine

## 2021-03-15 ENCOUNTER — Ambulatory Visit (INDEPENDENT_AMBULATORY_CARE_PROVIDER_SITE_OTHER): Payer: Medicare Other | Admitting: Family Medicine

## 2021-03-15 VITALS — BP 136/64 | HR 67 | Ht 68.0 in | Wt 131.0 lb

## 2021-03-15 DIAGNOSIS — G4700 Insomnia, unspecified: Secondary | ICD-10-CM

## 2021-03-15 DIAGNOSIS — I10 Essential (primary) hypertension: Secondary | ICD-10-CM | POA: Insufficient documentation

## 2021-03-15 MED ORDER — AMLODIPINE BESYLATE 5 MG PO TABS: 5.0000 mg | ORAL_TABLET | Freq: Every day | ORAL | 1 refills | Status: DC

## 2021-03-15 MED ORDER — TRAZODONE HCL 50 MG PO TABS: 50.0000 mg | ORAL_TABLET | Freq: Every evening | ORAL | 1 refills | Status: DC | PRN

## 2021-03-15 NOTE — Progress Notes (Signed)
° ° °  Procedures performed today:    None.  Independent interpretation of notes and tests performed by another provider:   None.  Brief History, Exam, Impression, and Recommendations:    BP 136/64    Pulse 67    Ht 5\' 8"  (1.727 m)    Wt 131 lb (59.4 kg)    SpO2 98%    BMI 19.92 kg/m   Insomnia Patient requesting refill of trazodone today.  He reports that this has been helpful in regards to his sleep issues.  Generally has been tolerating the medication well, has been assisting with sleep, denies any significant grogginess or fatigue the following day  Hypertension Blood pressure adequate in office today Is not checking at home Denies any issues with chest pain or headaches Will refill amlodipine today  Patient has been working with surgeon recently, did have reversal of colostomy, has been recovering well in regards to this, continues to follow-up with specialist  Plan for follow-up in about 3 to 4 months or sooner as needed   ___________________________________________ Marc Mcdonald de [place], MD, ABFM, CAQSM [facility] and [place]

## 2021-03-15 NOTE — Patient Instructions (Signed)
°  Medication Instructions:  Your physician recommends that you continue on your current medications as directed. Please refer to the Current Medication list given to you today. --If you need a refill on any your medications before your next appointment, please call your pharmacy first. If no refills are authorized on file call the office.-- Follow-Up: Your next appointment:   Your physician recommends that you schedule a follow-up appointment in: 3-4 MONTHS with Dr. de [place]  You will receive a text message or e-mail with a link to a survey about your care and experience with [place] today! We would greatly appreciate your feedback!   Thanks for letting us be apart of your health journey!!  Primary Care and Sports Medicine   Dr. Arlina Robes [place]   We encourage you to activate your patient portal called "MyChart".  Sign up information is provided on this After Visit Summary.  MyChart is used to connect with patients for Virtual Visits (Telemedicine).  Patients are able to view lab/test results, encounter notes, upcoming appointments, etc.  Non-urgent messages can be sent to your provider as well. To learn more about what you can do with MyChart, please visit --  [email or web address].

## 2021-05-03 NOTE — Assessment & Plan Note (Signed)
Patient requesting refill of trazodone today.  He reports that this has been helpful in regards to his sleep issues.  Generally has been tolerating the medication well, has been assisting with sleep, denies any significant grogginess or fatigue the following day ?

## 2021-05-03 NOTE — Assessment & Plan Note (Signed)
Blood pressure adequate in office today ?Is not checking at home ?Denies any issues with chest pain or headaches ?Will refill amlodipine today ?

## 2021-06-03 ENCOUNTER — Other Ambulatory Visit: Payer: Self-pay

## 2021-06-03 ENCOUNTER — Inpatient Hospital Stay: Payer: Medicare Other | Attending: Hematology

## 2021-06-03 ENCOUNTER — Ambulatory Visit (HOSPITAL_COMMUNITY)
Admission: RE | Admit: 2021-06-03 | Discharge: 2021-06-03 | Disposition: A | Discharge status: Home / Self Care | Payer: Medicare Other | Source: Ambulatory Visit | Attending: Hematology | Admitting: Hematology

## 2021-06-03 DIAGNOSIS — D649 Anemia, unspecified: Secondary | ICD-10-CM | POA: Insufficient documentation

## 2021-06-03 DIAGNOSIS — F1010 Alcohol abuse, uncomplicated: Secondary | ICD-10-CM | POA: Insufficient documentation

## 2021-06-03 DIAGNOSIS — C19 Malignant neoplasm of rectosigmoid junction: Secondary | ICD-10-CM | POA: Insufficient documentation

## 2021-06-03 DIAGNOSIS — I10 Essential (primary) hypertension: Secondary | ICD-10-CM | POA: Insufficient documentation

## 2021-06-03 LAB — CBC WITH DIFFERENTIAL (CANCER CENTER ONLY)
Abs Immature Granulocytes: 0.03 10*3/uL (ref 0.00–0.07)
Basophils Absolute: 0.1 10*3/uL (ref 0.0–0.1)
Basophils Relative: 1 %
Eosinophils Absolute: 0.2 10*3/uL (ref 0.0–0.5)
Eosinophils Relative: 3 %
HCT: 40.3 % (ref 39.0–52.0)
Hemoglobin: 13.5 g/dL (ref 13.0–17.0)
Immature Granulocytes: 0 %
Lymphocytes Relative: 23 %
Lymphs Abs: 1.8 10*3/uL (ref 0.7–4.0)
MCH: 30.3 pg (ref 26.0–34.0)
MCHC: 33.5 g/dL (ref 30.0–36.0)
MCV: 90.4 fL (ref 80.0–100.0)
Monocytes Absolute: 0.7 10*3/uL (ref 0.1–1.0)
Monocytes Relative: 9 %
Neutro Abs: 5.1 10*3/uL (ref 1.7–7.7)
Neutrophils Relative %: 64 %
Platelet Count: 272 10*3/uL (ref 150–400)
RBC: 4.46 MIL/uL (ref 4.22–5.81)
RDW: 15.2 % (ref 11.5–15.5)
WBC Count: 7.9 10*3/uL (ref 4.0–10.5)
nRBC: 0 % (ref 0.0–0.2)

## 2021-06-03 LAB — CMP (CANCER CENTER ONLY)
ALT: 11 U/L (ref 0–44)
AST: 25 U/L (ref 15–41)
Albumin: 4 g/dL (ref 3.5–5.0)
Alkaline Phosphatase: 89 U/L (ref 38–126)
Anion gap: 10 (ref 5–15)
BUN: 9 mg/dL (ref 8–23)
CO2: 27 mmol/L (ref 22–32)
Calcium: 9.1 mg/dL (ref 8.9–10.3)
Chloride: 102 mmol/L (ref 98–111)
Creatinine: 0.66 mg/dL (ref 0.61–1.24)
GFR, Estimated: >60 mL/min (ref >60)
Glucose, Bld: 101 mg/dL — ABNORMAL HIGH (ref 70–99)
Potassium: 4.2 mmol/L (ref 3.5–5.1)
Sodium: 139 mmol/L (ref 135–145)
Total Bilirubin: 0.8 mg/dL (ref 0.3–1.2)
Total Protein: 7.7 g/dL (ref 6.5–8.1)

## 2021-06-03 LAB — CEA (IN HOUSE-CHCC): CEA (CHCC-In House): 5.39 ng/mL — ABNORMAL HIGH (ref 0.00–5.00)

## 2021-06-03 IMAGING — CT CT CHEST-ABD-PELV W/ CM
2 of 5 series · 12 of 36 positions shown, 14 images · IV contrast (APPLIED)
Comparison: Multiple exams, including CT chest [DATE] and CT
abdomen [DATE]

CLINICAL DATA: Sigmoid colon cancer, status post resection
[DATE] and colostomy takedown [DATE].

* Tracking Code: BO *
EXAM:
CT CHEST, ABDOMEN, AND PELVIS WITH CONTRAST
TECHNIQUE: Multidetector CT imaging of the chest, abdomen and pelvis was
performed following the standard protocol during bolus
administration of intravenous contrast.

[Series 2: cap with · axial · 0.75mm/px · z∈[-576,-56]mm · 9 of 131 slices shown, 11 images]
[im 14/131  mediastinal]
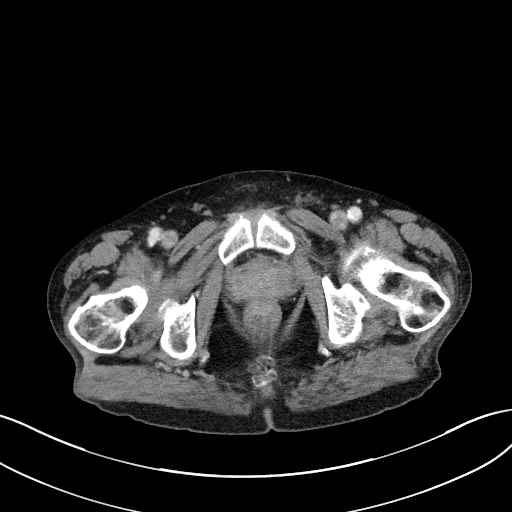
[im 14/131  bone]
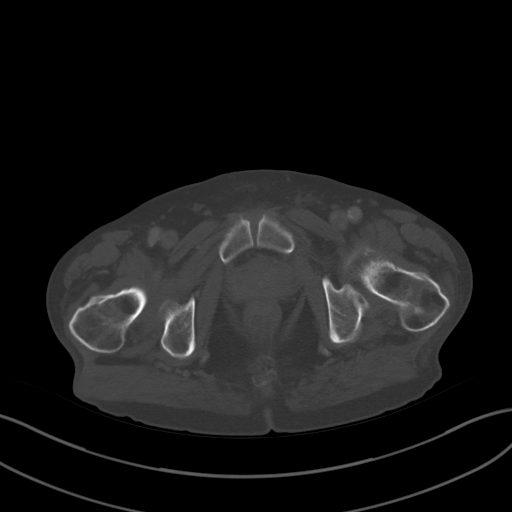
[im 27/131  mediastinal]
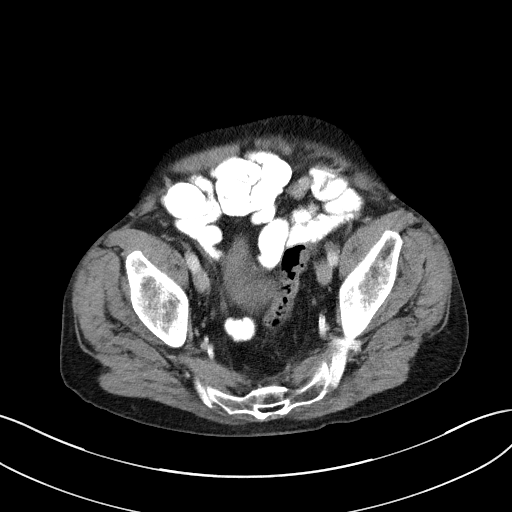
[im 40/131  mediastinal]
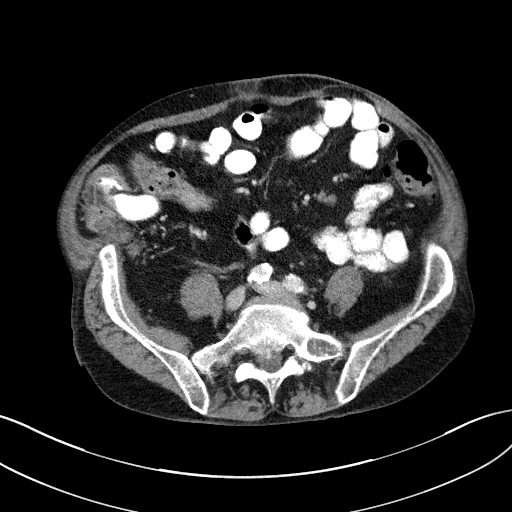
[im 53/131  mediastinal]
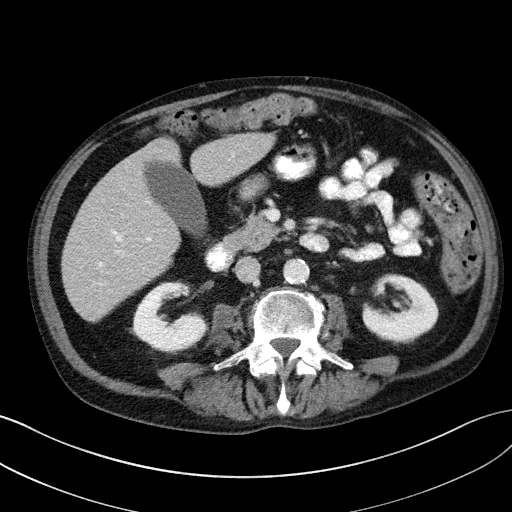
[im 66/131  mediastinal]
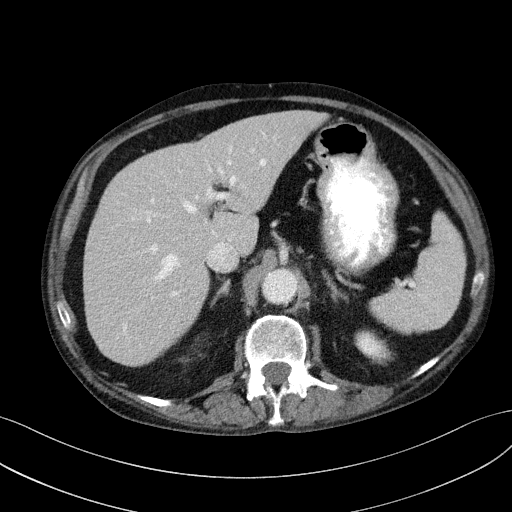
[im 79/131  mediastinal]
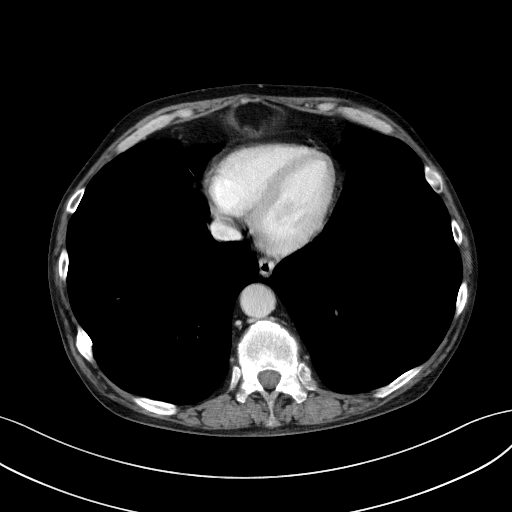
[im 92/131  mediastinal]
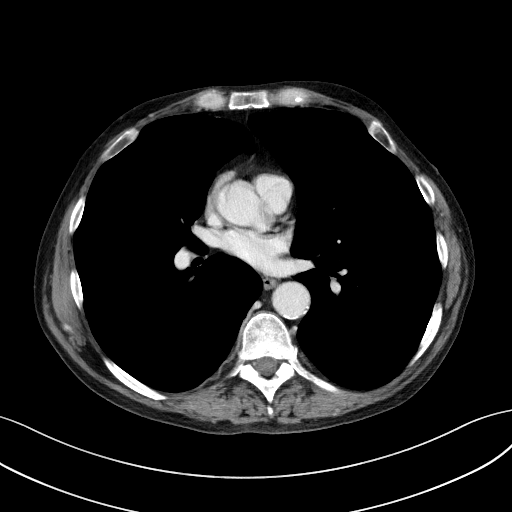
[im 105/131  mediastinal]
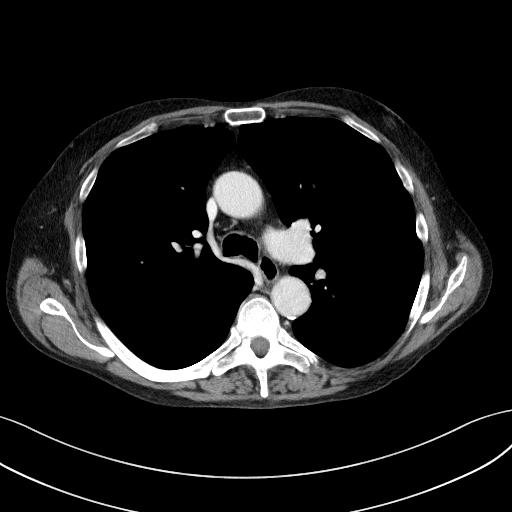
[im 118/131  mediastinal]
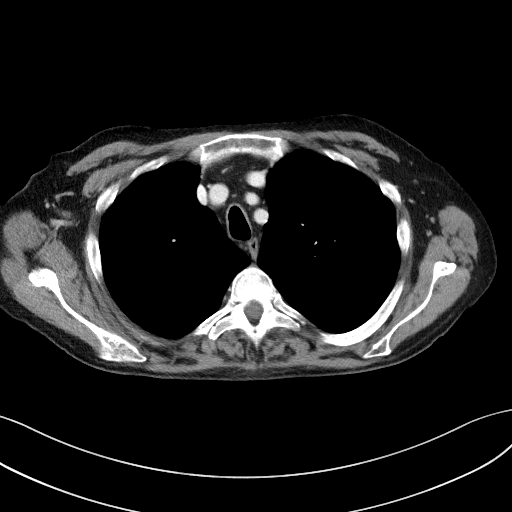
[im 118/131  bone]
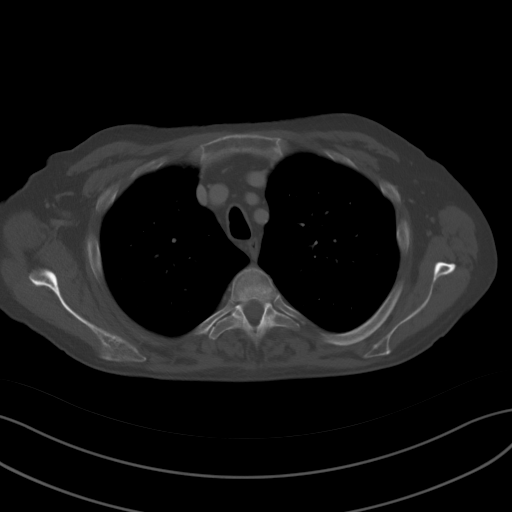

[Series 5: coronals · coronal · 0.68mm/px · 3 of 137 slices shown]
[im 28/137  mediastinal]
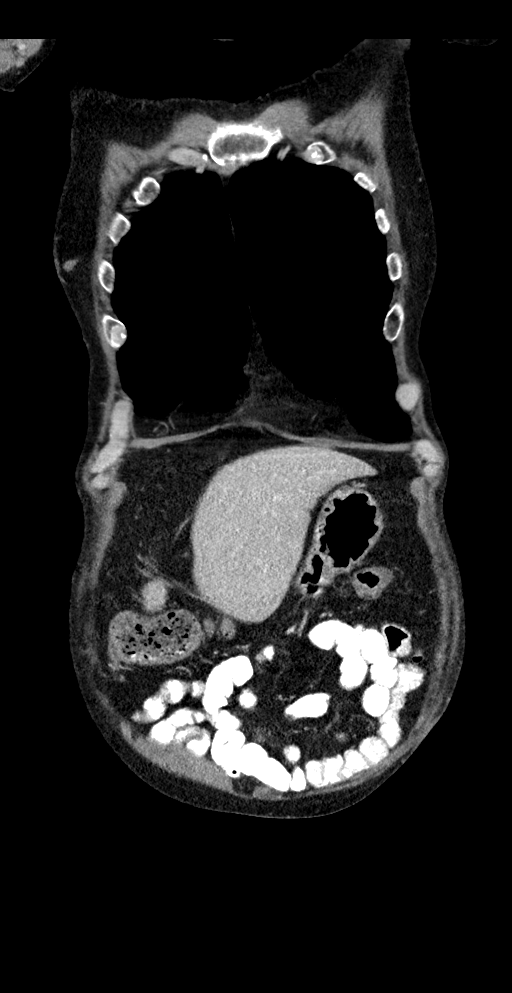
[im 55/137  mediastinal]
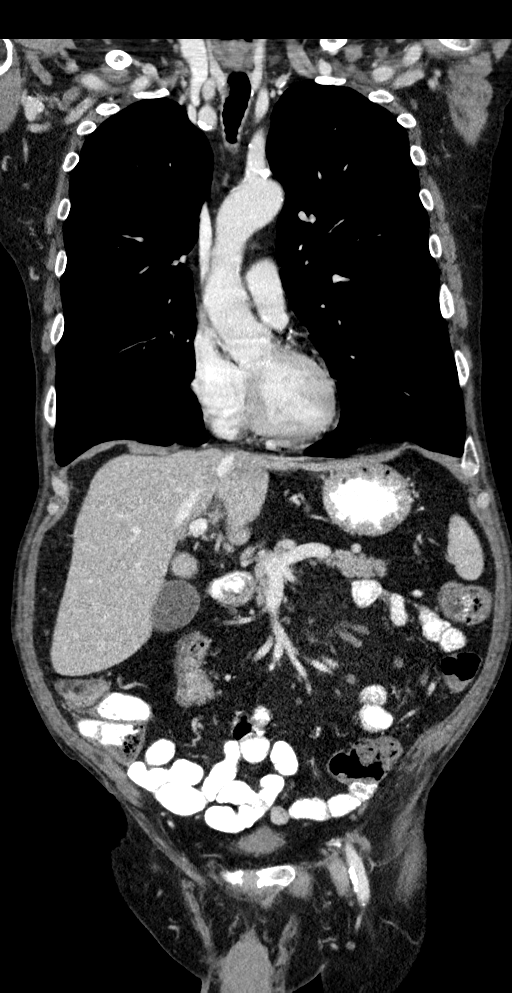
[im 82/137  mediastinal]
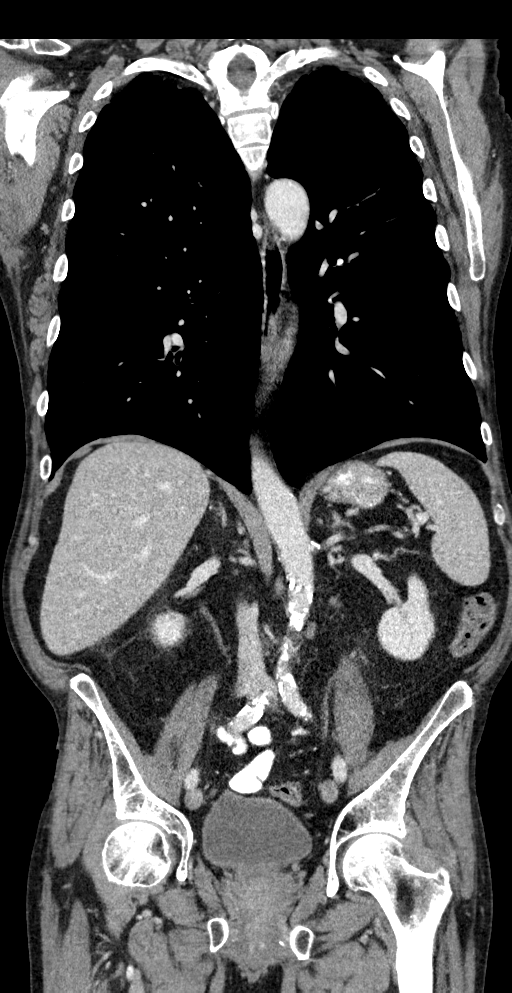

[12 of 36 positions shown; findings below may reference images not displayed]

RADIATION DOSE REDUCTION: This exam was performed according to the
departmental dose-optimization program which includes automated
exposure control, adjustment of the mA and/or kV according to
patient size and/or use of iterative reconstruction technique.

CONTRAST:  100mL OMNIPAQUE IOHEXOL 350 MG/ML SOLN
FINDINGS: CT CHEST FINDINGS

Cardiovascular: Coronary, aortic arch, and branch vessel
atherosclerotic vascular disease.

Mediastinum/Nodes: Unremarkable

Lungs/Pleura: Biapical pleuroparenchymal scarring. Emphysema. Airway
thickening is present, suggesting bronchitis or reactive airways
disease. Mild scattered scarring in the lungs.

Musculoskeletal: Old bilateral rib deformities related to remote
fractures.

CT ABDOMEN PELVIS FINDINGS

Hepatobiliary: Subtle nonspecific hypodensity posteriorly in the
lateral segment left hepatic lobe measuring about 1.1 by 2.3 by
cm as on image 49 series 5 and image 68 series 2. This could be from
mild fatty infiltration but is technically nonspecific.

Small depending gallstones in the gallbladder.

Pancreas: Unremarkable

Spleen: Unremarkable

Adrenals/Urinary Tract: 0.6 by 0.4 cm hypodense lesion anteriorly in
the right kidney lower pole on image 80 series 2. This is most
likely a tiny cyst but technically too small to characterize. In
general, such lesions are not felt to warrant further workup unless
there is a specific renal clinical concern. Adrenal glands
unremarkable.

Stomach/Bowel: Anastomotic staple line in the rectum. Otherwise
unremarkable.

Vascular/Lymphatic: Atherosclerosis is present, including aortoiliac
atherosclerotic disease. Substantial atheromatous narrowing of the
left external iliac artery on image 111 series 2. Small
retroperitoneal lymph nodes are not pathologically enlarged.

Reproductive: Unremarkable

Other: No supplemental non-categorized findings.

Musculoskeletal: Thinning of the left rectus abdominus muscle on
image 91 of series 2 with focal mild bulge containing primarily
adipose tissue a margin of small bowel, probably representing the
previous stoma site.

Levoconvex lumbar scoliosis. Mild lumbar spondylosis and
degenerative disc disease.
IMPRESSION: 1. Subtle hypodensity posteriorly in the lateral segment left
hepatic lobe could be from fatty infiltration but is technically
nonspecific; given the patient's history of colon cancer, I would
recommend definitive characterization using hepatic protocol MRI
with and without contrast.
2. Other imaging findings of potential clinical significance: Aortic
Atherosclerosis ([S7]-[S7]) and Emphysema ([S7]-[S7]). Coronary
atherosclerosis. Airway thickening is present, suggesting bronchitis
or reactive airways disease. Cholelithiasis. Anastomotic staple line
along the rectum. Mild bulging of the left anterior abdominal wall
probably at the site of prior stoma. Levoconvex lumbar scoliosis
with mild spondylosis and degenerative disc disease.

## 2021-06-03 MED ORDER — IOHEXOL 300 MG/ML  SOLN
100.0000 mL | Freq: Once | INTRAMUSCULAR | Status: AC | PRN
  Administered 2021-06-03: 100 mL via INTRAVENOUS

## 2021-06-03 MED ORDER — SODIUM CHLORIDE (PF) 0.9 % IJ SOLN
INTRAMUSCULAR | Status: AC
  Filled 2021-06-03: qty 50

## 2021-06-03 MED ORDER — IOHEXOL 350 MG/ML SOLN: 100.0000 mL | Freq: Once | INTRAVENOUS | Status: DC | PRN

## 2021-06-06 ENCOUNTER — Other Ambulatory Visit: Payer: Self-pay

## 2021-06-06 ENCOUNTER — Inpatient Hospital Stay: Payer: Medicare Other | Admitting: Hematology

## 2021-06-06 ENCOUNTER — Encounter: Payer: Self-pay | Admitting: Hematology

## 2021-06-06 VITALS — BP 119/79 | HR 101 | Temp 98.1°F | Resp 18 | Ht 68.0 in | Wt 139.6 lb

## 2021-06-06 DIAGNOSIS — C19 Malignant neoplasm of rectosigmoid junction: Secondary | ICD-10-CM | POA: Diagnosis present

## 2021-06-06 DIAGNOSIS — D649 Anemia, unspecified: Secondary | ICD-10-CM | POA: Diagnosis not present

## 2021-06-06 DIAGNOSIS — I10 Essential (primary) hypertension: Secondary | ICD-10-CM | POA: Diagnosis not present

## 2021-06-06 DIAGNOSIS — K769 Liver disease, unspecified: Secondary | ICD-10-CM | POA: Diagnosis not present

## 2021-06-06 DIAGNOSIS — F1010 Alcohol abuse, uncomplicated: Secondary | ICD-10-CM | POA: Diagnosis not present

## 2021-06-06 NOTE — Progress Notes (Signed)
?[place]   ?Telephone:(336) [phone] [phone]   ?Clinic Follow up Note  ? ?Patient Care Team: ?[place], Blondell Reveal, MD as PCP - General (Family Medicine) ?Truitt Merle, MD as Consulting Physician (Oncology) ?Jonnie Finner, RN (Inactive) as Oncology Nurse Navigator ? ?Date of Service:  06/06/2021 ? ?CHIEF COMPLAINT: f/u of colorectal cancer ? ?CURRENT THERAPY:  ?Surveillance ? ?ASSESSMENT & PLAN:  ?Marc Mcdonald is a 66 y.o. male with  ? ?1. Sigmoid colon cancer, pT3N0M0, stage II, MSS  ?-initially presented with anemia and heme-positive stool. His 05/26/20 upper endoscopy/colonoscopy showed at least intramucosal adenocarcinoma of rectosigmoid mass.  ?-He underwent colorectal surgery on 05/30/20 by Dr Dema Severin with colostomy. Surgical path showed 4.6cm of invasive adenocarcinoma that invades through the muscularis propria. Clear margins and LN negative. NO high risk features ?-CT Chest from 07/02/20 showed no evidence of metastasis. ?-adjuvant chemo not highly recommended ?-despite 20% risk of recurrence, he declines close surveillance. He agrees to annual surveillance scan. ?-s/p colostomy takedown and hernia repair on 02/13/21 with Dr. Dema Severin. Pathology showed only acute inflammation. ?-restaging CT CAP on 06/03/21 showed an indeterminate hypodensity in left liver. I reviewed the results with them today. The recommendation is for further evaluation with MRI. ?-labs from that day reviewed, his CEA has risen some to 5.39. He was previously a heavy smoker and quit 3 years ago, but his prior CEA in 12/2020 was WNL at 4.49. ?-he has also developed another abdominal hernia and is scheduled to meet Dr. Thermon Leyland on 06/20/21. If the liver lesion appears suspicious, we could coordinate a biopsy during the time of his procedure. ?-I will call them with the results of the MRI. They prefer I call his daughter, as she will understand the scan results better. ?  ?2. Hypertension  ?-his BP has been  persistently high since his colon surgery 08/2020, possible related to ostomy related pain  ?-well managed on amlodipine, now managed by his PCP. ?  ?3. Comorbidities: COPD, Duodenal ulcer, Alcohol abuse, history of heavy smoking. ?-f/u with PCP  ?-he has wheezing on exam today. He stopped smoking about 3 years ago. ?  ?  ?PLAN:  ?-MRI to be done in next 2-3 weeks ?-f/u phone call several days later ?-I will also call his daughter during our conversation ?-lab and f/u in 4 months ? ? ?No problem-specific Assessment & Plan notes found for this encounter. ? ? ?SUMMARY OF ONCOLOGIC HISTORY: ?Oncology History Overview Note  ?Cancer Staging ?sigmoid colon cancer ?Staging form: Colon and Rectum, AJCC 8th Edition ?- Pathologic stage from 05/30/2020: Stage IIA (pT3, pN0, cM0) - Signed by Truitt Merle, MD on 06/04/2020 ?Stage prefix: Initial diagnosis ?Total positive nodes: 0 ?Histologic grading system: 4 grade system ?Histologic grade (G): G2 ?Residual tumor (R): R0 - None ? ?  ?sigmoid colon cancer  ?05/26/2020 Procedure  ? Upper Endoscopy by Dr Bryan Lemma  ?IMPRESSION ?- Benign-appearing esophageal stenosis. Dilated. ?- LA Grade B reflux esophagitis with no bleeding. ?- 5 cm hiatal hernia. ?- Gastritis. Biopsied. ?- Erythematous duodenopathy. Biopsied. ?- Non-bleeding duodenal ulcer with no stigmata of bleeding. ?- Normal second portion of the duodenum. ?  ?05/26/2020 Procedure  ? Colonoscopy by Dr Bryan Lemma  ?IMPRESSION ?Hemorrhoids found on perianal exam. ?- Malignant partially obstructing tumor in the proximal rectum. Biopsied. Tattooed. ?- Non-bleeding internal hemorrhoids. ?- The examined portion of the ileum was normal. ?  ?05/26/2020 Initial Biopsy  ? FINAL MICROSCOPIC DIAGNOSIS:  ? ?A. DUODENUM, BIOPSY:  ?-  Gastric heterotopia.  ?- Warthin-Starry stain is negative for Helicobacter pylori.  ? ?B. STOMACH, BIOPSY:  ?- Mild chronic gastritis.  ?- Warthin-Starry stain is negative for Helicobacter pylori.  ? ?C. RECTAL MASS,  BIOPSY:  ?- At least intramucosal adenocarcinoma, see comment.  ? ?COMMENT:  ? ?C. Depth of invasion cannot be accurately assessed due to the  ?superficial nature of the biopsy.  ? ?Dr. Vic Ripper reviewed. ?  ?05/27/2020 Imaging  ? MRI Pelvis  ?IMPRESSION: ?Findings of T3 B, N0, Mx colorectal cancer. Though tumor is more ?compatible with a sigmoid rather than rectal cancer based on ?position of tumor as compared to the anterior peritoneal reflection. ?Note that the exam shows limited assessment with respect to anatomic ?boundaries due to motion artifact. ?  ?05/30/2020 Cancer Staging  ? Staging form: Colon and Rectum, AJCC 8th Edition ?- Pathologic stage from 05/30/2020: Stage IIA (pT3, pN0, cM0) - Signed by Truitt Merle, MD on 06/04/2020 ?Stage prefix: Initial diagnosis ?Total positive nodes: 0 ?Histologic grading system: 4 grade system ?Histologic grade (G): G2 ?Residual tumor (R): R0 - None ? ?  ?05/30/2020 Surgery  ? XI ROBOTIC ASSISTED LOWER ANTERIOR RESECTION WITH END COLOSTOMY CREATION and COLOSTOMY by Dr Dema Severin  ?  ?05/30/2020 Pathology Results  ? FINAL MICROSCOPIC DIAGNOSIS:  ? ?A. RECTOSIGMOID COLON, LOW ANTERIOR RESECTION:  ?- Invasive colonic adenocarcinoma, 4.6 cm.  ?- Tumor invades through the muscularis propria into pericolonic tissues.  ?- Margins of resection are not involved.  ?- Twenty-three lymph nodes, negative for carcinoma (0/23).  ?- See oncology table.  ? ? ?Mismatch Repair Protein (IHC)  ? ?SUMMARY INTERPRETATION: NORMAL  ? ?IHC EXPRESSION RESULTS  ?TEST           RESULT  ?MLH1:          Preserved nuclear expression  ?MSH2:          Preserved nuclear expression  ?MSH6:          Preserved nuclear expression  ?PMS2:          Preserved nuclear expression  ? ?  ?06/03/2020 Initial Diagnosis  ? sigmoid colon cancer ?  ?06/04/2020 Imaging  ? CT AP ?IMPRESSION: ?1. Status post interval rectosigmoid colon resection with left lower ?quadrant end ostomy and surgical drain in the low pelvis. ?2. The stomach and small  bowel are mildly distended and fluid-filled ?throughout, largest loops measuring up to 3.8 cm. Findings are ?consistent with postoperative ileus. ?3. No evidence of postoperative fluid collection or other ?complication. ?4. New atelectasis or consolidation of the right lung base. New ?trace left pleural effusion. ?  ?Aortic Atherosclerosis (ICD10-I70.0). ?  ?  ?06/11/2020 Imaging  ? CT AP  ?IMPRESSION: ?1. Small-bowel obstruction with transition zone in the mid ileum. ?Previously placed drain has been removed. No evidence of ?intra-abdominal abscess. ?2. Atelectasis at the right lung base. Small amount of pleural fluid ?on the right. ?3. Aortic atherosclerosis. ?  ?Aortic Atherosclerosis (ICD10-I70.0). ?  ?06/03/2021 Imaging  ? EXAM: ?CT CHEST, ABDOMEN, AND PELVIS WITH CONTRAST ? ?IMPRESSION: ?1. Subtle hypodensity posteriorly in the lateral segment left ?hepatic lobe could be from fatty infiltration but is technically ?nonspecific; given the patient's history of colon cancer, I would ?recommend definitive characterization using hepatic protocol MRI ?with and without contrast. ?2. Other imaging findings of potential clinical significance: Aortic ?Atherosclerosis (ICD10-I70.0) and Emphysema (ICD10-J43.9). Coronary atherosclerosis. Airway thickening is present, suggesting bronchitis or reactive airways disease. Cholelithiasis. Anastomotic staple line along the rectum. Mild bulging of the  left anterior abdominal wall probably at the site of prior stoma. Levoconvex lumbar scoliosis with mild spondylosis and degenerative disc disease. ?  ? ? ? ?INTERVAL HISTORY:  ?Marc Mcdonald is here for a follow up of colorectal cancer. He was last seen by me on 01/03/21. He presents to the clinic accompanied by his son. ?He tells me he developed another hernia and will see a surgeon on 4/27. He denies pain aside from that from the hernia. He reports his bowel movements fluctuate between going 4-5 times a day to none at all; he  describes his BM are soft, not watery. He adds he is satisfied with how things are now. ?He also reports a lump on his neck, bilaterally but more so on the left. He notes it started after he broke his rib(s). ?  ?All othe

## 2021-06-07 ENCOUNTER — Telehealth: Payer: Self-pay | Admitting: Hematology

## 2021-06-07 ENCOUNTER — Other Ambulatory Visit: Payer: Self-pay | Admitting: Hematology

## 2021-06-07 MED ORDER — LORAZEPAM 1 MG PO TABS: 1.0000 mg | ORAL_TABLET | Freq: Once | ORAL | 0 refills | Status: AC

## 2021-06-07 NOTE — Telephone Encounter (Signed)
Scheduled follow-up appointment per 4/13 los. Patient is aware and expressed concerns regarding the scan. Patient stated he was claustrophobic and requested Valium. Sent message to provider. ?

## 2021-06-20 ENCOUNTER — Other Ambulatory Visit: Payer: Self-pay | Admitting: Hematology

## 2021-06-20 ENCOUNTER — Telehealth: Payer: Self-pay

## 2021-06-20 ENCOUNTER — Other Ambulatory Visit: Payer: Self-pay

## 2021-06-20 MED ORDER — LORAZEPAM 1 MG PO TABS: 1.0000 mg | ORAL_TABLET | Freq: Two times a day (BID) | ORAL | 0 refills | Status: DC | PRN

## 2021-06-20 NOTE — Telephone Encounter (Signed)
Pt called stating he has a MRI scheduled on 06/27/2021 and would like to have something for claustrophobia.  Pt would like Ativan (Lorazepam) to take prior to his MRI on 06/27/2021.  Sent Patient Call message to Dr. Burr Medico regarding pt's request. ?

## 2021-06-27 ENCOUNTER — Telehealth: Payer: Self-pay

## 2021-06-27 ENCOUNTER — Ambulatory Visit (HOSPITAL_COMMUNITY)
Admission: RE | Admit: 2021-06-27 | Discharge: 2021-06-27 | Disposition: A | Discharge status: Home / Self Care | Payer: Medicare Other | Source: Ambulatory Visit | Attending: Hematology | Admitting: Hematology

## 2021-06-27 DIAGNOSIS — K769 Liver disease, unspecified: Secondary | ICD-10-CM | POA: Diagnosis not present

## 2021-06-27 IMAGING — MR MR ABDOMEN WO/W CM
20 series · 48 of 48 positions shown · IV contrast (gadavist)
Comparison: CT chest abdomen pelvis [DATE]

CLINICAL DATA: History of colon cancer further evaluation of
hepatic lesions seen on prior CT

EXAM:
MRI ABDOMEN WITHOUT AND WITH CONTRAST
TECHNIQUE: Multiplanar multisequence MR imaging of the abdomen was performed
both before and after the administration of intravenous contrast.
CONTRAST:  6mL GADAVIST GADOBUTROL 1 MMOL/ML IV SOLN

[Series 2: DWI · axial · 6.0mm · 1.49mm/px · 1 of 72 slices shown (1 of 2)]
[im 1/72]
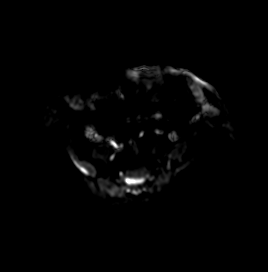

[Series 3: DWI · axial · 6.0mm · 1.49mm/px · 1 of 36 slices shown (2 of 2)]
[im 1/36]
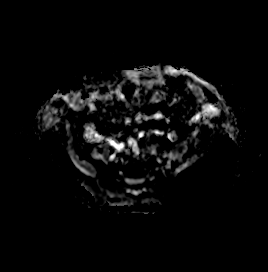

[Series 5: T2 fat-sat · axial · 6.0mm · 1.25mm/px · 1 of 36 slices shown]
[im 1/36]
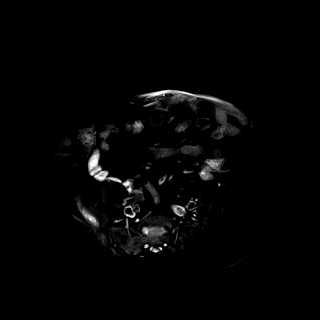

[Series 7: T2 · coronal · 6.0mm · 1.56mm/px · 1 of 36 slices shown (1 of 2)]
[im 1/36]
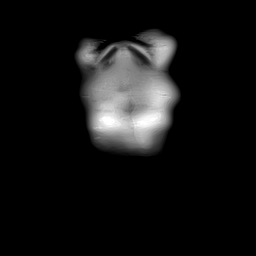

[Series 8: T1 · axial · 3.0mm · 1.25mm/px · z∈[-201,+60]mm · 3 of 88 slices shown (1 of 2)]
[im 1/88]
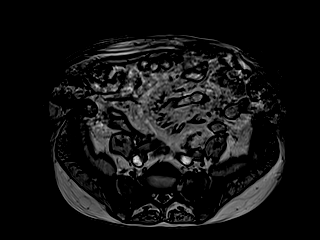
[im 44/88]
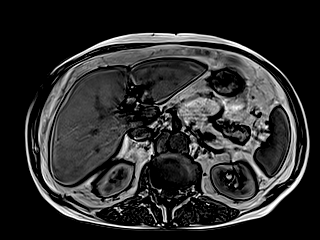
[im 88/88]
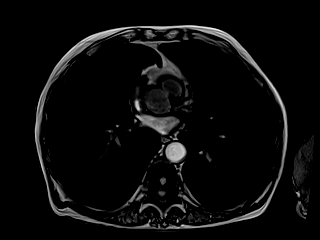

[Series 9: T1 · axial · 3.0mm · 1.25mm/px · z∈[-201,+60]mm · 3 of 88 slices shown (2 of 2)]
[im 1/88]
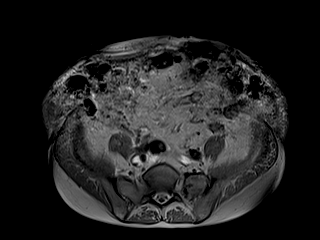
[im 44/88]
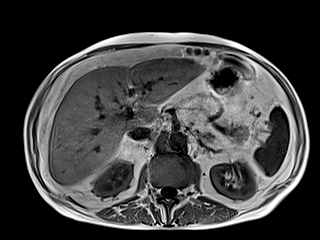
[im 88/88]
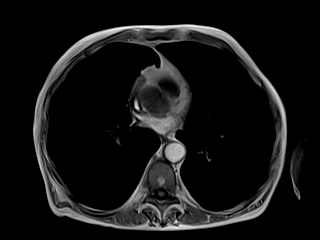

[Series 10: bSSFP · axial · 4.0mm · 0.84mm/px · z∈[-205,+71]mm · 2 of 70 slices shown]
[im 1/70]
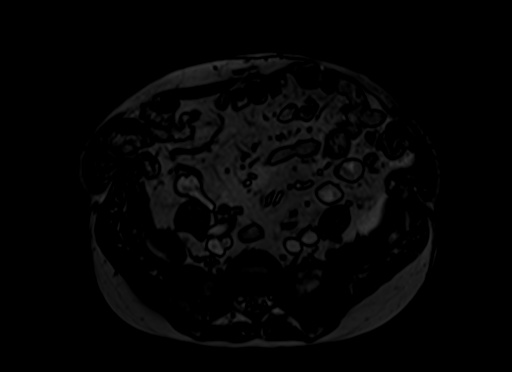
[im 70/70]
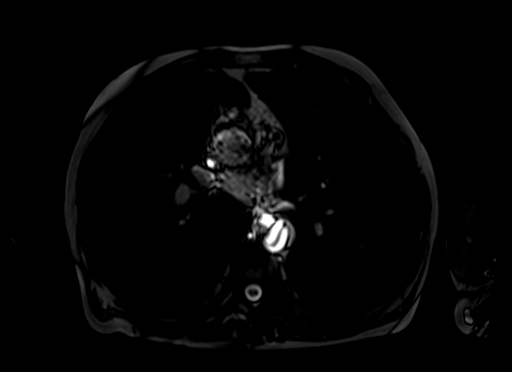

[Series 12: T1 dynamic · axial · 3.0mm · 1.25mm/px · z∈[-228,+57]mm · 3 of 96 slices shown (1 of 12)]
[im 1/96]
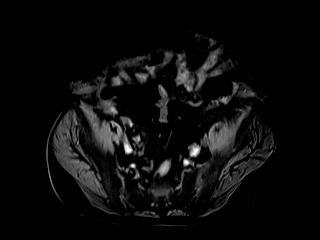
[im 48/96]
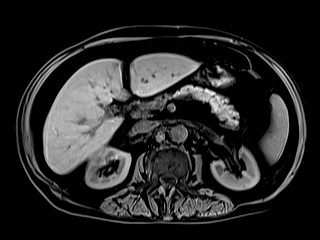
[im 96/96]
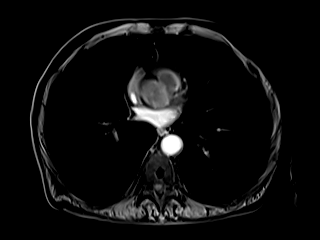

[Series 16: T1 dynamic · axial · 3.0mm · 1.25mm/px · z∈[-228,+57]mm · 3 of 96 slices shown (2 of 12)]
[im 1/96]
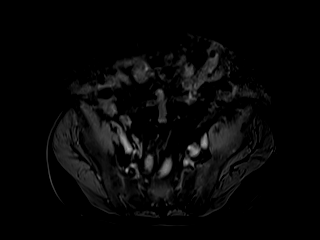
[im 48/96]
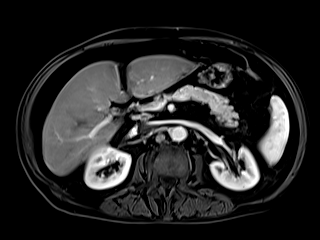
[im 96/96]
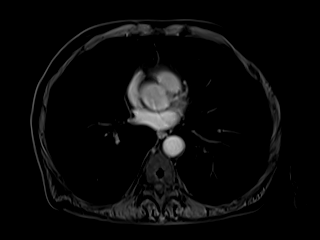

[Series 17: T1 dynamic · axial · 3.0mm · 1.25mm/px · z∈[-228,+57]mm · 3 of 96 slices shown (3 of 12)]
[im 1/96]
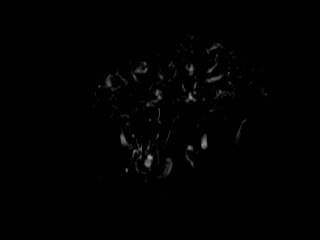
[im 48/96]
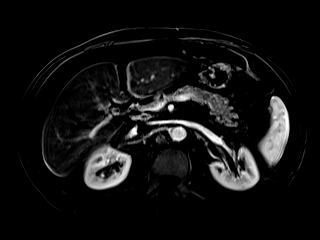
[im 96/96]
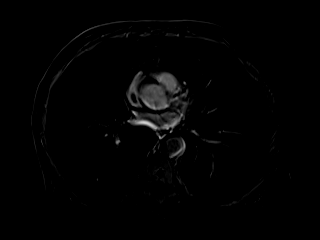

[Series 20: T1 dynamic · axial · 3.0mm · 1.25mm/px · z∈[-228,+57]mm · 3 of 96 slices shown (4 of 12)]
[im 1/96]
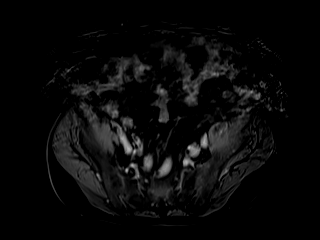
[im 48/96]
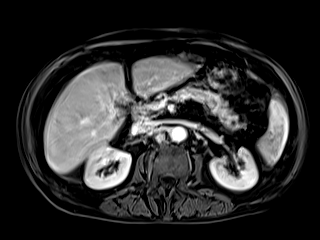
[im 96/96]
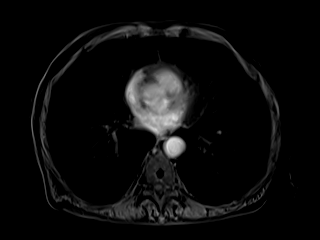

[Series 21: T1 dynamic · axial · 3.0mm · 1.25mm/px · z∈[-228,+57]mm · 3 of 96 slices shown (5 of 12)]
[im 1/96]
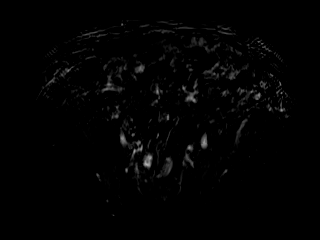
[im 48/96]
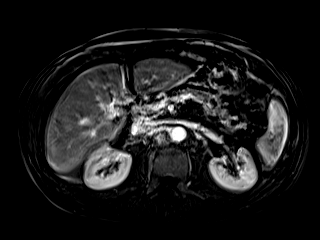
[im 96/96]
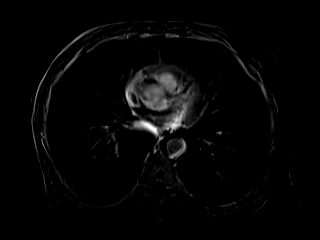

[Series 24: T1 dynamic · axial · 3.0mm · 1.25mm/px · z∈[-228,+57]mm · 3 of 96 slices shown (6 of 12)]
[im 1/96]
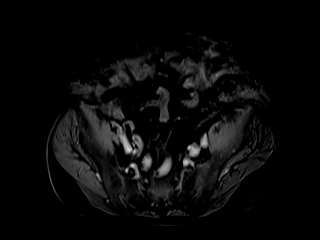
[im 48/96]
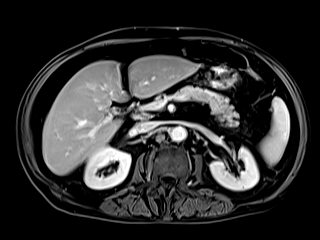
[im 96/96]
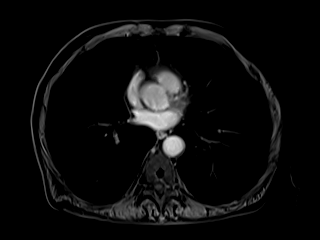

[Series 25: T1 dynamic · axial · 3.0mm · 1.25mm/px · z∈[-228,+57]mm · 3 of 96 slices shown (7 of 12)]
[im 1/96]
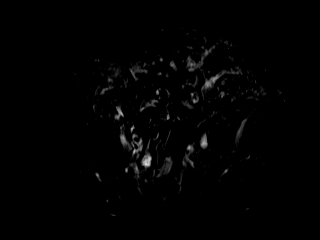
[im 48/96]
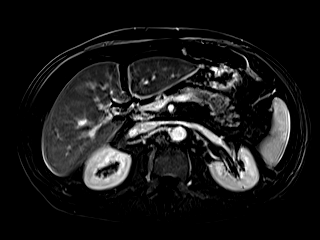
[im 96/96]
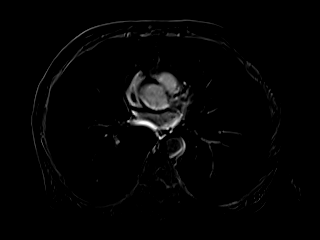

[Series 27: T1 dynamic · coronal · 5.0mm · 1.41mm/px · 2 of 56 slices shown (8 of 12)]
[im 1/56]
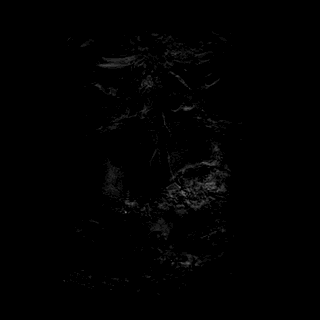
[im 56/56]
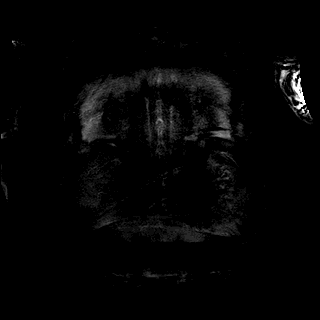

[Series 28: T2 · axial · 6.0mm · 1.56mm/px · 1 of 42 slices shown (2 of 2)]
[im 1/42]
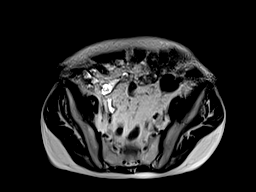

[Series 31: T1 dynamic · axial · 3.0mm · 1.25mm/px · z∈[-228,+57]mm · 3 of 96 slices shown (9 of 12)]
[im 1/96]
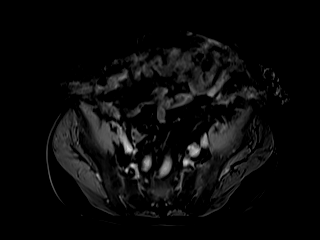
[im 48/96]
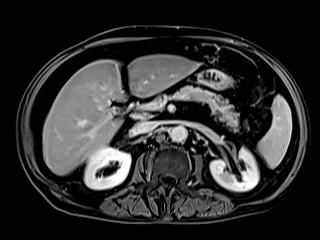
[im 96/96]
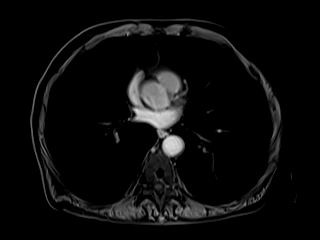

[Series 32: T1 dynamic · axial · 3.0mm · 1.25mm/px · z∈[-228,+57]mm · 3 of 96 slices shown (10 of 12)]
[im 1/96]
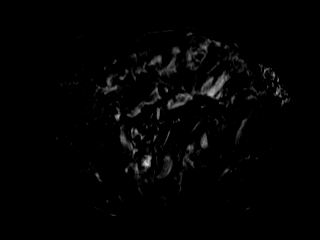
[im 48/96]
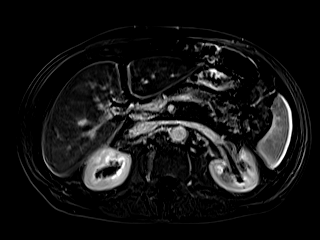
[im 96/96]
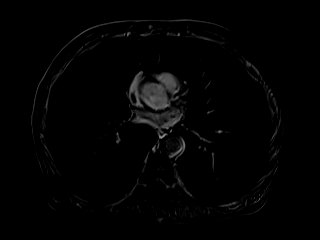

[Series 35: T1 dynamic · axial · 3.0mm · 1.25mm/px · z∈[-228,+57]mm · 3 of 96 slices shown (11 of 12)]
[im 1/96]
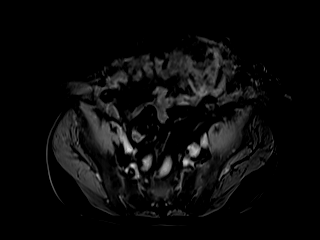
[im 48/96]
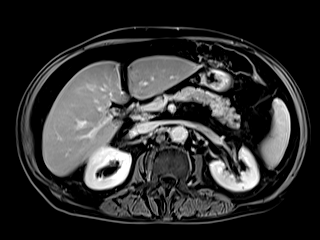
[im 96/96]
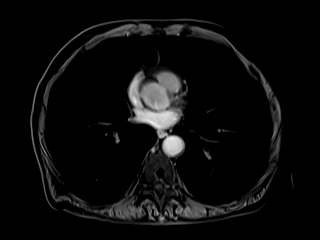

[Series 36: T1 dynamic · axial · 3.0mm · 1.25mm/px · z∈[-228,+57]mm · 3 of 96 slices shown (12 of 12)]
[im 1/96]
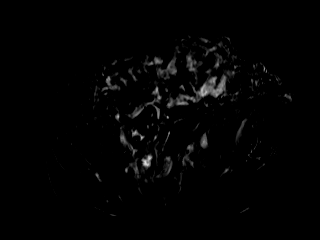
[im 48/96]
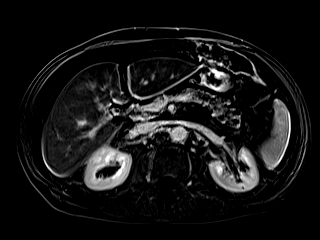
[im 96/96]
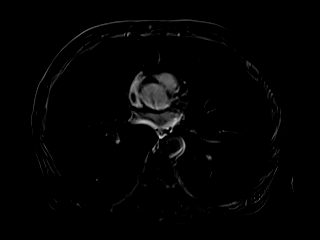

[48 of 48 positions shown; findings below may reference images not displayed]

FINDINGS: Lower chest: No acute abnormality.

Hepatobiliary: Focus of signal loss on out of phase imaging the
posterior left lobe of the liver on image 42/8, without abnormality
on other pulse sequences consistent with focal fatty infiltration
and corresponding with the abnormality seen on prior CT. No
suspicious hepatic lesion. Gallbladder is unremarkable. No biliary
ductal dilation.

Pancreas: No pancreatic ductal dilation or evidence of acute
inflammation.

Spleen:  No splenomegaly or focal splenic lesion.

Adrenals/Urinary Tract: Bilateral adrenal glands appear normal.
Millimetric bilateral fluid intensity renal lesions are most
consistent with renal cysts which in the absence of clinically
indicated signs/symptoms require no independent follow-up. No solid
enhancing renal mass.

Stomach/Bowel: Colonic diverticulosis.  No acute abnormality.

Vascular/Lymphatic: Normal caliber abdominal aorta. The portal,
splenic and superior mesenteric veins are patent. No pathologically
enlarged abdominal lymph nodes.

Other:  No significant abdominopelvic free fluid.

Musculoskeletal: No suspicious bone lesions identified.
IMPRESSION: Focus of signal loss on out of phase imaging the posterior left lobe
of the liver consistent with focal fatty infiltration and
corresponding with the abnormality seen on prior CT. No suspicious
hepatic lesion.

## 2021-06-27 MED ORDER — GADOBUTROL 1 MMOL/ML IV SOLN
6.0000 mL | Freq: Once | INTRAVENOUS | Status: AC | PRN
  Administered 2021-06-27: 6 mL via INTRAVENOUS

## 2021-06-27 NOTE — Telephone Encounter (Signed)
Pt LVM stating he completed his MRI today 06/27/2021 and was upset and disrespectful to Dr. Burr Medico in his voicemail.  Pt stated he completed his MRI today and Dr. Burr Medico was supposed to call his daughter or son with the MRI results.  Pt stated he does not want Dr. Ernestina Penna services anymore.  Notified Dr. Burr Medico of pt's call.     ?

## 2021-07-01 ENCOUNTER — Inpatient Hospital Stay: Payer: Medicare Other | Admitting: Hematology

## 2021-07-01 ENCOUNTER — Other Ambulatory Visit: Payer: Self-pay

## 2021-07-05 ENCOUNTER — Ambulatory Visit: Payer: Self-pay | Admitting: Surgery

## 2021-07-11 ENCOUNTER — Other Ambulatory Visit (HOSPITAL_BASED_OUTPATIENT_CLINIC_OR_DEPARTMENT_OTHER): Payer: Self-pay | Admitting: Family Medicine

## 2021-07-11 NOTE — Progress Notes (Addendum)
COVID Vaccine Completed: yes x1  Date of COVID positive in last 90 days: no  PCP - Marc de [place], MD Cardiologist - n/a  Chest x-ray - CT 06/03/21 Epic EKG - 07/12/21 Epic/chart Stress Test - n/a ECHO - 05/27/20 Epic Cardiac Cath - n/a Pacemaker/ICD device last checked: n/a Spinal Cord Stimulator: n/a  Bowel Prep - no  Sleep Study - n/a CPAP -   Fasting Blood Sugar - n/a Checks Blood Sugar _____ times a day  Blood Thinner Instructions:  n/a Aspirin Instructions: Last Dose:  Activity level: Can go up a flight of stairs and perform activities of daily living without stopping and without symptoms of chest pain or shortness of breath.    Anesthesia review: aortic atherosclerosis, HTN, COPD, esophageal stricture, colon cancer  Patient denies shortness of breath, fever, cough and chest pain at PAT appointment   Patient verbalized understanding of instructions that were given to them at the PAT appointment. Patient was also instructed that they will need to review over the PAT instructions again at home before surgery.

## 2021-07-11 NOTE — Patient Instructions (Addendum)
DUE TO COVID-19 ONLY TWO VISITORS  (aged 67 and older)  ARE ALLOWED TO COME WITH YOU AND STAY IN THE WAITING ROOM ONLY DURING PRE OP AND PROCEDURE.   **NO VISITORS ARE ALLOWED IN THE SHORT STAY AREA OR RECOVERY ROOM!!**  IF YOU WILL BE ADMITTED INTO THE HOSPITAL YOU ARE ALLOWED ONLY FOUR SUPPORT PEOPLE DURING VISITATION HOURS ONLY (7 AM -8PM)   The support person(s) must pass our screening, gel in and out, and wear a mask at all times, including in the patient's room. Patients must also wear a mask when staff or their support person are in the room. Visitors GUEST BADGE MUST BE WORN VISIBLY  One adult visitor may remain with you overnight and MUST be in the room by 8 P.M.     Your procedure is scheduled on: 07/15/21   Report to [facility] Main Entrance    Report to admitting at 5:15 AM   Call this number if you have problems the morning of surgery [phone]   Do not eat food :After Midnight.   After Midnight you may have the following liquids until 4:30 AM DAY OF SURGERY  Water Black Coffee (sugar ok, NO MILK/CREAM OR CREAMERS)  Tea (sugar ok, NO MILK/CREAM OR CREAMERS) regular and decaf                             Plain Jell-O (NO RED)                                           Fruit ices (not with fruit pulp, NO RED)                                     Popsicles (NO RED)                                                                  Juice: apple, WHITE grape, WHITE cranberry Sports drinks like Gatorade (NO RED) Clear broth(vegetable,chicken,beef)  FOLLOW BOWEL PREP AND ANY ADDITIONAL PRE OP INSTRUCTIONS YOU RECEIVED FROM YOUR SURGEON'S OFFICE!!!     Oral Hygiene is also important to reduce your risk of infection.                                    Remember - BRUSH YOUR TEETH THE MORNING OF SURGERY WITH YOUR REGULAR TOOTHPASTE   Take these medicines the morning of surgery with A SIP OF WATER: Tylenol, Amlodipine, Pantoprazole.                               You  may not have any metal on your body including jewelry, and body piercing             Do not wear lotions, powders, cologne, or deodorant              Men may shave face and neck.  Do not bring valuables to the hospital. [place].   Bring small overnight bag day of surgery.              Please read over the following fact sheets you were given: IF YOU HAVE QUESTIONS ABOUT YOUR PRE-OP INSTRUCTIONS PLEASE CALL [place] - Preparing for Surgery Before surgery, you can play an important role.  Because skin is not sterile, your skin needs to be as free of germs as possible.  You can reduce the number of germs on your skin by washing with CHG (chlorahexidine gluconate) soap before surgery.  CHG is an antiseptic cleaner which kills germs and bonds with the skin to continue killing germs even after washing. Please DO NOT use if you have an allergy to CHG or antibacterial soaps.  If your skin becomes reddened/irritated stop using the CHG and inform your nurse when you arrive at Short Stay. Do not shave (including legs and underarms) for at least 48 hours prior to the first CHG shower.  You may shave your face/neck.  Please follow these instructions carefully:  1.  Shower with CHG Soap the night before surgery and the  morning of surgery.  2.  If you choose to wash your hair, wash your hair first as usual with your normal  shampoo.  3.  After you shampoo, rinse your hair and body thoroughly to remove the shampoo.                             4.  Use CHG as you would any other liquid soap.  You can apply chg directly to the skin and wash.  Gently with a scrungie or clean washcloth.  5.  Apply the CHG Soap to your body ONLY FROM THE NECK DOWN.   Do   not use on face/ open                           Wound or open sores. Avoid contact with eyes, ears mouth and   genitals (private parts).                       Wash face,  Genitals  (private parts) with your normal soap.             6.  Wash thoroughly, paying special attention to the area where your    surgery  will be performed.  7.  Thoroughly rinse your body with warm water from the neck down.  8.  DO NOT shower/wash with your normal soap after using and rinsing off the CHG Soap.                9.  Pat yourself dry with a clean towel.            10.  Wear clean pajamas.            11.  Place clean sheets on your bed the night of your first shower and do not  sleep with pets. Day of Surgery : Do not apply any lotions/deodorants the morning of surgery.  Please wear clean clothes to the hospital/surgery center.  FAILURE TO FOLLOW THESE INSTRUCTIONS MAY RESULT IN THE CANCELLATION OF YOUR SURGERY  PATIENT SIGNATURE_________________________________  NURSE SIGNATURE__________________________________  ________________________________________________________________________

## 2021-07-12 ENCOUNTER — Encounter (HOSPITAL_COMMUNITY)
Admission: RE | Admit: 2021-07-12 | Discharge: 2021-07-12 | Disposition: A | Discharge status: Home / Self Care | Payer: Medicare Other | Source: Ambulatory Visit | Attending: Surgery | Admitting: Surgery

## 2021-07-12 ENCOUNTER — Encounter (HOSPITAL_COMMUNITY): Payer: Self-pay

## 2021-07-12 VITALS — BP 131/81 | HR 100 | Temp 97.8°F | Resp 14 | Ht 68.0 in | Wt 133.8 lb

## 2021-07-12 DIAGNOSIS — Z01818 Encounter for other preprocedural examination: Secondary | ICD-10-CM | POA: Diagnosis present

## 2021-07-12 DIAGNOSIS — Z789 Other specified health status: Secondary | ICD-10-CM | POA: Diagnosis not present

## 2021-07-12 DIAGNOSIS — I10 Essential (primary) hypertension: Secondary | ICD-10-CM | POA: Diagnosis not present

## 2021-07-12 HISTORY — DX: Unspecified osteoarthritis, unspecified site: M19.90

## 2021-07-12 LAB — COMPREHENSIVE METABOLIC PANEL
ALT: 20 U/L (ref 0–44)
AST: 48 U/L — ABNORMAL HIGH (ref 15–41)
Albumin: 4 g/dL (ref 3.5–5.0)
Alkaline Phosphatase: 83 U/L (ref 38–126)
Anion gap: 11 (ref 5–15)
BUN: 11 mg/dL (ref 8–23)
CO2: 25 mmol/L (ref 22–32)
Calcium: 9.2 mg/dL (ref 8.9–10.3)
Chloride: 105 mmol/L (ref 98–111)
Creatinine, Ser: 0.76 mg/dL (ref 0.61–1.24)
GFR, Estimated: >60 mL/min (ref >60)
Glucose, Bld: 110 mg/dL — ABNORMAL HIGH (ref 70–99)
Potassium: 4.2 mmol/L (ref 3.5–5.1)
Sodium: 141 mmol/L (ref 135–145)
Total Bilirubin: 1.4 mg/dL — ABNORMAL HIGH (ref 0.3–1.2)
Total Protein: 7.8 g/dL (ref 6.5–8.1)

## 2021-07-12 LAB — CBC
HCT: 43.7 % (ref 39.0–52.0)
Hemoglobin: 14.8 g/dL (ref 13.0–17.0)
MCH: 32.5 pg (ref 26.0–34.0)
MCHC: 33.9 g/dL (ref 30.0–36.0)
MCV: 96 fL (ref 80.0–100.0)
Platelets: 267 10*3/uL (ref 150–400)
RBC: 4.55 MIL/uL (ref 4.22–5.81)
RDW: 17.7 % — ABNORMAL HIGH (ref 11.5–15.5)
WBC: 6.6 10*3/uL (ref 4.0–10.5)
nRBC: 0 % (ref 0.0–0.2)

## 2021-07-14 NOTE — Anesthesia Preprocedure Evaluation (Addendum)
Anesthesia Evaluation  Patient identified by MRN, date of birth, ID band Patient awake    Reviewed: Allergy & Precautions, NPO status , Patient's Chart, lab work & pertinent test results  History of Anesthesia Complications Negative for: history of anesthetic complications  Airway Mallampati: II  TM Distance: >3 FB Neck ROM: Full    Dental  (+) Dental Advisory Given   Pulmonary COPD, Patient abstained from smoking., former smoker,    Pulmonary exam normal        Cardiovascular hypertension, Pt. on medications Normal cardiovascular exam   Echo 05/27/20: EF 65-70%, g1dd, hyperdynamic RVSF   Neuro/Psych negative neurological ROS     GI/Hepatic Neg liver ROS, PUD, GERD  ,  Endo/Other  negative endocrine ROS  Renal/GU negative Renal ROS  negative genitourinary   Musculoskeletal negative musculoskeletal ROS (+)   Abdominal   Peds  Hematology negative hematology ROS (+)   Anesthesia Other Findings   Reproductive/Obstetrics                           Anesthesia Physical Anesthesia Plan  ASA: 3  Anesthesia Plan: General   Post-op Pain Management: Tylenol PO (pre-op)* and Toradol IV (intra-op)*   Induction: Intravenous  PONV Risk Score and Plan: 2 and Ondansetron, Dexamethasone, Treatment may vary due to age or medical condition and Midazolam  Airway Management Planned: Oral ETT  Additional Equipment: None  Intra-op Plan:   Post-operative Plan: Extubation in OR  Informed Consent: I have reviewed the patients History and Physical, chart, labs and discussed the procedure including the risks, benefits and alternatives for the proposed anesthesia with the patient or authorized representative who has indicated his/her understanding and acceptance.     Dental advisory given  Plan Discussed with:   Anesthesia Plan Comments:        Anesthesia Quick Evaluation

## 2021-07-15 ENCOUNTER — Encounter (HOSPITAL_COMMUNITY): Payer: Self-pay | Admitting: Surgery

## 2021-07-15 ENCOUNTER — Ambulatory Visit (HOSPITAL_BASED_OUTPATIENT_CLINIC_OR_DEPARTMENT_OTHER): Payer: Medicare Other | Admitting: Anesthesiology

## 2021-07-15 ENCOUNTER — Other Ambulatory Visit: Payer: Self-pay

## 2021-07-15 ENCOUNTER — Ambulatory Visit (HOSPITAL_COMMUNITY): Payer: Medicare Other | Admitting: Physician Assistant

## 2021-07-15 ENCOUNTER — Encounter (HOSPITAL_COMMUNITY)
Admission: RE | Disposition: A | Discharge status: Home / Self Care | Payer: Self-pay | Source: Home / Self Care | Attending: Surgery

## 2021-07-15 ENCOUNTER — Observation Stay (HOSPITAL_COMMUNITY)
Admission: RE | Admit: 2021-07-15 | Discharge: 2021-07-16 | Disposition: A | Discharge status: Home / Self Care | Payer: Medicare Other | Attending: Surgery | Admitting: Surgery

## 2021-07-15 DIAGNOSIS — K429 Umbilical hernia without obstruction or gangrene: Secondary | ICD-10-CM | POA: Diagnosis not present

## 2021-07-15 DIAGNOSIS — K432 Incisional hernia without obstruction or gangrene: Secondary | ICD-10-CM | POA: Diagnosis present

## 2021-07-15 DIAGNOSIS — K4090 Unilateral inguinal hernia, without obstruction or gangrene, not specified as recurrent: Secondary | ICD-10-CM

## 2021-07-15 DIAGNOSIS — Z79899 Other long term (current) drug therapy: Secondary | ICD-10-CM | POA: Insufficient documentation

## 2021-07-15 DIAGNOSIS — K219 Gastro-esophageal reflux disease without esophagitis: Secondary | ICD-10-CM | POA: Diagnosis not present

## 2021-07-15 DIAGNOSIS — Z87891 Personal history of nicotine dependence: Secondary | ICD-10-CM | POA: Insufficient documentation

## 2021-07-15 DIAGNOSIS — Z85038 Personal history of other malignant neoplasm of large intestine: Secondary | ICD-10-CM | POA: Insufficient documentation

## 2021-07-15 DIAGNOSIS — I10 Essential (primary) hypertension: Secondary | ICD-10-CM | POA: Insufficient documentation

## 2021-07-15 DIAGNOSIS — J449 Chronic obstructive pulmonary disease, unspecified: Secondary | ICD-10-CM | POA: Diagnosis not present

## 2021-07-15 HISTORY — PX: XI ROBOTIC ASSISTED VENTRAL HERNIA: SHX6789

## 2021-07-15 SURGERY — REPAIR, HERNIA, VENTRAL, ROBOT-ASSISTED
Anesthesia: General

## 2021-07-15 MED ORDER — GABAPENTIN 600 MG PO TABS
300.0000 mg | ORAL_TABLET | Freq: Three times a day (TID) | ORAL | Status: DC
  Filled 2021-07-15: qty 0.5

## 2021-07-15 MED ORDER — MIDAZOLAM HCL 2 MG/2ML IJ SOLN
INTRAMUSCULAR | Status: AC
  Filled 2021-07-15: qty 2

## 2021-07-15 MED ORDER — DEXAMETHASONE SODIUM PHOSPHATE 10 MG/ML IJ SOLN
INTRAMUSCULAR | Status: AC
  Filled 2021-07-15: qty 1

## 2021-07-15 MED ORDER — LIDOCAINE HCL (PF) 2 % IJ SOLN
INTRAMUSCULAR | Status: AC
  Filled 2021-07-15: qty 5

## 2021-07-15 MED ORDER — AMLODIPINE BESYLATE 5 MG PO TABS
5.0000 mg | ORAL_TABLET | Freq: Every day | ORAL | Status: DC
Start: 2021-07-16 — End: 2021-07-16
  Administered 2021-07-16: 5 mg via ORAL
  Filled 2021-07-15: qty 1

## 2021-07-15 MED ORDER — BUPIVACAINE LIPOSOME 1.3 % IJ SUSP
INTRAMUSCULAR | Status: AC
  Filled 2021-07-15: qty 20

## 2021-07-15 MED ORDER — LACTATED RINGERS IV SOLN: INTRAVENOUS | Status: DC

## 2021-07-15 MED ORDER — DOCUSATE SODIUM 100 MG PO CAPS
100.0000 mg | ORAL_CAPSULE | Freq: Two times a day (BID) | ORAL | Status: DC
Start: 2021-07-15 — End: 2021-07-16
  Administered 2021-07-15 – 2021-07-16 (×2): 100 mg via ORAL
  Filled 2021-07-15 (×2): qty 1

## 2021-07-15 MED ORDER — SIMETHICONE 80 MG PO CHEW
80.0000 mg | CHEWABLE_TABLET | Freq: Four times a day (QID) | ORAL | Status: DC | PRN
Start: 2021-07-15 — End: 2021-07-16

## 2021-07-15 MED ORDER — LIDOCAINE 2% (20 MG/ML) 5 ML SYRINGE
INTRAMUSCULAR | Status: DC | PRN
  Administered 2021-07-15: 60 mg via INTRAVENOUS

## 2021-07-15 MED ORDER — BUPIVACAINE LIPOSOME 1.3 % IJ SUSP
INTRAMUSCULAR | Status: DC | PRN
  Administered 2021-07-15: 20 mL

## 2021-07-15 MED ORDER — BUPIVACAINE-EPINEPHRINE (PF) 0.25% -1:200000 IJ SOLN
INTRAMUSCULAR | Status: AC
Start: 2021-07-15 — End: ?
  Filled 2021-07-15: qty 30

## 2021-07-15 MED ORDER — FENTANYL CITRATE (PF) 100 MCG/2ML IJ SOLN
INTRAMUSCULAR | Status: AC
  Filled 2021-07-15: qty 2

## 2021-07-15 MED ORDER — ACETAMINOPHEN 325 MG PO TABS
650.0000 mg | ORAL_TABLET | Freq: Four times a day (QID) | ORAL | Status: DC
  Administered 2021-07-15 – 2021-07-16 (×3): 650 mg via ORAL
  Filled 2021-07-15 (×4): qty 2

## 2021-07-15 MED ORDER — BUPIVACAINE LIPOSOME 1.3 % IJ SUSP: 20.0000 mL | Freq: Once | INTRAMUSCULAR | Status: DC

## 2021-07-15 MED ORDER — CHLORHEXIDINE GLUCONATE CLOTH 2 % EX PADS
6.0000 | MEDICATED_PAD | Freq: Once | CUTANEOUS | Status: DC
Start: 2021-07-15 — End: 2021-07-15

## 2021-07-15 MED ORDER — ONDANSETRON HCL 4 MG/2ML IJ SOLN
INTRAMUSCULAR | Status: DC | PRN
  Administered 2021-07-15: 4 mg via INTRAVENOUS

## 2021-07-15 MED ORDER — KETOROLAC TROMETHAMINE 30 MG/ML IJ SOLN
INTRAMUSCULAR | Status: AC
  Filled 2021-07-15: qty 1

## 2021-07-15 MED ORDER — GABAPENTIN 300 MG PO CAPS
300.0000 mg | ORAL_CAPSULE | ORAL | Status: AC
  Administered 2021-07-15: 300 mg via ORAL
  Filled 2021-07-15: qty 1

## 2021-07-15 MED ORDER — ENOXAPARIN SODIUM 40 MG/0.4ML IJ SOSY
40.0000 mg | PREFILLED_SYRINGE | INTRAMUSCULAR | Status: DC
  Administered 2021-07-16: 40 mg via SUBCUTANEOUS
  Filled 2021-07-15: qty 0.4

## 2021-07-15 MED ORDER — AMISULPRIDE (ANTIEMETIC) 5 MG/2ML IV SOLN: 10.0000 mg | Freq: Once | INTRAVENOUS | Status: DC | PRN

## 2021-07-15 MED ORDER — ROCURONIUM BROMIDE 10 MG/ML (PF) SYRINGE
PREFILLED_SYRINGE | INTRAVENOUS | Status: AC
  Filled 2021-07-15: qty 10

## 2021-07-15 MED ORDER — ONDANSETRON HCL 4 MG/2ML IJ SOLN: 4.0000 mg | Freq: Four times a day (QID) | INTRAMUSCULAR | Status: DC | PRN

## 2021-07-15 MED ORDER — OXYCODONE HCL 5 MG PO TABS: 5.0000 mg | ORAL_TABLET | Freq: Once | ORAL | Status: DC | PRN

## 2021-07-15 MED ORDER — ONDANSETRON HCL 4 MG/2ML IJ SOLN
INTRAMUSCULAR | Status: AC
  Filled 2021-07-15: qty 2

## 2021-07-15 MED ORDER — ONDANSETRON HCL 4 MG/2ML IJ SOLN: 4.0000 mg | Freq: Once | INTRAMUSCULAR | Status: DC | PRN

## 2021-07-15 MED ORDER — ACETAMINOPHEN 500 MG PO TABS
1000.0000 mg | ORAL_TABLET | ORAL | Status: AC
  Administered 2021-07-15: 1000 mg via ORAL

## 2021-07-15 MED ORDER — LIDOCAINE HCL 2 % IJ SOLN
INTRAMUSCULAR | Status: AC
  Filled 2021-07-15: qty 20

## 2021-07-15 MED ORDER — OXYCODONE HCL 5 MG/5ML PO SOLN: 5.0000 mg | Freq: Once | ORAL | Status: DC | PRN

## 2021-07-15 MED ORDER — MIDAZOLAM HCL 5 MG/5ML IJ SOLN
INTRAMUSCULAR | Status: DC | PRN
Start: 2021-07-15 — End: 2021-07-15
  Administered 2021-07-15: 2 mg via INTRAVENOUS

## 2021-07-15 MED ORDER — ROCURONIUM BROMIDE 10 MG/ML (PF) SYRINGE
PREFILLED_SYRINGE | INTRAVENOUS | Status: DC | PRN
  Administered 2021-07-15 (×2): 10 mg via INTRAVENOUS
  Administered 2021-07-15: 70 mg via INTRAVENOUS
  Administered 2021-07-15: 20 mg via INTRAVENOUS

## 2021-07-15 MED ORDER — 0.9 % SODIUM CHLORIDE (POUR BTL) OPTIME
TOPICAL | Status: DC | PRN
  Administered 2021-07-15: 1000 mL

## 2021-07-15 MED ORDER — ORAL CARE MOUTH RINSE: 15.0000 mL | Freq: Once | OROMUCOSAL | Status: AC

## 2021-07-15 MED ORDER — CHLORHEXIDINE GLUCONATE 0.12 % MT SOLN
15.0000 mL | Freq: Once | OROMUCOSAL | Status: AC
  Administered 2021-07-15: 15 mL via OROMUCOSAL

## 2021-07-15 MED ORDER — CEFAZOLIN SODIUM-DEXTROSE 2-4 GM/100ML-% IV SOLN
2.0000 g | INTRAVENOUS | Status: AC
  Administered 2021-07-15 (×2): 2 g via INTRAVENOUS
  Filled 2021-07-15: qty 100

## 2021-07-15 MED ORDER — GABAPENTIN 300 MG PO CAPS
300.0000 mg | ORAL_CAPSULE | Freq: Three times a day (TID) | ORAL | Status: DC
  Administered 2021-07-15 – 2021-07-16 (×3): 300 mg via ORAL
  Filled 2021-07-15 (×3): qty 1

## 2021-07-15 MED ORDER — SUGAMMADEX SODIUM 200 MG/2ML IV SOLN
INTRAVENOUS | Status: DC | PRN
  Administered 2021-07-15: 150 mg via INTRAVENOUS

## 2021-07-15 MED ORDER — FENTANYL CITRATE (PF) 250 MCG/5ML IJ SOLN
INTRAMUSCULAR | Status: DC | PRN
  Administered 2021-07-15: 50 ug via INTRAVENOUS
  Administered 2021-07-15: 100 ug via INTRAVENOUS
  Administered 2021-07-15 (×3): 50 ug via INTRAVENOUS

## 2021-07-15 MED ORDER — PANTOPRAZOLE SODIUM 40 MG PO TBEC
40.0000 mg | DELAYED_RELEASE_TABLET | Freq: Every day | ORAL | Status: DC
  Administered 2021-07-16: 40 mg via ORAL
  Filled 2021-07-15: qty 1

## 2021-07-15 MED ORDER — KETOROLAC TROMETHAMINE 30 MG/ML IJ SOLN
INTRAMUSCULAR | Status: DC | PRN
  Administered 2021-07-15: 30 mg via INTRAVENOUS

## 2021-07-15 MED ORDER — OXYCODONE HCL 5 MG PO TABS
5.0000 mg | ORAL_TABLET | ORAL | Status: DC | PRN
  Administered 2021-07-15 – 2021-07-16 (×2): 5 mg via ORAL
  Filled 2021-07-15 (×2): qty 1

## 2021-07-15 MED ORDER — LIDOCAINE 20MG/ML (2%) 15 ML SYRINGE OPTIME
INTRAMUSCULAR | Status: DC | PRN
  Administered 2021-07-15: 1.5 mg/kg/h via INTRAVENOUS

## 2021-07-15 MED ORDER — PROPOFOL 10 MG/ML IV BOLUS
INTRAVENOUS | Status: DC | PRN
  Administered 2021-07-15: 150 mg via INTRAVENOUS

## 2021-07-15 MED ORDER — HYDROMORPHONE HCL 1 MG/ML IJ SOLN
1.0000 mg | INTRAMUSCULAR | Status: DC | PRN
  Filled 2021-07-15: qty 1

## 2021-07-15 MED ORDER — METHOCARBAMOL 500 MG IVPB - SIMPLE MED
500.0000 mg | Freq: Four times a day (QID) | INTRAVENOUS | Status: DC | PRN
Start: 2021-07-15 — End: 2021-07-16

## 2021-07-15 MED ORDER — TRAZODONE HCL 50 MG PO TABS: 50.0000 mg | ORAL_TABLET | Freq: Every evening | ORAL | Status: DC | PRN

## 2021-07-15 MED ORDER — OXYCODONE HCL 5 MG PO TABS
10.0000 mg | ORAL_TABLET | ORAL | Status: DC | PRN
  Administered 2021-07-15 – 2021-07-16 (×3): 10 mg via ORAL
  Filled 2021-07-15 (×3): qty 2

## 2021-07-15 MED ORDER — DEXAMETHASONE SODIUM PHOSPHATE 10 MG/ML IJ SOLN
INTRAMUSCULAR | Status: DC | PRN
Start: 2021-07-15 — End: 2021-07-15
  Administered 2021-07-15: 10 mg via INTRAVENOUS

## 2021-07-15 MED ORDER — PROCHLORPERAZINE EDISYLATE 10 MG/2ML IJ SOLN
10.0000 mg | INTRAMUSCULAR | Status: DC | PRN
Start: 2021-07-15 — End: 2021-07-16

## 2021-07-15 MED ORDER — FENTANYL CITRATE PF 50 MCG/ML IJ SOSY: 25.0000 ug | PREFILLED_SYRINGE | INTRAMUSCULAR | Status: DC | PRN

## 2021-07-15 MED ORDER — BUPIVACAINE-EPINEPHRINE 0.25% -1:200000 IJ SOLN
INTRAMUSCULAR | Status: DC | PRN
  Administered 2021-07-15: 30 mL

## 2021-07-15 MED ORDER — CEFAZOLIN SODIUM-DEXTROSE 2-4 GM/100ML-% IV SOLN
INTRAVENOUS | Status: AC
  Filled 2021-07-15: qty 100

## 2021-07-15 MED ORDER — PROPOFOL 10 MG/ML IV BOLUS
INTRAVENOUS | Status: AC
  Filled 2021-07-15: qty 20

## 2021-07-15 MED ORDER — ACETAMINOPHEN 500 MG PO TABS
1000.0000 mg | ORAL_TABLET | Freq: Once | ORAL | Status: DC
  Filled 2021-07-15: qty 2

## 2021-07-15 MED ORDER — CHLORHEXIDINE GLUCONATE CLOTH 2 % EX PADS: 6.0000 | MEDICATED_PAD | Freq: Once | CUTANEOUS | Status: DC

## 2021-07-15 MED ORDER — KETOROLAC TROMETHAMINE 15 MG/ML IJ SOLN: 15.0000 mg | INTRAMUSCULAR | Status: DC

## 2021-07-15 SURGICAL SUPPLY — 73 items
ADH SKN CLS APL DERMABOND .7 (GAUZE/BANDAGES/DRESSINGS) ×1
APL PRP STRL LF DISP 70% ISPRP (MISCELLANEOUS) ×1
BAG COUNTER SPONGE SURGICOUNT (BAG) ×3 IMPLANT
BAG SPNG CNTER NS LX DISP (BAG) ×1
BINDER ABDOMINAL 12 ML 46-62 (SOFTGOODS) ×1 IMPLANT
BLADE SURG SZ11 CARB STEEL (BLADE) ×3 IMPLANT
CHLORAPREP W/TINT 26 (MISCELLANEOUS) ×3 IMPLANT
COVER TIP SHEARS 8 DVNC (MISCELLANEOUS) ×2 IMPLANT
COVER TIP SHEARS 8MM DA VINCI (MISCELLANEOUS) ×2
DERMABOND ADVANCED (GAUZE/BANDAGES/DRESSINGS) ×1
DERMABOND ADVANCED .7 DNX12 (GAUZE/BANDAGES/DRESSINGS) IMPLANT
DEVICE TROCAR PUNCTURE CLOSURE (ENDOMECHANICALS) IMPLANT
DRAPE ARM DVNC X/XI (DISPOSABLE) ×8 IMPLANT
DRAPE COLUMN DVNC XI (DISPOSABLE) ×2 IMPLANT
DRAPE DA VINCI XI ARM (DISPOSABLE) ×8
DRAPE DA VINCI XI COLUMN (DISPOSABLE) ×2
DRSG TEGADERM 2-3/8X2-3/4 SM (GAUZE/BANDAGES/DRESSINGS) ×2 IMPLANT
DRSG TEGADERM 4X4.75 (GAUZE/BANDAGES/DRESSINGS) ×3 IMPLANT
ELECT L-HOOK LAP 45CM DISP (ELECTROSURGICAL) ×2
ELECT PENCIL ROCKER SW 15FT (MISCELLANEOUS) ×3 IMPLANT
ELECT REM PT RETURN 15FT ADLT (MISCELLANEOUS) ×3 IMPLANT
ELECTRODE L-HOOK LAP 45CM DISP (ELECTROSURGICAL) ×2 IMPLANT
GLOVE BIO SURGEON STRL SZ7.5 (GLOVE) ×6 IMPLANT
GLOVE BIOGEL PI IND STRL 8 (GLOVE) ×4 IMPLANT
GLOVE BIOGEL PI INDICATOR 8 (GLOVE) ×2
GOWN STRL REUS W/ TWL XL LVL3 (GOWN DISPOSABLE) ×6 IMPLANT
GOWN STRL REUS W/TWL XL LVL3 (GOWN DISPOSABLE) ×6
GRASPER SUT TROCAR 14GX15 (MISCELLANEOUS) IMPLANT
IRRIG SUCT STRYKERFLOW 2 WTIP (MISCELLANEOUS)
IRRIGATION SUCT STRKRFLW 2 WTP (MISCELLANEOUS) IMPLANT
KIT BASIN OR (CUSTOM PROCEDURE TRAY) ×3 IMPLANT
KIT TURNOVER KIT A (KITS) ×1 IMPLANT
MANIFOLD NEPTUNE II (INSTRUMENTS) ×3 IMPLANT
MESH 3DMAX 5X7 LT XLRG (Mesh General) ×1 IMPLANT
MESH 3DMAX 5X7 RT XLRG (Mesh General) ×1 IMPLANT
MESH SOFT 12X12IN BARD (Mesh General) ×1 IMPLANT
NDL SPNL 18GX3.5 QUINCKE PK (NEEDLE) ×2 IMPLANT
NEEDLE SPNL 18GX3.5 QUINCKE PK (NEEDLE) ×2 IMPLANT
OBTURATOR OPTICAL STANDARD 8MM (TROCAR) ×2
OBTURATOR OPTICAL STND 8 DVNC (TROCAR) ×1
OBTURATOR OPTICALSTD 8 DVNC (TROCAR) IMPLANT
PACK CARDIOVASCULAR III (CUSTOM PROCEDURE TRAY) ×3 IMPLANT
SEAL CANN UNIV 5-8 DVNC XI (MISCELLANEOUS) ×6 IMPLANT
SEAL XI 5MM-8MM UNIVERSAL (MISCELLANEOUS) ×8
SEALER VESSEL DA VINCI XI (MISCELLANEOUS)
SEALER VESSEL EXT DVNC XI (MISCELLANEOUS) IMPLANT
SOL ANTI FOG 6CC (MISCELLANEOUS) ×2 IMPLANT
SOLUTION ANTI FOG 6CC (MISCELLANEOUS) ×1
SOLUTION ELECTROLUBE (MISCELLANEOUS) ×3 IMPLANT
SPIKE FLUID TRANSFER (MISCELLANEOUS) ×3 IMPLANT
SUT ETHIBOND 0 36 GRN (SUTURE) ×1 IMPLANT
SUT MNCRL AB 4-0 PS2 18 (SUTURE) ×3 IMPLANT
SUT STRAFIX PDS 18 CTX (SUTURE) ×1 IMPLANT
SUT STRAFIX SPIRAL 2-0 3 (SUTURE) IMPLANT
SUT STRAFIX SPIRAL 2-0 5 (SUTURE) IMPLANT
SUT STRAFIX SPIRAL 2-0 9 (SUTURE) ×2 IMPLANT
SUT STRAFIX SYMMETRIC 0-0 12 (SUTURE)
SUT STRAFIX SYMMETRIC 0-0 18 (SUTURE)
SUT STRAFIX SYMMETRIC 0-0 24 (SUTURE)
SUT STRAFIX SYMMETRIC 1-0 12 (SUTURE)
SUT STRAFIX SYMMETRIC 1-0 24 (SUTURE) ×2
SUTURE STRAFIX SYMMETRC 0-0 12 (SUTURE) IMPLANT
SUTURE STRAFIX SYMMETRC 0-0 18 (SUTURE) IMPLANT
SUTURE STRAFIX SYMMETRC 0-0 24 (SUTURE) IMPLANT
SUTURE STRAFIX SYMMETRC 1-0 12 (SUTURE) IMPLANT
SUTURE STRAFIX SYMMETRC 1-0 24 (SUTURE) IMPLANT
SYR 20ML LL LF (SYRINGE) ×3 IMPLANT
TOWEL OR 17X26 10 PK STRL BLUE (TOWEL DISPOSABLE) ×3 IMPLANT
TOWEL OR NON WOVEN STRL DISP B (DISPOSABLE) IMPLANT
TROCAR BLADELESS OPT 5 100 (ENDOMECHANICALS) IMPLANT
TROCAR KII 12X100 BLADELESS (ENDOMECHANICALS) ×3 IMPLANT
TROCAR Z-THREAD FIOS 5X100MM (TROCAR) ×3 IMPLANT
TUBING INSUFFLATION 10FT LAP (TUBING) ×3 IMPLANT

## 2021-07-15 NOTE — H&P (Signed)
Admitting Physician: Nickola Major Shatika Grinnell  Service: General surgery  CC: Hernia  Subjective   HPI: Marc Mcdonald is a 66 y.o. male with history of COPD, alcohol abuse, heavy smoking, prior duodenal ulcer, presented with iron deficiency severe symptomatic anemia and was found to have a T3N0M0 rectosigmoid adenocarcionoma. This was resected via robotic LAR on 05/30/20. Chemotherapy and radiation were not part of his treatment plan after consultation with Dr. Burr Medico. He underwent robotic colostomy takedown on 02/13/21. At the time of ostomy takedown, the 8 cm x 8 cm defect in the abdominal wall was closed primarily.  He has developed a hernia at the previous ostomy site and was referred for evaluation for hernia repair.   The hernia is getting larger and is painful. He has an overlying scab that is not healing.     Past Medical History:  Diagnosis Date   Arthritis    Colon cancer ([place])    COPD (chronic obstructive pulmonary disease) ([place])    Duodenal ulcer    Hypertension     Past Surgical History:  Procedure Laterality Date   BALLOON DILATION N/A 05/26/2020   Procedure: BALLOON DILATION;  Surgeon: Lavena Bullion, DO;  Location: [facility];  Service: Endoscopy;  Laterality: N/A;   BIOPSY  05/26/2020   Procedure: BIOPSY;  Surgeon: Lavena Bullion, DO;  Location: [facility];  Service: Endoscopy;;   COLONOSCOPY WITH PROPOFOL N/A 05/26/2020   Procedure: COLONOSCOPY WITH PROPOFOL;  Surgeon: Lavena Bullion, DO;  Location: [facility];  Service: Endoscopy;  Laterality: N/A;   COLOSTOMY N/A 05/30/2020   Procedure: COLOSTOMY;  Surgeon: Ileana Roup, MD;  Location: [facility];  Service: General;  Laterality: N/A;   ESOPHAGOGASTRODUODENOSCOPY (EGD) WITH PROPOFOL N/A 05/26/2020   Procedure: ESOPHAGOGASTRODUODENOSCOPY (EGD) WITH PROPOFOL;  Surgeon: Lavena Bullion, DO;  Location: [place];  Service: Endoscopy;  Laterality: N/A;   FLEXIBLE SIGMOIDOSCOPY N/A 02/13/2021    Procedure: FLEXIBLE SIGMOIDOSCOPY;  Surgeon: Ileana Roup, MD;  Location: [facility];  Service: General;  Laterality: N/A;   HERNIA REPAIR     PARASTOMAL HERNIA REPAIR N/A 02/13/2021   Procedure: PRIMARY REPAIR CLOSURE OF PARASTOMAL HERNIA, INTRAOPERATIVE ASSESSMENT OF PERFUSION;  Surgeon: Ileana Roup, MD;  Location: [facility];  Service: General;  Laterality: N/A;   SUBMUCOSAL TATTOO INJECTION  05/26/2020   Procedure: SUBMUCOSAL TATTOO INJECTION;  Surgeon: Lavena Bullion, DO;  Location: [place];  Service: Endoscopy;;   XI ROBOTIC ASSISTED COLOSTOMY TAKEDOWN N/A 02/13/2021   Procedure: XI ROBOTIC ASSISTED COLOSTOMY TAKEDOWN, TAP BLOCK;  Surgeon: Ileana Roup, MD;  Location: [facility];  Service: General;  Laterality: N/A;   XI ROBOTIC ASSISTED LOWER ANTERIOR RESECTION N/A 05/30/2020   Procedure: XI ROBOTIC ASSISTED LOWER ANTERIOR RESECTION WITH END COLOSTOMY CREATION;  Surgeon: Ileana Roup, MD;  Location: [facility];  Service: General;  Laterality: N/A;    Family History  Family history unknown: Yes    Social:  reports that he quit smoking about 22 months ago. His smoking use included cigarettes. He has never used smokeless tobacco. He reports current alcohol use. He reports that he does not use drugs.  Allergies: No Known Allergies  Medications: Current Outpatient Medications  Medication Instructions   acetaminophen (TYLENOL) 1,000 mg, Oral, Every 6 hours PRN   amLODipine (NORVASC) 5 mg, Oral, Daily   pantoprazole (PROTONIX) 40 MG tablet TAKE 1 TABLET BY MOUTH EVERY DAY   Pediatric Multiple Vitamins (CHILDRENS MULTIVITAMIN) chewable tablet 1 tablet, Oral, Daily,  With iron   traZODone (DESYREL) 50 MG tablet TAKE 1 TABLET BY MOUTH AT BEDTIME AS NEEDED FOR SLEEP.    ROS - all of the below systems have been reviewed with the patient and positives are indicated with bold text General: chills, fever or night sweats Eyes: blurry vision or double vision ENT:  epistaxis or sore throat Allergy/Immunology: itchy/watery eyes or nasal congestion Hematologic/Lymphatic: bleeding problems, blood clots or swollen lymph nodes Endocrine: temperature intolerance or unexpected weight changes Breast: new or changing breast lumps or nipple discharge Resp: cough, shortness of breath, or wheezing CV: chest pain or dyspnea on exertion GI: as per HPI GU: dysuria, trouble voiding, or hematuria MSK: joint pain or joint stiffness Neuro: TIA or stroke symptoms Derm: pruritus and skin lesion changes Psych: anxiety and depression  Objective   PE Blood pressure (!) 166/92, pulse (!) 117, temperature 97.7 F (36.5 C), temperature source Oral, resp. rate 15, height '5\' 8"'$  (1.727 m), weight 60.7 kg, SpO2 98 %. Constitutional: NAD; conversant; no deformities Eyes: Moist conjunctiva; no lid lag; anicteric; PERRL Neck: Trachea midline; no thyromegaly Lungs: Normal respiratory effort; no tactile fremitus CV: RRR; no palpable thrills; no pitting edema GI: Abd -proximately 8 cm x 8 cm left mid abdomen incisional hernia with thin skin and palpable underlying intestine. Palpable approximately 2 cm x 2 cm umbilical defect at previous incision site as well. MSK: Normal range of motion of extremities; no clubbing/cyanosis Psychiatric: Appropriate affect; alert and oriented x3 Lymphatic: No palpable cervical or axillary lymphadenopathy  No results found for this or any previous visit (from the past 24 hour(s)).  Imaging Orders  No imaging studies ordered today  1. Subtle hypodensity posteriorly in the lateral segment left hepatic lobe could be from fatty infiltration but is technically nonspecific; given the patient's history of colon cancer, I would recommend definitive characterization using hepatic protocol MRI with and without contrast. 2. Other imaging findings of potential clinical significance: Aortic Atherosclerosis (ICD10-I70.0) and Emphysema (ICD10-J43.9).  Coronary atherosclerosis. Airway thickening is present, suggesting bronchitis or reactive airways disease. Cholelithiasis. Anastomotic staple line along the rectum. Mild bulging of the left anterior abdominal wall probably at the site of prior stoma. Levoconvex lumbar scoliosis with mild spondylosis and degenerative disc disease.    Assessment and Plan   Marc Mcdonald is an 66 y.o. male with a 8 cm x 8 cm previous ostomy site incisional hernia. Also has a 2 cm x 2 cm previous port site periumbilical incisional hernia.   I recommended robotic incisional hernia repair with mesh. The procedure itself as well as its risk, benefits, and alternatives were discussed with the patient in full who granted consent to proceed. We will proceed as scheduled.  Return for post op.   Felicie Morn, MD  [facility] Use [email or web address] to contact on call provider

## 2021-07-15 NOTE — Transfer of Care (Signed)
Immediate Anesthesia Transfer of Care Note  Patient: Marc Mcdonald  Procedure(s) Performed: XI ROBOTIC ASSISTED INCISIONAL HERNIA REPAIR WITH MESH  Patient Location: PACU  Anesthesia Type:General  Level of Consciousness: awake, alert  and oriented  Airway & Oxygen Therapy: Patient Spontanous Breathing and Patient connected to face mask oxygen  Post-op Assessment: Report given to RN and Post -op Vital signs reviewed and stable  Post vital signs: Reviewed and stable  Last Vitals:  Vitals Value Taken Time  BP    Temp    Pulse 108 07/15/21 1159  Resp 18 07/15/21 1159  SpO2 100 % 07/15/21 1159  Vitals shown include unvalidated device data.  Last Pain:  Vitals:   07/15/21 0547  TempSrc:   PainSc: 4       Patients Stated Pain Goal: 3 (03/49/61 1643)  Complications: No notable events documented.

## 2021-07-15 NOTE — Anesthesia Procedure Notes (Signed)
Procedure Name: Intubation Date/Time: 07/15/2021 7:31 AM Performed by: Curley Fayette D, CRNA Pre-anesthesia Checklist: Patient identified, Emergency Drugs available, Suction available and Patient being monitored Patient Re-evaluated:Patient Re-evaluated prior to induction Oxygen Delivery Method: Circle system utilized Preoxygenation: Pre-oxygenation with 100% oxygen Induction Type: IV induction Ventilation: Mask ventilation without difficulty Laryngoscope Size: Mac and 4 Grade View: Grade I Tube type: Oral Tube size: 7.5 mm Number of attempts: 1 Airway Equipment and Method: Stylet and Oral airway Placement Confirmation: ETT inserted through vocal cords under direct vision, positive ETCO2 and breath sounds checked- equal and bilateral Secured at: 22 cm Tube secured with: Tape Dental Injury: Teeth and Oropharynx as per pre-operative assessment

## 2021-07-15 NOTE — Anesthesia Postprocedure Evaluation (Signed)
Anesthesia Post Note  Patient: Marc Mcdonald  Procedure(s) Performed: XI ROBOTIC ASSISTED INCISIONAL HERNIA REPAIR WITH MESH     Patient location during evaluation: PACU Anesthesia Type: General Level of consciousness: awake and alert Pain management: pain level controlled Vital Signs Assessment: post-procedure vital signs reviewed and stable Respiratory status: spontaneous breathing, nonlabored ventilation and respiratory function stable Cardiovascular status: blood pressure returned to baseline and stable Postop Assessment: no apparent nausea or vomiting Anesthetic complications: no   No notable events documented.  Last Vitals:  Vitals:   07/15/21 1245 07/15/21 1303  BP: 129/85 (!) 143/90  Pulse: 99 (!) 106  Resp: (!) 27 18  Temp:    SpO2: 93% 100%    Last Pain:  Vitals:   07/15/21 1316  TempSrc:   PainSc: 9                  Hunt Zajicek E Dennison Mcdaid

## 2021-07-15 NOTE — Op Note (Signed)
Patient: Marc Mcdonald (12-Jun-1955, [id number])  Date of Surgery: 07/15/2021   Preoperative Diagnosis: RECURRENT VENTRAL HERNIA   Postoperative Diagnosis: RECURRENT VENTRAL HERNIA AND INITIAL DIRECT RIGHT INGUINAL HERNIA  Surgical Procedure: XI ROBOTIC ASSISTED INCISIONAL HERNIA REPAIR WITH MESH:   Bilateral posterior rectus myofascial releases Bilateral transversus abdominis myofascial releases Initial left direct inguinal hernia repair  Operative Team Members:  Surgeon(s) and Role:    * Aleathia Purdy, Hyman Hopes, MD - Primary   Anesthesiologist: Lucretia Kern, MD CRNA: Elisabeth Cara, CRNA; Wynonia Sours, CRNA; Williford, Peggy D, CRNA   Anesthesia: General   Fluids:  Total I/O In: 1000 [I.V.:1000] Out: 125 [Urine:100; Blood:25]  Complications: None  Drains:  None  Specimen: None  Disposition:  PACU - hemodynamically stable.  Plan of Care: Admit for overnight observation  Indications for Procedure:   Marc Mcdonald is an 66 y.o. male with a 8 cm x 8 cm previous ostomy site incisional hernia. Also has a 2 cm x 2 cm previous port site periumbilical incisional hernia.   I recommended robotic ventral hernia repair with mesh.  The procedure itself as well as the risks, benefits and alternatives were described.  The risks discussed included but were not limited to the risk of infection, bleeding, damage to nearby structures, recurrent hernia, chronic pain, and mesh complication requiring removal.  After a full discussion and all questions answered, the patient granted consent to proceed.  Findings:  Hernia Location: Ventral hernia location: Umbilical (M3) and Left Lateral Flank (L2) and right direct inguinal.  Hernia Size:   -  12 cm wide x 11 cm tall ventral hernia (including two defects, a few centimeters apart - an 8cm wide by 11 cm tall defect at the previous ostomy site in the left rectus muscle into the left lateral musculature and a 2cm x 2cm defect at the  umbilicus) -  Small direct right inguinal hernia containing fat  Mesh Size &Type:   25 tall x 30 cm wide Bard Soft Mesh to cover the ventral hernia defect Bard Soft 3D Max Right Extra Large  Bard Soft 3D Max Left Extra Large Total mesh coverage measures approximately 30 cm x 30 cm  Mesh Position: Sublay - Retromuscular  Myofascial Releases:  Bilateral posterior rectus myofascial release  Bilateral Transversus abdominus myofascial release   Description of Procedure: The patient was positioned supine, moderately flexed at the umbilical level, padded and secured on the operating table.  A timeout procedure was performed.    What is described is a robotic, totally extraperitoneal retromuscular incisional hernia repair with bilateral rectus myofascial release and retromuscular mesh placement.  Laparoscopic Portion: The retrorectus space was entered in the LEFT hypochondrium, at approximately the midclavicular line utilizing a 5 mm optical-viewing trocar.  Upon safe entry into this space, it was insufflated while performing a blunt dissection with the camera still in the optical trocar. A rectus myofascial release was performed on the LEFT side. Dissection was carried out laterally in the retromuscular plane to the edge of the rectus sheath progressively disconnecting the rectus muscle from the underlying posterior rectus sheath. Both the segmental innervation as well as the intercostal artery and vein brances to the rectus muscle were individually preserved.    During the left sided retrorectus dissection, a 12 mm trocar was placed into the lateral most edge of the retrorectus space.  With these initial trocars in position, the medial most aspect of the retrorectus plane was identified, and the posterior  sheath was visualized as it inserted on the linea alba. The posterior sheath was incised with cautery entering the preperitoneal plane. A crossover was performed dissecting under the linea alba in  the preperitoneal plane until the right rectus sheath was identified.  After identification of the right rectus sheath, it was incised vertically to enter the retrorectus space on the right. A rectus myofascial release was performed on the RIGHT side.  Blunt dissection was carried out laterally in the retromuscular plane to the edge of the rectus sheath progressively disconnecting the rectus muscle from the underlying posterior rectus sheath. Both the segmental innervation as well as the intercostal artery and vein brances to the rectus muscle were individually preserved.   At this juncture, both retrorectus planes were initially connected to each other and there was space for further trocar placement. An 8 mm robotic trocar was placed in the midclavicular line in right retrorectus space.  A 8mm robotic trocar was placed within the left rectus musculature in the upper abdomen, and not through the linea alba.  The initial 5 mm access trocar in the midclavicular line within the left retrorectus space was switched out for an 8 mm robotic trocar.   Robotic Portion: The Intuitive daVinci Xi surgical robot was docked in the standard fashion and the procedure begun from the robotic console. A Prograsp instrument and monopolar shears were used for the dissection.  Dissection was carried down inferiorly preserving the peritoneum and the preperitoneal fat in the midline as it was gently dissected off of the overlying linea alba.  On the right side, the posterior rectus sheath was progressively disconnected from its insertion on the linea alba. This allowed for progression of the right side rectus myofascial release.  The rectus myofascial release accomplished medialization of the posterior rectus sheath towards the midline and disinsertion of the rectus muscle from its surrounding fascia, and thus its encasement in the rectus sheath, allowing for widening of the rectus muscle and transfer of the rectus flap towards  the midline.  This will allow for future inset of the medial aspect of the flap for abdominal wall reconstruction.  Similarly, on the left side, the posterior rectus sheath was also progressively disconnected from its insertion on the linea alba.  This allowed for progression of the left side rectus myofascial release.  The rectus myofascial release accomplished medialization of the posterior rectus sheath towards the midline and disinsertion of the rectus muscle from its surrounding fascia, and thus its encasement in the rectus sheath, allowing for widening of the rectus muscle and transfer of the rectus flap towards the midline.  This will allow for future inset of the medial aspect of the flap for abdominal wall reconstruction.  During the dissection of the midline and the left rectus and lateral musculature the hernia defect was identified and the hernia sac was not reducible, therefore the hernia peritoneum was incised circumferentially around the edge of the hernia defect which left a defect within the peritoneum in the midline.  This defect was later closed with a running 2-0 Ethicon Stratafix Spiral PDS suture.  Both the left and the right rectus myofascial releases were performed towards the lower abdomen, past the arcuate line bilaterally.  During this dissection, the peritoneum and preperitoneal fat in the midline were further preserved below the hernia as they were dissected off of the overlying linea alba.   Due to the location of this hernia in the musculature and into the transversus abdominis muscle in the left,  and the size of the hernia defect, I decided to perform bilateral transversus abdominis myofascial releases.  A transversus abdominis release (TAR) was performed on the left side.  The transversus abdominis muscle was identified deep to the posterior rectus sheath and incised vertically along its entire length, entering the pre-peritoneal or pre-transversalis fascia plane.  This  disinserted the transversus abdominis muscle from the linea semilunaris.  Since the intercostal nerves, arteries and veins had been preserved during the rectus myofascial release portion of the procedure, they remained intact during the TAR. The peritoneum was subsequently peeled away from the underside of the divided transversus abdominis muscle.  The lateral aspect of the hernia defect was dissected to allow full release of the transversus abdominis muscle.  This dissection was carried out laterally towards the retroperitoneum.  The TAR accomplished additional medialization of the posterior rectus sheath with its attached peritoneum towards the midline to allow for visceral sac closure.  The TAR also provided further offset of tension of the rectus muscle flap with additional transfer of the rectus muscle towards the midline, as it remained attached to the external and internal abdominal oblique muscles.  This will allow for future inset of the medial aspect of the flap for abdominal wall reconstruction.   A transversus abdominis release (TAR) was performed on the right side.  The transversus abdominis muscle was identified deep to the posterior rectus sheath and incised vertically along its entire length, entering the pre-peritoneal or pre-transversalis fascia plane.  This disinserted the transversus abdominis muscle from the linea semilunaris.  Since the intercostal nerves, arteries and veins had been preserved during the rectus myofascial release portion of the procedure, they remained intact during the TAR. The peritoneum was subsequently peeled away from the underside of the divided transversus abdominis muscle.  This dissection was carried out laterally towards the retroperitoneum.  The TAR accomplished additional medialization of the posterior rectus sheath with its attached peritoneum towards the midline to allow for visceral sac closure.  The TAR also provided further offset of tension of the rectus  muscle flap with additional transfer of the rectus muscle towards the midline, as it remained attached to the external and internal abdominal oblique muscles.  This will allow for future inset of the medial aspect of the flap for abdominal wall reconstruction.   The patient had a previous left inguinal hernia repair, and the large ventral hernia was not far from this anatomy.  I decided to dissect out the groin to allow for sufficient mesh overlap.  There was no evidence of recurrent hernia on the left.  I did not encounter the previous mesh placed in open fashion.  The cord contents were parietalized and preserved.  A large pre peritoneal dissection was performed to uncover the direct, indirect, femoral and obturator spaces.  Cooper's ligament was uncovered medially and the psoas muscle uncovered laterally.  The mesh, Bard 3D max extra large left sided mesh, was opened and advanced into the pre peritoneal position so that it more than adequately covered the indirect, direct, femoral and obturator spaces.  The mesh laid flat, with no inferior folds and covered the entire myopectineal orifice.  The mesh was fixated with an 0-ethibond suture to Cooper's ligament and the posterior aspect of the rectus muscle.    There was a direct, fat containing hernia on the right.  The cord contents were parietalized and preserved.  A large pre peritoneal dissection was performed to uncover the direct, indirect, femoral and obturator spaces.  Cooper's ligament was uncovered medially and the psoas muscle uncovered laterally.  The mesh, Bard 3D max extra large right sided mesh, was opened and advanced into the pre peritoneal position so that it more than adequately covered the indirect, direct, femoral and obturator spaces.  The mesh laid flat, with no inferior folds and covered the entire myopectineal orifice.  The mesh was fixated with an 0-ethibond suture to Cooper's ligament and the posterior aspect of the rectus muscle.     The ventral hernia defect area was now visualized fully.  The hernia defects were located in the umbilical and lateral flank regions. Utilizing a metric ruler, the defect are was measured intracorporeally to be 12 cm horizontal by 11 cm vertical.  The lateral defect was closed utilizing a continuous, #1 Ethicon Stratafix Symmetric PDS Plus suture.  The midline defect and rectus muscles were then closed with a second #1 Ethicon Stratafix Symmetric PDS suture. The hernia defect, and subsequently the rectus musculature, came together well for a complete abdominal wall reconstruction.  The dissected out retrorectus space was measured with a metric ruler so as to determine the size of the proposed mesh.    The robot was undocked and the laparoscope was inserted, inspecting for hemostasis.  The mesh deployment was performed laparoscopically.  Laparoscopic Portion:  A transversus abdominis plane (TAP) block was performed bilaterally with a mixture of marcaine and Exparel.  The anesthetic was first injected into the plane between the transversus abdominis and internal abdominal oblique muscles on the left. The TAP was repeated on the contralateral side.   A piece of Bard Soft was opened and trimmed to 25 cm tall x 30 cm wide. The mesh was advanced into the retrorectus space and the mesh positioned flat against the intact posterior rectus sheaths.  It overlapped the previously placed inguinal hernia meshes and provided good coverage of the ventral hernia defect and port sites. The mesh was not fixated as it occupied the entire retromuscular plane, and also covered all of the trocars.  The trocars were removed and the skin closed with 4-0 Monocryl subcuticular sutures and skin glue.   Ivar Drape, MD General, Bariatric, & Minimally Invasive Surgery [facility], [name]

## 2021-07-15 NOTE — Progress Notes (Signed)
Patient refusing to ambulate at this time.

## 2021-07-16 ENCOUNTER — Encounter (HOSPITAL_COMMUNITY): Payer: Self-pay | Admitting: Surgery

## 2021-07-16 DIAGNOSIS — K432 Incisional hernia without obstruction or gangrene: Secondary | ICD-10-CM | POA: Diagnosis not present

## 2021-07-16 LAB — BASIC METABOLIC PANEL WITH GFR
Anion gap: 7 (ref 5–15)
BUN: 7 mg/dL — ABNORMAL LOW (ref 8–23)
CO2: 25 mmol/L (ref 22–32)
Calcium: 8.2 mg/dL — ABNORMAL LOW (ref 8.9–10.3)
Chloride: 104 mmol/L (ref 98–111)
Creatinine, Ser: 0.67 mg/dL (ref 0.61–1.24)
GFR, Estimated: >60 mL/min
Glucose, Bld: 138 mg/dL — ABNORMAL HIGH (ref 70–99)
Potassium: 3.8 mmol/L (ref 3.5–5.1)
Sodium: 136 mmol/L (ref 135–145)

## 2021-07-16 LAB — CBC
HCT: 35.2 % — ABNORMAL LOW (ref 39.0–52.0)
Hemoglobin: 11.7 g/dL — ABNORMAL LOW (ref 13.0–17.0)
MCH: 33.4 pg (ref 26.0–34.0)
MCHC: 33.2 g/dL (ref 30.0–36.0)
MCV: 100.6 fL — ABNORMAL HIGH (ref 80.0–100.0)
Platelets: 201 K/uL (ref 150–400)
RBC: 3.5 MIL/uL — ABNORMAL LOW (ref 4.22–5.81)
RDW: 17.7 % — ABNORMAL HIGH (ref 11.5–15.5)
WBC: 10.4 K/uL (ref 4.0–10.5)
nRBC: 0 % (ref 0.0–0.2)

## 2021-07-16 MED ORDER — OXYCODONE-ACETAMINOPHEN 5-325 MG PO TABS: 1.0000 | ORAL_TABLET | ORAL | 0 refills | Status: DC | PRN

## 2021-07-16 NOTE — Discharge Summary (Signed)
Patient ID: Marc Mcdonald [id number] y.o. 04-Jun-1955  07/15/2021  Discharge date and time: 07/16/2021  Admitting Physician: Marc Mcdonald  Discharge Physician: Marc Mcdonald  Admission Diagnoses: Incisional hernia [K43.2] Patient Active Problem List   Diagnosis Date Noted   Incisional hernia 07/15/2021   Hypertension 03/15/2021   S/P colostomy takedown 02/13/2021   Insomnia 07/20/2020   Pulmonary nodule 06/26/2020   History of creation of ostomy (HCC) 06/26/2020   Hyponatremia 06/06/2020   sigmoid colon cancer 06/03/2020   Ileus following gastrointestinal surgery (HCC)    Aortic atherosclerosis (HCC) 06/01/2020   Protein-calorie malnutrition, severe 05/28/2020   Iron deficiency anemia due to chronic blood loss    Duodenal ulcer    Gastritis and gastroduodenitis    Gastroesophageal reflux disease with esophagitis without hemorrhage    Esophageal stricture    Rectal mass    Dyspnea 05/25/2020   Hypotension 05/25/2020   Tachycardia 05/25/2020   Phase of life problem 05/25/2020   Pruritus 05/25/2020   Symptomatic anemia 05/25/2020   Surgical follow-up care 10/12/2019   Left-sided chest wall pain 09/14/2019   Closed traumatic fracture of ribs of left side with pneumothorax 09/09/2019     Discharge Diagnoses: hernia Patient Active Problem List   Diagnosis Date Noted   Incisional hernia 07/15/2021   Hypertension 03/15/2021   S/P colostomy takedown 02/13/2021   Insomnia 07/20/2020   Pulmonary nodule 06/26/2020   History of creation of ostomy (HCC) 06/26/2020   Hyponatremia 06/06/2020   sigmoid colon cancer 06/03/2020   Ileus following gastrointestinal surgery (HCC)    Aortic atherosclerosis (HCC) 06/01/2020   Protein-calorie malnutrition, severe 05/28/2020   Iron deficiency anemia due to chronic blood loss    Duodenal ulcer    Gastritis and gastroduodenitis    Gastroesophageal reflux disease with esophagitis without hemorrhage    Esophageal  stricture    Rectal mass    Dyspnea 05/25/2020   Hypotension 05/25/2020   Tachycardia 05/25/2020   Phase of life problem 05/25/2020   Pruritus 05/25/2020   Symptomatic anemia 05/25/2020   Surgical follow-up care 10/12/2019   Left-sided chest wall pain 09/14/2019   Closed traumatic fracture of ribs of left side with pneumothorax 09/09/2019    Operations: Procedure(s): XI ROBOTIC ASSISTED INCISIONAL HERNIA REPAIR WITH MESH  Admission Condition: good  Discharged Condition: good  Indication for Admission: hernia  Hospital Course: [name] underwent robotic ventral and right inguinal hernia repair with mesh.  He was discharged the following day.  Consults: None  Significant Diagnostic Studies: None  Treatments: surgery: as above  Disposition: Home  Patient Instructions:  Allergies as of 07/16/2021   No Known Allergies      Medication List     TAKE these medications    acetaminophen 500 MG tablet Commonly known as: TYLENOL Take 1,000 mg by mouth every 6 (six) hours as needed for mild pain or moderate pain.   amLODipine 5 MG tablet Commonly known as: NORVASC Take 1 tablet (5 mg total) by mouth daily.   childrens multivitamin chewable tablet Chew 1 tablet by mouth daily. With iron   oxyCODONE-acetaminophen 5-325 MG tablet Commonly known as: Percocet Take 1 tablet by mouth every 4 (four) hours as needed for severe pain.   pantoprazole 40 MG tablet Commonly known as: PROTONIX TAKE 1 TABLET BY MOUTH EVERY DAY   traZODone 50 MG tablet Commonly known as: DESYREL TAKE 1 TABLET BY MOUTH AT BEDTIME AS NEEDED FOR SLEEP.  Activity: no heavy lifting for 4 weeks Diet: regular diet Wound Care: keep wound clean and dry  Follow-up:  With Dr. Dossie Der in 4 weeks.  Signed: Hyman Hopes Zelene Mcdonald General, Bariatric, & Minimally Invasive Surgery [facility], [name]   07/16/2021, 7:17 AM

## 2021-07-16 NOTE — Discharge Instructions (Signed)
 VENTRAL HERNIA REPAIR POST OPERATIVE INSTRUCTIONS  Thinking Clearly  The anesthesia may cause you to feel different for 1 or 2 days. Do not drive, drink alcohol, or make any big decisions for at least 2 days.  Nutrition When you wake up, you will be able to drink small amounts of liquid. If you do not feel sick, you can slowly advance your diet to regular foods. Continue to drink lots of fluids, usually about 8 to 10 glasses per day. Eat a high-fiber diet so you don't strain during bowel movements. High-Fiber Foods Foods high in fiber include beans, bran cereals and whole-grain breads, peas, dried fruit (figs, apricots, and dates), raspberries, blackberries, strawberries, sweet corn, broccoli, baked potatoes with skin, plums, pears, apples, greens, and nuts. Activity Slowly increase your activity. Be sure to get up and walk every hour or so to prevent blood clots. No heavy lifting or strenuous activity for 4 weeks following surgery to prevent hernias at your incision sites or recurrence of your hernia. It is normal to feel tired. You may need more sleep than usual.  Get your rest but make sure to get up and move around frequently to prevent blood clots and pneumonia.  Work and Return to School You can go back to work when you feel well enough. Discuss the timing with your surgeon. You can usually go back to school or work 1 week or less after an laparoscopic or an open repair. If your work requires heavy lifting or strenuous activity you need to be placed on light duty for 4 weeks following surgery. You can return to gym class, sports or other physical activities 4 weeks after surgery.  Wound Care You may experience significant bruising throughout the abdominal wall that may track down into the groin including into the scrotum in males.  Rest, elevating the groin and scrotum above the level of the heart, ice and compression with tight fitting underwear or an abdominal binder can help.   Always wash your hands before and after touching near your incision site. Do not soak in a bathtub until cleared at your follow up appointment. You may take a shower 24 hours after surgery. A small amount of drainage from the incision is normal. If the drainage is thick and yellow or the site is red, you may have an infection, so call your surgeon. If you have a drain in one of your incisions, it will be taken out in office when the drainage stops. Steri-Strips will fall off in 7 to 10 days or they will be removed during your first office visit. If you have dermabond glue covering over the incision, allow the glue to flake off on its own. Protect the new skin, especially from the sun. The sun can burn and cause darker scarring. Your scar will heal in about 4 to 6 weeks and will become softer and continue to fade over the next year.  The cosmetic appearance of the incisions will improve over the course of the first year after surgery. Sensation around your incision will return in a few weeks or months.  Bowel Movements After intestinal surgery, you may have loose watery stools for several days. If watery diarrhea lasts longer than 3 days, contact your surgeon. Pain medication (narcotics) can cause constipation. Increase the fiber in your diet with high-fiber foods if you are constipated. You can take an over the counter stool softener like Colace to avoid constipation.  Additional over the counter medications can also be used   if Colace isn't sufficient (for example, Milk of Magnesia or Miralax).  Pain The amount of pain is different for each person. Some people need only 1 to 3 doses of pain control medication, while others need more. Take alternating doses of tylenol and ibuprofen around the clock for the first five days following surgery.  This will provide a baseline of pain control and help with inflammation.  Take the narcotic pain medication in addition if needed for severe pain.  Contact  Your Surgeon at [phone], if you have: Pain that will not go away Pain that gets worse A fever of more than 101F (38.3C) Repeated vomiting Swelling, redness, bleeding, or bad-smelling drainage from your wound site Strong abdominal pain No bowel movement or unable to pass gas for 3 days Watery diarrhea lasting longer than 3 days  Pain Control The goal of pain control is to minimize pain, keep you moving and help you heal. Your surgical team will work with you on your pain plan. Most often a combination of therapies and medications are used to control your pain. You may also be given medication (local anesthetic) at the surgical site. This may help control your pain for several days. Extreme pain puts extra stress on your body at a time when your body needs to focus on healing. Do not wait until your pain has reached a level "10" or is unbearable before telling your doctor or nurse. It is much easier to control pain before it becomes severe. Following a laparoscopic procedure, pain is sometimes felt in the shoulder. This is due to the gas inserted into your abdomen during the procedure. Moving and walking helps to decrease the gas and the right shoulder pain.  Use the guide below for ways to manage your post-operative pain. Learn more by going to facs.org/safepaincontrol.  How Intense Is My Pain Common Therapies to Feel Better       I hardly notice my pain, and it does not interfere with my activities.  I notice my pain and it distracts me, but I can still do activities (sitting up, walking, standing).  Non-Medication Therapies  Ice (in a bag, applied over clothing at the surgical site), elevation, rest, meditation, massage, distraction (music, TV, play) walking and mild exercise Splinting the abdomen with pillows +  Non-Opioid Medications Acetaminophen (Tylenol) Non-steroidal anti-inflammatory drugs (NSAIDS) Aspirin, Ibuprofen (Motrin, Advil) Naproxen (Aleve) Take these as  needed, when you feel pain. Both acetaminophen and NSAIDs help to decrease pain and swelling (inflammation).      My pain is hard to ignore and is more noticeable even when I rest.  My pain interferes with my usual activities.  Non-Medication Therapies  +  Non-Opioid medications  Take on a regular schedule (around-the-clock) instead of as needed. (For example, Tylenol every 6 hours at 9:00 am, 3:00 pm, 9:00 pm, 3:00 am and Motrin every 6 hours at 12:00 am, 6:00 am, 12:00 pm, 6:00 pm)         I am focused on my pain, and I am not doing my daily activities.  I am groaning in pain, and I cannot sleep. I am unable to do anything.  My pain is as bad as it could be, and nothing else matters.  Non-Medication Therapies  +  Around-the-Clock Non-Opioid Medications  +  Short-acting opioids  Opioids should be used with other medications to manage severe pain. Opioids block pain and give a feeling of euphoria (feel high). Addiction, a serious side effect of opioids, is   rare with short-term (a few days) use.  Examples of short-acting opioids include: Tramadol (Ultram), Hydrocodone (Norco, Vicodin), Hydromorphone (Dilaudid), Oxycodone (Oxycontin)     The above directions have been adapted from the American College [employer] Surgical Patient Education Program.  Please refer to the ACS website if needed: https://[email or web address].org/-/media/files/education/[email or web address].[email or web address]   Auden Tatar, MD [facility] [address], [address], [place], [place]  [address] ?  [address] [address], [place], [place]   [address] [phone] ? [phone] ? FAX [phone] Web site: [email or web address]  

## 2021-07-16 NOTE — Progress Notes (Signed)
Transition of Care [facility]) Screening Note  Patient Details  Name: MORRELL FLUKE Date of Birth: [birth year]  [facility] CM/SW Contact:    Sherie Don, LCSW Phone Number: 07/16/2021, 8:50 AM  Transition of Care Department [facility]) has reviewed patient and no TOC needs have been identified at this time. We will continue to monitor patient advancement through interdisciplinary progression rounds. If new patient transition needs arise, please place a TOC consult.

## 2021-07-16 NOTE — Progress Notes (Signed)
Discharge instructions given to patient and all questions were answered.  

## 2021-07-19 ENCOUNTER — Ambulatory Visit (INDEPENDENT_AMBULATORY_CARE_PROVIDER_SITE_OTHER): Payer: Medicare Other | Admitting: Family Medicine

## 2021-07-19 ENCOUNTER — Encounter (HOSPITAL_BASED_OUTPATIENT_CLINIC_OR_DEPARTMENT_OTHER): Payer: Self-pay | Admitting: Family Medicine

## 2021-07-19 VITALS — BP 143/74 | HR 96 | Ht 68.0 in | Wt 137.4 lb

## 2021-07-19 DIAGNOSIS — G4700 Insomnia, unspecified: Secondary | ICD-10-CM

## 2021-07-19 DIAGNOSIS — I10 Essential (primary) hypertension: Secondary | ICD-10-CM

## 2021-07-19 MED ORDER — TRAZODONE HCL 50 MG PO TABS: 50.0000 mg | ORAL_TABLET | Freq: Every evening | ORAL | 1 refills | Status: DC | PRN

## 2021-07-19 NOTE — Assessment & Plan Note (Signed)
Systolic blood pressure slightly above goal in office today, diastolic is adequately controlled.  At last office visit, blood pressure was at goal.  He continues with amlodipine 5 mg daily, not checking blood pressure at home.  No current issues with chest pain or headaches We will continue with amlodipine and closely monitoring blood pressure over the coming months Recommend intermittent monitoring at home, patient does not have cough, recommend obtaining one or checking blood pressure occasionally when at the grocery store or pharmacy Recommend [place], provided handout today

## 2021-07-19 NOTE — Progress Notes (Signed)
    Procedures performed today:    None.  Independent interpretation of notes and tests performed by another provider:   None.  Brief History, Exam, Impression, and Recommendations:    BP (!) 143/74   Pulse 96   Ht '5\' 8"'$  (1.727 m)   Wt 137 lb 6.4 oz (62.3 kg)   SpO2 97%   BMI 20.89 kg/m   Hypertension Systolic blood pressure slightly above goal in office today, diastolic is adequately controlled.  At last office visit, blood pressure was at goal.  He continues with amlodipine 5 mg daily, not checking blood pressure at home.  No current issues with chest pain or headaches We will continue with amlodipine and closely monitoring blood pressure over the coming months Recommend intermittent monitoring at home, patient does not have cough, recommend obtaining one or checking blood pressure occasionally when at the grocery store or pharmacy Recommend [place], provided handout today  Insomnia Continues to manage with trazodone, this provides adequate control for patient He indicates that trazodone was not at the pharmacy for him to pick up, although it does appear that new prescription was sent last week.  New prescription sent today for patient  Patient did have recent incisional hernia repair.  He reports some issues in the hospital where he felt that the nurses rushed to discharge him before his transportation was at the hospital and ready It also appears on chart review that he has been discharged from his GI provider due to a weird voicemail. He has follow-up next month with surgeon who completed incisional hernia repair  Return in about 4 months (around 11/19/2021) for HTN, insomnia.   ___________________________________________ Annick Dimaio de [place], MD, ABFM, CAQSM [facility] and [place]

## 2021-07-19 NOTE — Patient Instructions (Signed)

## 2021-07-19 NOTE — Assessment & Plan Note (Addendum)
Continues to manage with trazodone, this provides adequate control for patient He indicates that trazodone was not at the pharmacy for him to pick up, although it does appear that new prescription was sent last week.  New prescription sent today for patient

## 2021-08-16 ENCOUNTER — Ambulatory Visit (INDEPENDENT_AMBULATORY_CARE_PROVIDER_SITE_OTHER): Payer: Medicare Other

## 2021-08-16 ENCOUNTER — Encounter (HOSPITAL_BASED_OUTPATIENT_CLINIC_OR_DEPARTMENT_OTHER): Payer: Self-pay

## 2021-08-16 DIAGNOSIS — Z0000 Encounter for general adult medical examination without abnormal findings: Secondary | ICD-10-CM

## 2021-08-16 NOTE — Progress Notes (Signed)
Subjective:   Marc Mcdonald is a 66 y.o. male who presents for an Initial Medicare Annual Wellness Visit.  I connected with  Marc Mcdonald on 08/16/21 by a audio enabled telemedicine application and verified that I am speaking with the correct person using two identifiers.  Patient Location: Home  Provider Location: Home Office  I discussed the limitations of evaluation and management by telemedicine. The patient expressed understanding and agreed to proceed.    Objective:    There were no vitals filed for this visit. There is no height or weight on file to calculate BMI.     08/16/2021   11:49 AM 07/15/2021    1:05 PM 07/12/2021    8:16 AM 02/13/2021    5:00 PM 02/13/2021   11:26 AM 02/04/2021    8:10 AM 05/28/2020    9:48 AM  Advanced Directives  Does Patient Have a Medical Advance Directive? No No No No No No   Would patient like information on creating a medical advance directive? No - Patient declined No - Patient declined No - Patient declined No - Patient declined   No - Patient declined    Current Medications (verified) Outpatient Encounter Medications as of 08/16/2021  Medication Sig   acetaminophen (TYLENOL) 500 MG tablet Take 1,000 mg by mouth every 6 (six) hours as needed for mild pain or moderate pain.   amLODipine (NORVASC) 5 MG tablet Take 1 tablet (5 mg total) by mouth daily.   pantoprazole (PROTONIX) 40 MG tablet TAKE 1 TABLET BY MOUTH EVERY DAY   Pediatric Multiple Vitamins (CHILDRENS MULTIVITAMIN) chewable tablet Chew 1 tablet by mouth daily. With iron   traZODone (DESYREL) 50 MG tablet Take 1 tablet (50 mg total) by mouth at bedtime as needed. for sleep   [DISCONTINUED] oxyCODONE-acetaminophen (PERCOCET) 5-325 MG tablet Take 1 tablet by mouth every 4 (four) hours as needed for severe pain.   No facility-administered encounter medications on file as of 08/16/2021.    Allergies (verified) Patient has no known allergies.   History: Past Medical  History:  Diagnosis Date   Arthritis    Colon cancer ([place])    COPD (chronic obstructive pulmonary disease) ([place])    Duodenal ulcer    Hypertension    Past Surgical History:  Procedure Laterality Date   BALLOON DILATION N/A 05/26/2020   Procedure: BALLOON DILATION;  Surgeon: Lavena Bullion, DO;  Location: [facility];  Service: Endoscopy;  Laterality: N/A;   BIOPSY  05/26/2020   Procedure: BIOPSY;  Surgeon: Lavena Bullion, DO;  Location: [place];  Service: Endoscopy;;   COLONOSCOPY WITH PROPOFOL N/A 05/26/2020   Procedure: COLONOSCOPY WITH PROPOFOL;  Surgeon: Lavena Bullion, DO;  Location: [facility];  Service: Endoscopy;  Laterality: N/A;   COLOSTOMY N/A 05/30/2020   Procedure: COLOSTOMY;  Surgeon: Ileana Roup, MD;  Location: [place];  Service: General;  Laterality: N/A;   ESOPHAGOGASTRODUODENOSCOPY (EGD) WITH PROPOFOL N/A 05/26/2020   Procedure: ESOPHAGOGASTRODUODENOSCOPY (EGD) WITH PROPOFOL;  Surgeon: Lavena Bullion, DO;  Location: [place];  Service: Endoscopy;  Laterality: N/A;   FLEXIBLE SIGMOIDOSCOPY N/A 02/13/2021   Procedure: FLEXIBLE SIGMOIDOSCOPY;  Surgeon: Ileana Roup, MD;  Location: [facility];  Service: General;  Laterality: N/A;   HERNIA REPAIR     PARASTOMAL HERNIA REPAIR N/A 02/13/2021   Procedure: PRIMARY REPAIR CLOSURE OF PARASTOMAL HERNIA, INTRAOPERATIVE ASSESSMENT OF PERFUSION;  Surgeon: Ileana Roup, MD;  Location: [facility];  Service: General;  Laterality: N/A;  SUBMUCOSAL TATTOO INJECTION  05/26/2020   Procedure: SUBMUCOSAL TATTOO INJECTION;  Surgeon: Lavena Bullion, DO;  Location: [facility];  Service: Endoscopy;;   XI ROBOTIC ASSISTED COLOSTOMY TAKEDOWN N/A 02/13/2021   Procedure: XI ROBOTIC ASSISTED COLOSTOMY TAKEDOWN, TAP BLOCK;  Surgeon: Ileana Roup, MD;  Location: [facility];  Service: General;  Laterality: N/A;   XI ROBOTIC ASSISTED LOWER ANTERIOR RESECTION N/A 05/30/2020   Procedure: XI ROBOTIC  ASSISTED LOWER ANTERIOR RESECTION WITH END COLOSTOMY CREATION;  Surgeon: Ileana Roup, MD;  Location: [place];  Service: General;  Laterality: N/A;   XI ROBOTIC ASSISTED VENTRAL HERNIA N/A 07/15/2021   Procedure: XI ROBOTIC ASSISTED INCISIONAL HERNIA REPAIR WITH MESH;  Surgeon: Felicie Morn, MD;  Location: [facility];  Service: General;  Laterality: N/A;   Family History  Family history unknown: Yes   Social History   Socioeconomic History   Marital status: Divorced    Spouse name: Not on file   Number of children: Not on file   Years of education: Not on file   Highest education level: Not on file  Occupational History   Not on file  Tobacco Use   Smoking status: Former    Types: Cigarettes    Quit date: 08/25/2019    Years since quitting: 1.9   Smokeless tobacco: Never  Vaping Use   Vaping Use: Never used  Substance and Sexual Activity   Alcohol use: Yes    Comment: 3-4 a day   Drug use: Never   Sexual activity: Not Currently  Other Topics Concern   Not on file  Social History Narrative   Not on file   Social Determinants of Health   Financial Resource Strain: Low Risk  (08/16/2021)   Overall Financial Resource Strain (CARDIA)    Difficulty of Paying Living Expenses: Not hard at all  Food Insecurity: No Food Insecurity (08/16/2021)   Hunger Vital Sign    Worried About Running Out of Food in the Last Year: Never true    Ran Out of Food in the Last Year: Never true  Transportation Needs: No Transportation Needs (08/16/2021)   PRAPARE - Hydrologist (Medical): No    Lack of Transportation (Non-Medical): No  Physical Activity: Sufficiently Active (08/16/2021)   Exercise Vital Sign    Days of Exercise per Week: 7 days    Minutes of Exercise per Session: 30 min  Stress: No Stress Concern Present (08/16/2021)   [place]    Feeling of Stress : Not at all  Social  Connections: Socially Isolated (08/16/2021)   Social Connection and Isolation Panel [NHANES]    Frequency of Communication with Friends and Family: More than three times a week    Frequency of Social Gatherings with Friends and Family: Twice a week    Attends Religious Services: Never    Marine scientist or Organizations: No    Attends Music therapist: Never    Marital Status: Divorced    Tobacco Counseling Counseling given: Not Answered   Clinical Intake:  Pre-visit preparation completed: Yes  Pain : No/denies pain     Diabetes: No  How often do you need to have someone help you when you read instructions, pamphlets, or other written materials from your doctor or pharmacy?: 1 - Never What is the last grade level you completed in school?: 9th grade  Diabetic?no  Interpreter Needed?: No  Activities of Daily Living    08/16/2021   11:50 AM 07/19/2021    8:18 AM  In your present state of health, do you have any difficulty performing the following activities:  Hearing? 1 0  Vision? 0 0  Difficulty concentrating or making decisions? 0 0  Walking or climbing stairs? 0 0  Dressing or bathing? 0 0  Doing errands, shopping? 0 0  Preparing Food and eating ? N   Using the Toilet? N   In the past six months, have you accidently leaked urine? N   Do you have problems with loss of bowel control? N   Managing your Medications? N   Managing your Finances? N   Housekeeping or managing your Housekeeping? N     Patient Care Team: [place], Blondell Reveal, MD as PCP - General (Family Medicine) Jonnie Finner, RN (Inactive) as Oncology Nurse Navigator  Indicate any recent Medical Services you may have received from other than [facility] providers in the past year (date may be approximate).     Assessment:   This is a routine wellness examination for Bee.  Hearing/Vision screen No results found.  Dietary issues and exercise activities discussed:      Goals Addressed             This Visit's Progress    Family-spend time with family        Depression Screen    08/16/2021   11:41 AM 07/19/2021    8:18 AM 05/25/2020    9:25 AM  PHQ 2/9 Scores  PHQ - 2 Score 0 0 3  PHQ- 9 Score 0 0 13    Fall Risk    08/16/2021   11:49 AM 07/19/2021    8:18 AM 05/25/2020    9:37 AM  Fall Risk   Falls in the past year? 0 0 1  Number falls in past yr: 0 0 1  Injury with Fall? 0 0 1  Risk for fall due to : No Fall Risks No Fall Risks History of fall(s);Impaired balance/gait;Impaired mobility;Other (Comment)  Risk for fall due to: Comment   Lung disease, exertional dyspnea, frailty  Follow up Falls evaluation completed Falls evaluation completed Falls evaluation completed;Follow up appointment    FALL RISK PREVENTION PERTAINING TO THE HOME:  Any stairs in or around the home? No  If so, are there any without handrails?  na Home free of loose throw rugs in walkways, pet beds, electrical cords, etc? No  Adequate lighting in your home to reduce risk of falls? Yes   ASSISTIVE DEVICES UTILIZED TO PREVENT FALLS:  Life alert? No  Use of a cane, walker or w/c? Yes  Grab bars in the bathroom? No  Shower chair or bench in shower? yes Elevated toilet seat or a handicapped toilet? No      08/16/2021   11:53 AM  6CIT Screen  What Year? 0 points  What month? 0 points  What time? 0 points  Count back from 20 0 points  Months in reverse 2 points  Repeat phrase 4 points  Total Score 6 points    Immunizations  There is no immunization history on file for this patient.  TDAP status: Due, Education has been provided regarding the importance of this vaccine. Advised may receive this vaccine at local pharmacy or Health Dept. Aware to provide a copy of the vaccination record if obtained from local pharmacy or Health Dept. Verbalized acceptance and understanding.  Flu Vaccine status: Up to  date  Pneumococcal vaccine status: Due, Education has been  provided regarding the importance of this vaccine. Advised may receive this vaccine at local pharmacy or Health Dept. Aware to provide a copy of the vaccination record if obtained from local pharmacy or Health Dept. Verbalized acceptance and understanding.  Covid-19 vaccine status: Information provided on how to obtain vaccines.   Qualifies for Shingles Vaccine? Yes   Zostavax completed No   Shingrix Completed?: No.    Education has been provided regarding the importance of this vaccine. Patient has been advised to call insurance company to determine out of pocket expense if they have not yet received this vaccine. Advised may also receive vaccine at local pharmacy or Health Dept. Verbalized acceptance and understanding.  Screening Tests Health Maintenance  Topic Date Due   COVID-19 Vaccine (1) Never done   Hepatitis C Screening  Never done   TETANUS/TDAP  Never done   Zoster Vaccines- Shingrix (1 of 2) Never done   Pneumonia Vaccine 16+ Years old (1 - PCV) Never done   INFLUENZA VACCINE  09/24/2021   COLONOSCOPY (Pts 45-102yr Insurance coverage will need to be confirmed)  05/27/2030   HPV VACCINES  Aged Out    Health Maintenance  Health Maintenance Due  Topic Date Due   COVID-19 Vaccine (1) Never done   Hepatitis C Screening  Never done   TETANUS/TDAP  Never done   Zoster Vaccines- Shingrix (1 of 2) Never done   Pneumonia Vaccine 67 Years old (1 - PCV) Never done    Colorectal cancer screening: Type of screening: Colonoscopy. Completed 05/26/2020. Repeat every unknown years  Lung Cancer Screening: (Low Dose CT Chest recommended if Age 67-80years, 30 pack-year currently smoking OR have quit w/in 15years.) does qualify.   Lung Cancer Screening Referral: done  Additional Screening:  Hepatitis C Screening: does qualify; Completed no  Vision Screening: Recommended annual ophthalmology exams for early detection of glaucoma and other disorders of the eye. Is the patient up to  date with their annual eye exam?  Yes  Who is the provider or what is the name of the office in which the patient attends annual eye exams? Doesn't know name or place If pt is not established with a provider, would they like to be referred to a provider to establish care? No .   Dental Screening: Recommended annual dental exams for proper oral hygiene  Community Resource Referral / Chronic Care Management: CRR required this visit?  No   CCM required this visit?  No      Plan:     I have personally reviewed and noted the following in the patient's chart:   Medical and social history Use of alcohol, tobacco or illicit drugs  Current medications and supplements including opioid prescriptions. Patient is not currently taking opioid prescriptions. Functional ability and status Nutritional status Physical activity Advanced directives List of other physicians Hospitalizations, surgeries, and ER visits in previous 12 months Vitals Screenings to include cognitive, depression, and falls Referrals and appointments  In addition, I have reviewed and discussed with patient certain preventive protocols, quality metrics, and best practice recommendations. A written personalized care plan for preventive services as well as general preventive health recommendations were provided to patient.     TOctavio Manns  08/16/2021   Nurse Notes:   Patient would like to get TDAP at next visit.   Mr. JKovalenko, Thank you for taking time to come for your Medicare Wellness Visit. I appreciate your  ongoing commitment to your health goals. Please review the following plan we discussed and let me know if I can assist you in the future.   These are the goals we discussed:  Goals      Family-spend time with family        This is a list of the screening recommended for you and due dates:  Health Maintenance  Topic Date Due   COVID-19 Vaccine (1) Never done   Hepatitis C Screening: USPSTF Recommendation  to screen - Ages 67-79 yo.  Never done   Tetanus Vaccine  Never done   Zoster (Shingles) Vaccine (1 of 2) Never done   Pneumonia Vaccine (1 - PCV) Never done   Flu Shot  09/24/2021   Colon Cancer Screening  05/27/2030   HPV Vaccine  Aged Out   .

## 2021-08-30 ENCOUNTER — Encounter (HOSPITAL_COMMUNITY): Payer: Self-pay | Admitting: Nurse Practitioner

## 2021-09-06 ENCOUNTER — Encounter (HOSPITAL_COMMUNITY): Payer: Self-pay | Admitting: Nurse Practitioner

## 2021-09-17 ENCOUNTER — Encounter (HOSPITAL_COMMUNITY): Payer: Self-pay | Admitting: Nurse Practitioner

## 2021-09-20 ENCOUNTER — Encounter (HOSPITAL_COMMUNITY): Payer: Self-pay | Admitting: *Deleted

## 2021-09-20 ENCOUNTER — Emergency Department (HOSPITAL_COMMUNITY): Payer: Medicare Other

## 2021-09-20 ENCOUNTER — Emergency Department (HOSPITAL_COMMUNITY)
Admission: EM | Admit: 2021-09-20 | Discharge: 2021-09-20 | Disposition: A | Discharge status: Home / Self Care | Payer: Medicare Other | Attending: Emergency Medicine | Admitting: Emergency Medicine

## 2021-09-20 ENCOUNTER — Other Ambulatory Visit: Payer: Self-pay

## 2021-09-20 DIAGNOSIS — S0990XA Unspecified injury of head, initial encounter: Secondary | ICD-10-CM | POA: Diagnosis present

## 2021-09-20 DIAGNOSIS — F10129 Alcohol abuse with intoxication, unspecified: Secondary | ICD-10-CM | POA: Insufficient documentation

## 2021-09-20 DIAGNOSIS — S0101XA Laceration without foreign body of scalp, initial encounter: Secondary | ICD-10-CM

## 2021-09-20 DIAGNOSIS — F10929 Alcohol use, unspecified with intoxication, unspecified: Secondary | ICD-10-CM

## 2021-09-20 DIAGNOSIS — W19XXXA Unspecified fall, initial encounter: Secondary | ICD-10-CM | POA: Diagnosis not present

## 2021-09-20 DIAGNOSIS — M4312 Spondylolisthesis, cervical region: Secondary | ICD-10-CM | POA: Diagnosis not present

## 2021-09-20 DIAGNOSIS — J329 Chronic sinusitis, unspecified: Secondary | ICD-10-CM | POA: Insufficient documentation

## 2021-09-20 DIAGNOSIS — R4182 Altered mental status, unspecified: Secondary | ICD-10-CM | POA: Diagnosis not present

## 2021-09-20 DIAGNOSIS — M47812 Spondylosis without myelopathy or radiculopathy, cervical region: Secondary | ICD-10-CM | POA: Diagnosis not present

## 2021-09-20 LAB — COMPREHENSIVE METABOLIC PANEL
ALT: 21 U/L (ref 0–44)
AST: 45 U/L — ABNORMAL HIGH (ref 15–41)
Albumin: 3.5 g/dL (ref 3.5–5.0)
Alkaline Phosphatase: 77 U/L (ref 38–126)
Anion gap: 9 (ref 5–15)
BUN: <5 mg/dL — ABNORMAL LOW (ref 8–23)
CO2: 23 mmol/L (ref 22–32)
Calcium: 8.4 mg/dL — ABNORMAL LOW (ref 8.9–10.3)
Chloride: 104 mmol/L (ref 98–111)
Creatinine, Ser: 0.57 mg/dL — ABNORMAL LOW (ref 0.61–1.24)
GFR, Estimated: >60 mL/min (ref >60)
Glucose, Bld: 105 mg/dL — ABNORMAL HIGH (ref 70–99)
Potassium: 3.7 mmol/L (ref 3.5–5.1)
Sodium: 136 mmol/L (ref 135–145)
Total Bilirubin: 1.4 mg/dL — ABNORMAL HIGH (ref 0.3–1.2)
Total Protein: 7.2 g/dL (ref 6.5–8.1)

## 2021-09-20 LAB — CBC WITH DIFFERENTIAL/PLATELET
Abs Immature Granulocytes: 0.02 10*3/uL (ref 0.00–0.07)
Basophils Absolute: 0.1 10*3/uL (ref 0.0–0.1)
Basophils Relative: 1 %
Eosinophils Absolute: 0.2 10*3/uL (ref 0.0–0.5)
Eosinophils Relative: 2 %
HCT: 42.9 % (ref 39.0–52.0)
Hemoglobin: 14.6 g/dL (ref 13.0–17.0)
Immature Granulocytes: 0 %
Lymphocytes Relative: 42 %
Lymphs Abs: 3.3 10*3/uL (ref 0.7–4.0)
MCH: 34.2 pg — ABNORMAL HIGH (ref 26.0–34.0)
MCHC: 34 g/dL (ref 30.0–36.0)
MCV: 100.5 fL — ABNORMAL HIGH (ref 80.0–100.0)
Monocytes Absolute: 0.8 10*3/uL (ref 0.1–1.0)
Monocytes Relative: 10 %
Neutro Abs: 3.6 10*3/uL (ref 1.7–7.7)
Neutrophils Relative %: 45 %
Platelets: 238 10*3/uL (ref 150–400)
RBC: 4.27 MIL/uL (ref 4.22–5.81)
RDW: 14.5 % (ref 11.5–15.5)
WBC: 8 10*3/uL (ref 4.0–10.5)
nRBC: 0 % (ref 0.0–0.2)

## 2021-09-20 LAB — ETHANOL: Alcohol, Ethyl (B): 313 mg/dL (ref <10)

## 2021-09-20 NOTE — ED Provider Notes (Signed)
[facility] EMERGENCY DEPARTMENT Provider Note   CSN: [id number] Arrival date & time: 09/20/21  2001     History  Chief Complaint  Patient presents with   Marc Mcdonald is a 66 y.o. male.  HPI 66 year old male with a history of alcohol abuse presents with a fall and altered mental state.  EMS provides most of the history.  Patient does not know why he is here.  Patient reportedly fell twice while out with family.  Hit his head and briefly lost consciousness.  Has an abrasion and some swelling to the back of his head.  He has been combative and agitated since.  Family reported he seemed more altered than typical when he is drunk, which is often.  Arrives as level 2 trauma due to mental status changes with head injury.  Home Medications Prior to Admission medications   Medication Sig Start Date End Date Taking? Authorizing Provider  acetaminophen (TYLENOL) 500 MG tablet Take 1,000 mg by mouth every 6 (six) hours as needed for mild pain or moderate pain.    [provider]  amLODipine (NORVASC) 5 MG tablet Take 1 tablet (5 mg total) by mouth daily. 03/15/21   de [place], Raymond J, MD  pantoprazole (PROTONIX) 40 MG tablet TAKE 1 TABLET BY MOUTH EVERY DAY 07/11/21   [place], Blondell Reveal, MD  Pediatric Multiple Vitamins (CHILDRENS MULTIVITAMIN) chewable tablet Chew 1 tablet by mouth daily. With iron    [provider]  traZODone (DESYREL) 50 MG tablet Take 1 tablet (50 mg total) by mouth at bedtime as needed. for sleep 07/19/21   [place], Raymond J, MD      Allergies    Patient has no known allergies.    Review of Systems   Review of Systems  Unable to perform ROS: Mental status change    Physical Exam Updated Vital Signs BP 127/76   Pulse 92   Temp 98.3 F (36.8 C)   Resp 20   Ht '5\' 8"'$  (1.727 m)   Wt 61.7 kg   SpO2 94%   BMI 20.68 kg/m  Physical Exam Vitals and nursing note reviewed.  Constitutional:      Appearance: He is  well-developed. He is not diaphoretic.     Interventions: Cervical collar in place.     Comments: intoxicated  HENT:     Head: Normocephalic.   Eyes:     Pupils: Pupils are equal, round, and reactive to light.  Cardiovascular:     Rate and Rhythm: Normal rate and regular rhythm.     Heart sounds: Normal heart sounds.  Pulmonary:     Effort: Pulmonary effort is normal.     Breath sounds: Normal breath sounds.  Abdominal:     Palpations: Abdomen is soft.     Tenderness: There is no abdominal tenderness.     Comments: Abdominal surgical scar, no tenderness  Skin:    General: Skin is warm and dry.  Neurological:     Mental Status: He is alert. He is disoriented.     Comments: Awake, alert, intoxicated.  Equal strength in all 4 extremities     ED Results / Procedures / Treatments   Labs (all labs ordered are listed, but only abnormal results are displayed) Labs Reviewed  COMPREHENSIVE METABOLIC PANEL - Abnormal; Notable for the following components:      Result Value   Glucose, Bld 105 (*)    BUN <5 (*)  Creatinine, Ser 0.57 (*)    Calcium 8.4 (*)    AST 45 (*)    Total Bilirubin 1.4 (*)    All other components within normal limits  ETHANOL - Abnormal; Notable for the following components:   Alcohol, Ethyl (B) 313 (*)    All other components within normal limits  CBC WITH DIFFERENTIAL/PLATELET - Abnormal; Notable for the following components:   MCV 100.5 (*)    MCH 34.2 (*)    All other components within normal limits  URINALYSIS, ROUTINE W REFLEX MICROSCOPIC    EKG EKG Interpretation  Date/Time:  Friday September 20 2021 21:16:38 EDT Ventricular Rate:  95 PR Interval:  142 QRS Duration: 92 QT Interval:  352 QTC Calculation: 443 R Axis:   85 Text Interpretation: Sinus rhythm Borderline right axis deviation no acute ST/T changes Confirmed by Sherwood Gambler [phone]) on 09/20/2021 9:18:51 PM  Radiology CT Cervical Spine Wo Contrast  Result Date: 09/20/2021 CLINICAL  DATA:  Trauma EXAM: CT CERVICAL SPINE WITHOUT CONTRAST TECHNIQUE: Multidetector CT imaging of the cervical spine was performed without intravenous contrast. Multiplanar CT image reconstructions were also generated. RADIATION DOSE REDUCTION: This exam was performed according to the departmental dose-optimization program which includes automated exposure control, adjustment of the mA and/or kV according to patient size and/or use of iterative reconstruction technique. COMPARISON:  05/25/2020 FINDINGS: Motion artifacts limit evaluation. Alignment: There is minimal anterolisthesis at C3-C4 and C4-C5 levels. There is minimal retrolisthesis at C5-C6 level. Skull base and vertebrae: No fracture is seen. Soft tissues and spinal canal: There is no central spinal stenosis. Disc levels: There is encroachment of neural foramina from C3 to C7 levels. Upper chest: Unremarkable. Other: No significant interval changes are noted. IMPRESSION: Motion limited study. No recent fracture is seen. Cervical spondylosis with encroachment of neural foramina at multiple levels. Minimal anterolisthesis is seen at C3-C4 and C4-C5 levels and minimal retrolisthesis at C5-C6 level with no significant interval change. Electronically Signed   By: Elmer Picker M.D.   On: 09/20/2021 21:01   CT Head Wo Contrast  Result Date: 09/20/2021 CLINICAL DATA:  Trauma EXAM: CT HEAD WITHOUT CONTRAST TECHNIQUE: Contiguous axial images were obtained from the base of the skull through the vertex without intravenous contrast. RADIATION DOSE REDUCTION: This exam was performed according to the departmental dose-optimization program which includes automated exposure control, adjustment of the mA and/or kV according to patient size and/or use of iterative reconstruction technique. COMPARISON:  05/25/2020 FINDINGS: Brain: No acute intracranial findings are seen. There are no signs of bleeding within the cranium. Cortical sulci are prominent. There is decreased  density in periventricular white matter. Vascular: Unremarkable. Skull: No fracture is seen. There is subcutaneous contusion/hematoma in the right parietal scalp. Sinuses/Orbits: There is mucosal thickening in ethmoid, sphenoid and maxillary sinuses. Other: None. IMPRESSION: No acute intracranial findings are seen. Atrophy. Small-vessel disease. There is subcutaneous contusion/hematoma in the right parietal scalp. No fracture is seen in the calvarium. Chronic sinusitis. Electronically Signed   By: Elmer Picker M.D.   On: 09/20/2021 20:56   DG Chest Portable 1 View  Result Date: 09/20/2021 CLINICAL DATA:  Fall chest pain possible syncope EXAM: PORTABLE CHEST 1 VIEW COMPARISON:  06/14/2020 FINDINGS: The heart size and mediastinal contours are within normal limits. Aortic atherosclerosis. Both lungs are clear. Old appearing bilateral rib fractures. Age indeterminate left fifth posterolateral rib fracture. Age indeterminate left eighth lateral rib fracture. IMPRESSION: No active disease. Age indeterminate left fifth and eighth rib fractures Electronically  Signed   By: Donavan Foil M.D.   On: 09/20/2021 20:33    Procedures .Marland KitchenLaceration Repair  Date/Time: 09/20/2021 10:27 PM  Performed by: Sherwood Gambler, MD Authorized by: Sherwood Gambler, MD   Consent:    Consent obtained:  Verbal   Consent given by:  Patient   Risks, benefits, and alternatives were discussed: yes   Universal protocol:    Patient identity confirmed:  Verbally with patient Anesthesia:    Anesthesia method:  None Laceration details:    Location:  Scalp   Scalp location:  Occipital   Length (cm):  0.5 Pre-procedure details:    Preparation:  Patient was prepped and draped in usual sterile fashion and imaging obtained to evaluate for foreign bodies Exploration:    Contaminated: no   Skin repair:    Repair method:  Staples   Number of staples:  1 Approximation:    Approximation:  Close Repair type:    Repair type:   Simple Post-procedure details:    Dressing:  Open (no dressing)   Procedure completion:  Tolerated well, no immediate complications     Medications Ordered in ED Medications - No data to display  ED Course/ Medical Decision Making/ A&P                           Medical Decision Making Amount and/or Complexity of Data Reviewed Labs: ordered.    Details: EtOH 313 Radiology: ordered and independent interpretation performed.    Details: CT head without acute head bleed ECG/medicine tests: independent interpretation performed.    Details: No acute ischemia, no obvious cause for syncope   Patient presents with altered mental status and head injury.  I suspect this is all related to alcohol intoxication.  He has improved from an intoxication and mental status standpoint throughout his ED stay of a few hours.  Sons are at the bedside and states that he is back to "normal".  No obvious significant head trauma on imaging, personally viewed/interpret these images.  1 staple was placed and hemostasis was achieved.  He wants to leave and his sons feel comfortable taking him home.  Will discharge home with return precautions.        Final Clinical Impression(s) / ED Diagnoses Final diagnoses:  Alcoholic intoxication with complication (HCC)  Occipital scalp laceration, initial encounter    Rx / DC Orders ED Discharge Orders     None         Sherwood Gambler, MD 09/20/21 2303

## 2021-09-20 NOTE — ED Notes (Signed)
Trauma Response Nurse Documentation   Marc Mcdonald is a 66 y.o. male arriving to [name] ED via [facility]  On No antithrombotic. Trauma was activated as a Level 2 by Charge RN based on the following trauma criteria GCS 10-14 associated with trauma or AVPU < A. Trauma team at the bedside on patient arrival. Patient cleared for CT by Dr. Regenia Skeeter. Patient to CT with team. GCS 14.  History   Past Medical History:  Diagnosis Date   Arthritis    Colon cancer ([place])    COPD (chronic obstructive pulmonary disease) ([place])    Duodenal ulcer    Hypertension      Past Surgical History:  Procedure Laterality Date   BALLOON DILATION N/A 05/26/2020   Procedure: BALLOON DILATION;  Surgeon: Lavena Bullion, DO;  Location: [facility];  Service: Endoscopy;  Laterality: N/A;   BIOPSY  05/26/2020   Procedure: BIOPSY;  Surgeon: Lavena Bullion, DO;  Location: [place];  Service: Endoscopy;;   COLONOSCOPY WITH PROPOFOL N/A 05/26/2020   Procedure: COLONOSCOPY WITH PROPOFOL;  Surgeon: Lavena Bullion, DO;  Location: [facility];  Service: Endoscopy;  Laterality: N/A;   COLOSTOMY N/A 05/30/2020   Procedure: COLOSTOMY;  Surgeon: Ileana Roup, MD;  Location: [place];  Service: General;  Laterality: N/A;   ESOPHAGOGASTRODUODENOSCOPY (EGD) WITH PROPOFOL N/A 05/26/2020   Procedure: ESOPHAGOGASTRODUODENOSCOPY (EGD) WITH PROPOFOL;  Surgeon: Lavena Bullion, DO;  Location: [place];  Service: Endoscopy;  Laterality: N/A;   FLEXIBLE SIGMOIDOSCOPY N/A 02/13/2021   Procedure: FLEXIBLE SIGMOIDOSCOPY;  Surgeon: Ileana Roup, MD;  Location: [facility];  Service: General;  Laterality: N/A;   HERNIA REPAIR     PARASTOMAL HERNIA REPAIR N/A 02/13/2021   Procedure: PRIMARY REPAIR CLOSURE OF PARASTOMAL HERNIA, INTRAOPERATIVE ASSESSMENT OF PERFUSION;  Surgeon: Ileana Roup, MD;  Location: [facility];  Service: General;  Laterality: N/A;   SUBMUCOSAL TATTOO INJECTION  05/26/2020    Procedure: SUBMUCOSAL TATTOO INJECTION;  Surgeon: Lavena Bullion, DO;  Location: [place];  Service: Endoscopy;;   XI ROBOTIC ASSISTED COLOSTOMY TAKEDOWN N/A 02/13/2021   Procedure: XI ROBOTIC ASSISTED COLOSTOMY TAKEDOWN, TAP BLOCK;  Surgeon: Ileana Roup, MD;  Location: [facility];  Service: General;  Laterality: N/A;   XI ROBOTIC ASSISTED LOWER ANTERIOR RESECTION N/A 05/30/2020   Procedure: XI ROBOTIC ASSISTED LOWER ANTERIOR RESECTION WITH END COLOSTOMY CREATION;  Surgeon: Ileana Roup, MD;  Location: [place];  Service: General;  Laterality: N/A;   XI ROBOTIC ASSISTED VENTRAL HERNIA N/A 07/15/2021   Procedure: XI ROBOTIC [employer];  Surgeon: Felicie Morn, MD;  Location: [facility];  Service: General;  Laterality: N/A;       Initial Focused Assessment (If applicable, or please see trauma documentation): See event summary.  CT's Completed:   CT Head and CT C-Spine   Interventions:  See event summary. Plan for disposition:  Unknown at this time.  Consults completed:  none at 2039.  Event Summary: Patient brought in by [facility], patient was ambulating up some stairs and fell, striking his head. ETOH on board. EMS reports repetitive questioning. Upon arrival, patient agitated, cussing at staff members. Patient transferred to hospital bed, c-collar replaced with [facility] by ED staff. Trauma labs obtained. Portable xray obtained. Patient to and from CT with TRN.   Bedside handoff with ED RN Mykenzie.    Trudee Kuster  Trauma Response RN  Please call TRN at [phone]  for further assistance.

## 2021-09-20 NOTE — Progress Notes (Signed)
Orthopedic Tech Progress Note Patient Details:  AVI KERSCHNER 07/14/1955 [id number]  Patient ID: [name], male   DOB: February 13, 1956, 66 y.o.   MRN: [id number] I attended trauma page Karolee Stamps 09/20/2021, 8:37 PM

## 2021-09-20 NOTE — Discharge Instructions (Addendum)
1 staple was placed in the back your scalp.  Your primary care physician can take this out next week, ideally in 5-7 days  If he develops severe headache, confusion, vomiting, or any other new/concerning symptoms then return to the ER for evaluation.

## 2021-09-20 NOTE — ED Triage Notes (Signed)
Pt had multiple witnessed falls with family. Abrasion to posterior head. Repetitive questioning. Heavy etoh

## 2021-09-27 ENCOUNTER — Ambulatory Visit (INDEPENDENT_AMBULATORY_CARE_PROVIDER_SITE_OTHER): Payer: Medicare Other | Admitting: Family Medicine

## 2021-09-27 ENCOUNTER — Encounter (HOSPITAL_BASED_OUTPATIENT_CLINIC_OR_DEPARTMENT_OTHER): Payer: Self-pay | Admitting: Family Medicine

## 2021-09-27 DIAGNOSIS — S0101XA Laceration without foreign body of scalp, initial encounter: Secondary | ICD-10-CM | POA: Diagnosis not present

## 2021-09-27 NOTE — Patient Instructions (Signed)
  Medication Instructions:  Your physician recommends that you continue on your current medications as directed. Please refer to the Current Medication list given to you today. --If you need a refill on any your medications before your next appointment, please call your pharmacy first. If no refills are authorized on file call the office.-- Lab Work: Your physician has recommended that you have lab work today: No If you have labs (blood work) drawn today and your tests are completely normal, you will receive your results via MyChart message OR a phone call from our staff.  Please ensure you check your voicemail in the event that you authorized detailed messages to be left on a delegated number. If you have any lab test that is abnormal or we need to change your treatment, we will call you to review the results.  Referrals/Procedures/Imaging: No  Follow-Up: Your next appointment:   Your physician recommends that you schedule a follow-up appointment as needed with Dr. de [place].  You will receive a text message or e-mail with a link to a survey about your care and experience with us today! We would greatly appreciate your feedback!   Thanks for letting us be apart of your health journey!!  Primary Care and Sports Medicine   Dr. Raymond de [place]   We encourage you to activate your patient portal called "MyChart".  Sign up information is provided on this After Visit Summary.  MyChart is used to connect with patients for Virtual Visits (Telemedicine).  Patients are able to view lab/test results, encounter notes, upcoming appointments, etc.  Non-urgent messages can be sent to your provider as well. To learn more about what you can do with MyChart, please visit --  [email or web address].    

## 2021-09-27 NOTE — Progress Notes (Signed)
    Procedures performed today:    None.  Independent interpretation of notes and tests performed by another provider:   None.  Brief History, Exam, Impression, and Recommendations:    BP (!) 150/92   Pulse (!) 115   Ht '5\' 8"'$  (1.727 m)   Wt 135 lb 9.6 oz (61.5 kg)   SpO2 100%   BMI 20.62 kg/m   Scalp laceration Patient with laceration over posterior scalp, occipital region.  Laceration noted on right side.  He was seen at the emergency department recently due to acute alcohol intoxication and had fall with resulting scalp laceration.  In the emergency department, he did have staples placed for laceration closure.  Since that time he denies having any issues with the laceration.  No increased pain, bleeding, or oozing.  It has been 1 week since staple was placed.  He is requesting removal of the staple today if indicated. On exam, patient does have 1 staple in place over posterior scalp.  Does have some slight crusting around laceration.  No erythema, drainage, or oozing. In the office today, laceration does appear to be healing well.  Procedure was staple removal today without complication.  No bleeding or oozing present after removal stable.  Cautioned patient on potential signs of infection such as increased pain, swelling, drainage.  Be mindful of any systemic symptoms such as fevers, chills, sweats. Discussed general wound hygiene measures  Given recent alcohol intoxication which led to fall and resulting laceration, did discuss with patient today importance of limiting alcohol intake and potential risks associated with chronic alcohol use.  Patient voiced understanding.  He has been working to limit daily alcohol consumption.  Currently is limiting to 3 alcoholic drinks/beers per day.  Discussed recommendation for continued weaning from this daily amount.  Patient voiced understanding.  Return if symptoms worsen or fail to improve.  Plan for follow-up at previously scheduled  appointment which is in about 2 months   ___________________________________________ Carron Mcmurry de [place], MD, ABFM, [facility] Primary Care and [place]

## 2021-09-27 NOTE — Assessment & Plan Note (Signed)
Patient with laceration over posterior scalp, occipital region.  Laceration noted on right side.  He was seen at the emergency department recently due to acute alcohol intoxication and had fall with resulting scalp laceration.  In the emergency department, he did have staples placed for laceration closure.  Since that time he denies having any issues with the laceration.  No increased pain, bleeding, or oozing.  It has been 1 week since staple was placed.  He is requesting removal of the staple today if indicated. On exam, patient does have 1 staple in place over posterior scalp.  Does have some slight crusting around laceration.  No erythema, drainage, or oozing. In the office today, laceration does appear to be healing well.  Procedure was staple removal today without complication.  No bleeding or oozing present after removal stable.  Cautioned patient on potential signs of infection such as increased pain, swelling, drainage.  Be mindful of any systemic symptoms such as fevers, chills, sweats. Discussed general wound hygiene measures

## 2021-10-05 ENCOUNTER — Other Ambulatory Visit (HOSPITAL_BASED_OUTPATIENT_CLINIC_OR_DEPARTMENT_OTHER): Payer: Self-pay | Admitting: Family Medicine

## 2021-11-19 ENCOUNTER — Ambulatory Visit (INDEPENDENT_AMBULATORY_CARE_PROVIDER_SITE_OTHER): Payer: Medicare Other | Admitting: Family Medicine

## 2021-11-19 ENCOUNTER — Encounter (HOSPITAL_BASED_OUTPATIENT_CLINIC_OR_DEPARTMENT_OTHER): Payer: Self-pay | Admitting: Family Medicine

## 2021-11-19 VITALS — BP 134/82 | HR 98 | Ht 68.0 in | Wt 136.0 lb

## 2021-11-19 DIAGNOSIS — H9313 Tinnitus, bilateral: Secondary | ICD-10-CM | POA: Diagnosis not present

## 2021-11-19 DIAGNOSIS — H9319 Tinnitus, unspecified ear: Secondary | ICD-10-CM | POA: Insufficient documentation

## 2021-11-19 DIAGNOSIS — H9193 Unspecified hearing loss, bilateral: Secondary | ICD-10-CM

## 2021-11-19 DIAGNOSIS — I10 Essential (primary) hypertension: Secondary | ICD-10-CM | POA: Diagnosis not present

## 2021-11-19 NOTE — Assessment & Plan Note (Signed)
Blood pressure initially elevated during office visit, recheck is improved and shows better control.  He is not currently checking his blood pressure at home.  He does report continuing with prescribed medications including amlodipine.  No current issues with chest pain or headaches At this time, would recommend continuing with current medication regimen.  Recommend monitoring blood pressure at home, DASH diet

## 2021-11-19 NOTE — Progress Notes (Signed)
    Procedures performed today:    None.  Independent interpretation of notes and tests performed by another provider:   None.  Brief History, Exam, Impression, and Recommendations:    BP 134/82   Pulse 98   Ht '5\' 8"'$  (1.727 m)   Wt 136 lb (61.7 kg)   SpO2 100%   BMI 20.68 kg/m   Hypertension Blood pressure initially elevated during office visit, recheck is improved and shows better control.  He is not currently checking his blood pressure at home.  He does report continuing with prescribed medications including amlodipine.  No current issues with chest pain or headaches At this time, would recommend continuing with current medication regimen.  Recommend monitoring blood pressure at home, DASH diet  Tinnitus Patient reports that he has been having ringing in both ears follow-up with many years now.  He does report having prior evaluation at least 5 years ago and was told at that time that he would benefit from hearing aids.  Since then, he has continued to have issues with ringing in his ears.  He reports that this is about equal in both ears.  Has not had any associated pain or other new symptoms.  He has had some corresponding hearing loss over the years as well On exam, external auditory canals are clear, tympanic membranes are normal in appearance with no bulging or other abnormality identified. Discussed options with patient today, he is amenable to evaluation with audiologist, referral placed today  He reports that he does have upcoming visit with surgeon next week.  He does have some concerns regarding surgical sites and possible hernia.  Advised that he discuss this further with his surgeon at follow-up visit  Return in about 2 months (around 01/19/2022) for HTN.   ___________________________________________ Florencia Zaccaro de [place], MD, ABFM, [facility] Primary Care and [place]

## 2021-11-19 NOTE — Patient Instructions (Signed)
  Medication Instructions:  Your physician recommends that you continue on your current medications as directed. Please refer to the Current Medication list given to you today. --If you need a refill on any your medications before your next appointment, please call your pharmacy first. If no refills are authorized on file call the office.-- Lab Work: Your physician has recommended that you have lab work today: No If you have labs (blood work) drawn today and your tests are completely normal, you will receive your results via [place] a phone call from our staff.  Please ensure you check your voicemail in the event that you authorized detailed messages to be left on a delegated number. If you have any lab test that is abnormal or we need to change your treatment, we will call you to review the results.  Referrals/Procedures/Imaging: No  Follow-Up: Your next appointment:   Your physician recommends that you schedule a follow-up appointment in: 2 months follow-up with Dr. de [place].  You will receive a text message or e-mail with a link to a survey about your care and experience with [place] today! We would greatly appreciate your feedback!   Thanks for letting us be apart of your health journey!!  Primary Care and Sports Medicine   Dr. Arlina Robes [place]   We encourage you to activate your patient portal called "MyChart".  Sign up information is provided on this After Visit Summary.  MyChart is used to connect with patients for Virtual Visits (Telemedicine).  Patients are able to view lab/test results, encounter notes, upcoming appointments, etc.  Non-urgent messages can be sent to your provider as well. To learn more about what you can do with MyChart, please visit --  [email or web address].

## 2021-11-19 NOTE — Assessment & Plan Note (Signed)
Patient reports that he has been having ringing in both ears follow-up with many years now.  He does report having prior evaluation at least 5 years ago and was told at that time that he would benefit from hearing aids.  Since then, he has continued to have issues with ringing in his ears.  He reports that this is about equal in both ears.  Has not had any associated pain or other new symptoms.  He has had some corresponding hearing loss over the years as well On exam, external auditory canals are clear, tympanic membranes are normal in appearance with no bulging or other abnormality identified. Discussed options with patient today, he is amenable to evaluation with audiologist, referral placed today

## 2022-01-07 ENCOUNTER — Encounter: Payer: Self-pay | Admitting: Physician Assistant

## 2022-01-09 ENCOUNTER — Other Ambulatory Visit (HOSPITAL_BASED_OUTPATIENT_CLINIC_OR_DEPARTMENT_OTHER): Payer: Self-pay | Admitting: Family Medicine

## 2022-01-14 ENCOUNTER — Other Ambulatory Visit (HOSPITAL_BASED_OUTPATIENT_CLINIC_OR_DEPARTMENT_OTHER): Payer: Self-pay | Admitting: Family Medicine

## 2022-01-23 ENCOUNTER — Emergency Department (HOSPITAL_COMMUNITY)
Admission: EM | Admit: 2022-01-23 | Discharge: 2022-01-23 | Disposition: A | Discharge status: Home / Self Care | Payer: Medicare Other | Attending: Emergency Medicine | Admitting: Emergency Medicine

## 2022-01-23 ENCOUNTER — Emergency Department (HOSPITAL_COMMUNITY): Payer: Medicare Other

## 2022-01-23 DIAGNOSIS — Z85038 Personal history of other malignant neoplasm of large intestine: Secondary | ICD-10-CM | POA: Insufficient documentation

## 2022-01-23 DIAGNOSIS — S0990XA Unspecified injury of head, initial encounter: Secondary | ICD-10-CM | POA: Diagnosis present

## 2022-01-23 DIAGNOSIS — F10920 Alcohol use, unspecified with intoxication, uncomplicated: Secondary | ICD-10-CM

## 2022-01-23 DIAGNOSIS — Y908 Blood alcohol level of 240 mg/100 ml or more: Secondary | ICD-10-CM | POA: Diagnosis not present

## 2022-01-23 DIAGNOSIS — S0101XA Laceration without foreign body of scalp, initial encounter: Secondary | ICD-10-CM | POA: Diagnosis not present

## 2022-01-23 DIAGNOSIS — W19XXXA Unspecified fall, initial encounter: Secondary | ICD-10-CM

## 2022-01-23 DIAGNOSIS — I10 Essential (primary) hypertension: Secondary | ICD-10-CM | POA: Diagnosis not present

## 2022-01-23 DIAGNOSIS — M47812 Spondylosis without myelopathy or radiculopathy, cervical region: Secondary | ICD-10-CM | POA: Insufficient documentation

## 2022-01-23 DIAGNOSIS — W01190A Fall on same level from slipping, tripping and stumbling with subsequent striking against furniture, initial encounter: Secondary | ICD-10-CM | POA: Diagnosis not present

## 2022-01-23 LAB — ETHANOL: Alcohol, Ethyl (B): 246 mg/dL — ABNORMAL HIGH (ref <10)

## 2022-01-23 LAB — CBC WITH DIFFERENTIAL/PLATELET
Abs Immature Granulocytes: 0.04 10*3/uL (ref 0.00–0.07)
Basophils Absolute: 0.1 10*3/uL (ref 0.0–0.1)
Basophils Relative: 1 %
Eosinophils Absolute: 0.1 10*3/uL (ref 0.0–0.5)
Eosinophils Relative: 1 %
HCT: 44.1 % (ref 39.0–52.0)
Hemoglobin: 15.2 g/dL (ref 13.0–17.0)
Immature Granulocytes: 0 %
Lymphocytes Relative: 12 %
Lymphs Abs: 1.2 10*3/uL (ref 0.7–4.0)
MCH: 35.7 pg — ABNORMAL HIGH (ref 26.0–34.0)
MCHC: 34.5 g/dL (ref 30.0–36.0)
MCV: 103.5 fL — ABNORMAL HIGH (ref 80.0–100.0)
Monocytes Absolute: 0.7 10*3/uL (ref 0.1–1.0)
Monocytes Relative: 7 %
Neutro Abs: 8 10*3/uL — ABNORMAL HIGH (ref 1.7–7.7)
Neutrophils Relative %: 79 %
Platelets: 243 10*3/uL (ref 150–400)
RBC: 4.26 MIL/uL (ref 4.22–5.81)
RDW: 12.5 % (ref 11.5–15.5)
WBC: 10.1 10*3/uL (ref 4.0–10.5)
nRBC: 0 % (ref 0.0–0.2)

## 2022-01-23 LAB — BASIC METABOLIC PANEL
Anion gap: 10 (ref 5–15)
BUN: 5 mg/dL — ABNORMAL LOW (ref 8–23)
CO2: 22 mmol/L (ref 22–32)
Calcium: 8.6 mg/dL — ABNORMAL LOW (ref 8.9–10.3)
Chloride: 107 mmol/L (ref 98–111)
Creatinine, Ser: 0.57 mg/dL — ABNORMAL LOW (ref 0.61–1.24)
GFR, Estimated: >60 mL/min (ref >60)
Glucose, Bld: 103 mg/dL — ABNORMAL HIGH (ref 70–99)
Potassium: 3.7 mmol/L (ref 3.5–5.1)
Sodium: 139 mmol/L (ref 135–145)

## 2022-01-23 MED ORDER — ACETAMINOPHEN 325 MG PO TABS
650.0000 mg | ORAL_TABLET | Freq: Once | ORAL | Status: AC
  Administered 2022-01-23: 650 mg via ORAL
  Filled 2022-01-23: qty 2

## 2022-01-23 NOTE — ED Provider Notes (Signed)
[facility] EMERGENCY DEPARTMENT Provider Note   CSN: [id number] Arrival date & time: 01/23/22  1841     History  Chief Complaint  Patient presents with   Marc Mcdonald is a 66 y.o. male history of prior colon cancer s/p ostomy takedown here for evaluation of trip and fall.  Patient brought in by EMS.  States he has been drinking alcohol and tripped and fell and hit the back of his head on the chair.  He denies any syncope.  No headache, nausea, vomiting, chest pain, shortness of breath, abdominal pain. He refuses to wear c-collar.  He denies any anticoagulation.  States his tetanus is up to date  HPI     Home Medications Prior to Admission medications   Medication Sig Start Date End Date Taking? Authorizing Provider  acetaminophen (TYLENOL) 500 MG tablet Take 1,000 mg by mouth every 6 (six) hours as needed for mild pain or moderate pain.    [provider]  amLODipine (NORVASC) 5 MG tablet TAKE 1 TABLET (5 MG TOTAL) BY MOUTH DAILY. 01/14/22   de [place], Raymond J, MD  pantoprazole (PROTONIX) 40 MG tablet TAKE 1 TABLET BY MOUTH EVERY DAY 01/09/22   [place], Raymond J, MD  Pediatric Multiple Vitamins (CHILDRENS MULTIVITAMIN) chewable tablet Chew 1 tablet by mouth daily. With iron    [provider]  traZODone (DESYREL) 50 MG tablet Take 1 tablet (50 mg total) by mouth at bedtime as needed. for sleep 07/19/21   [place], Raymond J, MD      Allergies    Patient has no known allergies.    Review of Systems   Review of Systems  Unable to perform ROS: Mental status change  Constitutional: Negative.   HENT: Negative.    Respiratory: Negative.    Cardiovascular: Negative.   Genitourinary: Negative.   Musculoskeletal: Negative.   Skin:  Positive for wound.  Neurological: Negative.   All other systems reviewed and are negative.   Physical Exam Updated Vital Signs BP 117/76   Pulse (!) 102   Temp 98.9 F (37.2 C)   Resp 18    SpO2 96%  Physical Exam Vitals and nursing note reviewed.  Constitutional:      General: He is not in acute distress.    Appearance: He is well-developed. He is not ill-appearing, toxic-appearing or diaphoretic.     Comments: Appears intoxicated  HENT:     Head: Normocephalic. Laceration present. No raccoon eyes, Battle's sign, contusion, masses, right periorbital erythema or left periorbital erythema. Hair is normal.     Jaw: There is normal jaw occlusion.      Comments: Large amount of dried, matted blood to posterior scalp. No pulsatile bleeding 6 cm lac to scalp, superficial. No exposed galea    Ears:     Comments: No hemotympanum  Eyes:     Pupils: Pupils are equal, round, and reactive to light.  Neck:     Comments: No midline tenderness, Full ROM, Refused Ccollar Cardiovascular:     Rate and Rhythm: Normal rate and regular rhythm.     Pulses: Normal pulses.     Heart sounds: Normal heart sounds.  Pulmonary:     Effort: Pulmonary effort is normal. No respiratory distress.     Breath sounds: Normal breath sounds.  Abdominal:     General: Bowel sounds are normal. There is no distension.     Palpations: Abdomen is soft.  Comments: Large incision left lower abdomen, nontender.  Hernia present  Musculoskeletal:        General: Normal range of motion.     Cervical back: Normal range of motion and neck supple.     Comments: Midline C/C/L tenderness.  No bony tenderness bilateral upper and lower extremities.  Full range of motion without difficulty.  Skin:    General: Skin is warm and dry.  Neurological:     Mental Status: He is alert.     Comments: Intoxicated however follows commands.  Alert to person, place, time.  Equal grip.  Moves all 4 extremities.     ED Results / Procedures / Treatments   Labs (all labs ordered are listed, but only abnormal results are displayed) Labs Reviewed  CBC WITH DIFFERENTIAL/PLATELET - Abnormal; Notable for the following components:       Result Value   MCV 103.5 (*)    MCH 35.7 (*)    Neutro Abs 8.0 (*)    All other components within normal limits  BASIC METABOLIC PANEL - Abnormal; Notable for the following components:   Glucose, Bld 103 (*)    BUN 5 (*)    Creatinine, Ser 0.57 (*)    Calcium 8.6 (*)    All other components within normal limits  ETHANOL - Abnormal; Notable for the following components:   Alcohol, Ethyl (B) 246 (*)    All other components within normal limits    EKG None  Radiology CT Cervical Spine Wo Contrast  Result Date: 01/23/2022 CLINICAL DATA:  Neck trauma from fall EXAM: CT CERVICAL SPINE WITHOUT CONTRAST TECHNIQUE: Multidetector CT imaging of the cervical spine was performed without intravenous contrast. Multiplanar CT image reconstructions were also generated. RADIATION DOSE REDUCTION: This exam was performed according to the departmental dose-optimization program which includes automated exposure control, adjustment of the mA and/or kV according to patient size and/or use of iterative reconstruction technique. COMPARISON:  CT cervical spine 09/20/2021 FINDINGS: Images are mildly degraded by motion. Alignment: No traumatic malalignment. Unchanged slight anterolisthesis C3 and retrolisthesis C5. Skull base and vertebrae: No acute fracture. No primary bone lesion or focal pathologic process. Soft tissues and spinal canal: No prevertebral fluid or swelling. No visible canal hematoma. Disc levels: Multilevel spondylosis and disc space height loss with degenerative endplate changes greatest at C5-C6 and C6-C7 where it is moderate-advanced. Advanced cervical facet arthropathy on the left at C2-C5 with partial ankylosis. No high-grade spinal canal narrowing. There is advanced neural foraminal narrowing on the left at C4-C5 and on the left at C5-C6 secondary to uncovertebral spurring and facet arthropathy. Upper chest: Emphysema. Other: Carotid bulb calcifications. IMPRESSION: No acute fracture in the  cervical spine. Electronically Signed   By: Placido Sou M.D.   On: 01/23/2022 20:48   CT HEAD WO CONTRAST (5MM)  Result Date: 01/23/2022 CLINICAL DATA:  Head trauma EXAM: CT HEAD WITHOUT CONTRAST TECHNIQUE: Contiguous axial images were obtained from the base of the skull through the vertex without intravenous contrast. RADIATION DOSE REDUCTION: This exam was performed according to the departmental dose-optimization program which includes automated exposure control, adjustment of the mA and/or kV according to patient size and/or use of iterative reconstruction technique. COMPARISON:  09/20/2021 FINDINGS: Brain: There is no mass, hemorrhage or extra-axial collection. The size and configuration of the ventricles and extra-axial CSF spaces are normal. The brain parenchyma is normal, without acute or chronic infarction. Vascular: No abnormal hyperdensity of the major intracranial arteries or dural venous sinuses. No  intracranial atherosclerosis. Skull: The visualized skull base, calvarium and extracranial soft tissues are normal. Sinuses/Orbits: No fluid levels or advanced mucosal thickening of the visualized paranasal sinuses. No mastoid or middle ear effusion. The orbits are normal. IMPRESSION: Normal head CT. Electronically Signed   By: Ulyses Jarred M.D.   On: 01/23/2022 20:21   DG Chest 2 View  Result Date: 01/23/2022 CLINICAL DATA:  Status post unwitnessed fall EXAM: CHEST - 2 VIEW COMPARISON:  Radiographs 09/20/2021 FINDINGS: Stable cardiomediastinal silhouette. Aortic atherosclerotic calcification. No focal consolidation, pleural effusion, or pneumothorax. No acute osseous abnormality. Remote bilateral lower rib fractures. IMPRESSION: No acute abnormality. Electronically Signed   By: Placido Sou M.D.   On: 01/23/2022 19:54    Procedures .Marland KitchenLaceration Repair  Date/Time: 01/23/2022 9:31 PM  Performed by: Shelby Dubin A, PA-C Authorized by: Nettie Elm, PA-C   Consent:     Consent obtained:  Verbal   Consent given by:  Patient   Risks, benefits, and alternatives were discussed: yes     Risks discussed:  Infection, pain, retained foreign body, tendon damage, vascular damage, poor wound healing, poor cosmetic result, need for additional repair and nerve damage   Alternatives discussed:  No treatment, delayed treatment, observation and referral Universal protocol:    Procedure explained and questions answered to patient or proxy's satisfaction: yes     Relevant documents present and verified: yes     Test results available: yes     Imaging studies available: yes     Required blood products, implants, devices, and special equipment available: yes     Site/side marked: yes     Immediately prior to procedure, a time out was called: yes     Patient identity confirmed:  Verbally with patient Anesthesia:    Anesthesia method:  Topical application Laceration details:    Location:  Scalp   Scalp location:  Crown   Length (cm):  6   Depth (mm):  3 Pre-procedure details:    Preparation:  Patient was prepped and draped in usual sterile fashion and imaging obtained to evaluate for foreign bodies Exploration:    Limited defect created (wound extended): no     Hemostasis achieved with:  Direct pressure   Imaging obtained comment:  CT   Imaging outcome: foreign body not noted     Wound exploration: wound explored through full range of motion and entire depth of wound visualized     Wound extent: fascia not violated, no foreign body, no signs of injury, no tendon damage, no underlying fracture and no vascular damage   Treatment:    Area cleansed with:  Povidone-iodine   Amount of cleaning:  Extensive   Irrigation solution:  Sterile saline   Irrigation method:  Pressure wash Skin repair:    Repair method:  Staples   Number of staples:  7 Approximation:    Approximation:  Close Repair type:    Repair type:  Intermediate Post-procedure details:    Dressing:  Open  (no dressing)   Procedure completion:  Tolerated well, no immediate complications     Medications Ordered in ED Medications  acetaminophen (TYLENOL) tablet 650 mg (has no administration in time range)    ED Course/ Medical Decision Making/ A&P     66 year old history of hypertension, prior colon cancer s/p ostomy takedown here for evaluation of fall.  Patient states had a mechanical fall.  He admits to drinking alcohol.  States he has had the back of the chair.  On  arrival patient appears intoxicated however alert to person, place, time.  Follows commands.  Moves all 4 extremities.  No midline C/T/L tenderness.  Cannot clear C-spine with Nexus rule due to intoxication however he refuses c-collar.  He has large amount of matted blood to superior and posterior scalp.  No pulsatile bleeding.  We will plan on check labs, imaging clean wounds and reassess.  Labs and imaging personally viewed and interpreted:  CBC without leukocytosis Metabolic panel without significant abnormality Ethanol 246 Chest x-ray without infiltrates, cardiomegaly, pulm edema, rib fx CT head without acute abnormality Ct cervical without acute abnormality  Patient reassessed. Family at bedside. States pt was drinking ETOH, got up from chair and tripped falling backwards.  Discussed labs and imaging with patient, family in room.  We will plan on stapling his head.  Procedure note, #7 staples placed  Encourage cessation of alcohol.  Patient ambulatory, tolerating p.o. intake.  Family agreeable to take patient home.  Follow-up with PCP for suture removal in 1 week  The patient has been appropriately medically screened and/or stabilized in the ED. I have low suspicion for any other emergent medical condition which would require further screening, evaluation or treatment in the ED or require inpatient management.  Patient is hemodynamically stable and in no acute distress.  Patient able to ambulate in department prior  to ED.  Evaluation does not show acute pathology that would require ongoing or additional emergent interventions while in the emergency department or further inpatient treatment.  I have discussed the diagnosis with the patient and answered all questions.  Pain is been managed while in the emergency department and patient has no further complaints prior to discharge.  Patient is comfortable with plan discussed in room and is stable for discharge at this time.  I have discussed strict return precautions for returning to the emergency department.  Patient was encouraged to follow-up with PCP/specialist refer to at discharge.                            Medical Decision Making Amount and/or Complexity of Data Reviewed Independent Historian: EMS    Details: EMS, family External Data Reviewed: labs, radiology and notes. Labs: ordered. Decision-making details documented in ED Course. Radiology: ordered and independent interpretation performed. Decision-making details documented in ED Course.  Risk OTC drugs. Prescription drug management. Decision regarding hospitalization. Diagnosis or treatment significantly limited by social determinants of health.          Final Clinical Impression(s) / ED Diagnoses Final diagnoses:  Fall, initial encounter  Laceration of scalp, initial encounter  Alcoholic intoxication without complication [facility])    Rx / DC Orders ED Discharge Orders     None         Jaelyn Bourgoin A, PA-C 01/23/22 2211    Fredia Sorrow, MD 01/25/22 0002

## 2022-01-23 NOTE — ED Notes (Signed)
Patient discharge home with son Patient and son deny any needs at this time. Patient wheeled out with family.

## 2022-01-23 NOTE — Discharge Instructions (Signed)
#  7 staples in your head.  These need to be removed in about 1 week.  May have these removed at primary care office, urgent care  Recommend cutting down on alcohol to avoid any further falls  Return for new or worsening symptoms

## 2022-01-23 NOTE — ED Triage Notes (Signed)
[name] after a fall at home. Pt had an unwitnessed fall with denial from pt of LOC. Per pt "hit head on chair" EMS on scene saw roughly 50 cc of blood on fireplace. Pt ripped C-collar off with EMS. No hx of thinners  4 in lac by 1 cm wide per Fire who was first on scene.  + ETOH

## 2022-01-24 ENCOUNTER — Ambulatory Visit (HOSPITAL_BASED_OUTPATIENT_CLINIC_OR_DEPARTMENT_OTHER): Payer: Medicare Other | Admitting: Family Medicine

## 2022-01-30 ENCOUNTER — Ambulatory Visit (HOSPITAL_BASED_OUTPATIENT_CLINIC_OR_DEPARTMENT_OTHER): Payer: Medicare Other | Admitting: Family Medicine

## 2022-01-30 VITALS — BP 149/82 | HR 90 | Temp 97.8°F | Ht 68.0 in | Wt 139.9 lb

## 2022-01-30 DIAGNOSIS — S0101XA Laceration without foreign body of scalp, initial encounter: Secondary | ICD-10-CM | POA: Diagnosis not present

## 2022-01-30 NOTE — Progress Notes (Signed)
    Procedures performed today:    None.  Independent interpretation of notes and tests performed by another provider:   None.  Brief History, Exam, Impression, and Recommendations:    BP (!) 149/82 (BP Location: Right Arm, Patient Position: Sitting, Cuff Size: Normal)   Pulse 90   Temp 97.8 F (36.6 C) (Oral)   Ht '5\' 8"'$  (1.727 m)   Wt 139 lb 14.4 oz (63.5 kg)   SpO2 100%   BMI 21.27 kg/m   Scalp laceration Patient presents with scalp laceration which was repaired in ED utilizing staples about 1 week ago.  Patient had acute alcohol intoxication and had fall which resulted in scalp laceration.  Staples utilized for closure in the emergency department.  He denies any issues with the laceration since that time, no significant pain, bleeding, oozing.  He is requesting to have staples removed today. On exam, patient has 7 staples in place but some dried blood in the area, this is scant.  No significant tenderness to palpation, no erythema, no active drainage. Laceration does appear to be healed well and thus staple removal was completed today.  Staples were removed without issue, no bleeding or oozing present after removal.  Cautioned on potential signs of infection, discussed general wound hygiene measures. Also reviewed recommendations for alcohol cessation with patient.  He does have goal of quitting alcohol but next office visit in January.  He has already greatly reduced alcohol consumption over the past week  Return if symptoms worsen or fail to improve.   ___________________________________________ Linell Shawn de [place], MD, ABFM, CAQSM [facility] and [place]

## 2022-01-30 NOTE — Assessment & Plan Note (Signed)
Patient presents with scalp laceration which was repaired in ED utilizing staples about 1 week ago.  Patient had acute alcohol intoxication and had fall which resulted in scalp laceration.  Staples utilized for closure in the emergency department.  He denies any issues with the laceration since that time, no significant pain, bleeding, oozing.  He is requesting to have staples removed today. On exam, patient has 7 staples in place but some dried blood in the area, this is scant.  No significant tenderness to palpation, no erythema, no active drainage. Laceration does appear to be healed well and thus staple removal was completed today.  Staples were removed without issue, no bleeding or oozing present after removal.  Cautioned on potential signs of infection, discussed general wound hygiene measures. Also reviewed recommendations for alcohol cessation with patient.  He does have goal of quitting alcohol but next office visit in January.  He has already greatly reduced alcohol consumption over the past week

## 2022-01-30 NOTE — Patient Instructions (Signed)
  Medication Instructions:  Your physician recommends that you continue on your current medications as directed. Please refer to the Current Medication list given to you today. --If you need a refill on any your medications before your next appointment, please call your pharmacy first. If no refills are authorized on file call the office.-- Lab Work: Your physician has recommended that you have lab work today: No If you have labs (blood work) drawn today and your tests are completely normal, you will receive your results via [place] a phone call from our staff.  Please ensure you check your voicemail in the event that you authorized detailed messages to be left on a delegated number. If you have any lab test that is abnormal or we need to change your treatment, we will call you to review the results.  Referrals/Procedures/Imaging: No  Follow-Up: Your next appointment:   Your physician recommends that you schedule a follow-up appointment already scheduled with Dr. de [place].  You will receive a text message or e-mail with a link to a survey about your care and experience with [place] today! We would greatly appreciate your feedback!   Thanks for letting us be apart of your health journey!!  Primary Care and Sports Medicine   Dr. Arlina Robes [place]   We encourage you to activate your patient portal called "MyChart".  Sign up information is provided on this After Visit Summary.  MyChart is used to connect with patients for Virtual Visits (Telemedicine).  Patients are able to view lab/test results, encounter notes, upcoming appointments, etc.  Non-urgent messages can be sent to your provider as well. To learn more about what you can do with MyChart, please visit --  [email or web address].

## 2022-02-10 NOTE — Progress Notes (Unsigned)
02/11/2022 Marc Mcdonald [id number] November 16, 1955  Referring provider: de [place], Raymond J, MD Primary GI doctor: Dr. Lyndel Safe  ASSESSMENT AND PLAN:   Colorectal adenocarcinoma stage IIa status post robotic assisted lower anterior resection with Dr. Dema Severin 05/2020. S/P colostomy takedown 01/2021 05/2021 CT chest abdomen pelvis without evidence of mets. Will repeat colonoscopy for surveillance purposes. Patient appears appropriate for [facility], scheduled with Dr. Lyndel Safe. We have discussed the risks of bleeding, infection, perforation, medication reactions, and remote risk of death associated with colonoscopy. All questions were answered and the patient acknowledges these risk and wishes to proceed.  Duodenal ulcer on EGD 05/2020 with 5 cm hiatal hernia and grade B esophagitis. No NSAIDs, patient continues to drink 3-4 beers daily. Some dysphagia with large pills only. Discussed with the patient and with previous duodenal ulcer history, continuing alcohol use will plan for EGD at the time of colonoscopy to evaluate for varices, esophagitis, gastritis, peptic ulcer disease. Counseled on alcohol cessation, no NSAIDs. Continue Protonix once daily for now. I discussed risks of EGD with patient today, including risk of sedation, bleeding or perforation. Patient provides understanding and gave verbal consent to proceed.  Alcohol abuse with history of fatty liver 09/20/2021 AST 45, ALT 21, alk phos 77 01/23/2022 MCV 103, Hgb 15.2 06/27/2021 MRI liver with and without contrast due to abnormal CT chest abdomen and pelvis with contrast showed a focal fatty infiltration of liver no suspicious hepatic lesions, no cirrhosis, no splenomegaly. Should get liver function every 6 months, consider ultrasound elastography next visit.  Left supraclavicular fullness, no appreciated lymph node.   05/2021 CT chest abdomen pelvis with contrast showed emphysema, scarring, but no metastasis or nodules. 01/23/2022 chest  x-ray with fall no consolidations, no pleural effusion, no abnormality. Follow-up PCP.  History of Present Illness:  66 y.o. male  with a past medical history of COPD, alcohol use disorder, reflux esophagitis, duodenal ulcers, IDA, Colo rectal adenocarcinoma stage IIa status post robotic assisted lower anterior resection with Dr. Dema Severin 05/2020, postoperative ileus, no evidence of metastasis CT  05/2021 colostomy takedown with stapled colorectal anastomosis 02/13/21.Marland Kitchenand others listed below, returns to clinic today for evaluation of colonoscopy.  08/22/2020 last seen in the office by [facility] NP, at that time patient still had colostomy from previous colorectal surgery for adenocarcinoma.  He also had duodenal ulcers possibly from alcohol, declined repeat endoscopy which is recommended to evaluate for healing. Patient lost to follow-up until today at our office but has been following with Dr. Dema Severin.  Declined follow-up with Dr. Burr Medico.. 02/13/2021 colostomy takedown with Dr. Dema Severin. 07/15/2021 robotic incisional hernia repair with bilateral myofascial releases. 11/25/2021 office visit with Dr. Dema Severin status post colostomy takedown and recommended repeat screening colonoscopy.  He is not having any GERD with protonix 40 mg daily.  He denies dysphagia, melena.  No AB pain but can feel his mesh from hernia surgery.  He continues to drink 3-4 beers every day.  Used to smoke but denies smoking currently. Does have wheezing in the morning, better throughout the day, some shortness of breath with exertion which is unchanged for him, no chest pain. He is not on NSAIDS.  Not on any blood thinners. He is on [employer] vitamins with iron.  Friend drove him, patient is unable to drive, he is hard of hearing.    EGD 05/26/2020: - Benign-appearing esophageal stenosis. Dilated. - LA Grade B reflux esophagitis with no bleeding. - 5 cm hiatal hernia. - Gastritis. Biopsied. - Erythematous duodenopathy. Biopsied. -  Non-bleeding duodenal ulcer with no stigmata of bleeding. - Normal second portion of the duodenum. Impression: - Perform a colonoscopy today. - Use Protonix (pantoprazole) 40 mg PO BID for 10 weeks, then reduce to 40 mg daily. - Await pathology results. - Full liquid diet today, then advance tomorrow as tolerated. - Use sucralfate suspension 1 gram PO QID for 4 weeks. - May consider repeat EGD in 6-8 weeks as an outpatient to evaluate for appropriate mucosal healing.   Colonoscopy 05/26/2020: - Hemorrhoids found on perianal exam. - Malignant partially obstructing tumor in the proximal rectum. Biopsied. Tattooed. - Non-bleeding internal hemorrhoids. - The examined portion of the ileum was normal.   Biopsy Results:  . DUODENUM, BIOPSY:  - Gastric heterotopia.  - Warthin-Starry stain is negative for Helicobacter pylori.   B. STOMACH, BIOPSY:  - Mild chronic gastritis.  - Warthin-Starry stain is negative for Helicobacter pylori.   C. RECTAL MASS, BIOPSY:  - At least intramucosal adenocarcinoma, see comment.   He  reports that he quit smoking about 2 years ago. His smoking use included cigarettes. He has never used smokeless tobacco. He reports current alcohol use. He reports that he does not use drugs. His Family history is unknown by patient.   Current Medications:    Current Outpatient Medications (Cardiovascular):    amLODipine (NORVASC) 5 MG tablet, TAKE 1 TABLET (5 MG TOTAL) BY MOUTH DAILY.   Current Outpatient Medications (Analgesics):    acetaminophen (TYLENOL) 500 MG tablet, Take 1,000 mg by mouth every 6 (six) hours as needed for mild pain or moderate pain.   Current Outpatient Medications (Other):    pantoprazole (PROTONIX) 40 MG tablet, TAKE 1 TABLET BY MOUTH EVERY DAY   Pediatric Multiple Vitamins (CHILDRENS MULTIVITAMIN) chewable tablet, Chew 1 tablet by mouth daily. With iron   traZODone (DESYREL) 50 MG tablet, Take 1 tablet (50 mg total) by mouth at bedtime as  needed. for sleep  Surgical History:  He  has a past surgical history that includes Esophagogastroduodenoscopy (egd) with propofol (N/A, 05/26/2020); Colonoscopy with propofol (N/A, 05/26/2020); biopsy (05/26/2020); Balloon dilation (N/A, 05/26/2020); Submucosal tattoo injection (05/26/2020); XI robotic assisted lower anterior resection (N/A, 05/30/2020); Colostomy (N/A, 05/30/2020); Hernia repair; Xi robotic assisted colostomy takedown (N/A, 02/13/2021); Flexible sigmoidoscopy (N/A, 02/13/2021); Parastomal hernia repair (N/A, 02/13/2021); and XI robotic assisted ventral hernia (N/A, 07/15/2021).  Current Medications, Allergies, Past Medical History, Past Surgical History, Family History and Social History were reviewed in [employer] record.  Physical Exam: BP 136/82   Pulse (!) 107   Ht _0  (1.727 m)   Wt 141 lb (64 kg)   BMI 21.44 kg/m  General:   Pleasant, well developed male in no acute distress, poor dentition. Heart : Regular rate and rhythm; no murmurs Pulm: Clear anteriorly; no wheezing Abdomen:  Soft, obese, left lower quadrant with well-healed horizontal scar, some firmness to palpation from mesh, Active bowel sounds. No tenderness . Without guarding and Without rebound, No organomegaly appreciated. Rectal: Not evaluated Extremities:  without  edema. Neurologic:  Alert and  oriented x4;  No focal deficits.  Psych:  Cooperative. Normal mood and affect.   Vladimir Crofts, PA-C 02/11/22

## 2022-02-11 ENCOUNTER — Encounter: Payer: Self-pay | Admitting: Physician Assistant

## 2022-02-11 ENCOUNTER — Ambulatory Visit: Payer: Medicare Other | Admitting: Physician Assistant

## 2022-02-11 VITALS — BP 136/82 | HR 107 | Ht 68.0 in | Wt 141.0 lb

## 2022-02-11 DIAGNOSIS — C19 Malignant neoplasm of rectosigmoid junction: Secondary | ICD-10-CM

## 2022-02-11 DIAGNOSIS — K269 Duodenal ulcer, unspecified as acute or chronic, without hemorrhage or perforation: Secondary | ICD-10-CM

## 2022-02-11 DIAGNOSIS — F1010 Alcohol abuse, uncomplicated: Secondary | ICD-10-CM

## 2022-02-11 DIAGNOSIS — K760 Fatty (change of) liver, not elsewhere classified: Secondary | ICD-10-CM

## 2022-02-11 DIAGNOSIS — Z9889 Other specified postprocedural states: Secondary | ICD-10-CM | POA: Diagnosis not present

## 2022-02-11 NOTE — Patient Instructions (Signed)
_______________________________________________________  If you are age 67 or older, your body mass index should be between 23-30. Your Body mass index is 21.44 kg/m. If this is out of the aforementioned range listed, please consider follow up with your Primary Care Provider.  If you are age 20 or younger, your body mass index should be between 19-25. Your Body mass index is 21.44 kg/m. If this is out of the aformentioned range listed, please consider follow up with your Primary Care Provider.   ________________________________________________________  The [facility] providers would like to encourage you to use [facility] to communicate with providers for non-urgent requests or questions.  Due to long hold times on the telephone, sending your provider a message by [facility] may be a faster and more efficient way to get a response.  Please allow 48 business hours for a response.  Please remember that this is for non-urgent requests.  _______________________________________________________  Marc Mcdonald have been scheduled for an endoscopy and colonoscopy. Please follow the written instructions given to you at your visit today. Please pick up your prep supplies at the pharmacy within the next 1-3 days. If you use inhalers (even only as needed), please bring them with you on the day of your procedure.  Please call with any questions or concerns.  It was a pleasure to see you today!  Thank you for trusting me with your gastrointestinal care!

## 2022-02-18 NOTE — Progress Notes (Signed)
Agree with assessment/plan.  Raj Deionna Marcantonio, MD [facility] [phone]  

## 2022-03-18 ENCOUNTER — Encounter (HOSPITAL_BASED_OUTPATIENT_CLINIC_OR_DEPARTMENT_OTHER): Payer: Self-pay | Admitting: Family Medicine

## 2022-03-18 ENCOUNTER — Ambulatory Visit (INDEPENDENT_AMBULATORY_CARE_PROVIDER_SITE_OTHER): Payer: Medicare Other | Admitting: Family Medicine

## 2022-03-18 VITALS — BP 152/75 | HR 103 | Ht 68.0 in | Wt 142.0 lb

## 2022-03-18 DIAGNOSIS — I10 Essential (primary) hypertension: Secondary | ICD-10-CM

## 2022-03-18 DIAGNOSIS — G4700 Insomnia, unspecified: Secondary | ICD-10-CM | POA: Diagnosis not present

## 2022-03-18 NOTE — Assessment & Plan Note (Signed)
On trazodone 50 mg at bedtime, reports this is managing his insomnia.  No refills needed.

## 2022-03-18 NOTE — Progress Notes (Signed)
   Established Patient Office Visit  Subjective   Patient ID: Marc Mcdonald, male    DOB: 14-Aug-1955  Age: 67 y.o. MRN: [id number]  Chief Complaint  Patient presents with   Follow-up    Pt here for f/u on HTN     HPI  Hypertension Medication compliance: taking as prescribed.  Denies chest pain, shortness of breath, lower extremity edema, vision changes, headaches.  Pertinent lab work: labs on 11/30 Monitoring: does not check BP at home Tolerating medication well: tolerates well, denies ankle swelling Continue current medication regimen: amlodipine 5 mg daily Follow-up: 3 months No refills needed today.   Review of Systems  Respiratory:  Negative for shortness of breath.   Cardiovascular:  Negative for chest pain.  Gastrointestinal:  Negative for abdominal pain, nausea and vomiting.  Musculoskeletal:  Positive for falls (occasionally, admits to alcohol consumption).  Neurological:  Negative for dizziness and headaches.      Objective:     BP (!) 152/75   Pulse (!) 103   Ht '5\' 8"'$  (1.727 m)   Wt 142 lb (64.4 kg)   SpO2 100%   BMI 21.59 kg/m    Physical Exam Vitals and nursing note reviewed.  Constitutional:      General: He is not in acute distress.    Appearance: Normal appearance.  Cardiovascular:     Rate and Rhythm: Tachycardia present.     Heart sounds: Normal heart sounds.  Pulmonary:     Effort: Pulmonary effort is normal.     Breath sounds: Normal breath sounds.  Skin:    General: Skin is warm and dry.  Neurological:     General: No focal deficit present.     Mental Status: He is alert. Mental status is at baseline.  Psychiatric:        Mood and Affect: Mood normal.        Behavior: Behavior normal.        Thought Content: Thought content normal.        Judgment: Judgment normal.      No results found for any visits on 03/18/22.    The ASCVD Risk score (Arnett DK, et al., 2019) failed to calculate for the following reasons:   Cannot find  a previous HDL lab   Cannot find a previous total cholesterol lab    Assessment & Plan:   Problem List Items Addressed This Visit     Insomnia    On trazodone 50 mg at bedtime, reports this is managing his insomnia.  No refills needed.      Hypertension - Primary    Taking amlodipine 5 mg as prescribed, denies missing doses.  Denies chest pain, shortness of breath, lower extremity edema, vision changes and headaches.  His last labs were done on 11/30 at an ER visit, electrolytes and kidney function stable.  Patient endorses alcohol consumption daily, endorses falls related to this. Counseled to consider decreasing alcohol intake as this would improve blood pressure control as well.  Continue with amlodipine 5 mg daily recommend DASH diet with moderate exercise. Blood pressure elevated in clinic, improved upon recheck.  No refills needed.     Increase water intake to avoid dehydration. Consider decreasing alcohol intake. DASH diet and moderate exercise.   Return in about 3 months (around 06/17/2022) for htn.    Chalmers Guest, FNP

## 2022-03-18 NOTE — Assessment & Plan Note (Addendum)
Taking amlodipine 5 mg as prescribed, denies missing doses.  Denies chest pain, shortness of breath, lower extremity edema, vision changes and headaches.  His last labs were done on 11/30 at an ER visit, electrolytes and kidney function stable.  Patient endorses alcohol consumption daily, endorses falls related to this. Counseled to consider decreasing alcohol intake as this would improve blood pressure control as well.  Continue with amlodipine 5 mg daily recommend DASH diet with moderate exercise. Blood pressure elevated in clinic, improved upon recheck.  No refills needed.

## 2022-03-21 ENCOUNTER — Ambulatory Visit (HOSPITAL_BASED_OUTPATIENT_CLINIC_OR_DEPARTMENT_OTHER): Payer: Medicare Other | Admitting: Family Medicine

## 2022-04-14 ENCOUNTER — Encounter: Payer: Self-pay | Admitting: Gastroenterology

## 2022-04-21 ENCOUNTER — Encounter: Payer: Self-pay | Admitting: Gastroenterology

## 2022-04-21 ENCOUNTER — Ambulatory Visit (AMBULATORY_SURGERY_CENTER): Payer: Medicare Other | Admitting: Gastroenterology

## 2022-04-21 VITALS — BP 128/79 | HR 92 | Temp 97.1°F | Resp 23 | Ht 68.0 in | Wt 141.0 lb

## 2022-04-21 DIAGNOSIS — K2950 Unspecified chronic gastritis without bleeding: Secondary | ICD-10-CM

## 2022-04-21 DIAGNOSIS — K222 Esophageal obstruction: Secondary | ICD-10-CM

## 2022-04-21 DIAGNOSIS — Z08 Encounter for follow-up examination after completed treatment for malignant neoplasm: Secondary | ICD-10-CM

## 2022-04-21 DIAGNOSIS — K2970 Gastritis, unspecified, without bleeding: Secondary | ICD-10-CM

## 2022-04-21 DIAGNOSIS — C19 Malignant neoplasm of rectosigmoid junction: Secondary | ICD-10-CM | POA: Diagnosis not present

## 2022-04-21 DIAGNOSIS — K760 Fatty (change of) liver, not elsewhere classified: Secondary | ICD-10-CM

## 2022-04-21 DIAGNOSIS — K209 Esophagitis, unspecified without bleeding: Secondary | ICD-10-CM

## 2022-04-21 DIAGNOSIS — Z85048 Personal history of other malignant neoplasm of rectum, rectosigmoid junction, and anus: Secondary | ICD-10-CM | POA: Diagnosis not present

## 2022-04-21 MED ORDER — SODIUM CHLORIDE 0.9 % IV SOLN: 500.0000 mL | Freq: Once | INTRAVENOUS | Status: DC

## 2022-04-21 NOTE — Progress Notes (Signed)
Pt's states no medical or surgical changes since previsit or office visit. 

## 2022-04-21 NOTE — Op Note (Signed)
[name] Procedure Date: 04/21/2022 9:31 AM MRN: [id number] Endoscopist: Jackquline Denmark , MD, [id number] Age: 67 Referring MD:  Date of Birth: 1955/10/08 Gender: Male Account #: [id number] Procedure:                Upper GI endoscopy Indications:              GERD. No further dysphagia after previous dilation Medicines:                Monitored Anesthesia Care Procedure:                Pre-Anesthesia Assessment:                           - Prior to the procedure, a History and Physical                            was performed, and patient medications and                            allergies were reviewed. The patient's tolerance of                            previous anesthesia was also reviewed. The risks                            and benefits of the procedure and the sedation                            options and risks were discussed with the patient.                            All questions were answered, and informed consent                            was obtained. Prior Anticoagulants: The patient has                            taken no anticoagulant or antiplatelet agents. ASA                            Grade Assessment: III - A patient with severe                            systemic disease. After reviewing the risks and                            benefits, the patient was deemed in satisfactory                            condition to undergo the procedure.                           After obtaining informed consent, the endoscope was  passed under direct vision. Throughout the                            procedure, the patient's blood pressure, pulse, and                            oxygen saturations were monitored continuously. The                            Olympus Scope [id number] was introduced through the                            mouth, and advanced to the second part of duodenum.                             The upper GI endoscopy was accomplished without                            difficulty. The patient tolerated the procedure                            well. Scope In: Scope Out: Findings:                 One benign-appearing, wide open mild stenosis was                            found 40 cm from the incisors with mildly irregular                            Z-line. No erosions. The stenosis was traversed.                            Biopsies were taken with a cold forceps for                            histology from all 4 quadrants to rule out short                            segment Barrett's esophagus. No varices.                           A 5 cm hiatal hernia was present.                           Diffuse mild inflammation characterized by erythema                            and nodularity was found in the entire examined                            stomach. Biopsies were taken with a cold forceps  for histology.                           The examined duodenum was normal. Complications:            No immediate complications. Estimated Blood Loss:     Estimated blood loss: none. Impression:               - Wide open benign-appearing esophageal stenosis.                            Biopsied.                           - 5 cm hiatal hernia.                           - Gastritis. Biopsied.                           - Normal examined duodenum. Recommendation:           - Patient has a contact number available for                            emergencies. The signs and symptoms of potential                            delayed complications were discussed with the                            patient. Return to normal activities tomorrow.                            Written discharge instructions were provided to the                            patient.                           - Resume previous diet.                           - Continue present medications including  Protonix                            40 mg p.o. daily.                           - No ibuprofen, naproxen, or other non-steroidal                            anti-inflammatory drugs.                           - Await pathology results.                           - Stop alcohol.                           -  The findings and recommendations were discussed                            with the patient's family. Jackquline Denmark, MD 04/21/2022 9:58:14 AM This report has been signed electronically.

## 2022-04-21 NOTE — Progress Notes (Signed)
Called to room to assist during endoscopic procedure.  Patient ID and intended procedure confirmed with present staff. Received instructions for my participation in the procedure from the performing physician.  

## 2022-04-21 NOTE — Patient Instructions (Addendum)
Await pathology results Avoid alcohol. Resume previous diet, resume previous medications, take pantoprazole 30 minutes before a meal daily. Next colonoscopy in 3 years, sooner if you have any symptoms or concerns. No NSAIDS (Non-Steroidal anti-inflammatory drugs), (These include, aspirin, aspirin-containing products, ibuprofen, advil, motrin, naproxen, aleve, goody powders, etc) ask your primary care doctor if Tylenol is ok to take every 6 hours as needed, do not exceed 6 pills per day, see label for instructions. Information given for esophagus stricture,GERD, hiatal hernia, gastritis, avoiding alcohol, hemorrhoids  YOU HAD AN ENDOSCOPIC PROCEDURE TODAY AT [facility]:   Refer to the procedure report that was given to you for any specific questions about what was found during the examination.  If the procedure report does not answer your questions, please call your gastroenterologist to clarify.  If you requested that your care partner not be given the details of your procedure findings, then the procedure report has been included in a sealed envelope for you to review at your convenience later.  YOU SHOULD EXPECT: Some feelings of bloating in the abdomen. Passage of more gas than usual.  Walking can help get rid of the air that was put into your GI tract during the procedure and reduce the bloating. If you had a lower endoscopy (such as a colonoscopy or flexible sigmoidoscopy) you may notice spotting of blood in your stool or on the toilet paper. If you underwent a bowel prep for your procedure, you may not have a normal bowel movement for a few days.  Please Note:  You might notice some irritation and congestion in your nose or some drainage.  This is from the oxygen used during your procedure.  There is no need for concern and it should clear up in a day or so.  SYMPTOMS TO REPORT IMMEDIATELY:  Following lower endoscopy (colonoscopy):  Excessive amounts of blood in the  stool  Significant tenderness or worsening of abdominal pains  Swelling of the abdomen that is new, acute  Fever of 100F or higher Following upper endoscopy (EGD)  Vomiting of blood or coffee ground material  New chest pain or pain under the shoulder blades  Painful or persistently difficult swallowing  New shortness of breath  Black, tarry-looking stools  For urgent or emergent issues, a gastroenterologist can be reached at any hour by calling [phone]. Do not use MyChart messaging for urgent concerns.    DIET:  We do recommend a small meal at first, but then you may proceed to your regular diet.  Drink plenty of fluids but you should avoid alcoholic beverages for 24 hours.  ACTIVITY:  You should plan to take it easy for the rest of today and you should NOT DRIVE or use heavy machinery until tomorrow (because of the sedation medicines used during the test).    FOLLOW UP: Our staff will call the number listed on your records the next business day following your procedure.  We will call around 7:15- 8:00 am to check on you and address any questions or concerns that you may have regarding the information given to you following your procedure. If we do not reach you, we will leave a message.     If any biopsies were taken you will be contacted by phone or by letter within the next 1-3 weeks.  Please call us at [phone] if you have not heard about the biopsies in 3 weeks.    SIGNATURES/CONFIDENTIALITY: You and/or your care partner have signed paperwork  which will be entered into your electronic medical record.  These signatures attest to the fact that that the information above on your After Visit Summary has been reviewed and is understood.  Full responsibility of the confidentiality of this discharge information lies with you and/or your care-partner.

## 2022-04-21 NOTE — Progress Notes (Signed)
02/11/2022 Marc Mcdonald BP:[id number] 1955/09/05   Referring provider: de [place], Raymond J, MD Primary GI doctor: Dr. Lyndel Safe   ASSESSMENT AND PLAN:    Colorectal adenocarcinoma stage IIa status post robotic assisted lower anterior resection with Dr. Dema Severin 05/2020. S/P colostomy takedown 01/2021 05/2021 CT chest abdomen pelvis without evidence of mets. Will repeat colonoscopy for surveillance purposes. Patient appears appropriate for [facility], scheduled with Dr. Lyndel Safe. We have discussed the risks of bleeding, infection, perforation, medication reactions, and remote risk of death associated with colonoscopy. All questions were answered and the patient acknowledges these risk and wishes to proceed.   Duodenal ulcer on EGD 05/2020 with 5 cm hiatal hernia and grade B esophagitis. No NSAIDs, patient continues to drink 3-4 beers daily. Some dysphagia with large pills only. Discussed with the patient and with previous duodenal ulcer history, continuing alcohol use will plan for EGD at the time of colonoscopy to evaluate for varices, esophagitis, gastritis, peptic ulcer disease. Counseled on alcohol cessation, no NSAIDs. Continue Protonix once daily for now. I discussed risks of EGD with patient today, including risk of sedation, bleeding or perforation. Patient provides understanding and gave verbal consent to proceed.   Alcohol abuse with history of fatty liver 09/20/2021 AST 45, ALT 21, alk phos 77 01/23/2022 MCV 103, Hgb 15.2 06/27/2021 MRI liver with and without contrast due to abnormal CT chest abdomen and pelvis with contrast showed a focal fatty infiltration of liver no suspicious hepatic lesions, no cirrhosis, no splenomegaly. Should get liver function every 6 months, consider ultrasound elastography next visit.   Left supraclavicular fullness, no appreciated lymph node.   05/2021 CT chest abdomen pelvis with contrast showed emphysema, scarring, but no metastasis or nodules. 01/23/2022  chest x-ray with fall no consolidations, no pleural effusion, no abnormality. Follow-up PCP.   History of Present Illness:  67 y.o. male  with a past medical history of COPD, alcohol use disorder, reflux esophagitis, duodenal ulcers, IDA, Colo rectal adenocarcinoma stage IIa status post robotic assisted lower anterior resection with Dr. Dema Severin 05/2020, postoperative ileus, no evidence of metastasis CT  05/2021 colostomy takedown with stapled colorectal anastomosis 02/13/21.Marland Kitchenand others listed below, returns to clinic today for evaluation of colonoscopy.   08/22/2020 last seen in the office by [facility] NP, at that time patient still had colostomy from previous colorectal surgery for adenocarcinoma.  He also had duodenal ulcers possibly from alcohol, declined repeat endoscopy which is recommended to evaluate for healing. Patient lost to follow-up until today at our office but has been following with Dr. Dema Severin.  Declined follow-up with Dr. Burr Medico.. 02/13/2021 colostomy takedown with Dr. Dema Severin. 07/15/2021 robotic incisional hernia repair with bilateral myofascial releases. 11/25/2021 office visit with Dr. Dema Severin status post colostomy takedown and recommended repeat screening colonoscopy.   He is not having any GERD with protonix 40 mg daily.  He denies dysphagia, melena.  No AB pain but can feel his mesh from hernia surgery.  He continues to drink 3-4 beers every day.  Used to smoke but denies smoking currently. Does have wheezing in the morning, better throughout the day, some shortness of breath with exertion which is unchanged for him, no chest pain. He is not on NSAIDS.  Not on any blood thinners. He is on [employer] vitamins with iron.  Friend drove him, patient is unable to drive, he is hard of hearing.      EGD 05/26/2020: - Benign-appearing esophageal stenosis. Dilated. - LA Grade B reflux esophagitis with no  bleeding. - 5 cm hiatal hernia. - Gastritis. Biopsied. - Erythematous duodenopathy.  Biopsied. - Non-bleeding duodenal ulcer with no stigmata of bleeding. - Normal second portion of the duodenum. Impression: - Perform a colonoscopy today. - Use Protonix (pantoprazole) 40 mg PO BID for 10 weeks, then reduce to 40 mg daily. - Await pathology results. - Full liquid diet today, then advance tomorrow as tolerated. - Use sucralfate suspension 1 gram PO QID for 4 weeks. - May consider repeat EGD in 6-8 weeks as an outpatient to evaluate for appropriate mucosal healing.   Colonoscopy 05/26/2020: - Hemorrhoids found on perianal exam. - Malignant partially obstructing tumor in the proximal rectum. Biopsied. Tattooed. - Non-bleeding internal hemorrhoids. - The examined portion of the ileum was normal.   Biopsy Results:  . DUODENUM, BIOPSY:  - Gastric heterotopia.  - Warthin-Starry stain is negative for Helicobacter pylori.   B. STOMACH, BIOPSY:  - Mild chronic gastritis.  - Warthin-Starry stain is negative for Helicobacter pylori.   C. RECTAL MASS, BIOPSY:  - At least intramucosal adenocarcinoma, see comment.    He  reports that he quit smoking about 2 years ago. His smoking use included cigarettes. He has never used smokeless tobacco. He reports current alcohol use. He reports that he does not use drugs. His Family history is unknown by patient.     Current Medications:      Current Outpatient Medications (Cardiovascular):    amLODipine (NORVASC) 5 MG tablet, TAKE 1 TABLET (5 MG TOTAL) BY MOUTH DAILY.     Current Outpatient Medications (Analgesics):    acetaminophen (TYLENOL) 500 MG tablet, Take 1,000 mg by mouth every 6 (six) hours as needed for mild pain or moderate pain.     Current Outpatient Medications (Other):    pantoprazole (PROTONIX) 40 MG tablet, TAKE 1 TABLET BY MOUTH EVERY DAY   Pediatric Multiple Vitamins (CHILDRENS MULTIVITAMIN) chewable tablet, Chew 1 tablet by mouth daily. With iron   traZODone (DESYREL) 50 MG tablet, Take 1 tablet (50 mg total)  by mouth at bedtime as needed. for sleep   Surgical History:  He  has a past surgical history that includes Esophagogastroduodenoscopy (egd) with propofol (N/A, 05/26/2020); Colonoscopy with propofol (N/A, 05/26/2020); biopsy (05/26/2020); Balloon dilation (N/A, 05/26/2020); Submucosal tattoo injection (05/26/2020); XI robotic assisted lower anterior resection (N/A, 05/30/2020); Colostomy (N/A, 05/30/2020); Hernia repair; Xi robotic assisted colostomy takedown (N/A, 02/13/2021); Flexible sigmoidoscopy (N/A, 02/13/2021); Parastomal hernia repair (N/A, 02/13/2021); and XI robotic assisted ventral hernia (N/A, 07/15/2021).   Current Medications, Allergies, Past Medical History, Past Surgical History, Family History and Social History were reviewed in [employer] record.   Physical Exam: BP 136/82   Pulse (!) 107   Ht '5\' 8"'$  (1.727 m)   Wt 141 lb (64 kg)   BMI 21.44 kg/m  General:   Pleasant, well developed male in no acute distress, poor dentition. Heart : Regular rate and rhythm; no murmurs Pulm: Clear anteriorly; no wheezing Abdomen:  Soft, obese, left lower quadrant with well-healed horizontal scar, some firmness to palpation from mesh, Active bowel sounds. No tenderness . Without guarding and Without rebound, No organomegaly appreciated. Rectal: Not evaluated Extremities:  without  edema. Neurologic:  Alert and  oriented x4;  No focal deficits.  Psych:  Cooperative. Normal mood and affect.     Vladimir Crofts, PA-C    Attending physician's note   I have taken history, reviewed the chart and examined the patient. I performed a substantive portion of this  encounter, including complete performance of at least one of the key components, in conjunction with the APP. I agree with the Advanced Practitioner's note, impression and recommendations.   For EGD/colon today   Carmell Austria, MD [facility] [phone]

## 2022-04-21 NOTE — Progress Notes (Signed)
Pt resting comfortably. VSS. Airway intact. SBAR complete to RN. All questions answered.   

## 2022-04-21 NOTE — Op Note (Signed)
[place] Patient Name: Marc Mcdonald Procedure Date: 04/21/2022 9:29 AM MRN: [id number] Endoscopist: Jackquline Denmark , MD, [id number] Age: 67 Referring MD:  Date of Birth: 07-04-1955 Gender: Male Account #: [id number] Procedure:                Colonoscopy Indications:              High risk colon cancer surveillance: Personal                            history of colon cancer (stage IIa) s/p robotic                            assisted LAR with Dr. Dema Severin 05/2020. Medicines:                Monitored Anesthesia Care Procedure:                Pre-Anesthesia Assessment:                           - Prior to the procedure, a History and Physical                            was performed, and patient medications and                            allergies were reviewed. The patient's tolerance of                            previous anesthesia was also reviewed. The risks                            and benefits of the procedure and the sedation                            options and risks were discussed with the patient.                            All questions were answered, and informed consent                            was obtained. Prior Anticoagulants: The patient has                            taken no anticoagulant or antiplatelet agents. ASA                            Grade Assessment: III - A patient with severe                            systemic disease. After reviewing the risks and                            benefits, the patient was deemed in satisfactory  condition to undergo the procedure.                           After obtaining informed consent, the colonoscope                            was passed under direct vision. Throughout the                            procedure, the patient's blood pressure, pulse, and                            oxygen saturations were monitored continuously. The                            Olympus [id number]  Colonoscope was                            introduced through the anus and advanced to the 2                            cm into the ileum. The colonoscopy was performed                            without difficulty. The patient tolerated the                            procedure well. The quality of the bowel                            preparation was good. The terminal ileum, ileocecal                            valve, appendiceal orifice, and rectum were                            photographed. Scope In: 9:47:31 AM Scope Out: 9:55:32 AM Scope Withdrawal Time: 0 hours 5 minutes 46 seconds  Total Procedure Duration: 0 hours 8 minutes 1 second  Findings:                 There was evidence of a prior end-to-side                            colo-colonic anastomosis in the distal sigmoid, Px                            rectum, 10 cm from anal verge. This was patent and                            was characterized by healthy appearing mucosa.                           Non-bleeding internal hemorrhoids were found during  retroflexion. The hemorrhoids were small and Grade                            I (internal hemorrhoids that do not prolapse).                           The terminal ileum appeared normal.                           The exam was otherwise without abnormality on                            direct and retroflexion views. Complications:            No immediate complications. Estimated Blood Loss:     Estimated blood loss: none. Impression:               - Patent end-to-side colo-colonic anastomosis,                            characterized by healthy appearing mucosa. No                            recurrence.                           - Non-bleeding internal hemorrhoids.                           - The examined portion of the ileum was normal.                           - The examination was otherwise normal on direct                            and retroflexion  views.                           - No specimens collected. Recommendation:           - Patient has a contact number available for                            emergencies. The signs and symptoms of potential                            delayed complications were discussed with the                            patient. Return to normal activities tomorrow.                            Written discharge instructions were provided to the                            patient.                           -  Resume previous diet.                           - Continue present medications.                           - Repeat colonoscopy in 3 years for surveillance.                            Earlier, if with any new problems                           - The findings and recommendations were discussed                            with the patient's family. Jackquline Denmark, MD 04/21/2022 10:03:34 AM This report has been signed electronically.

## 2022-04-22 ENCOUNTER — Telehealth: Payer: Self-pay | Admitting: *Deleted

## 2022-04-22 NOTE — Telephone Encounter (Signed)
  Follow up Call-    Row Labels 04/21/2022    8:09 AM  Call back number   Section Header. No data exists in this row.   Post procedure Call Back phone  #   [phone]  Permission to leave phone message   Yes     Patient questions:  Do you have a fever, pain , or abdominal swelling? No. Pain Score  0 *  Have you tolerated food without any problems? Yes.    Have you been able to return to your normal activities? Yes.    Do you have any questions about your discharge instructions: Diet   No. Medications  No. Follow up visit  No.  Do you have questions or concerns about your Care? No.  Actions: * If pain score is 4 or above: No action needed, pain <4.

## 2022-04-27 ENCOUNTER — Encounter: Payer: Self-pay | Admitting: Gastroenterology

## 2022-07-04 ENCOUNTER — Ambulatory Visit (HOSPITAL_BASED_OUTPATIENT_CLINIC_OR_DEPARTMENT_OTHER): Payer: Medicare Other | Admitting: Family Medicine

## 2022-07-08 ENCOUNTER — Other Ambulatory Visit (HOSPITAL_BASED_OUTPATIENT_CLINIC_OR_DEPARTMENT_OTHER): Payer: Self-pay | Admitting: Family Medicine

## 2022-07-08 DIAGNOSIS — G4700 Insomnia, unspecified: Secondary | ICD-10-CM

## 2022-07-11 ENCOUNTER — Ambulatory Visit (INDEPENDENT_AMBULATORY_CARE_PROVIDER_SITE_OTHER): Payer: Medicare Other | Admitting: Family Medicine

## 2022-07-11 ENCOUNTER — Encounter (HOSPITAL_BASED_OUTPATIENT_CLINIC_OR_DEPARTMENT_OTHER): Payer: Self-pay | Admitting: Family Medicine

## 2022-07-11 VITALS — BP 129/82 | HR 101 | Temp 97.9°F | Ht 68.0 in | Wt 135.0 lb

## 2022-07-11 DIAGNOSIS — I10 Essential (primary) hypertension: Secondary | ICD-10-CM | POA: Diagnosis not present

## 2022-07-11 MED ORDER — AMLODIPINE BESYLATE 5 MG PO TABS: 5.0000 mg | ORAL_TABLET | Freq: Every day | ORAL | 1 refills | Status: DC

## 2022-07-11 NOTE — Progress Notes (Signed)
    Procedures performed today:    None.  Independent interpretation of notes and tests performed by another provider:   None.  Brief History, Exam, Impression, and Recommendations:    BP 129/82   Pulse (!) 101   Temp 97.9 F (36.6 C) (Oral)   Ht 5\' 8"  (1.727 m)   Wt 135 lb (61.2 kg)   SpO2 95%   BMI 20.53 kg/m   Hypertension Blood pressure at goal in office today.  Patient does not check blood pressure regularly at home.  Continues with medications as prescribed.  Pulse is borderline tachycardic, has been this way at prior office visits as well.  Denies any current issues with chest pain, headaches, lightheadedness or dizziness. At this time we will continue with current medication regimen.  Recommend intermittent monitoring of blood pressure at home, DASH diet.  Encouraged to maintain adequate hydration, recommend alcohol cessation. Plan for follow-up in about 3 to 4 months for continued monitoring or sooner as needed  Return in about 4 months (around 11/11/2022) for HTN.   ___________________________________________ Arica B[place] de [place], MD, ABFM, [facility]

## 2022-07-11 NOTE — Assessment & Plan Note (Signed)
Blood pressure at goal in office today.  Patient does not check blood pressure regularly at home.  Continues with medications as prescribed.  Pulse is borderline tachycardic, has been this way at prior office visits as well.  Denies any current issues with chest pain, headaches, lightheadedness or dizziness. At this time we will continue with current medication regimen.  Recommend intermittent monitoring of blood pressure at home, DASH diet.  Encouraged to maintain adequate hydration, recommend alcohol cessation. Plan for follow-up in about 3 to 4 months for continued monitoring or sooner as needed

## 2022-08-05 ENCOUNTER — Ambulatory Visit (INDEPENDENT_AMBULATORY_CARE_PROVIDER_SITE_OTHER): Payer: Medicare Other

## 2022-08-05 ENCOUNTER — Encounter (HOSPITAL_BASED_OUTPATIENT_CLINIC_OR_DEPARTMENT_OTHER): Payer: Self-pay

## 2022-08-05 VITALS — Ht 68.0 in | Wt 135.0 lb

## 2022-08-05 DIAGNOSIS — Z0100 Encounter for examination of eyes and vision without abnormal findings: Secondary | ICD-10-CM

## 2022-08-05 DIAGNOSIS — H9193 Unspecified hearing loss, bilateral: Secondary | ICD-10-CM

## 2022-08-05 DIAGNOSIS — H9313 Tinnitus, bilateral: Secondary | ICD-10-CM

## 2022-08-05 DIAGNOSIS — Z0000 Encounter for general adult medical examination without abnormal findings: Secondary | ICD-10-CM

## 2022-08-05 NOTE — Patient Instructions (Signed)
[name] , Thank you for taking time to come for your Medicare Wellness Visit. I appreciate your ongoing commitment to your health goals. Please review the following plan we discussed and let me know if I can assist you in the future.   These are the goals we discussed:  Goals      Family-spend time with family     Patient Stated     Patient states his goal is to "stay alive" for the year        This is a list of the screening recommended for you and due dates:  Health Maintenance  Topic Date Due   COVID-19 Vaccine (1) Never done   Hepatitis C Screening  Never done   DTaP/Tdap/Td vaccine (1 - Tdap) Never done   Zoster (Shingles) Vaccine (1 of 2) Never done   Pneumonia Vaccine (1 of 1 - PCV) Never done   Flu Shot  09/25/2022   Medicare Annual Wellness Visit  08/05/2023   Colon Cancer Screening  04/21/2025   HPV Vaccine  Aged Out    Advanced directives: Advance directive discussed with you today. Even though you declined this today, please call our office should you change your mind, and we can give you the proper paperwork for you to fill out. Advance care planning is a way to make decisions about medical care that fits your values in case you are ever unable to make these decisions for yourself.  Information on Advanced Care Planning can be found at [facility] Advance Health Care Directives Advance Health Care Directives ([email or web address]    Conditions/risks identified: You have been referred to an ENT and eye doctor: If you haven't heard from them by the end of the week, please call their office to schedule your appointment  You have been referred to [facility] for a complete eye exam. If you haven't heard from them in a few days, please call them to schedule your appointment.  [facility] [address] [place] [address] Phone: [phone]  E[facility] Nose and Throat Suzanna Obey [address] [place] [address] Phone:  [phone]   Next appointment: Follow up in one year for your annual wellness visit.  August 11, 2023 at 10:30am TELEPHONE VISIT  Preventive Care 18 Years and Older, Male  Preventive care refers to lifestyle choices and visits with your health care provider that can promote health and wellness. What does preventive care include? A yearly physical exam. This is also called an annual well check. Dental exams once or twice a year. Routine eye exams. Ask your health care provider how often you should have your eyes checked. Personal lifestyle choices, including: Daily care of your teeth and gums. Regular physical activity. Eating a healthy diet. Avoiding tobacco and drug use. Limiting alcohol use. Practicing safe sex. Taking low doses of aspirin every day. Taking vitamin and mineral supplements as recommended by your health care provider. What happens during an annual well check? The services and screenings done by your health care provider during your annual well check will depend on your age, overall health, lifestyle risk factors, and family history of disease. Counseling  Your health care provider may ask you questions about your: Alcohol use. Tobacco use. Drug use. Emotional well-being. Home and relationship well-being. Sexual activity. Eating habits. History of falls. Memory and ability to understand (cognition). Work and work Astronomer. Screening  You may have the following tests or measurements:  Height, weight, and BMI. Blood pressure. Lipid and cholesterol levels. These may be checked every 5 years, or more frequently if you are over 8 years old. Skin check. Lung cancer screening. You may have this screening every year starting at age 47 if you have a 30-pack-year history of smoking and currently smoke or have quit within the past 15 years. Fecal occult blood test (FOBT) of the stool. You may have this test every year starting at age 63. Flexible sigmoidoscopy or  colonoscopy. You may have a sigmoidoscopy every 5 years or a colonoscopy every 10 years starting at age 77. Prostate cancer screening. Recommendations will vary depending on your family history and other risks. Hepatitis C blood test. Hepatitis B blood test. Sexually transmitted disease (STD) testing. Diabetes screening. This is done by checking your blood sugar (glucose) after you have not eaten for a while (fasting). You may have this done every 1-3 years. Abdominal aortic aneurysm (AAA) screening. You may need this if you are a current or former smoker. Osteoporosis. You may be screened starting at age 27 if you are at high risk. Talk with your health care provider about your test results, treatment options, and if necessary, the need for more tests. Vaccines  Your health care provider may recommend certain vaccines, such as: Influenza vaccine. This is recommended every year. Tetanus, diphtheria, and acellular pertussis (Tdap, Td) vaccine. You may need a Td booster every 10 years. Zoster vaccine. You may need this after age 69. Pneumococcal 13-valent conjugate (PCV13) vaccine. One dose is recommended after age 54. Pneumococcal polysaccharide (PPSV23) vaccine. One dose is recommended after age 2. Talk to your health care provider about which screenings and vaccines you need and how often you need them. This information is not intended to replace advice given to you by your health care provider. Make sure you discuss any questions you have with your health care provider. Document Released: 03/09/2015 Document Revised: 10/31/2015 Document Reviewed: 12/12/2014 Elsevier Interactive Patient Education  2017 [employer].  Fall Prevention in the Home Falls can cause injuries. They can happen to people of all ages. There are many things you can do to make your home safe and to help prevent falls. What can I do on the outside of my home? Regularly fix the edges of walkways and driveways and fix  any cracks. Remove anything that might make you trip as you walk through a door, such as a raised step or threshold. Trim any bushes or trees on the path to your home. Use bright outdoor lighting. Clear any walking paths of anything that might make someone trip, such as rocks or tools. Regularly check to see if handrails are loose or broken. Make sure that both sides of any steps have handrails. Any raised decks and porches should have guardrails on the edges. Have any leaves, snow, or ice cleared regularly. Use sand or salt on walking paths during winter. Clean up any spills in your garage right away. This includes oil or grease spills. What can I do in the bathroom? Use night lights. Install grab bars by the toilet and in the tub and shower. Do not use towel bars as grab bars. Use non-skid mats or decals in the tub or shower. If you need to sit down in the shower, use a plastic, non-slip stool. Keep the floor dry. Clean up any water that spills on the floor as soon as it happens. Remove soap buildup in the tub or shower regularly. Attach bath mats  securely with double-sided non-slip rug tape. Do not have throw rugs and other things on the floor that can make you trip. What can I do in the bedroom? Use night lights. Make sure that you have a light by your bed that is easy to reach. Do not use any sheets or blankets that are too big for your bed. They should not hang down onto the floor. Have a firm chair that has side arms. You can use this for support while you get dressed. Do not have throw rugs and other things on the floor that can make you trip. What can I do in the kitchen? Clean up any spills right away. Avoid walking on wet floors. Keep items that you use a lot in easy-to-reach places. If you need to reach something above you, use a strong step stool that has a grab bar. Keep electrical cords out of the way. Do not use floor polish or wax that makes floors slippery. If you  must use wax, use non-skid floor wax. Do not have throw rugs and other things on the floor that can make you trip. What can I do with my stairs? Do not leave any items on the stairs. Make sure that there are handrails on both sides of the stairs and use them. Fix handrails that are broken or loose. Make sure that handrails are as long as the stairways. Check any carpeting to make sure that it is firmly attached to the stairs. Fix any carpet that is loose or worn. Avoid having throw rugs at the top or bottom of the stairs. If you do have throw rugs, attach them to the floor with carpet tape. Make sure that you have a light switch at the top of the stairs and the bottom of the stairs. If you do not have them, ask someone to add them for you. What else can I do to help prevent falls? Wear shoes that: Do not have high heels. Have rubber bottoms. Are comfortable and fit you well. Are closed at the toe. Do not wear sandals. If you use a stepladder: Make sure that it is fully opened. Do not climb a closed stepladder. Make sure that both sides of the stepladder are locked into place. Ask someone to hold it for you, if possible. Clearly mark and make sure that you can see: Any grab bars or handrails. First and last steps. Where the edge of each step is. Use tools that help you move around (mobility aids) if they are needed. These include: Canes. Walkers. Scooters. Crutches. Turn on the lights when you go into a dark area. Replace any light bulbs as soon as they burn out. Set up your furniture so you have a clear path. Avoid moving your furniture around. If any of your floors are uneven, fix them. If there are any pets around you, be aware of where they are. Review your medicines with your doctor. Some medicines can make you feel dizzy. This can increase your chance of falling. Ask your doctor what other things that you can do to help prevent falls. This information is not intended to replace  advice given to you by your health care provider. Make sure you discuss any questions you have with your health care provider. Document Released: 12/07/2008 Document Revised: 07/19/2015 Document Reviewed: 03/17/2014 Elsevier Interactive Patient Education  2017 [employer].

## 2022-08-05 NOTE — Progress Notes (Signed)
 I connected with  Ebony Cargo on 08/05/22 by a audio enabled telemedicine application and verified that I am speaking with the correct person using two identifiers.  Patient Location: Home  Provider Location: Home Office  I discussed the limitations of evaluation and management by telemedicine. The patient expressed understanding and agreed to proceed.  Subjective:   Marc Mcdonald is a 67 y.o. male who presents for Medicare Annual/Subsequent preventive examination.  Review of Systems     Cardiac Risk Factors include: advanced age (>51men, >55 women);hypertension;male gender;sedentary lifestyle     Objective:    Today's Vitals   08/05/22 1118 08/05/22 1119  Weight: 135 lb (61.2 kg)   Height: 5\' 8"  (1.727 m)   PainSc:  0-No pain   Body mass index is 20.53 kg/m.     08/05/2022   11:33 AM 07/11/2022    8:13 AM 09/20/2021    8:06 PM 08/16/2021   11:49 AM 07/15/2021    1:05 PM 07/12/2021    8:16 AM 02/13/2021    5:00 PM  Advanced Directives  Does Patient Have a Medical Advance Directive? No No No No No No No  Would patient like information on creating a medical advance directive? No - Patient declined   No - Patient declined No - Patient declined No - Patient declined No - Patient declined    Current Medications (verified) Outpatient Encounter Medications as of 08/05/2022  Medication Sig   acetaminophen (TYLENOL) 500 MG tablet Take 1,000 mg by mouth every 6 (six) hours as needed for mild pain or moderate pain.   amLODipine (NORVASC) 5 MG tablet Take 1 tablet (5 mg total) by mouth daily.   pantoprazole (PROTONIX) 40 MG tablet TAKE 1 TABLET BY MOUTH EVERY DAY   Pediatric Multiple Vitamins (CHILDRENS MULTIVITAMIN) chewable tablet Chew 1 tablet by mouth daily. With iron   traZODone (DESYREL) 50 MG tablet TAKE 1 TABLET BY MOUTH EVERY DAY AT BEDTIME AS NEEDED FOR SLEEP   No facility-administered encounter medications on file as of 08/05/2022.    Allergies  (verified) Patient has no known allergies.   History: Past Medical History:  Diagnosis Date   Arthritis    Colon cancer (HCC)    COPD (chronic obstructive pulmonary disease) (HCC)    Duodenal ulcer    Hypertension    Past Surgical History:  Procedure Laterality Date   BALLOON DILATION N/A 05/26/2020   Procedure: BALLOON DILATION;  Surgeon: Shellia Cleverly, DO;  Location: [facility];  Service: Endoscopy;  Laterality: N/A;   BIOPSY  05/26/2020   Procedure: BIOPSY;  Surgeon: Shellia Cleverly, DO;  Location: [facility];  Service: Endoscopy;;   COLONOSCOPY WITH PROPOFOL N/A 05/26/2020   Procedure: COLONOSCOPY WITH PROPOFOL;  Surgeon: Shellia Cleverly, DO;  Location: [facility];  Service: Endoscopy;  Laterality: N/A;   COLOSTOMY N/A 05/30/2020   Procedure: COLOSTOMY;  Surgeon: Andria Meuse, MD;  Location: [facility] OR;  Service: General;  Laterality: N/A;   ESOPHAGOGASTRODUODENOSCOPY (EGD) WITH PROPOFOL N/A 05/26/2020   Procedure: ESOPHAGOGASTRODUODENOSCOPY (EGD) WITH PROPOFOL;  Surgeon: Shellia Cleverly, DO;  Location: [facility];  Service: Endoscopy;  Laterality: N/A;   FLEXIBLE SIGMOIDOSCOPY N/A 02/13/2021   Procedure: FLEXIBLE SIGMOIDOSCOPY;  Surgeon: Andria Meuse, MD;  Location: [facility];  Service: General;  Laterality: N/A;   HERNIA REPAIR     PARASTOMAL HERNIA REPAIR N/A 02/13/2021   Procedure: PRIMARY REPAIR CLOSURE OF PARASTOMAL HERNIA, INTRAOPERATIVE ASSESSMENT OF PERFUSION;  Surgeon: Andria Meuse, MD;  Location: [facility];  Service: General;  Laterality: N/A;   SUBMUCOSAL TATTOO INJECTION  05/26/2020   Procedure: SUBMUCOSAL TATTOO INJECTION;  Surgeon: Shellia Cleverly, DO;  Location: [facility];  Service: Endoscopy;;   XI ROBOTIC ASSISTED COLOSTOMY TAKEDOWN N/A 02/13/2021   Procedure: XI ROBOTIC ASSISTED COLOSTOMY TAKEDOWN, TAP BLOCK;  Surgeon: Andria Meuse, MD;  Location: [facility];  Service: General;  Laterality: N/A;   XI ROBOTIC ASSISTED  LOWER ANTERIOR RESECTION N/A 05/30/2020   Procedure: XI ROBOTIC ASSISTED LOWER ANTERIOR RESECTION WITH END COLOSTOMY CREATION;  Surgeon: Andria Meuse, MD;  Location: [facility];  Service: General;  Laterality: N/A;   XI ROBOTIC ASSISTED VENTRAL HERNIA N/A 07/15/2021   Procedure: XI ROBOTIC ASSISTED INCISIONAL HERNIA REPAIR WITH MESH;  Surgeon: Quentin Ore, MD;  Location: [facility];  Service: General;  Laterality: N/A;   Family History  Family history unknown: Yes   Social History   Socioeconomic History   Marital status: Divorced    Spouse name: Not on file   Number of children: Not on file   Years of education: Not on file   Highest education level: Not on file  Occupational History   Not on file  Tobacco Use   Smoking status: Former    Types: Cigarettes    Quit date: 08/25/2019    Years since quitting: 2.9   Smokeless tobacco: Never  Vaping Use   Vaping Use: Never used  Substance and Sexual Activity   Alcohol use: Yes    Comment: 3-4 beers 12 oz beers   Drug use: Never   Sexual activity: Not Currently  Other Topics Concern   Not on file  Social History Narrative   Not on file   Social Determinants of Health   Financial Resource Strain: Low Risk  (08/05/2022)   Overall Financial Resource Strain (CARDIA)    Difficulty of Paying Living Expenses: Not hard at all  Food Insecurity: No Food Insecurity (08/05/2022)   Hunger Vital Sign    Worried About Radiation protection practitioner of Food in the Last Year: Never true    Ran Out of Food in the Last Year: Never true  Transportation Needs: No Transportation Needs (08/05/2022)   PRAPARE - Administrator, Civil Service (Medical): No    Lack of Transportation (Non-Medical): No  Physical Activity: Sufficiently Active (08/05/2022)   Exercise Vital Sign    Days of Exercise per Week: 7 days    Minutes of Exercise per Session: 30 min  Stress: No Stress Concern Present (08/05/2022)   [employer] of Occupational Health -  Occupational Stress Questionnaire    Feeling of Stress : Not at all  Social Connections: Socially Integrated (08/05/2022)   Social Connection and Isolation Panel [NHANES]    Frequency of Communication with Friends and Family: More than three times a week    Frequency of Social Gatherings with Friends and Family: More than three times a week    Attends Religious Services: More than 4 times per year    Active Member of [employer] or Organizations: Yes    Attends Engineer, structural: More than 4 times per year    Marital Status: Married    Tobacco Counseling Counseling given: Yes   Clinical Intake:  Pre-visit preparation completed: Yes  Pain : No/denies pain Pain Score: 0-No pain     BMI - recorded: 20.53 Nutritional Status: BMI of 19-24  Normal Nutritional Risks: None Diabetes: No  How often do you need to  have someone help you when you read instructions, pamphlets, or other written materials from your doctor or pharmacy?: 1 - Never  Diabetic?no  Interpreter Needed?: No  Information entered by ::  Cherree Conerly, CMA   Activities of Daily Living    08/05/2022   11:31 AM 03/18/2022    8:33 AM  In your present state of health, do you have any difficulty performing the following activities:  Hearing? 1 0  Comment referral placed to ENT for hearing difficulties and ringing in the ears   Vision? 1 0  Comment referral placed for eye doctor   Difficulty concentrating or making decisions? 0 0  Walking or climbing stairs? 0 0  Dressing or bathing? 0 0  Doing errands, shopping? 0 0  Preparing Food and eating ? N   Using the Toilet? N   In the past six months, have you accidently leaked urine? N   Do you have problems with loss of bowel control? N   Managing your Medications? N   Managing your Finances? N   Housekeeping or managing your Housekeeping? N     Patient Care Team: [place], Buren Kos, MD as PCP - General (Family Medicine) Radonna Ricker, RN (Inactive)  as Oncology Nurse Navigator  Indicate any recent Medical Services you may have received from other than [facility] providers in the past year (date may be approximate).     Assessment:   This is a routine wellness examination for M[place].  Hearing/Vision screen Hearing Screening - Comments:: Patient complains of hearing difficulties and ringing in ears bilaterally. Referral for ENT placed.  Vision Screening - Comments:: Patient complains of difficulties with vision. Referral placed today   Dietary issues and exercise activities discussed: Current Exercise Habits: Home exercise routine, Type of exercise: walking, Time (Minutes): 45, Frequency (Times/Week): 5, Weekly Exercise (Minutes/Week): 225, Intensity: Mild, Exercise limited by: orthopedic condition(s);Other - see comments (hx of colon cancer)   Goals Addressed             This Visit's Progress    Patient Stated       Patient states his goal is to "stay alive" for the year       Depression Screen    08/05/2022   11:28 AM 03/18/2022    8:33 AM 01/30/2022    1:34 PM 11/19/2021    8:17 AM 09/27/2021    8:32 AM 08/16/2021   11:41 AM 07/19/2021    8:18 AM  PHQ 2/9 Scores  PHQ - 2 Score 0 0 0 0 0 0 0  PHQ- 9 Score  0 0 0 0 0 0  Exception Documentation  Medical reason Medical reason Medical reason Medical reason      Fall Risk    08/05/2022   11:26 AM 03/18/2022    8:33 AM 01/30/2022    1:34 PM 11/19/2021    8:17 AM 09/27/2021    8:32 AM  Fall Risk   Falls in the past year? 1 0 1 0 1  Number falls in past yr: 1 0 0 0 1  Injury with Fall? 1 0 1 0 1  Risk for fall due to : Impaired balance/gait;Impaired mobility;History of fall(s) No Fall Risks History of fall(s) No Fall Risks History of fall(s)  Follow up Education provided;Falls prevention discussed Falls evaluation completed Falls evaluation completed Falls evaluation completed Falls evaluation completed    FALL RISK PREVENTION PERTAINING TO THE HOME:  Any stairs in or around  the home? No  If  so, are there any without handrails? No  Home free of loose throw rugs in walkways, pet beds, electrical cords, etc? Yes  Adequate lighting in your home to reduce risk of falls? Yes   ASSISTIVE DEVICES UTILIZED TO PREVENT FALLS:  Life alert? No  Use of a cane, walker or w/c? Yes  Grab bars in the bathroom? No  Shower chair or bench in shower? No  Elevated toilet seat or a handicapped toilet? No   TIMED UP AND GO:  Was the test performed? No .   Cognitive Function:        08/05/2022   11:33 AM 08/16/2021   11:53 AM  6CIT Screen  What Year? 0 points 0 points  What month? 0 points 0 points  What time? 0 points 0 points  Count back from 20 0 points 0 points  Months in reverse 0 points 2 points  Repeat phrase 0 points 4 points  Total Score 0 points 6 points    Immunizations  There is no immunization history on file for this patient.  TDAP status: Due, Education has been provided regarding the importance of this vaccine. Advised may receive this vaccine at local pharmacy or Health Dept. Aware to provide a copy of the vaccination record if obtained from local pharmacy or Health Dept. Verbalized acceptance and understanding.  Flu Vaccine status: Up to date  Pneumococcal vaccine status: Due, Education has been provided regarding the importance of this vaccine. Advised may receive this vaccine at local pharmacy or Health Dept. Aware to provide a copy of the vaccination record if obtained from local pharmacy or Health Dept. Verbalized acceptance and understanding.  Covid-19 vaccine status: Information provided on how to obtain vaccines.   Qualifies for Shingles Vaccine? Yes   Zostavax completed No   Shingrix Completed?: Yes  Screening Tests Health Maintenance  Topic Date Due   COVID-19 Vaccine (1) Never done   Hepatitis C Screening  Never done   DTaP/Tdap/Td (1 - Tdap) Never done   Zoster Vaccines- Shingrix (1 of 2) Never done   Pneumonia Vaccine 65+ Years  old (1 of 1 - PCV) Never done   INFLUENZA VACCINE  09/25/2022   Medicare Annual Wellness (AWV)  08/05/2023   Colonoscopy  04/21/2025   HPV VACCINES  Aged Out    Health Maintenance  Health Maintenance Due  Topic Date Due   COVID-19 Vaccine (1) Never done   Hepatitis C Screening  Never done   DTaP/Tdap/Td (1 - Tdap) Never done   Zoster Vaccines- Shingrix (1 of 2) Never done   Pneumonia Vaccine 47+ Years old (1 of 1 - PCV) Never done    Colorectal cancer screening: Type of screening: Colonoscopy. Completed 04/21/2022. Repeat every 2 years  Lung Cancer Screening: (Low Dose CT Chest recommended if Age 37-80 years, 30 pack-year currently smoking OR have quit w/in 15years.) does not qualify.   Additional Screening:  Hepatitis C Screening: does qualify; Ordered 08/05/2022  Vision Screening: Recommended annual ophthalmology exams for early detection of glaucoma and other disorders of the eye. Is the patient up to date with their annual eye exam?  No  Who is the provider or what is the name of the office in which the patient attends annual eye exams? Referral placed today If pt is not established with a provider, would they like to be referred to a provider to establish care? Yes .   Dental Screening: Recommended annual dental exams for proper oral hygiene  Community Resource Referral /  Chronic Care Management: CRR required this visit?  No   CCM required this visit?  No      Plan:     I have personally reviewed and noted the following in the patient's chart:   Medical and social history Use of alcohol, tobacco or illicit drugs  Current medications and supplements including opioid prescriptions. Patient is not currently taking opioid prescriptions. Functional ability and status Nutritional status Physical activity Advanced directives List of other physicians Hospitalizations, surgeries, and ER visits in previous 12 months Vitals Screenings to include cognitive, depression,  and falls Referrals and appointments  In addition, I have reviewed and discussed with patient certain preventive protocols, quality metrics, and best practice recommendations. A written personalized care plan for preventive services as well as general preventive health recommendations were provided to patient.   Due to this being a telephonic visit, the after visit summary with patients personalized plan was offered to patient via mail or my-chart. Patient would like to access their AVS via my-chart    Jordan Hawks Jenavie Stanczak, CMA   08/05/2022   Nurse Notes: Referrals placed for vision and hearing screens. Patient aware and in agreement with plan.

## 2022-09-15 ENCOUNTER — Emergency Department (HOSPITAL_COMMUNITY): Payer: Medicare Other

## 2022-09-15 ENCOUNTER — Other Ambulatory Visit: Payer: Self-pay

## 2022-09-15 ENCOUNTER — Inpatient Hospital Stay (HOSPITAL_COMMUNITY)
Admission: EM | Admit: 2022-09-15 | Discharge: 2022-09-28 | DRG: 964 | Disposition: A | Discharge status: Home / Self Care | Payer: Medicare Other | Attending: General Surgery | Admitting: General Surgery

## 2022-09-15 ENCOUNTER — Encounter (HOSPITAL_COMMUNITY): Payer: Self-pay

## 2022-09-15 DIAGNOSIS — Y838 Other surgical procedures as the cause of abnormal reaction of the patient, or of later complication, without mention of misadventure at the time of the procedure: Secondary | ICD-10-CM | POA: Diagnosis not present

## 2022-09-15 DIAGNOSIS — S2232XA Fracture of one rib, left side, initial encounter for closed fracture: Secondary | ICD-10-CM | POA: Diagnosis not present

## 2022-09-15 DIAGNOSIS — Z79899 Other long term (current) drug therapy: Secondary | ICD-10-CM | POA: Diagnosis not present

## 2022-09-15 DIAGNOSIS — J9811 Atelectasis: Secondary | ICD-10-CM | POA: Diagnosis not present

## 2022-09-15 DIAGNOSIS — S32302A Unspecified fracture of left ilium, initial encounter for closed fracture: Secondary | ICD-10-CM | POA: Diagnosis not present

## 2022-09-15 DIAGNOSIS — W19XXXA Unspecified fall, initial encounter: Secondary | ICD-10-CM | POA: Diagnosis not present

## 2022-09-15 DIAGNOSIS — I10 Essential (primary) hypertension: Secondary | ICD-10-CM | POA: Diagnosis present

## 2022-09-15 DIAGNOSIS — W01190A Fall on same level from slipping, tripping and stumbling with subsequent striking against furniture, initial encounter: Secondary | ICD-10-CM | POA: Diagnosis present

## 2022-09-15 DIAGNOSIS — F1010 Alcohol abuse, uncomplicated: Secondary | ICD-10-CM | POA: Diagnosis present

## 2022-09-15 DIAGNOSIS — I771 Stricture of artery: Secondary | ICD-10-CM | POA: Diagnosis not present

## 2022-09-15 DIAGNOSIS — E871 Hypo-osmolality and hyponatremia: Secondary | ICD-10-CM | POA: Diagnosis not present

## 2022-09-15 DIAGNOSIS — R0902 Hypoxemia: Secondary | ICD-10-CM | POA: Diagnosis present

## 2022-09-15 DIAGNOSIS — J95812 Postprocedural air leak: Secondary | ICD-10-CM | POA: Diagnosis not present

## 2022-09-15 DIAGNOSIS — S270XXA Traumatic pneumothorax, initial encounter: Principal | ICD-10-CM | POA: Diagnosis present

## 2022-09-15 DIAGNOSIS — J4489 Other specified chronic obstructive pulmonary disease: Secondary | ICD-10-CM | POA: Diagnosis not present

## 2022-09-15 DIAGNOSIS — S2242XA Multiple fractures of ribs, left side, initial encounter for closed fracture: Secondary | ICD-10-CM | POA: Diagnosis not present

## 2022-09-15 DIAGNOSIS — Z87891 Personal history of nicotine dependence: Secondary | ICD-10-CM | POA: Diagnosis not present

## 2022-09-15 DIAGNOSIS — J449 Chronic obstructive pulmonary disease, unspecified: Secondary | ICD-10-CM | POA: Diagnosis present

## 2022-09-15 DIAGNOSIS — S32315A Nondisplaced avulsion fracture of left ilium, initial encounter for closed fracture: Secondary | ICD-10-CM | POA: Diagnosis not present

## 2022-09-15 DIAGNOSIS — T797XXA Traumatic subcutaneous emphysema, initial encounter: Secondary | ICD-10-CM | POA: Diagnosis not present

## 2022-09-15 DIAGNOSIS — K8020 Calculus of gallbladder without cholecystitis without obstruction: Secondary | ICD-10-CM | POA: Diagnosis not present

## 2022-09-15 DIAGNOSIS — I82C19 Acute embolism and thrombosis of unspecified internal jugular vein: Secondary | ICD-10-CM | POA: Diagnosis not present

## 2022-09-15 DIAGNOSIS — R000 Tachycardia, unspecified: Secondary | ICD-10-CM | POA: Diagnosis not present

## 2022-09-15 DIAGNOSIS — N281 Cyst of kidney, acquired: Secondary | ICD-10-CM | POA: Diagnosis not present

## 2022-09-15 DIAGNOSIS — J939 Pneumothorax, unspecified: Secondary | ICD-10-CM | POA: Diagnosis present

## 2022-09-15 DIAGNOSIS — E876 Hypokalemia: Secondary | ICD-10-CM | POA: Diagnosis not present

## 2022-09-15 DIAGNOSIS — I77810 Thoracic aortic ectasia: Secondary | ICD-10-CM | POA: Diagnosis not present

## 2022-09-15 DIAGNOSIS — R062 Wheezing: Secondary | ICD-10-CM | POA: Diagnosis not present

## 2022-09-15 DIAGNOSIS — E875 Hyperkalemia: Secondary | ICD-10-CM | POA: Diagnosis not present

## 2022-09-15 DIAGNOSIS — Z4682 Encounter for fitting and adjustment of non-vascular catheter: Secondary | ICD-10-CM | POA: Diagnosis not present

## 2022-09-15 DIAGNOSIS — I700 Atherosclerosis of aorta: Secondary | ICD-10-CM | POA: Diagnosis not present

## 2022-09-15 DIAGNOSIS — Y92009 Unspecified place in unspecified non-institutional (private) residence as the place of occurrence of the external cause: Secondary | ICD-10-CM | POA: Diagnosis not present

## 2022-09-15 DIAGNOSIS — J982 Interstitial emphysema: Secondary | ICD-10-CM | POA: Diagnosis not present

## 2022-09-15 DIAGNOSIS — S2241XA Multiple fractures of ribs, right side, initial encounter for closed fracture: Secondary | ICD-10-CM | POA: Diagnosis not present

## 2022-09-15 DIAGNOSIS — R55 Syncope and collapse: Secondary | ICD-10-CM | POA: Diagnosis not present

## 2022-09-15 DIAGNOSIS — Z8711 Personal history of peptic ulcer disease: Secondary | ICD-10-CM

## 2022-09-15 DIAGNOSIS — Z85038 Personal history of other malignant neoplasm of large intestine: Secondary | ICD-10-CM | POA: Diagnosis not present

## 2022-09-15 DIAGNOSIS — R918 Other nonspecific abnormal finding of lung field: Secondary | ICD-10-CM | POA: Diagnosis not present

## 2022-09-15 DIAGNOSIS — J439 Emphysema, unspecified: Secondary | ICD-10-CM | POA: Diagnosis not present

## 2022-09-15 DIAGNOSIS — J90 Pleural effusion, not elsewhere classified: Secondary | ICD-10-CM | POA: Diagnosis not present

## 2022-09-15 LAB — HEPATIC FUNCTION PANEL
ALT: 23 U/L (ref 0–44)
AST: 44 U/L — ABNORMAL HIGH (ref 15–41)
Albumin: 3 g/dL — ABNORMAL LOW (ref 3.5–5.0)
Alkaline Phosphatase: 121 U/L (ref 38–126)
Bilirubin, Direct: 0.3 mg/dL — ABNORMAL HIGH (ref 0.0–0.2)
Indirect Bilirubin: 0.4 mg/dL (ref 0.3–0.9)
Total Bilirubin: 0.7 mg/dL (ref 0.3–1.2)
Total Protein: 7.1 g/dL (ref 6.5–8.1)

## 2022-09-15 LAB — BASIC METABOLIC PANEL
Anion gap: 17 — ABNORMAL HIGH (ref 5–15)
BUN: <5 mg/dL — ABNORMAL LOW (ref 8–23)
CO2: 18 mmol/L — ABNORMAL LOW (ref 22–32)
Calcium: 8.5 mg/dL — ABNORMAL LOW (ref 8.9–10.3)
Chloride: 106 mmol/L (ref 98–111)
Creatinine, Ser: 0.55 mg/dL — ABNORMAL LOW (ref 0.61–1.24)
GFR, Estimated: >60 mL/min (ref >60)
Glucose, Bld: 148 mg/dL — ABNORMAL HIGH (ref 70–99)
Potassium: 3.6 mmol/L (ref 3.5–5.1)
Sodium: 141 mmol/L (ref 135–145)

## 2022-09-15 LAB — COMPREHENSIVE METABOLIC PANEL
ALT: 20 U/L (ref 0–44)
AST: 40 U/L (ref 15–41)
Albumin: 2.7 g/dL — ABNORMAL LOW (ref 3.5–5.0)
Alkaline Phosphatase: 105 U/L (ref 38–126)
Anion gap: 15 (ref 5–15)
BUN: <5 mg/dL — ABNORMAL LOW (ref 8–23)
CO2: 18 mmol/L — ABNORMAL LOW (ref 22–32)
Calcium: 7.4 mg/dL — ABNORMAL LOW (ref 8.9–10.3)
Chloride: 106 mmol/L (ref 98–111)
Creatinine, Ser: 0.51 mg/dL — ABNORMAL LOW (ref 0.61–1.24)
GFR, Estimated: >60 mL/min (ref >60)
Glucose, Bld: 135 mg/dL — ABNORMAL HIGH (ref 70–99)
Potassium: 3.5 mmol/L (ref 3.5–5.1)
Sodium: 139 mmol/L (ref 135–145)
Total Bilirubin: 0.6 mg/dL (ref 0.3–1.2)
Total Protein: 6.3 g/dL — ABNORMAL LOW (ref 6.5–8.1)

## 2022-09-15 LAB — ETHANOL: Alcohol, Ethyl (B): 10 mg/dL — ABNORMAL HIGH (ref <10)

## 2022-09-15 LAB — CBC WITH DIFFERENTIAL/PLATELET
Abs Immature Granulocytes: 0.1 10*3/uL — ABNORMAL HIGH (ref 0.00–0.07)
Basophils Absolute: 0.1 10*3/uL (ref 0.0–0.1)
Basophils Relative: 0 %
Eosinophils Absolute: 0 10*3/uL (ref 0.0–0.5)
Eosinophils Relative: 0 %
HCT: 45.9 % (ref 39.0–52.0)
Hemoglobin: 15.7 g/dL (ref 13.0–17.0)
Immature Granulocytes: 1 %
Lymphocytes Relative: 5 %
Lymphs Abs: 1 10*3/uL (ref 0.7–4.0)
MCH: 35 pg — ABNORMAL HIGH (ref 26.0–34.0)
MCHC: 34.2 g/dL (ref 30.0–36.0)
MCV: 102.2 fL — ABNORMAL HIGH (ref 80.0–100.0)
Monocytes Absolute: 1.1 10*3/uL — ABNORMAL HIGH (ref 0.1–1.0)
Monocytes Relative: 6 %
Neutro Abs: 16.4 10*3/uL — ABNORMAL HIGH (ref 1.7–7.7)
Neutrophils Relative %: 88 %
Platelets: 285 10*3/uL (ref 150–400)
RBC: 4.49 MIL/uL (ref 4.22–5.81)
RDW: 12.8 % (ref 11.5–15.5)
WBC: 18.6 10*3/uL — ABNORMAL HIGH (ref 4.0–10.5)
nRBC: 0 % (ref 0.0–0.2)

## 2022-09-15 LAB — I-STAT CHEM 8, ED
BUN: <3 mg/dL — ABNORMAL LOW (ref 8–23)
Calcium, Ion: 0.94 mmol/L — ABNORMAL LOW (ref 1.15–1.40)
Chloride: 105 mmol/L (ref 98–111)
Creatinine, Ser: 0.7 mg/dL (ref 0.61–1.24)
Glucose, Bld: 123 mg/dL — ABNORMAL HIGH (ref 70–99)
HCT: 48 % (ref 39.0–52.0)
Hemoglobin: 16.3 g/dL (ref 13.0–17.0)
Potassium: 7 mmol/L (ref 3.5–5.1)
Sodium: 138 mmol/L (ref 135–145)
TCO2: 26 mmol/L (ref 22–32)

## 2022-09-15 LAB — HIV ANTIBODY (ROUTINE TESTING W REFLEX): HIV Screen 4th Generation wRfx: NONREACTIVE

## 2022-09-15 LAB — MAGNESIUM: Magnesium: 1.4 mg/dL — ABNORMAL LOW (ref 1.7–2.4)

## 2022-09-15 LAB — PHOSPHORUS: Phosphorus: 2.8 mg/dL (ref 2.5–4.6)

## 2022-09-15 MED ORDER — MORPHINE SULFATE (PF) 4 MG/ML IV SOLN: 2.0000 mg | Freq: Once | INTRAVENOUS | Status: AC

## 2022-09-15 MED ORDER — HYDRALAZINE HCL 20 MG/ML IJ SOLN: 10.0000 mg | INTRAMUSCULAR | Status: DC | PRN

## 2022-09-15 MED ORDER — AMLODIPINE BESYLATE 5 MG PO TABS
5.0000 mg | ORAL_TABLET | Freq: Every day | ORAL | Status: DC
  Administered 2022-09-16 – 2022-09-28 (×13): 5 mg via ORAL
  Filled 2022-09-15 (×13): qty 1

## 2022-09-15 MED ORDER — ONDANSETRON HCL 4 MG/2ML IJ SOLN
4.0000 mg | Freq: Four times a day (QID) | INTRAMUSCULAR | Status: DC | PRN
  Administered 2022-09-15: 4 mg via INTRAVENOUS
  Filled 2022-09-15: qty 2

## 2022-09-15 MED ORDER — MORPHINE SULFATE (PF) 2 MG/ML IV SOLN
2.0000 mg | Freq: Once | INTRAVENOUS | Status: DC
  Filled 2022-09-15: qty 1

## 2022-09-15 MED ORDER — PHENOBARBITAL SODIUM 130 MG/ML IJ SOLN
97.5000 mg | Freq: Three times a day (TID) | INTRAMUSCULAR | Status: DC
  Administered 2022-09-15 – 2022-09-16 (×3): 97.5 mg via INTRAVENOUS
  Filled 2022-09-15 (×3): qty 1

## 2022-09-15 MED ORDER — ONDANSETRON 4 MG PO TBDP: 4.0000 mg | ORAL_TABLET | Freq: Four times a day (QID) | ORAL | Status: DC | PRN

## 2022-09-15 MED ORDER — METOPROLOL TARTRATE 5 MG/5ML IV SOLN: 5.0000 mg | Freq: Four times a day (QID) | INTRAVENOUS | Status: DC | PRN

## 2022-09-15 MED ORDER — FOLIC ACID 1 MG PO TABS
1.0000 mg | ORAL_TABLET | Freq: Every day | ORAL | Status: DC
  Administered 2022-09-15 – 2022-09-28 (×14): 1 mg via ORAL
  Filled 2022-09-15 (×14): qty 1

## 2022-09-15 MED ORDER — GUAIFENESIN 200 MG PO TABS
200.0000 mg | ORAL_TABLET | Freq: Four times a day (QID) | ORAL | Status: DC
  Administered 2022-09-15 – 2022-09-28 (×51): 200 mg via ORAL
  Filled 2022-09-15 (×54): qty 1

## 2022-09-15 MED ORDER — LORAZEPAM 2 MG/ML IJ SOLN
0.5000 mg | INTRAMUSCULAR | Status: DC | PRN
  Administered 2022-09-15: 1 mg via INTRAVENOUS
  Filled 2022-09-15: qty 1

## 2022-09-15 MED ORDER — HYDROMORPHONE HCL 1 MG/ML IJ SOLN
0.5000 mg | Freq: Once | INTRAMUSCULAR | Status: AC
  Administered 2022-09-15: 0.5 mg via INTRAVENOUS
  Filled 2022-09-15: qty 1

## 2022-09-15 MED ORDER — MORPHINE SULFATE (PF) 2 MG/ML IV SOLN
2.0000 mg | Freq: Once | INTRAVENOUS | Status: AC
  Administered 2022-09-15: 2 mg via INTRAVENOUS

## 2022-09-15 MED ORDER — PHENOBARBITAL SODIUM 130 MG/ML IJ SOLN: 65.0000 mg | Freq: Three times a day (TID) | INTRAMUSCULAR | Status: DC

## 2022-09-15 MED ORDER — IOHEXOL 350 MG/ML SOLN: 75.0000 mL | Freq: Once | INTRAVENOUS | Status: DC | PRN

## 2022-09-15 MED ORDER — THIAMINE HCL 100 MG/ML IJ SOLN
100.0000 mg | Freq: Every day | INTRAMUSCULAR | Status: DC
  Filled 2022-09-15 (×2): qty 2

## 2022-09-15 MED ORDER — ADULT MULTIVITAMIN W/MINERALS CH
1.0000 | ORAL_TABLET | Freq: Every day | ORAL | Status: DC
  Administered 2022-09-15 – 2022-09-27 (×13): 1 via ORAL
  Filled 2022-09-15 (×14): qty 1

## 2022-09-15 MED ORDER — IPRATROPIUM-ALBUTEROL 0.5-2.5 (3) MG/3ML IN SOLN
3.0000 mL | Freq: Once | RESPIRATORY_TRACT | Status: AC
  Administered 2022-09-15: 3 mL via RESPIRATORY_TRACT
  Filled 2022-09-15: qty 3

## 2022-09-15 MED ORDER — TRAZODONE HCL 50 MG PO TABS
50.0000 mg | ORAL_TABLET | Freq: Every day | ORAL | Status: DC
  Administered 2022-09-15 – 2022-09-27 (×13): 50 mg via ORAL
  Filled 2022-09-15 (×13): qty 1

## 2022-09-15 MED ORDER — IPRATROPIUM-ALBUTEROL 0.5-2.5 (3) MG/3ML IN SOLN: 3.0000 mL | RESPIRATORY_TRACT | Status: DC | PRN

## 2022-09-15 MED ORDER — IOHEXOL 350 MG/ML SOLN
75.0000 mL | Freq: Once | INTRAVENOUS | Status: AC | PRN
  Administered 2022-09-15: 75 mL via INTRAVENOUS

## 2022-09-15 MED ORDER — HYDROMORPHONE HCL 1 MG/ML IJ SOLN
0.5000 mg | INTRAMUSCULAR | Status: DC | PRN
  Administered 2022-09-15 – 2022-09-18 (×3): 1 mg via INTRAVENOUS
  Filled 2022-09-15 (×3): qty 1

## 2022-09-15 MED ORDER — PANTOPRAZOLE SODIUM 40 MG PO TBEC
40.0000 mg | DELAYED_RELEASE_TABLET | Freq: Every day | ORAL | Status: DC
  Administered 2022-09-16 – 2022-09-28 (×13): 40 mg via ORAL
  Filled 2022-09-15 (×13): qty 1

## 2022-09-15 MED ORDER — MORPHINE SULFATE (PF) 2 MG/ML IV SOLN: 2.0000 mg | Freq: Once | INTRAVENOUS | Status: DC

## 2022-09-15 MED ORDER — ACETAMINOPHEN 500 MG PO TABS
1000.0000 mg | ORAL_TABLET | Freq: Four times a day (QID) | ORAL | Status: DC
  Administered 2022-09-16 – 2022-09-28 (×44): 1000 mg via ORAL
  Filled 2022-09-15 (×47): qty 2

## 2022-09-15 MED ORDER — MORPHINE SULFATE (PF) 4 MG/ML IV SOLN
4.0000 mg | INTRAVENOUS | Status: DC | PRN
  Administered 2022-09-15: 4 mg via INTRAVENOUS
  Filled 2022-09-15: qty 1

## 2022-09-15 MED ORDER — MORPHINE SULFATE (PF) 4 MG/ML IV SOLN
INTRAVENOUS | Status: AC
  Administered 2022-09-15: 2 mg via INTRAVENOUS
  Filled 2022-09-15: qty 1

## 2022-09-15 MED ORDER — CALCIUM GLUCONATE-NACL 1-0.675 GM/50ML-% IV SOLN
1.0000 g | Freq: Once | INTRAVENOUS | Status: AC
  Administered 2022-09-15: 1000 mg via INTRAVENOUS
  Filled 2022-09-15: qty 50

## 2022-09-15 MED ORDER — THIAMINE MONONITRATE 100 MG PO TABS
100.0000 mg | ORAL_TABLET | Freq: Every day | ORAL | Status: DC
  Administered 2022-09-15 – 2022-09-28 (×14): 100 mg via ORAL
  Filled 2022-09-15 (×14): qty 1

## 2022-09-15 MED ORDER — ENOXAPARIN SODIUM 30 MG/0.3ML IJ SOSY
30.0000 mg | PREFILLED_SYRINGE | Freq: Two times a day (BID) | INTRAMUSCULAR | Status: DC
  Administered 2022-09-16 – 2022-09-28 (×25): 30 mg via SUBCUTANEOUS
  Filled 2022-09-15 (×24): qty 0.3

## 2022-09-15 MED ORDER — METHOCARBAMOL 500 MG PO TABS
1000.0000 mg | ORAL_TABLET | Freq: Three times a day (TID) | ORAL | Status: DC
  Administered 2022-09-15 – 2022-09-28 (×39): 1000 mg via ORAL
  Filled 2022-09-15 (×40): qty 2

## 2022-09-15 MED ORDER — DOCUSATE SODIUM 100 MG PO CAPS
100.0000 mg | ORAL_CAPSULE | Freq: Two times a day (BID) | ORAL | Status: DC
  Administered 2022-09-16 – 2022-09-21 (×12): 100 mg via ORAL
  Filled 2022-09-15 (×23): qty 1

## 2022-09-15 MED ORDER — PHENOBARBITAL SODIUM 65 MG/ML IJ SOLN: 32.5000 mg | Freq: Three times a day (TID) | INTRAMUSCULAR | Status: DC

## 2022-09-15 MED ORDER — AMLODIPINE BESYLATE 5 MG PO TABS: 5.0000 mg | ORAL_TABLET | Freq: Every day | ORAL | Status: DC

## 2022-09-15 MED ORDER — OXYCODONE HCL 5 MG PO TABS
5.0000 mg | ORAL_TABLET | ORAL | Status: DC | PRN
  Administered 2022-09-16 – 2022-09-17 (×5): 10 mg via ORAL
  Administered 2022-09-17: 5 mg via ORAL
  Administered 2022-09-18 – 2022-09-22 (×16): 10 mg via ORAL
  Administered 2022-09-22: 5 mg via ORAL
  Administered 2022-09-22 – 2022-09-28 (×24): 10 mg via ORAL
  Filled 2022-09-15: qty 1
  Filled 2022-09-15 (×34): qty 2
  Filled 2022-09-15: qty 1
  Filled 2022-09-15 (×14): qty 2

## 2022-09-15 MED ORDER — POLYETHYLENE GLYCOL 3350 17 G PO PACK: 17.0000 g | PACK | Freq: Every day | ORAL | Status: DC | PRN

## 2022-09-15 NOTE — TOC CAGE-AID Note (Signed)
Transition of Care [facility]) - CAGE-AID Screening   Patient Details  Name: Marc Mcdonald MRN: [id number] Date of Birth: 1955-12-11  Hewitt Shorts, RN Trauma Response Nurse Phone Number: [phone] 09/15/2022, 5:46 PM   Clinical Narrative:  Pt has a lengthy hx of ETOH abuse-- drinks at least a 12 pack a day-- son states that is all he will get him daily. If he has more beer in the house he will drink it all.   CAGE-AID Screening:    Have You Ever Felt You Ought to Cut Down on Your Drinking or Drug Use?: No Have People Annoyed You By Critizing Your Drinking Or Drug Use?: Yes Have You Felt Bad Or Guilty About Your Drinking Or Drug Use?: Yes Have You Ever Had a Drink or Used Drugs First Thing In The Morning to Steady Your Nerves or to Get Rid of a Hangover?: Yes CAGE-AID Score: 3  Substance Abuse Education Offered: Yes (needs resource packet from [facility] on discharge please. Drinks a 12 pack a day/Son states that he "only lets him drink a 12 pack a day" He will drink whatever he can- per son)

## 2022-09-15 NOTE — ED Notes (Signed)
Pt placed on high flow salter at 8 LPM and able to maintain an oxygen saturation of 96%.

## 2022-09-15 NOTE — ED Notes (Signed)
Pt is unable to see out of both eyes.

## 2022-09-15 NOTE — H&P (Signed)
[facility] Surgery Admission Note  Marc Mcdonald May 22, 1955  [id number].    Requesting MD: Arby Barrette Chief Complaint/Reason for Consult: fall  HPI:  Marc Mcdonald is a 67 y.o. male PMH HTN, alcohol abuse, COPD, duodenal ulcer who presented to the ED today after suffering a ground level fall at home. States that he tripped and landed on a coffee table, striking his left chest. Denies hitting his head or any LOC.  He developed swelling in his face, arm, and abdomen/back. Worked up by EDP and found to have multiple left rib fractures with a pneumothorax. Chest tube placed by EDP. Trauma asked to see for admission.  Anticoagulants: none Former smoker, quit 6 years ago Daily alcohol use - 8-9 beers daily, denies prior h/o withdrawal Denies illicit drug use Employment: retired Lives at home with son Ambulates without assistive device   Family History  Family history unknown: Yes    Past Medical History:  Diagnosis Date   Arthritis    Colon cancer (HCC)    COPD (chronic obstructive pulmonary disease) (HCC)    Duodenal ulcer    Hypertension     Past Surgical History:  Procedure Laterality Date   BALLOON DILATION N/A 05/26/2020   Procedure: BALLOON DILATION;  Surgeon: Shellia Cleverly, DO;  Location: [facility] ENDOS[facility];  Service: Endoscopy;  Laterality: N/A;   BIOPSY  05/26/2020   Procedure: BIOPSY;  Surgeon: Shellia Cleverly, DO;  Location: [facility] ENDOSCOPY;  Service: Endoscopy;;   COLONOSCOPY WITH PROPOFOL N/A 05/26/2020   Procedure: COLONOSCOPY WITH PROPOFOL;  Surgeon: Shellia Cleverly, DO;  Location: [facility] ENDOSCOPY;  Service: Endoscopy;  Laterality: N/A;   COLOSTOMY N/A 05/30/2020   Procedure: COLOSTOMY;  Surgeon: Andria Meuse, MD;  Location: [facility] OR;  Service: General;  Laterality: N/A;   ESOPHAGOGASTRODUODENOSCOPY (EGD) WITH PROPOFOL N/A 05/26/2020   Procedure: ESOPHAGOGASTRODUODENOSCOPY (EGD) WITH PROPOFOL;  Surgeon: Shellia Cleverly, DO;  Location: [facility]  ENDOSCOPY;  Service: Endoscopy;  Laterality: N/A;   FLEXIBLE SIGMOIDOSCOPY N/A 02/13/2021   Procedure: FLEXIBLE SIGMOIDOSCOPY;  Surgeon: Andria Meuse, MD;  Location: [facility] ORS;  Service: General;  Laterality: N/A;   HERNIA REPAIR     PARASTOMAL HERNIA REPAIR N/A 02/13/2021   Procedure: PRIMARY REPAIR CLOSURE OF PARASTOMAL HERNIA, INTRAOPERATIVE ASSESSMENT OF PERFUSION;  Surgeon: Andria Meuse, MD;  Location: [facility] ORS;  Service: General;  Laterality: N/A;   SUBMUCOSAL TATTOO INJECTION  05/26/2020   Procedure: SUBMUCOSAL TATTOO INJECTION;  Surgeon: Shellia Cleverly, DO;  Location: [facility];  Service: Endoscopy;;   XI ROBOTIC ASSISTED COLOSTOMY TAKEDOWN N/A 02/13/2021   Procedure: XI ROBOTIC ASSISTED COLOSTOMY TAKEDOWN, TAP BLOCK;  Surgeon: Andria Meuse, MD;  Location: [facility] ORS;  Service: General;  Laterality: N/A;   XI ROBOTIC ASSISTED LOWER ANTERIOR RESECTION N/A 05/30/2020   Procedure: XI ROBOTIC ASSISTED LOWER ANTERIOR RESECTION WITH END COLOSTOMY CREATION;  Surgeon: Andria Meuse, MD;  Location: [facility] OR;  Service: General;  Laterality: N/A;   XI ROBOTIC ASSISTED VENTRAL HERNIA N/A 07/15/2021   Procedure: XI ROBOTIC ASSISTED INCISIONAL HERNIA REPAIR WITH MESH;  Surgeon: Quentin Ore, MD;  Location: [facility] ORS;  Service: General;  Laterality: N/A;    Social History:  reports that he quit smoking about 3 years ago. His smoking use included cigarettes. He has never used smokeless tobacco. He reports current alcohol use. He reports that he does not use drugs.  Allergies: No Known Allergies  (Not in a hospital admission)   Prior to Admission medications  Medication Sig Start Date End Date Taking? Authorizing Provider  acetaminophen (TYLENOL) 500 MG tablet Take 1,000 mg by mouth every 6 (six) hours as needed for mild pain or moderate pain.   Yes [provider]  amLODipine (NORVASC) 5 MG tablet Take 1 tablet (5 mg total) by mouth daily. 07/11/22  Yes de  [place], Raymond J, MD  pantoprazole (PROTONIX) 40 MG tablet TAKE 1 TABLET BY MOUTH EVERY DAY 07/08/22  Yes de [place], Raymond J, MD  Pediatric Multiple Vitamins (CHILDRENS MULTIVITAMIN) chewable tablet Chew 1 tablet by mouth daily. With iron   Yes [provider]  traZODone (DESYREL) 50 MG tablet TAKE 1 TABLET BY MOUTH EVERY DAY AT BEDTIME AS NEEDED FOR SLEEP Patient taking differently: Take 50 mg by mouth at bedtime. 07/08/22  Yes de [place], Raymond J, MD    Blood pressure 131/73, pulse (!) 111, temperature 97.8 F (36.6 C), resp. rate 19, height 5\' 8"  (1.727 m), weight 62 kg, SpO2 98%. Physical Exam: General: older than stated age male who is laying in bed in NAD HEENT: subcutaneous air in face. Sclera are noninjected.  Pupils equal and round.  Ears and nose without any masses or lesions.  Mouth is pink and moist. Dentition fair Heart: tachycardic 110.  Palpable radial and pedal pulses bilaterally  Lungs: work of breathing nonlabored on [place] Abd: soft, NT/ND, +BS, no masses, hernias, or organomegaly MS: no BLE edema, calves soft and nontender. Crepitus in LUE. Abrasion noted to left forearm Skin: warm and dry with no masses, lesions, or rashes Psych: A&Ox4 with an appropriate affect Neuro: MAEs, no gross motor or sensory deficits BUE/BLE  Results for orders placed or performed during the hospital encounter of 09/15/22 (from the past 48 hour(s))  CBC with Differential     Status: Abnormal   Collection Time: 09/15/22 12:21 PM  Result Value Ref Range   WBC 18.6 (H) 4.0 - 10.5 K/uL   RBC 4.49 4.22 - 5.81 MIL/uL   Hemoglobin 15.7 13.0 - 17.0 g/dL   HCT 08.6 57.8 - 46.9 %   MCV 102.2 (H) 80.0 - 100.0 fL   MCH 35.0 (H) 26.0 - 34.0 pg   MCHC 34.2 30.0 - 36.0 g/dL   RDW 62.9 52.8 - 41.3 %   Platelets 285 150 - 400 K/uL   nRBC 0.0 0.0 - 0.2 %   Neutrophils Relative % 88 %   Neutro Abs 16.4 (H) 1.7 - 7.7 K/uL   Lymphocytes Relative 5 %   Lymphs Abs 1.0 0.7 - 4.0 K/uL   Monocytes Relative  6 %   Monocytes Absolute 1.1 (H) 0.1 - 1.0 K/uL   Eosinophils Relative 0 %   Eosinophils Absolute 0.0 0.0 - 0.5 K/uL   Basophils Relative 0 %   Basophils Absolute 0.1 0.0 - 0.1 K/uL   Immature Granulocytes 1 %   Abs Immature Granulocytes 0.10 (H) 0.00 - 0.07 K/uL    Comment: Performed at [facility], [address], [place], [place] [address]  I-stat chem 8, ED (not at [facility], [facility] or [facility])     Status: Abnormal   Collection Time: 09/15/22 12:51 PM  Result Value Ref Range   Sodium 138 135 - 145 mmol/L   Potassium 7.0 (HH) 3.5 - 5.1 mmol/L   Chloride 105 98 - 111 mmol/L   BUN <3 (L) 8 - 23 mg/dL   Creatinine, Ser 0.27 0.61 - 1.24 mg/dL   Glucose, Bld 253 (H) 70 - 99 mg/dL  Comment: Glucose reference range applies only to samples taken after fasting for at least 8 hours.   Calcium, Ion 0.94 (L) 1.15 - 1.40 mmol/L   TCO2 26 22 - 32 mmol/L   Hemoglobin 16.3 13.0 - 17.0 g/dL   HCT 78.4 69.6 - 29.5 %   Comment NOTIFIED PHYSICIAN   Comprehensive metabolic panel     Status: Abnormal   Collection Time: 09/15/22  1:50 PM  Result Value Ref Range   Sodium 139 135 - 145 mmol/L   Potassium 3.5 3.5 - 5.1 mmol/L   Chloride 106 98 - 111 mmol/L   CO2 18 (L) 22 - 32 mmol/L   Glucose, Bld 135 (H) 70 - 99 mg/dL    Comment: Glucose reference range applies only to samples taken after fasting for at least 8 hours.   BUN <5 (L) 8 - 23 mg/dL   Creatinine, Ser 2.84 (L) 0.61 - 1.24 mg/dL   Calcium 7.4 (L) 8.9 - 10.3 mg/dL   Total Protein 6.3 (L) 6.5 - 8.1 g/dL   Albumin 2.7 (L) 3.5 - 5.0 g/dL   AST 40 15 - 41 U/L   ALT 20 0 - 44 U/L   Alkaline Phosphatase 105 38 - 126 U/L   Total Bilirubin 0.6 0.3 - 1.2 mg/dL   GFR, Estimated >13 >24 mL/min    Comment: (NOTE) Calculated using the CKD-EPI Creatinine Equation (2021)    Anion gap 15 5 - 15    Comment: Performed at [facility], [address] [place], [place] [address]   DG Chest Portable 1 View  Result Date: 09/15/2022 CLINICAL DATA:   Chest tube placement EXAM: PORTABLE CHEST 1 VIEW COMPARISON:  CT 09/15/2022, 01/23/2022 FINDINGS: Interval repositioning of left-sided chest tube, pigtail projects over left apex. Small left apical pneumothorax. Extensive soft tissue emphysema and pneumomediastinum. Acute left lower rib fractures. Normal cardiac size. IMPRESSION: 1. Interval repositioning of left-sided chest tube, pigtail projects over left apex. Small left apical pneumothorax. 2. Extensive soft tissue emphysema and pneumomediastinum. Electronically Signed   By: Jasmine Pang M.D.   On: 09/15/2022 15:20   DG Chest Portable 1 View  Result Date: 09/15/2022 CLINICAL DATA:  chest tube insertion EXAM: PORTABLE CHEST 1 VIEW COMPARISON:  CT scan chest from earlier the same day. FINDINGS: Status post left-sided pleural drainage catheter placement. The tip of the tube is overlapping the lateral margin of ribcage. Repositioning is recommended. There is small underlying left apical pneumothorax. Extensive surgical emphysema and pneumomediastinum again seen. Acute mildly displaced fracture of the left lower rib fracture is again seen. IMPRESSION: 1. Status post left-sided pleural drainage catheter placement. The tip of the tube is overlapping the lateral margin of rib cage. Repositioning is recommended. 2. Small underlying left apical pneumothorax. 3. Extensive surgical emphysema and pneumomediastinum. Electronically Signed   By: Jules Schick M.D.   On: 09/15/2022 15:01   CT CHEST ABDOMEN PELVIS W CONTRAST  Result Date: 09/15/2022 CLINICAL DATA:  Polytrauma, blunt EXAM: CT CHEST, ABDOMEN, AND PELVIS WITH CONTRAST TECHNIQUE: Multidetector CT imaging of the chest, abdomen and pelvis was performed following the standard protocol during bolus administration of intravenous contrast. RADIATION DOSE REDUCTION: This exam was performed according to the departmental dose-optimization program which includes automated exposure control, adjustment of the mA and/or  kV according to patient size and/or use of iterative reconstruction technique. CONTRAST:  75mL OMNIPAQUE IOHEXOL 350 MG/ML SOLN COMPARISON:  CT scan chest, abdomen and pelvis from 06/03/2021. FINDINGS: CT CHEST FINDINGS  Cardiovascular: Normal cardiac size. No pericardial effusion. There is shift of mediastinal structures to the right. Extensive pneumomediastinum noted. No aortic aneurysm. Mediastinum/Nodes: Visualized thyroid gland appears grossly unremarkable. No solid / cystic mediastinal masses. The esophagus is nondistended precluding optimal assessment. No axillary, mediastinal or hilar lymphadenopathy by size criteria. Lungs/Pleura: The central tracheo-bronchial tree is patent. There is small-to-moderate left pneumothorax, approximately 20% in size. There is extensive mediastinal air which also tracks along the mediastinal pleura of the right lung; Otherwise no right pneumothorax. There are patchy areas of linear, plate-like atelectasis and/or scarring throughout bilateral lungs. Trace left pleural effusion noted. No right pleural effusion. Bilateral lungs are otherwise clear. No consolidation or mass. Musculoskeletal: Extensive surgical emphysema noted along the chest with superior and inferior extension into the lower neck and upper abdomen. There are mildly displaced fractures of the left tenth and eleventh ribs along the posterolateral aspect. Several bilateral old healed fractures also seen. No acute vertebral compression fracture deformity. No suspicious osseous lesions. CT ABDOMEN PELVIS FINDINGS Hepatobiliary: The liver is normal in size. Non-cirrhotic configuration. No suspicious mass. These is mild diffuse hepatic steatosis. No intrahepatic or extrahepatic bile duct dilation. Physiologically distended gallbladder. There are dependent gall stones/sludge. Normal gallbladder wall thickness. No pericholecystic inflammatory changes. Pancreas: Unremarkable. No pancreatic ductal dilatation or surrounding  inflammatory changes. Spleen: Within normal limits. No focal lesion. Adrenals/Urinary Tract: Adrenal glands are unremarkable. No suspicious renal mass. There is a 1.1 x 1.1 cm simple cyst in the right kidney lower pole, anteriorly. No hydronephrosis. No renal or ureteric calculi. Unremarkable urinary bladder. Stomach/Bowel: There is a small sliding hiatal hernia. No disproportionate dilation of the small or large bowel loops. No evidence of abnormal bowel wall thickening or inflammatory changes. The appendix is unremarkable. Vascular/Lymphatic: No ascites or pneumoperitoneum. No abdominal or pelvic lymphadenopathy, by size criteria. No aneurysmal dilation of the major abdominal arteries. There are moderate peripheral atherosclerotic vascular calcifications of the aorta and its major branches. Reproductive: Normal size prostate. Symmetric seminal vesicles. Other: There is surgical emphysema along the abdominal wall extending into the scrotal sac. Musculoskeletal: No suspicious osseous lesions. There is linear undisplaced fracture (series 7, 171 and series 6, image 31) of the left iliac crest along the posterosuperior aspect. IMPRESSION: 1. Acute left tension pneumothorax secondary to mildly displaced left tenth and eleventh rib fractures. There is resultant extensive pneumomediastinum and surgical emphysema extending into the neck superiorly and up to the scrotum inferiorly. 2. There is linear undisplaced fracture of the left iliac crest along the posterosuperior aspect. 3. No other traumatic injury to the chest, abdomen or pelvis. Please see above for additional details. Critical Value/emergent results were called by telephone at the time of interpretation on 09/15/2022 at 1:19 pm to provider [facility] , who verbally acknowledged these results. Electronically Signed   By: Jules Schick M.D.   On: 09/15/2022 13:39   DG Chest Port 1 View  Result Date: 09/15/2022 CLINICAL DATA:  Trauma.  Subcutaneous air EXAM:  PORTABLE CHEST 1 VIEW COMPARISON:  01/23/2022 FINDINGS: Extensive chest wall gas identified bilaterally. Hyperinflation. There is presence of left-sided displaced rib fractures. Left-sided pneumothorax identified with visceral to parietal pleura estimated distance of 3.9 cm at the apex. No definite right-sided pneumothorax. No pleural effusion or consolidation. Normal cardiopericardial silhouette. Significant gas in the mediastinum as well. Tortuous aorta. Critical Value/emergent results were called by telephone at the time of interpretation on 09/15/2022 at 10:07 am to provider [facility] , who verbally acknowledged these results.  IMPRESSION: Extensive chest wall gas diffusely with a possible left-sided pneumothorax. Please correlate with follow up CT Left-sided rib fractures. Electronically Signed   By: Karen Kays M.D.   On: 09/15/2022 13:21      Assessment/Plan Fall L PTX with pneumomediastinum and extensive subcutaneous emphysema - s/p pigtail chest tube by EDP, small L apical PTX remains. Continue CT to -40cm. Repeat CXR in AM. L rib fxs 10-11 - multimodal pain control and pulm toilet Nondisplaced L iliac crest fx - discussed with ortho, WBAT, no need for f/u Hyperkalemia - normal on recheck. Check EKG Alcohol abuse - drinks 8-9 beers daily. CIWA, phenobarb protocol. Social work consult for SBIRT HTN - home med COPD - not on home O2 Former smoker - quit 6 years ago  ID - none VTE - SCDs, lovenox FEN - reg diet Foley - none  Dispo - Admit to 4np. PT/OT.  I reviewed ED provider notes, last 24 h vitals and pain scores, last 48 h intake and output, last 24 h labs and trends, and last 24 h imaging results.   Franne Forts, PA-C [facility] Surgery 09/15/2022, 3:58 PM Please see Amion for pager number during day hours 7:00am-4:30pm

## 2022-09-15 NOTE — ED Triage Notes (Addendum)
[name] from home after a fall when patient tripped on carpet, where he hit his left side on the coffee table. Pt c/o left sided pain where he struck the coffee table. Pt laid on couch for one hour and called EMS for swelling to the left arm and hand along with swelling to the testicles. En route to the hospital patient developed swelling to the face under both eyes, abdomen, back, and chest. Pt alert and oriented. When swollen areas are palpated it feels like crepitus. Pt was at 88% on room air, with 6 LPM brought up to 95%. Pt does not wear oxygen at baseline.  131/107 131 hr Cbg 131 95% 6 LPM

## 2022-09-15 NOTE — ED Notes (Signed)
ED TO INPATIENT HANDOFF REPORT  ED Nurse Name and Phone #: Jacqlyn Larsen Name/Age/Gender Marc Mcdonald 67 y.o. male Room/Bed: 023C/023C  Code Status   Code Status: Full Code  Home/SNF/Other Home Patient oriented to: self, place, time, and situation Is this baseline? Yes   Triage Complete: Triage complete  Chief Complaint Pneumothorax, left [J93.9]  Triage Note [name] from home after a fall when patient tripped on carpet, where he hit his left side on the coffee table. Pt c/o left sided pain where he struck the coffee table. Pt laid on couch for one hour and called EMS for swelling to the left arm and hand along with swelling to the testicles. En route to the hospital patient developed swelling to the face under both eyes, abdomen, back, and chest. Pt alert and oriented. When swollen areas are palpated it feels like crepitus. Pt was at 88% on room air, with 6 LPM brought up to 95%. Pt does not wear oxygen at baseline.  131/107 131 hr Cbg 131 95% 6 LPM   Allergies No Known Allergies  Level of Care/Admitting Diagnosis ED Disposition     ED Disposition  Admit   Condition  --   Comment  Hospital Area: [facility] [[id number]]  Level of Care: Progressive [102]  Admit to Progressive based on following criteria: RESPIRATORY PROBLEMS hypoxemic/hypercapnic respiratory failure that is responsive to NIPPV (BiPAP) or High Flow Nasal Cannula (6-80 lpm). Frequent assessment/intervention, no > Q2 hrs < Q4 hrs, to maintain oxygenation and pulmonary hygiene.  May admit patient to [facility] or [facility] if equivalent level of care is available:: No  Covid Evaluation: Asymptomatic - no recent exposure (last 10 days) testing not required  Diagnosis: Pneumothorax, left [284132]  Admitting Physician: TRAUMA MD [[id number]]  Attending Physician: TRAUMA MD [[id number]]  Certification:: I certify this patient will need inpatient services for at least 2 midnights  Estimated  Length of Stay: 4          B Medical/Surgery History Past Medical History:  Diagnosis Date   Arthritis    Colon cancer (HCC)    COPD (chronic obstructive pulmonary disease) (HCC)    Duodenal ulcer    Hypertension    Past Surgical History:  Procedure Laterality Date   BALLOON DILATION N/A 05/26/2020   Procedure: BALLOON DILATION;  Surgeon: Shellia Cleverly, DO;  Location: [facility];  Service: Endoscopy;  Laterality: N/A;   BIOPSY  05/26/2020   Procedure: BIOPSY;  Surgeon: Shellia Cleverly, DO;  Location: [facility];  Service: Endoscopy;;   COLONOSCOPY WITH PROPOFOL N/A 05/26/2020   Procedure: COLONOSCOPY WITH PROPOFOL;  Surgeon: Shellia Cleverly, DO;  Location: [facility];  Service: Endoscopy;  Laterality: N/A;   COLOSTOMY N/A 05/30/2020   Procedure: COLOSTOMY;  Surgeon: Andria Meuse, MD;  Location: [facility];  Service: General;  Laterality: N/A;   ESOPHAGOGASTRODUODENOSCOPY (EGD) WITH PROPOFOL N/A 05/26/2020   Procedure: ESOPHAGOGASTRODUODENOSCOPY (EGD) WITH PROPOFOL;  Surgeon: Shellia Cleverly, DO;  Location: [facility];  Service: Endoscopy;  Laterality: N/A;   FLEXIBLE SIGMOIDOSCOPY N/A 02/13/2021   Procedure: FLEXIBLE SIGMOIDOSCOPY;  Surgeon: Andria Meuse, MD;  Location: [facility];  Service: General;  Laterality: N/A;   HERNIA REPAIR     PARASTOMAL HERNIA REPAIR N/A 02/13/2021   Procedure: PRIMARY REPAIR CLOSURE OF PARASTOMAL HERNIA, INTRAOPERATIVE ASSESSMENT OF PERFUSION;  Surgeon: Andria Meuse, MD;  Location: [facility];  Service: General;  Laterality: N/A;   SUBMUCOSAL TATTOO  INJECTION  05/26/2020   Procedure: SUBMUCOSAL TATTOO INJECTION;  Surgeon: Shellia Cleverly, DO;  [facility]: [facility];  Service: Endoscopy;;   XI ROBOTIC ASSISTED COLOSTOMY TAKEDOWN N/A 02/13/2021   Procedure: XI ROBOTIC ASSISTED COLOSTOMY TAKEDOWN, TAP BLOCK;  Surgeon: Andria Meuse, MD;  Location: [facility];  Service: General;  Laterality: N/A;   XI ROBOTIC  ASSISTED LOWER ANTERIOR RESECTION N/A 05/30/2020   Procedure: XI ROBOTIC ASSISTED LOWER ANTERIOR RESECTION WITH END COLOSTOMY CREATION;  Surgeon: Andria Meuse, MD;  Location: [facility];  Service: General;  Laterality: N/A;   XI ROBOTIC ASSISTED VENTRAL HERNIA N/A 07/15/2021   Procedure: XI ROBOTIC ASSISTED INCISIONAL HERNIA REPAIR WITH MESH;  Surgeon: Quentin Ore, MD;  Location: [facility];  Service: General;  Laterality: N/A;     A IV Location/Drains/Wounds Patient Lines/Drains/Airways Status     Active Line/Drains/Airways     Name Placement date Placement time Site Days   Peripheral IV 09/15/22 18 G 1" Anterior;Proximal;Right Forearm 09/15/22  --  Forearm  less than 1   Peripheral IV 09/15/22 18 G 1" Anterior;Right Wrist 09/15/22  1355  Wrist  less than 1   Chest Tube Lateral;Left Pleural 14 Fr. 09/15/22  1400  Pleural  less than 1   Incision (Closed) 05/30/20 Abdomen Other (Comment) 05/30/20  1718  -- 838   Incision (Closed) 02/13/21 Abdomen Other (Comment) 02/13/21  1417  -- 579   Incision (Closed) 07/15/21 Abdomen 07/15/21  0805  -- 427   Incision - 4 Ports Abdomen 1: Right;Lateral;Lower 2: Right;Lateral;Upper 3: Right;Mid;Upper 4: Left;Upper;Lateral 05/30/20  1703  -- 838   Incision - 4 Ports Abdomen Right;Upper Right;Medial;Upper Medial;Left;Upper Left;Upper 07/15/21  1138  -- 427   Incision - 5 Ports Abdomen Right;Lateral Right;Medial Mid Left Left 02/13/21  1413  -- 579            Intake/Output Last 24 hours No intake or output data in the 24 hours ending 09/15/22 1949  Labs/Imaging Results for orders placed or performed during the hospital encounter of 09/15/22 (from the past 48 hour(s))  CBC with Differential     Status: Abnormal   Collection Time: 09/15/22 12:21 PM  Result Value Ref Range   WBC 18.6 (H) 4.0 - 10.5 K/uL   RBC 4.49 4.22 - 5.81 MIL/uL   Hemoglobin 15.7 13.0 - 17.0 g/dL   HCT 91.4 78.2 - 95.6 %   MCV 102.2 (H) 80.0 - 100.0 fL   MCH 35.0 (H)  26.0 - 34.0 pg   MCHC 34.2 30.0 - 36.0 g/dL   RDW 21.3 08.6 - 57.8 %   Platelets 285 150 - 400 K/uL   nRBC 0.0 0.0 - 0.2 %   Neutrophils Relative % 88 %   Neutro Abs 16.4 (H) 1.7 - 7.7 K/uL   Lymphocytes Relative 5 %   Lymphs Abs 1.0 0.7 - 4.0 K/uL   Monocytes Relative 6 %   Monocytes Absolute 1.1 (H) 0.1 - 1.0 K/uL   Eosinophils Relative 0 %   Eosinophils Absolute 0.0 0.0 - 0.5 K/uL   Basophils Relative 0 %   Basophils Absolute 0.1 0.0 - 0.1 K/uL   Immature Granulocytes 1 %   Abs Immature Granulocytes 0.10 (H) 0.00 - 0.07 K/uL    Comment: Performed at [facility], [address], [place], [place] [address]  I-stat chem 8, ED (not at [facility], [facility] or [facility])     Status: Abnormal   Collection Time: 09/15/22 12:51 PM  Result Value Ref Range   Sodium 138 135 - 145 mmol/L   Potassium 7.0 (HH) 3.5 - 5.1 mmol/L   Chloride 105 98 - 111 mmol/L   BUN <3 (L) 8 - 23 mg/dL   Creatinine, Ser 0.98 0.61 - 1.24 mg/dL   Glucose, Bld 119 (H) 70 - 99 mg/dL    Comment: Glucose reference range applies only to samples taken after fasting for at least 8 hours.   Calcium, Ion 0.94 (L) 1.15 - 1.40 mmol/L   TCO2 26 22 - 32 mmol/L   Hemoglobin 16.3 13.0 - 17.0 g/dL   HCT 14.7 82.9 - 56.2 %   Comment NOTIFIED PHYSICIAN   Comprehensive metabolic panel     Status: Abnormal   Collection Time: 09/15/22  1:50 PM  Result Value Ref Range   Sodium 139 135 - 145 mmol/L   Potassium 3.5 3.5 - 5.1 mmol/L   Chloride 106 98 - 111 mmol/L   CO2 18 (L) 22 - 32 mmol/L   Glucose, Bld 135 (H) 70 - 99 mg/dL    Comment: Glucose reference range applies only to samples taken after fasting for at least 8 hours.   BUN <5 (L) 8 - 23 mg/dL   Creatinine, Ser 1.30 (L) 0.61 - 1.24 mg/dL   Calcium 7.4 (L) 8.9 - 10.3 mg/dL   Total Protein 6.3 (L) 6.5 - 8.1 g/dL   Albumin 2.7 (L) 3.5 - 5.0 g/dL   AST 40 15 - 41 U/L   ALT 20 0 - 44 U/L   Alkaline Phosphatase 105 38 - 126 U/L   Total Bilirubin 0.6 0.3 - 1.2 mg/dL   GFR,  Estimated >86 >57 mL/min    Comment: (NOTE) Calculated using the CKD-EPI Creatinine Equation (2021)    Anion gap 15 5 - 15    Comment: Performed at [facility], [address], [place], [place] [address]  Ethanol     Status: Abnormal   Collection Time: 09/15/22  6:53 PM  Result Value Ref Range   Alcohol, Ethyl (B) 10 (H) <10 mg/dL    Comment: (NOTE) Lowest detectable limit for serum alcohol is 10 mg/dL.  For medical purposes only. Performed at [facility], [address] [place], [place] [address]    DG Chest Portable 1 View  Result Date: 09/15/2022 CLINICAL DATA:  Chest tube placement EXAM: PORTABLE CHEST 1 VIEW COMPARISON:  CT 09/15/2022, 01/23/2022 FINDINGS: Interval repositioning of left-sided chest tube, pigtail projects over left apex. Small left apical pneumothorax. Extensive soft tissue emphysema and pneumomediastinum. Acute left lower rib fractures. Normal cardiac size. IMPRESSION: 1. Interval repositioning of left-sided chest tube, pigtail projects over left apex. Small left apical pneumothorax. 2. Extensive soft tissue emphysema and pneumomediastinum. Electronically Signed   By: Jasmine Pang M.D.   On: 09/15/2022 15:20   DG Chest Portable 1 View  Result Date: 09/15/2022 CLINICAL DATA:  chest tube insertion EXAM: PORTABLE CHEST 1 VIEW COMPARISON:  CT scan chest from earlier the same day. FINDINGS: Status post left-sided pleural drainage catheter placement. The tip of the tube is overlapping the lateral margin of ribcage. Repositioning is recommended. There is small underlying left apical pneumothorax. Extensive surgical emphysema and pneumomediastinum again seen. Acute mildly displaced fracture of the left lower rib fracture is again seen. IMPRESSION: 1. Status post left-sided pleural drainage catheter placement. The tip of the tube is overlapping the lateral margin of rib cage. Repositioning is recommended. 2. Small underlying left apical pneumothorax. 3.  Extensive  surgical emphysema and pneumomediastinum. Electronically Signed   By: Jules Schick M.D.   On: 09/15/2022 15:01   CT CHEST ABDOMEN PELVIS W CONTRAST  Result Date: 09/15/2022 CLINICAL DATA:  Polytrauma, blunt EXAM: CT CHEST, ABDOMEN, AND PELVIS WITH CONTRAST TECHNIQUE: Multidetector CT imaging of the chest, abdomen and pelvis was performed following the standard protocol during bolus administration of intravenous contrast. RADIATION DOSE REDUCTION: This exam was performed according to the departmental dose-optimization program which includes automated exposure control, adjustment of the mA and/or kV according to patient size and/or use of iterative reconstruction technique. CONTRAST:  75mL OMNIPAQUE IOHEXOL 350 MG/ML SOLN COMPARISON:  CT scan chest, abdomen and pelvis from 06/03/2021. FINDINGS: CT CHEST FINDINGS Cardiovascular: Normal cardiac size. No pericardial effusion. There is shift of mediastinal structures to the right. Extensive pneumomediastinum noted. No aortic aneurysm. Mediastinum/Nodes: Visualized thyroid gland appears grossly unremarkable. No solid / cystic mediastinal masses. The esophagus is nondistended precluding optimal assessment. No axillary, mediastinal or hilar lymphadenopathy by size criteria. Lungs/Pleura: The central tracheo-bronchial tree is patent. There is small-to-moderate left pneumothorax, approximately 20% in size. There is extensive mediastinal air which also tracks along the mediastinal pleura of the right lung; Otherwise no right pneumothorax. There are patchy areas of linear, plate-like atelectasis and/or scarring throughout bilateral lungs. Trace left pleural effusion noted. No right pleural effusion. Bilateral lungs are otherwise clear. No consolidation or mass. Musculoskeletal: Extensive surgical emphysema noted along the chest with superior and inferior extension into the lower neck and upper abdomen. There are mildly displaced fractures of the left tenth and eleventh  ribs along the posterolateral aspect. Several bilateral old healed fractures also seen. No acute vertebral compression fracture deformity. No suspicious osseous lesions. CT ABDOMEN PELVIS FINDINGS Hepatobiliary: The liver is normal in size. Non-cirrhotic configuration. No suspicious mass. These is mild diffuse hepatic steatosis. No intrahepatic or extrahepatic bile duct dilation. Physiologically distended gallbladder. There are dependent gall stones/sludge. Normal gallbladder wall thickness. No pericholecystic inflammatory changes. Pancreas: Unremarkable. No pancreatic ductal dilatation or surrounding inflammatory changes. Spleen: Within normal limits. No focal lesion. Adrenals/Urinary Tract: Adrenal glands are unremarkable. No suspicious renal mass. There is a 1.1 x 1.1 cm simple cyst in the right kidney lower pole, anteriorly. No hydronephrosis. No renal or ureteric calculi. Unremarkable urinary bladder. Stomach/Bowel: There is a small sliding hiatal hernia. No disproportionate dilation of the small or large bowel loops. No evidence of abnormal bowel wall thickening or inflammatory changes. The appendix is unremarkable. Vascular/Lymphatic: No ascites or pneumoperitoneum. No abdominal or pelvic lymphadenopathy, by size criteria. No aneurysmal dilation of the major abdominal arteries. There are moderate peripheral atherosclerotic vascular calcifications of the aorta and its major branches. Reproductive: Normal size prostate. Symmetric seminal vesicles. Other: There is surgical emphysema along the abdominal wall extending into the scrotal sac. Musculoskeletal: No suspicious osseous lesions. There is linear undisplaced fracture (series 7, 171 and series 6, image 31) of the left iliac crest along the posterosuperior aspect. IMPRESSION: 1. Acute left tension pneumothorax secondary to mildly displaced left tenth and eleventh rib fractures. There is resultant extensive pneumomediastinum and surgical emphysema extending  into the neck superiorly and up to the scrotum inferiorly. 2. There is linear undisplaced fracture of the left iliac crest along the posterosuperior aspect. 3. No other traumatic injury to the chest, abdomen or pelvis. Please see above for additional details. Critical Value/emergent results were called by telephone at the time of interpretation on 09/15/2022 at 1:19 pm to provider [facility] , who verbally  acknowledged these results. Electronically Signed   By: Jules Schick M.D.   On: 09/15/2022 13:39   DG Chest Port 1 View  Result Date: 09/15/2022 CLINICAL DATA:  Trauma.  Subcutaneous air EXAM: PORTABLE CHEST 1 VIEW COMPARISON:  01/23/2022 FINDINGS: Extensive chest wall gas identified bilaterally. Hyperinflation. There is presence of left-sided displaced rib fractures. Left-sided pneumothorax identified with visceral to parietal pleura estimated distance of 3.9 cm at the apex. No definite right-sided pneumothorax. No pleural effusion or consolidation. Normal cardiopericardial silhouette. Significant gas in the mediastinum as well. Tortuous aorta. Critical Value/emergent results were called by telephone at the time of interpretation on 09/15/2022 at 10:07 am to provider [facility] , who verbally acknowledged these results. IMPRESSION: Extensive chest wall gas diffusely with a possible left-sided pneumothorax. Please correlate with follow up CT Left-sided rib fractures. Electronically Signed   By: Karen Kays M.D.   On: 09/15/2022 13:21    Pending Labs Unresulted Labs (From admission, onward)     Start     Ordered   09/16/22 0500  CBC  Tomorrow morning,   R        09/15/22 1629   09/16/22 0500  Basic metabolic panel  Tomorrow morning,   R        09/15/22 1629   09/15/22 1626  HIV Antibody (routine testing w rflx)  (HIV Antibody (Routine testing w reflex) panel)  Once,   R        09/15/22 1629   09/15/22 1511  Basic metabolic panel  Once,   STAT        09/15/22 1510   09/15/22 1511  Hepatic  function panel  Once,   URGENT        09/15/22 1510   09/15/22 1511  Magnesium  Once,   STAT        09/15/22 1510   09/15/22 1511  Phosphorus  Once,   STAT        09/15/22 1510            Vitals/Pain Today's Vitals   09/15/22 1944 09/15/22 1945 09/15/22 1945 09/15/22 1947  BP:  (!) 143/100    Pulse:  (!) 125    Resp: 15   (!) 22  Temp:  98 F (36.7 C)    TempSrc:  Oral    SpO2:  94%    Weight:      Height:      PainSc:   10-Worst pain ever     Isolation Precautions No active isolations  Medications Medications  iohexol (OMNIPAQUE) 350 MG/ML injection 75 mL (has no administration in time range)  ipratropium-albuterol (DUONEB) 0.5-2.5 (3) MG/3ML nebulizer solution 3 mL (has no administration in time range)  thiamine (VITAMIN B1) tablet 100 mg (100 mg Oral Given 09/15/22 1608)    Or  thiamine (VITAMIN B1) injection 100 mg ( Intravenous See Alternative 09/15/22 1608)  folic acid (FOLVITE) tablet 1 mg (1 mg Oral Given 09/15/22 1608)  multivitamin with minerals tablet 1 tablet (1 tablet Oral Given 09/15/22 1608)  LORazepam (ATIVAN) injection 0.5-1 mg (has no administration in time range)  pantoprazole (PROTONIX) EC tablet 40 mg (has no administration in time range)  traZODone (DESYREL) tablet 50 mg (has no administration in time range)  guaiFENesin tablet 200 mg (200 mg Oral Given 09/15/22 1658)  acetaminophen (TYLENOL) tablet 1,000 mg (0 mg Oral Hold 09/15/22 1742)  docusate sodium (COLACE) capsule 100 mg (has no administration in time range)  polyethylene glycol (MIRALAX /  GLYCOLAX) packet 17 g (has no administration in time range)  ondansetron (ZOFRAN-ODT) disintegrating tablet 4 mg ( Oral See Alternative 09/15/22 1738)    Or  ondansetron (ZOFRAN) injection 4 mg (4 mg Intravenous Given 09/15/22 1738)  metoprolol tartrate (LOPRESSOR) injection 5 mg (has no administration in time range)  hydrALAZINE (APRESOLINE) injection 10 mg (has no administration in time range)  enoxaparin  (LOVENOX) injection 30 mg (has no administration in time range)  methocarbamol (ROBAXIN) tablet 1,000 mg (1,000 mg Oral Given 09/15/22 1658)  HYDROmorphone (DILAUDID) injection 0.5-1 mg (has no administration in time range)  oxyCODONE (Oxy IR/ROXICODONE) immediate release tablet 5-10 mg (has no administration in time range)  PHENObarbital (LUMINAL) injection 97.5 mg (97.5 mg Intravenous Given 09/15/22 1703)    Followed by  PHENObarbital (LUMINAL) injection 65 mg (has no administration in time range)    Followed by  PHENObarbital (LUMINAL) injection 32.5 mg (has no administration in time range)  amLODipine (NORVASC) tablet 5 mg (has no administration in time range)  ipratropium-albuterol (DUONEB) 0.5-2.5 (3) MG/3ML nebulizer solution 3 mL (3 mLs Nebulization Given 09/15/22 1246)  HYDROmorphone (DILAUDID) injection 0.5 mg (0.5 mg Intravenous Given 09/15/22 1246)  iohexol (OMNIPAQUE) 350 MG/ML injection 75 mL (75 mLs Intravenous Contrast Given 09/15/22 1302)  morphine (PF) 2 MG/ML injection 2 mg (2 mg Intravenous Given 09/15/22 1400)  calcium gluconate 1 g/ 50 mL sodium chloride IVPB (0 mg Intravenous Stopped 09/15/22 1730)  morphine (PF) 4 MG/ML injection 2 mg (2 mg Intravenous Given 09/15/22 1350)    Mobility walks with device     Focused Assessments Pulmonary Assessment Handoff:  Lung sounds: Bilateral Breath Sounds: Diminished L Breath Sounds: Expiratory wheezes R Breath Sounds: Clear O2 Device: High Flow Nasal Cannula O2 Flow Rate (L/min): 8 L/min    R Recommendations: See Admitting Provider Note  Report given to:   Additional Notes: Pt has left sided chest tube in place, generalized crepitus, remains on 8L HFNC at this time.

## 2022-09-15 NOTE — ED Notes (Signed)
Trauma Event Note   Pt with hypoxia after fall this am. Non activated trauma-- tripped this am, crepitus in chest on arrival per primary RN, has increased to back and face.  Family is at the bedside. Dr. Donnald Garre going to place chest tube,      Last imported Vital Signs BP 116/73   Pulse (!) 130   Temp 97.8 F (36.6 C)   Resp (!) 28   Ht 5\' 8"  (1.727 m)   Wt 136 lb 11 oz (62 kg)   SpO2 98%   BMI 20.78 kg/m   Trending CBC Recent Labs    09/15/22 1221 09/15/22 1251  WBC 18.6*  --   HGB 15.7 16.3  HCT 45.9 48.0  PLT 285  --     Trending Coag's No results for input(s): "APTT", "INR" in the last 72 hours.  Trending BMET Recent Labs    09/15/22 1251  NA 138  K 7.0*  CL 105  BUN <3*  CREATININE 0.70  GLUCOSE 123*      Maham Quintin M Lakendrick Paradis  Trauma Response RN  Please call TRN at [phone] for further assistance.

## 2022-09-15 NOTE — ED Notes (Signed)
Patient having episodes of nausea and gagging, PRN medication given. See [facility]

## 2022-09-15 NOTE — ED Notes (Addendum)
Time out: 1345 Procedure begin : 1350 chest tube insertion for pneumothorax

## 2022-09-15 NOTE — ED Notes (Signed)
Pt oxygen saturation decreased to 78% on 6 LPM via nasal cannula. Pt placed on non-rebreather and oxygen saturation increased 89%.

## 2022-09-15 NOTE — ED Notes (Signed)
Pt transported to Ct with primary RN

## 2022-09-15 NOTE — ED Provider Notes (Signed)
[place] [facility]ERGENCY DEPARTMENT AT [facility] Provider Note   CSN: [id number] Arrival date & time: 09/15/22  1213     History  Chief Complaint  Patient presents with   Fall   Facial Swelling   Groin Swelling   Left arm swelling    Marc Mcdonald is a 67 y.o. male.  HPI Patient reports that he was in his home and tripped and fell landing on a coffee table.  He landed on the left side of his chest.  He reports he has pain on the left side of his chest but he also noticed that he started to get extremely swollen all over the place like he had big pectoralis muscles.  He does feeling more short of breath.  He does have history of COPD and prior smoking history.  He denies any hit his head.  He denies losing consciousness.  He denies neck pain he denies focal weakness numbness or tingling of extremities.  He does not take anticoagulants.  He does endorse drinking 8-10 beers per day.    Home Medications Prior to Admission medications   Medication Sig Start Date End Date Taking? Authorizing Provider  acetaminophen (TYLENOL) 500 MG tablet Take 1,000 mg by mouth every 6 (six) hours as needed for mild pain or moderate pain.   Yes [provider]  amLODipine (NORVASC) 5 MG tablet Take 1 tablet (5 mg total) by mouth daily. 07/11/22  Yes de [place], Raymond J, MD  pantoprazole (PROTONIX) 40 MG tablet TAKE 1 TABLET BY MOUTH EVERY DAY 07/08/22  Yes de [place], Raymond J, MD  Pediatric Multiple Vitamins (CHILDRENS MULTIVITAMIN) chewable tablet Chew 1 tablet by mouth daily. With iron   Yes [provider]  traZODone (DESYREL) 50 MG tablet TAKE 1 TABLET BY MOUTH EVERY DAY AT BEDTIME AS NEEDED FOR SLEEP Patient taking differently: Take 50 mg by mouth at bedtime. 07/08/22  Yes de [place], Raymond J, MD      Allergies    Patient has no known allergies.    Review of Systems   Review of Systems  Physical Exam Updated Vital Signs BP (!) 190/87   Pulse (!) 112   Temp 97.9 F  (36.6 C) (Oral)   Resp 20   Ht 5\' 8"  (1.727 m)   Wt 62 kg   SpO2 98%   BMI 20.78 kg/m  Physical Exam Constitutional:      Comments: Arrives emergency department alert.  Speaking in short full sentences tachypneic.  Patient has already visibly large amount of diffuse swelling of subcu emphysema of his face and neck.  HENT:     Head: Normocephalic and atraumatic.     Mouth/Throat:     Pharynx: Oropharynx is clear.  Eyes:     Extraocular Movements: Extraocular movements intact.  Neck:     Comments: No midline C-spine tenderness. Cardiovascular:     Rate and Rhythm: Regular rhythm. Tachycardia present.  Pulmonary:     Comments: Tachypnea with moderate increased work of breathing.  Patient is continuing to generate near constant conversation with short full sentences.  There are breath sounds audible bilaterally.  Question whether or not decreased on the left.  Patient has significant reproducible pain to palpation on the left lateral chest wall.  Extensive swelling of subcu emphysema throughout the anterior chest and neck as well as tracking into the arms. Abdominal:     Palpations: Abdomen is soft.     Tenderness: There is no abdominal tenderness.  Musculoskeletal:        General: Normal range of motion.     Comments: Multiple superficial skin tears left upper extremity.  No deformity lower extremities.  Skin:    General: Skin is warm and dry.  Neurological:     General: No focal deficit present.     Mental Status: He is oriented to person, place, and time.     Comments: No focal weakness.     ED Results / Procedures / Treatments   Labs (all labs ordered are listed, but only abnormal results are displayed) Labs Reviewed  CBC WITH DIFFERENTIAL/PLATELET - Abnormal; Notable for the following components:      Result Value   WBC 18.6 (*)    MCV 102.2 (*)    MCH 35.0 (*)    Neutro Abs 16.4 (*)    Monocytes Absolute 1.1 (*)    Abs Immature Granulocytes 0.10 (*)    All other  components within normal limits  COMPREHENSIVE METABOLIC PANEL - Abnormal; Notable for the following components:   CO2 18 (*)    Glucose, Bld 135 (*)    BUN <5 (*)    Creatinine, Ser 0.51 (*)    Calcium 7.4 (*)    Total Protein 6.3 (*)    Albumin 2.7 (*)    All other components within normal limits  I-STAT CHEM 8, ED - Abnormal; Notable for the following components:   Potassium 7.0 (*)    BUN <3 (*)    Glucose, Bld 123 (*)    Calcium, Ion 0.94 (*)    All other components within normal limits  ETHANOL  BASIC METABOLIC PANEL  HEPATIC FUNCTION PANEL  MAGNESIUM  PHOSPHORUS  HIV ANTIBODY (ROUTINE TESTING W REFLEX)  CBC  BASIC METABOLIC PANEL    EKG EKG Interpretation Date/Time:  Monday September 15 2022 16:19:59 EDT Ventricular Rate:  112 PR Interval:  163 QRS Duration:  81 QT Interval:  354 QTC Calculation: 484 R Axis:   75  Text Interpretation: Sinus tachycardia Low voltage, precordial leads no sig change from previous Confirmed by Arby Barrette [phone]) on 09/15/2022 5:01:20 PM  Radiology DG Chest Portable 1 View  Result Date: 09/15/2022 CLINICAL DATA:  Chest tube placement EXAM: PORTABLE CHEST 1 VIEW COMPARISON:  CT 09/15/2022, 01/23/2022 FINDINGS: Interval repositioning of left-sided chest tube, pigtail projects over left apex. Small left apical pneumothorax. Extensive soft tissue emphysema and pneumomediastinum. Acute left lower rib fractures. Normal cardiac size. IMPRESSION: 1. Interval repositioning of left-sided chest tube, pigtail projects over left apex. Small left apical pneumothorax. 2. Extensive soft tissue emphysema and pneumomediastinum. Electronically Signed   By: Jasmine Pang M.D.   On: 09/15/2022 15:20   DG Chest Portable 1 View  Result Date: 09/15/2022 CLINICAL DATA:  chest tube insertion EXAM: PORTABLE CHEST 1 VIEW COMPARISON:  CT scan chest from earlier the same day. FINDINGS: Status post left-sided pleural drainage catheter placement. The tip of the tube is  overlapping the lateral margin of ribcage. Repositioning is recommended. There is small underlying left apical pneumothorax. Extensive surgical emphysema and pneumomediastinum again seen. Acute mildly displaced fracture of the left lower rib fracture is again seen. IMPRESSION: 1. Status post left-sided pleural drainage catheter placement. The tip of the tube is overlapping the lateral margin of rib cage. Repositioning is recommended. 2. Small underlying left apical pneumothorax. 3. Extensive surgical emphysema and pneumomediastinum. Electronically Signed   By: Jules Schick M.D.   On: 09/15/2022 15:01   CT CHEST ABDOMEN PELVIS  W CONTRAST  Result Date: 09/15/2022 CLINICAL DATA:  Polytrauma, blunt EXAM: CT CHEST, ABDOMEN, AND PELVIS WITH CONTRAST TECHNIQUE: Multidetector CT imaging of the chest, abdomen and pelvis was performed following the standard protocol during bolus administration of intravenous contrast. RADIATION DOSE REDUCTION: This exam was performed according to the departmental dose-optimization program which includes automated exposure control, adjustment of the mA and/or kV according to patient size and/or use of iterative reconstruction technique. CONTRAST:  75mL OMNIPAQUE IOHEXOL 350 MG/ML SOLN COMPARISON:  CT scan chest, abdomen and pelvis from 06/03/2021. FINDINGS: CT CHEST FINDINGS Cardiovascular: Normal cardiac size. No pericardial effusion. There is shift of mediastinal structures to the right. Extensive pneumomediastinum noted. No aortic aneurysm. Mediastinum/Nodes: Visualized thyroid gland appears grossly unremarkable. No solid / cystic mediastinal masses. The esophagus is nondistended precluding optimal assessment. No axillary, mediastinal or hilar lymphadenopathy by size criteria. Lungs/Pleura: The central tracheo-bronchial tree is patent. There is small-to-moderate left pneumothorax, approximately 20% in size. There is extensive mediastinal air which also tracks along the mediastinal  pleura of the right lung; Otherwise no right pneumothorax. There are patchy areas of linear, plate-like atelectasis and/or scarring throughout bilateral lungs. Trace left pleural effusion noted. No right pleural effusion. Bilateral lungs are otherwise clear. No consolidation or mass. Musculoskeletal: Extensive surgical emphysema noted along the chest with superior and inferior extension into the lower neck and upper abdomen. There are mildly displaced fractures of the left tenth and eleventh ribs along the posterolateral aspect. Several bilateral old healed fractures also seen. No acute vertebral compression fracture deformity. No suspicious osseous lesions. CT ABDOMEN PELVIS FINDINGS Hepatobiliary: The liver is normal in size. Non-cirrhotic configuration. No suspicious mass. These is mild diffuse hepatic steatosis. No intrahepatic or extrahepatic bile duct dilation. Physiologically distended gallbladder. There are dependent gall stones/sludge. Normal gallbladder wall thickness. No pericholecystic inflammatory changes. Pancreas: Unremarkable. No pancreatic ductal dilatation or surrounding inflammatory changes. Spleen: Within normal limits. No focal lesion. Adrenals/Urinary Tract: Adrenal glands are unremarkable. No suspicious renal mass. There is a 1.1 x 1.1 cm simple cyst in the right kidney lower pole, anteriorly. No hydronephrosis. No renal or ureteric calculi. Unremarkable urinary bladder. Stomach/Bowel: There is a small sliding hiatal hernia. No disproportionate dilation of the small or large bowel loops. No evidence of abnormal bowel wall thickening or inflammatory changes. The appendix is unremarkable. Vascular/Lymphatic: No ascites or pneumoperitoneum. No abdominal or pelvic lymphadenopathy, by size criteria. No aneurysmal dilation of the major abdominal arteries. There are moderate peripheral atherosclerotic vascular calcifications of the aorta and its major branches. Reproductive: Normal size prostate.  Symmetric seminal vesicles. Other: There is surgical emphysema along the abdominal wall extending into the scrotal sac. Musculoskeletal: No suspicious osseous lesions. There is linear undisplaced fracture (series 7, 171 and series 6, image 31) of the left iliac crest along the posterosuperior aspect. IMPRESSION: 1. Acute left tension pneumothorax secondary to mildly displaced left tenth and eleventh rib fractures. There is resultant extensive pneumomediastinum and surgical emphysema extending into the neck superiorly and up to the scrotum inferiorly. 2. There is linear undisplaced fracture of the left iliac crest along the posterosuperior aspect. 3. No other traumatic injury to the chest, abdomen or pelvis. Please see above for additional details. Critical Value/emergent results were called by telephone at the time of interpretation on 09/15/2022 at 1:19 pm to provider [facility] , who verbally acknowledged these results. Electronically Signed   By: Jules Schick M.D.   On: 09/15/2022 13:39   DG Chest [facility] 1 View  Result  Date: 09/15/2022 CLINICAL DATA:  Trauma.  Subcutaneous air EXAM: PORTABLE CHEST 1 VIEW COMPARISON:  01/23/2022 FINDINGS: Extensive chest wall gas identified bilaterally. Hyperinflation. There is presence of left-sided displaced rib fractures. Left-sided pneumothorax identified with visceral to parietal pleura estimated distance of 3.9 cm at the apex. No definite right-sided pneumothorax. No pleural effusion or consolidation. Normal cardiopericardial silhouette. Significant gas in the mediastinum as well. Tortuous aorta. Critical Value/emergent results were called by telephone at the time of interpretation on 09/15/2022 at 10:07 am to provider [facility] , who verbally acknowledged these results. IMPRESSION: Extensive chest wall gas diffusely with a possible left-sided pneumothorax. Please correlate with follow up CT Left-sided rib fractures. Electronically Signed   By: Karen Kays M.D.    On: 09/15/2022 13:21    Procedures CHEST TUBE INSERTION  Date/Time: 09/15/2022 2:36 PM  Performed by: Arby Barrette, MD Authorized by: Arby Barrette, MD   Consent:    Consent obtained:  Verbal and emergent situation   Consent given by:  Patient   Risks discussed:  Bleeding, incomplete drainage, nerve damage, infection, pain and damage to surrounding structures Universal protocol:    Patient identity confirmed:  Verbally with patient Pre-procedure details:    Skin preparation:  Chlorhexidine   Preparation: Patient was prepped and draped in the usual sterile fashion   Sedation:    Sedation type:  None Anesthesia:    Anesthesia method:  Local infiltration   Local anesthetic:  Lidocaine 1% w/o epi Procedure details:    Placement location:  L lateral   Scalpel size:  11   Tube size (Fr):  Minicatheter   Ultrasound guidance: no     Tension pneumothorax: no     Tube connected to:  Suction   Drainage characteristics:  Air only   Suture material:  0 silk Post-procedure details:    Post-insertion x-ray findings: tube repositioned     Procedure completion:  Tolerated well, no immediate complications Comments:     On first attempt, thorax was accessed with large gauge needle and air return, wire threaded appropriately.  However threading the pigtail catheter after dilation, catheter to meet resistance. Placement was checked on x-ray,  catheter identified to be coiled in soft tissues.  Sterile barrier maintained and catheter removed, new kit used to reaccess same location with wire and upon second attempt pigtail catheter threaded appropriately over wire into the thorax with air return from the chest tube.  Confirmed on x-ray for tube placement.  Patient tolerated well.  No episodes of desaturation or dysrhythmia.  Heart rate improved and oxygen saturation improved.    CRITICAL CARE Performed by: Arby Barrette   Total critical care time: 30 minutes  Critical care time was  exclusive of separately billable procedures and treating other patients.  Critical care was necessary to treat or prevent imminent or life-threatening deterioration.  Critical care was time spent personally by me on the following activities: development of treatment plan with patient and/or surrogate as well as nursing, discussions with consultants, evaluation of patient's response to treatment, examination of patient, obtaining history from patient or surrogate, ordering and performing treatments and interventions, ordering and review of laboratory studies, ordering and review of radiographic studies, pulse oximetry and re-evaluation of patient's condition.    Medications Ordered in ED Medications  iohexol (OMNIPAQUE) 350 MG/ML injection 75 mL (has no administration in time range)  calcium gluconate 1 g/ 50 mL sodium chloride IVPB (1,000 mg Intravenous New Bag/Given 09/15/22 1622)  ipratropium-albuterol (DUONEB) 0.5-2.5 (3)  MG/3ML nebulizer solution 3 mL (has no administration in time range)  thiamine (VITAMIN B1) tablet 100 mg (100 mg Oral Given 09/15/22 1608)    Or  thiamine (VITAMIN B1) injection 100 mg ( Intravenous See Alternative 09/15/22 1608)  folic acid (FOLVITE) tablet 1 mg (1 mg Oral Given 09/15/22 1608)  multivitamin with minerals tablet 1 tablet (1 tablet Oral Given 09/15/22 1608)  LORazepam (ATIVAN) injection 0.5-1 mg (has no administration in time range)  pantoprazole (PROTONIX) EC tablet 40 mg (has no administration in time range)  traZODone (DESYREL) tablet 50 mg (has no administration in time range)  guaiFENesin tablet 200 mg (200 mg Oral Given 09/15/22 1658)  acetaminophen (TYLENOL) tablet 1,000 mg (has no administration in time range)  docusate sodium (COLACE) capsule 100 mg (has no administration in time range)  polyethylene glycol (MIRALAX / GLYCOLAX) packet 17 g (has no administration in time range)  ondansetron (ZOFRAN-ODT) disintegrating tablet 4 mg (has no administration  in time range)    Or  ondansetron (ZOFRAN) injection 4 mg (has no administration in time range)  metoprolol tartrate (LOPRESSOR) injection 5 mg (has no administration in time range)  hydrALAZINE (APRESOLINE) injection 10 mg (has no administration in time range)  enoxaparin (LOVENOX) injection 30 mg (has no administration in time range)  methocarbamol (ROBAXIN) tablet 1,000 mg (1,000 mg Oral Given 09/15/22 1658)  HYDROmorphone (DILAUDID) injection 0.5-1 mg (has no administration in time range)  oxyCODONE (Oxy IR/ROXICODONE) immediate release tablet 5-10 mg (has no administration in time range)  PHENObarbital (LUMINAL) injection 97.5 mg (97.5 mg Intravenous Given 09/15/22 1703)    Followed by  PHENObarbital (LUMINAL) injection 65 mg (has no administration in time range)    Followed by  PHENObarbital (LUMINAL) injection 32.5 mg (has no administration in time range)  amLODipine (NORVASC) tablet 5 mg (has no administration in time range)  ipratropium-albuterol (DUONEB) 0.5-2.5 (3) MG/3ML nebulizer solution 3 mL (3 mLs Nebulization Given 09/15/22 1246)  HYDROmorphone (DILAUDID) injection 0.5 mg (0.5 mg Intravenous Given 09/15/22 1246)  iohexol (OMNIPAQUE) 350 MG/ML injection 75 mL (75 mLs Intravenous Contrast Given 09/15/22 1302)  morphine (PF) 2 MG/ML injection 2 mg (2 mg Intravenous Given 09/15/22 1400)  morphine (PF) 4 MG/ML injection 2 mg (2 mg Intravenous Given 09/15/22 1350)    ED Course/ Medical Decision Making/ A&P                             Medical Decision Making Amount and/or Complexity of Data Reviewed Labs: ordered. Radiology: ordered.  Risk OTC drugs. Prescription drug management. Decision regarding hospitalization.  Patient reports mechanical fall landing on a coffee table in the left side of his chest.  On arrival, patient has extensive subcutaneous emphysema and focal chest wall pain consistent with probable pneumothorax or bronchial tear or bleb rupture.  On exam there are  audible breath sounds bilaterally and patient is maintaining oxygen saturation with supplemental oxygen with clear mental status and stable blood pressure.  Portable  chest x-ray reviewed at bedside.  No apparent pneumothorax.  Extensive subcu emphysema.  2 rib fractures on the left lower chest approximately ribs 9/10.  With portable chest x-ray not showing obvious pneumothorax we will proceed with CT scan of the chest.  Portable chest x-ray has extensive subcu emphysema which might obscure a pneumothorax however no evidently large wound present.  Consult: Trauma notified.  Dr. Bedelia Person is in the OR.  Relayed by OR nurse.  Requests  CT chest abdomen pelvis  High suspicion for a bleb rupture or other smaller pneumothorax given extensive subcu air.  12: 50 blood pressure remained stable.  Heart rate 130.  Oxygen saturation 98% on supplemental oxygen.  Patient is speaking in short full sentences.  Extensive subcu emphysema of face upper body and arms.  Chest tube placed see procedure note.  Patient does not appear to have other immediate trauma.  Chest tube in place and oxygen saturations remained stable with improved heart rate.  Medications administered as needed.  At this time stable for admission.        Final Clinical Impression(s) / ED Diagnoses Final diagnoses:  Fall, initial encounter  Closed fracture of multiple ribs of left side, initial encounter  Traumatic pneumothorax, initial encounter    Rx / DC Orders ED Discharge Orders     None         Arby Barrette, MD 09/15/22 1712

## 2022-09-15 NOTE — ED Notes (Signed)
Pt left eye now unable to see and right eye swelling increasing. Since fall patient has been having increased swelling.

## 2022-09-16 ENCOUNTER — Inpatient Hospital Stay (HOSPITAL_COMMUNITY): Payer: Medicare Other

## 2022-09-16 LAB — BASIC METABOLIC PANEL
Anion gap: 10 (ref 5–15)
BUN: <5 mg/dL — ABNORMAL LOW (ref 8–23)
CO2: 26 mmol/L (ref 22–32)
Calcium: 8.1 mg/dL — ABNORMAL LOW (ref 8.9–10.3)
Chloride: 103 mmol/L (ref 98–111)
Creatinine, Ser: 0.53 mg/dL — ABNORMAL LOW (ref 0.61–1.24)
GFR, Estimated: >60 mL/min (ref >60)
Glucose, Bld: 162 mg/dL — ABNORMAL HIGH (ref 70–99)
Potassium: 4 mmol/L (ref 3.5–5.1)
Sodium: 139 mmol/L (ref 135–145)

## 2022-09-16 LAB — CBC
HCT: 43.3 % (ref 39.0–52.0)
Hemoglobin: 14.4 g/dL (ref 13.0–17.0)
MCH: 34.3 pg — ABNORMAL HIGH (ref 26.0–34.0)
MCHC: 33.3 g/dL (ref 30.0–36.0)
MCV: 103.1 fL — ABNORMAL HIGH (ref 80.0–100.0)
Platelets: 253 10*3/uL (ref 150–400)
RBC: 4.2 MIL/uL — ABNORMAL LOW (ref 4.22–5.81)
RDW: 12.7 % (ref 11.5–15.5)
WBC: 15 10*3/uL — ABNORMAL HIGH (ref 4.0–10.5)
nRBC: 0 % (ref 0.0–0.2)

## 2022-09-16 LAB — MRSA NEXT GEN BY PCR, NASAL: MRSA by PCR Next Gen: NOT DETECTED

## 2022-09-16 MED ORDER — ORAL CARE MOUTH RINSE
15.0000 mL | OROMUCOSAL | Status: DC | PRN
  Administered 2022-09-18: 15 mL via OROMUCOSAL

## 2022-09-16 MED ORDER — IPRATROPIUM-ALBUTEROL 0.5-2.5 (3) MG/3ML IN SOLN
3.0000 mL | RESPIRATORY_TRACT | Status: DC
  Administered 2022-09-16 – 2022-09-17 (×4): 3 mL via RESPIRATORY_TRACT
  Filled 2022-09-16 (×4): qty 3

## 2022-09-16 MED ORDER — SPIRITUS FRUMENTI
1.0000 | Freq: Three times a day (TID) | ORAL | Status: DC
  Administered 2022-09-16 – 2022-09-28 (×33): 1 via ORAL
  Filled 2022-09-16 (×22): qty 1

## 2022-09-16 MED ORDER — ORAL CARE MOUTH RINSE
15.0000 mL | OROMUCOSAL | Status: DC
  Administered 2022-09-16 – 2022-09-26 (×34): 15 mL via OROMUCOSAL

## 2022-09-16 NOTE — Progress Notes (Addendum)
Subjective: Felt a little SOB earlier today, but not now.  On 8L O2.  Used to smoke, unaware he likely has COPD til this admit.  Eating well.  No other complaints.  Pulls 750 on IS  ROS: See above, otherwise other systems negative  Objective: Vital signs in last 24 hours: Temp:  [97.8 F (36.6 C)-98.3 F (36.8 C)] 97.8 F (36.6 C) (07/22 2300) Pulse Rate:  [98-130] 98 (07/23 0200) Resp:  [12-28] 17 (07/23 0200) BP: (109-190)/(62-100) 120/74 (07/23 0832) SpO2:  [92 %-100 %] 97 % (07/23 0200) Weight:  [62 kg] 62 kg (07/22 1233) Last BM Date : 09/14/22  Intake/Output from previous day: 07/22 0701 - 07/23 0700 In: -  Out: 910 [Urine:900; Chest Tube:10] Intake/Output this shift: No intake/output data recorded.  PE: Gen: NAD HEENT: subcut air present in neck and some in his face. +crepitus as expected. O2 in place, 8L sating 91-93% Heart: regular Lungs: CTAB, but some decrease breath sounds on left in apical aspect of lung.  Ct in place and re-taped.  8L of O2.  CT with continuous airleak.  Diffuse air and crepitus on both sides of the chest Abd: soft, NT Ext: MAE Psych: A&Ox3  Lab Results:  Recent Labs    09/15/22 1221 09/15/22 1251 09/15/22 2337  WBC 18.6*  --  15.0*  HGB 15.7 16.3 14.4  HCT 45.9 48.0 43.3  PLT 285  --  253   BMET Recent Labs    09/15/22 1853 09/15/22 2337  NA 141 139  K 3.6 4.0  CL 106 103  CO2 18* 26  GLUCOSE 148* 162*  BUN <5* <5*  CREATININE 0.55* 0.53*  CALCIUM 8.5* 8.1*   PT/INR No results for input(s): "LABPROT", "INR" in the last 72 hours. CMP     Component Value Date/Time   NA 139 09/15/2022 2337   K 4.0 09/15/2022 2337   CL 103 09/15/2022 2337   CO2 26 09/15/2022 2337   GLUCOSE 162 (H) 09/15/2022 2337   BUN <5 (L) 09/15/2022 2337   CREATININE 0.53 (L) 09/15/2022 2337   CREATININE 0.66 06/03/2021 0759   CALCIUM 8.1 (L) 09/15/2022 2337   PROT 7.1 09/15/2022 1853   ALBUMIN 3.0 (L) 09/15/2022 1853   AST 44 (H)  09/15/2022 1853   AST 25 06/03/2021 0759   ALT 23 09/15/2022 1853   ALT 11 06/03/2021 0759   ALKPHOS 121 09/15/2022 1853   BILITOT 0.7 09/15/2022 1853   BILITOT 0.8 06/03/2021 0759   GFRNONAA >60 09/15/2022 2337   GFRNONAA >60 06/03/2021 0759   GFRAA >60 09/09/2019 0207   Lipase  No results found for: "LIPASE"     Studies/Results: DG Chest Port 1 View  Result Date: 09/16/2022 CLINICAL DATA:  12219 C6-7 with left chest tube in place, rib fractures. EXAM: PORTABLE CHEST 1 VIEW COMPARISON:  Portable chest yesterday at 2:30 p.m. FINDINGS: 5:29 a.m. Pigtail pleural catheter in the upper left hemithorax has been pulled back such that only the pigtail portion remains in the chest. There is no change in small left apicolateral pneumothorax, extensive pneumomediastinum and extensive chest wall emphysema on both sides. The lungs are generally radiographically clear as well as can be seen, apart from scattered atelectatic changes in the bases. The aorta is tortuous with patchy calcification and stable mediastinal configuration. The cardiac size is normal. Multiple recent left rib fractures are again shown. IMPRESSION: 1. No change in small left apicolateral pneumothorax, extensive pneumomediastinum and chest  wall emphysema. 2. The pigtail pleural catheter in the upper left chest has been pulled back such that only the pigtail portion remains in the chest. 3. No other radiographic changes. Electronically Signed   By: Almira Bar M.D.   On: 09/16/2022 05:48   DG Chest Portable 1 View  Result Date: 09/15/2022 CLINICAL DATA:  Chest tube placement EXAM: PORTABLE CHEST 1 VIEW COMPARISON:  CT 09/15/2022, 01/23/2022 FINDINGS: Interval repositioning of left-sided chest tube, pigtail projects over left apex. Small left apical pneumothorax. Extensive soft tissue emphysema and pneumomediastinum. Acute left lower rib fractures. Normal cardiac size. IMPRESSION: 1. Interval repositioning of left-sided chest tube,  pigtail projects over left apex. Small left apical pneumothorax. 2. Extensive soft tissue emphysema and pneumomediastinum. Electronically Signed   By: Jasmine Pang M.D.   On: 09/15/2022 15:20   DG Chest Portable 1 View  Result Date: 09/15/2022 CLINICAL DATA:  chest tube insertion EXAM: PORTABLE CHEST 1 VIEW COMPARISON:  CT scan chest from earlier the same day. FINDINGS: Status post left-sided pleural drainage catheter placement. The tip of the tube is overlapping the lateral margin of ribcage. Repositioning is recommended. There is small underlying left apical pneumothorax. Extensive surgical emphysema and pneumomediastinum again seen. Acute mildly displaced fracture of the left lower rib fracture is again seen. IMPRESSION: 1. Status post left-sided pleural drainage catheter placement. The tip of the tube is overlapping the lateral margin of rib cage. Repositioning is recommended. 2. Small underlying left apical pneumothorax. 3. Extensive surgical emphysema and pneumomediastinum. Electronically Signed   By: Jules Schick M.D.   On: 09/15/2022 15:01   CT CHEST ABDOMEN PELVIS W CONTRAST  Result Date: 09/15/2022 CLINICAL DATA:  Polytrauma, blunt EXAM: CT CHEST, ABDOMEN, AND PELVIS WITH CONTRAST TECHNIQUE: Multidetector CT imaging of the chest, abdomen and pelvis was performed following the standard protocol during bolus administration of intravenous contrast. RADIATION DOSE REDUCTION: This exam was performed according to the departmental dose-optimization program which includes automated exposure control, adjustment of the mA and/or kV according to patient size and/or use of iterative reconstruction technique. CONTRAST:  75mL OMNIPAQUE IOHEXOL 350 MG/ML SOLN COMPARISON:  CT scan chest, abdomen and pelvis from 06/03/2021. FINDINGS: CT CHEST FINDINGS Cardiovascular: Normal cardiac size. No pericardial effusion. There is shift of mediastinal structures to the right. Extensive pneumomediastinum noted. No aortic  aneurysm. Mediastinum/Nodes: Visualized thyroid gland appears grossly unremarkable. No solid / cystic mediastinal masses. The esophagus is nondistended precluding optimal assessment. No axillary, mediastinal or hilar lymphadenopathy by size criteria. Lungs/Pleura: The central tracheo-bronchial tree is patent. There is small-to-moderate left pneumothorax, approximately 20% in size. There is extensive mediastinal air which also tracks along the mediastinal pleura of the right lung; Otherwise no right pneumothorax. There are patchy areas of linear, plate-like atelectasis and/or scarring throughout bilateral lungs. Trace left pleural effusion noted. No right pleural effusion. Bilateral lungs are otherwise clear. No consolidation or mass. Musculoskeletal: Extensive surgical emphysema noted along the chest with superior and inferior extension into the lower neck and upper abdomen. There are mildly displaced fractures of the left tenth and eleventh ribs along the posterolateral aspect. Several bilateral old healed fractures also seen. No acute vertebral compression fracture deformity. No suspicious osseous lesions. CT ABDOMEN PELVIS FINDINGS Hepatobiliary: The liver is normal in size. Non-cirrhotic configuration. No suspicious mass. These is mild diffuse hepatic steatosis. No intrahepatic or extrahepatic bile duct dilation. Physiologically distended gallbladder. There are dependent gall stones/sludge. Normal gallbladder wall thickness. No pericholecystic inflammatory changes. Pancreas: Unremarkable. No pancreatic ductal dilatation  or surrounding inflammatory changes. Spleen: Within normal limits. No focal lesion. Adrenals/Urinary Tract: Adrenal glands are unremarkable. No suspicious renal mass. There is a 1.1 x 1.1 cm simple cyst in the right kidney lower pole, anteriorly. No hydronephrosis. No renal or ureteric calculi. Unremarkable urinary bladder. Stomach/Bowel: There is a small sliding hiatal hernia. No  disproportionate dilation of the small or large bowel loops. No evidence of abnormal bowel wall thickening or inflammatory changes. The appendix is unremarkable. Vascular/Lymphatic: No ascites or pneumoperitoneum. No abdominal or pelvic lymphadenopathy, by size criteria. No aneurysmal dilation of the major abdominal arteries. There are moderate peripheral atherosclerotic vascular calcifications of the aorta and its major branches. Reproductive: Normal size prostate. Symmetric seminal vesicles. Other: There is surgical emphysema along the abdominal wall extending into the scrotal sac. Musculoskeletal: No suspicious osseous lesions. There is linear undisplaced fracture (series 7, 171 and series 6, image 31) of the left iliac crest along the posterosuperior aspect. IMPRESSION: 1. Acute left tension pneumothorax secondary to mildly displaced left tenth and eleventh rib fractures. There is resultant extensive pneumomediastinum and surgical emphysema extending into the neck superiorly and up to the scrotum inferiorly. 2. There is linear undisplaced fracture of the left iliac crest along the posterosuperior aspect. 3. No other traumatic injury to the chest, abdomen or pelvis. Please see above for additional details. Critical Value/emergent results were called by telephone at the time of interpretation on 09/15/2022 at 1:19 pm to provider [facility] , who verbally acknowledged these results. Electronically Signed   By: Jules Schick M.D.   On: 09/15/2022 13:39   DG Chest Port 1 View  Result Date: 09/15/2022 CLINICAL DATA:  Trauma.  Subcutaneous air EXAM: PORTABLE CHEST 1 VIEW COMPARISON:  01/23/2022 FINDINGS: Extensive chest wall gas identified bilaterally. Hyperinflation. There is presence of left-sided displaced rib fractures. Left-sided pneumothorax identified with visceral to parietal pleura estimated distance of 3.9 cm at the apex. No definite right-sided pneumothorax. No pleural effusion or consolidation.  Normal cardiopericardial silhouette. Significant gas in the mediastinum as well. Tortuous aorta. Critical Value/emergent results were called by telephone at the time of interpretation on 09/15/2022 at 10:07 am to provider [facility] , who verbally acknowledged these results. IMPRESSION: Extensive chest wall gas diffusely with a possible left-sided pneumothorax. Please correlate with follow up CT Left-sided rib fractures. Electronically Signed   By: Karen Kays M.D.   On: 09/15/2022 13:21    Anti-infectives: Anti-infectives (From admission, onward)    None        Assessment/Plan Fall L PTX with pneumomediastinum and extensive subcutaneous emphysema - s/p pigtail chest tube by EDP, small L apical PTX remains on x-ray this morning. Continue CT to -20cm. Repeat CXR in AM. L rib fxs 10-11 - multimodal pain control and pulm toilet Nondisplaced L iliac crest fx - discussed with ortho, WBAT, no need for f/u Hyperkalemia - normal on recheck. EKG normal Alcohol abuse - drinks 8-9 beers daily. CIWA, phenobarb protocol. Social work consult for SBIRT HTN - home med COPD - not on home O2, on 8L here Former smoker - quit 6 years ago   ID - none VTE - SCDs, lovenox FEN - reg diet Foley - none   Dispo - 4np. PT/OT. Chest tube management  I reviewed nursing notes, last 24 h vitals and pain scores, last 48 h intake and output, last 24 h labs and trends, and last 24 h imaging results.   LOS: 1 day    Letha Cape , PA-C  [facility] 09/16/2022, 9:45 AM Please see Amion for pager number during day hours 7:00am-4:30pm or 7:00am -11:30am on weekends

## 2022-09-16 NOTE — Plan of Care (Signed)

## 2022-09-16 NOTE — Evaluation (Signed)
Physical Therapy Evaluation Patient Details Name: Marc Mcdonald MRN: [id number] DOB: [birth year] Today's Date: 09/16/2022  History of Present Illness  Pt is a 67 y.o. M who presents 09/15/2022 after a ground level fall at home. Found to have L PTX with pneumomediastinum and extensive subcutaneous emphysema s/p pigtal chest tube, L rib fxs 10-11, nondisplaced L iliac fx. Pt with significant PMH of COPD, HTN, alcohol abuse.  Clinical Impression  Pt admitted with above. Pt presents with pain and decreased cardiopulmonary endurance. Overall, is mobilizing fairly on PT evaluation. Pt ambulating 60 ft with a walker at a min guard assist level. SpO2 91-92% on 10L O2 via HFNC, HR 99-117 bpm. Suspect steady progress. Will continue to progress mobility as tolerated.      Assistance Recommended at Discharge PRN  If plan is discharge home, recommend the following:  Can travel by private vehicle  Assistance with cooking/housework;Assist for transportation;Help with stairs or ramp for entrance        Equipment Recommendations None recommended by PT  Recommendations for Other Services       Functional Status Assessment Patient has had a recent decline in their functional status and demonstrates the ability to make significant improvements in function in a reasonable and predictable amount of time.     Precautions / Restrictions Precautions Precautions: Fall;Other (comment) Precaution Comments: L rib fxs, CT to suction (per PA, can disconnect to ambulate), HFNC Restrictions Weight Bearing Restrictions: No      Mobility  Bed Mobility Overal bed mobility: Needs Assistance Bed Mobility: Supine to Sit, Sit to Supine     Supine to sit: Supervision Sit to supine: Supervision   General bed mobility comments: Exiting towards left side of bed (towards chest tube), no physical assist required    Transfers Overall transfer level: Needs assistance Equipment used: Rolling walker (2  wheels) Transfers: Sit to/from Stand Sit to Stand: Min guard           General transfer comment: Slow to rise    Ambulation/Gait Ambulation/Gait assistance: Min guard Gait Distance (Feet): 60 Feet Assistive device: Rolling walker (2 wheels) Gait Pattern/deviations: Step-through pattern, Decreased stride length Gait velocity: decreased     General Gait Details: Cues for walker use, slow and steady pace, guarded posture  Stairs            Wheelchair Mobility     Tilt Bed    Modified Rankin (Stroke Patients Only)       Balance Overall balance assessment: Mild deficits observed, not formally tested                                           Pertinent Vitals/Pain Pain Assessment Pain Assessment: Faces Faces Pain Scale: Hurts even more Pain Location: L ribs Pain Descriptors / Indicators: Discomfort, Grimacing, Guarding Pain Intervention(s): Monitored during session, Limited activity within patient's tolerance, Premedicated before session, Repositioned    Home Living Family/patient expects to be discharged to:: Private residence Living Arrangements: Children (oldest son) Available Help at Discharge: Family Type of Home: House Home Access: Stairs to enter   Secretary/administrator of Steps: 2   Home Layout: One level Home Equipment: Agricultural consultant (2 wheels);Cane - single point;Shower seat      Prior Function Prior Level of Function : Independent/Modified Independent             Mobility Comments: reports one other  fall in past year ADLs Comments: does not drive or work, pt son provides transportation     Higher education careers adviser        Extremity/Trunk Assessment   Upper Extremity Assessment Upper Extremity Assessment: Defer to OT evaluation    Lower Extremity Assessment Lower Extremity Assessment: Overall WFL for tasks assessed    Cervical / Trunk Assessment Cervical / Trunk Assessment: Other exceptions Cervical / Trunk  Exceptions: guarded  Communication   Communication: No difficulties  Cognition Arousal/Alertness: Awake/alert Behavior During Therapy: Flat affect Overall Cognitive Status: Within Functional Limits for tasks assessed                                          General Comments      Exercises     Assessment/Plan    PT Assessment Patient needs continued PT services  PT Problem List Decreased strength;Decreased activity tolerance;Decreased balance;Decreased mobility;Pain;Cardiopulmonary status limiting activity       PT Treatment Interventions DME instruction;Gait training;Stair training;Functional mobility training;Therapeutic activities;Therapeutic exercise;Balance training;Patient/family education    PT Goals (Current goals can be found in the Care Plan section)  Acute Rehab PT Goals Patient Stated Goal: less pain PT Goal Formulation: With patient Time For Goal Achievement: 09/30/22 Potential to Achieve Goals: Good    Frequency Min 1X/week     Co-evaluation               AM-PAC PT "6 Clicks" Mobility  Outcome Measure Help needed turning from your back to your side while in a flat bed without using bedrails?: None Help needed moving from lying on your back to sitting on the side of a flat bed without using bedrails?: A Little Help needed moving to and from a bed to a chair (including a wheelchair)?: A Little Help needed standing up from a chair using your arms (e.g., wheelchair or bedside chair)?: A Little Help needed to walk in hospital room?: A Little Help needed climbing 3-5 steps with a railing? : A Little 6 Click Score: 19    End of Session Equipment Utilized During Treatment: Oxygen Activity Tolerance: Patient tolerated treatment well Patient left: in bed;with call bell/phone within reach;Other (comment) (per pt request) Nurse Communication: Mobility status PT Visit Diagnosis: Difficulty in walking, not elsewhere classified  (R26.2);Pain Pain - Right/Left: Left Pain - part of body:  (flank)    Time: 4782-9562 PT Time Calculation (min) (ACUTE ONLY): 22 min   Charges:   PT Evaluation $PT Eval Moderate Complexity: 1 Mod   PT General Charges $$ ACUTE PT VISIT: 1 Visit         Lillia Pauls, PT, DPT Acute Rehabilitation Services Office [phone]   Norval Morton 09/16/2022, 11:18 AM

## 2022-09-16 NOTE — Evaluation (Signed)
Occupational Therapy Evaluation Patient Details Name: Marc Mcdonald MRN: [id number] DOB: 1955/11/26 Today's Date: 09/16/2022   History of Present Illness Pt is a 67 y.o. M who presents 09/15/2022 after a ground level fall at home. Found to have L PTX with pneumomediastinum and extensive subcutaneous emphysema s/p pigtal chest tube, L rib fxs 10-11, nondisplaced L iliac fx. Pt with significant PMH of COPD, HTN, alcohol abuse.   Clinical Impression   PTA patient independent with ADLs, light IADL and mobility.  He was admitted for above and presents with problem list below.  Mainly limited by pain in L side, decreased cardiopulmonary status.  Was on 6L HFNC during session with SPO2 and VSS maintained.  Able to complete ADLs with min guard to setup assist, transfers using RW with min guard.  Educated on safety and recommendations.  Will benefit from further OT services acutely to optimize independence, safety and tolerance for activity but anticipate he will progress well with no further needs required after dc home.      Recommendations for follow up therapy are one component of a multi-disciplinary discharge planning process, led by the attending physician.  Recommendations may be updated based on patient status, additional functional criteria and insurance authorization.   Assistance Recommended at Discharge Intermittent Supervision/Assistance  Patient can return home with the following A little help with walking and/or transfers;A little help with bathing/dressing/bathroom;Assistance with cooking/housework;Help with stairs or ramp for entrance;Assist for transportation    Functional Status Assessment  Patient has had a recent decline in their functional status and demonstrates the ability to make significant improvements in function in a reasonable and predictable amount of time.  Equipment Recommendations  None recommended by OT    Recommendations for Other Services       Precautions /  Restrictions Precautions Precautions: Fall;Other (comment) Precaution Comments: L rib fxs, CT to suction (per PA, can disconnect to ambulate), HFNC Restrictions Weight Bearing Restrictions: No      Mobility Bed Mobility Overal bed mobility: Needs Assistance Bed Mobility: Supine to Sit, Sit to Supine     Supine to sit: Supervision Sit to supine: Supervision   General bed mobility comments: Exiting towards left side of bed (towards chest tube), no physical assist required    Transfers Overall transfer level: Needs assistance Equipment used: Rolling walker (2 wheels) Transfers: Sit to/from Stand Sit to Stand: Min guard           General transfer comment: Slow to rise      Balance Overall balance assessment: Mild deficits observed, not formally tested                                         ADL either performed or assessed with clinical judgement   ADL Overall ADL's : Needs assistance/impaired     Grooming: Set up;Sitting           Upper Body Dressing : Min guard;Sitting   Lower Body Dressing: Minimal assistance;Sit to/from stand   Toilet Transfer: Min guard;Rolling walker (2 wheels)           Functional mobility during ADLs: Min guard;Rolling walker (2 wheels)       Vision   Vision Assessment?: No apparent visual deficits     Perception     Praxis      Pertinent Vitals/Pain Pain Assessment Pain Assessment: Faces Faces Pain Scale: Hurts whole lot Pain  Location: L ribs Pain Descriptors / Indicators: Discomfort, Grimacing, Guarding Pain Intervention(s): Limited activity within patient's tolerance, Monitored during session, Repositioned     Hand Dominance Right   Extremity/Trunk Assessment Upper Extremity Assessment Upper Extremity Assessment: Generalized weakness   Lower Extremity Assessment Lower Extremity Assessment: Defer to PT evaluation   Cervical / Trunk Assessment Cervical / Trunk Assessment: Other  exceptions Cervical / Trunk Exceptions: guarded   Communication Communication Communication: No difficulties   Cognition Arousal/Alertness: Awake/alert Behavior During Therapy: Flat affect Overall Cognitive Status: Within Functional Limits for tasks assessed                                 General Comments: oriented and folllowing commands, not formaly assessed     General Comments  son present and suppportive.  Patient reports dizziness but VSS    Exercises     Shoulder Instructions      Home Living Family/patient expects to be discharged to:: Private residence Living Arrangements: Children Available Help at Discharge: Family Type of Home: House Home Access: Stairs to enter Secretary/administrator of Steps: 2   Home Layout: One level     Bathroom Shower/Tub: Tub/shower unit;Walk-in shower   Bathroom Toilet: Standard     Home Equipment: Agricultural consultant (2 wheels);Cane - single point;BSC/3in1   Additional Comments: son works during the day      Prior Functioning/Environment Prior Level of Function : Independent/Modified Independent             Mobility Comments: reports one other fall in past year ADLs Comments: retired, does not drive; light IADLs, manages meds/fiances        OT Problem List: Decreased activity tolerance;Decreased safety awareness;Decreased knowledge of use of DME or AE;Decreased knowledge of precautions;Cardiopulmonary status limiting activity      OT Treatment/Interventions: Self-care/ADL training;Therapeutic exercise;Energy conservation;DME and/or AE instruction;Therapeutic activities;Cognitive remediation/compensation;Patient/family education;Balance training    OT Goals(Current goals can be found in the care plan section) Acute Rehab OT Goals Patient Stated Goal: get this tube out OT Goal Formulation: With patient Time For Goal Achievement: 09/30/22 Potential to Achieve Goals: Good  OT Frequency: Min 2X/week     Co-evaluation              AM-PAC OT "6 Clicks" Daily Activity     Outcome Measure Help from another person eating meals?: None Help from another person taking care of personal grooming?: A Little Help from another person toileting, which includes using toliet, bedpan, or urinal?: A Little Help from another person bathing (including washing, rinsing, drying)?: A Little Help from another person to put on and taking off regular upper body clothing?: A Little Help from another person to put on and taking off regular lower body clothing?: A Little 6 Click Score: 19   End of Session Equipment Utilized During Treatment: Rolling walker (2 wheels);Oxygen (6L HFNC) Nurse Communication: Mobility status  Activity Tolerance: Patient tolerated treatment well Patient left: in bed;with call bell/phone within reach;with bed alarm set;with nursing/sitter in room;with SCD's reapplied  OT Visit Diagnosis: Other abnormalities of gait and mobility (R26.89);Pain;History of falling (Z91.81) Pain - part of body:  (ribs)                Time: 1241-1310 OT Time Calculation (min): 29 min Charges:  OT General Charges $OT Visit: 1 Visit OT Evaluation $OT Eval Moderate Complexity: 1 Mod  Barry Brunner, OT Acute [employer] [phone]  Chancy Milroy 09/16/2022, 5:23 PM

## 2022-09-17 ENCOUNTER — Inpatient Hospital Stay (HOSPITAL_COMMUNITY): Payer: Medicare Other

## 2022-09-17 LAB — BASIC METABOLIC PANEL
Anion gap: 7 (ref 5–15)
BUN: <5 mg/dL — ABNORMAL LOW (ref 8–23)
CO2: 31 mmol/L (ref 22–32)
Calcium: 8.1 mg/dL — ABNORMAL LOW (ref 8.9–10.3)
Chloride: 94 mmol/L — ABNORMAL LOW (ref 98–111)
Creatinine, Ser: 0.46 mg/dL — ABNORMAL LOW (ref 0.61–1.24)
GFR, Estimated: >60 mL/min (ref >60)
Glucose, Bld: 125 mg/dL — ABNORMAL HIGH (ref 70–99)
Potassium: 3.2 mmol/L — ABNORMAL LOW (ref 3.5–5.1)
Sodium: 132 mmol/L — ABNORMAL LOW (ref 135–145)

## 2022-09-17 LAB — CBC
HCT: 40.9 % (ref 39.0–52.0)
Hemoglobin: 13.8 g/dL (ref 13.0–17.0)
MCH: 34.9 pg — ABNORMAL HIGH (ref 26.0–34.0)
MCHC: 33.7 g/dL (ref 30.0–36.0)
MCV: 103.5 fL — ABNORMAL HIGH (ref 80.0–100.0)
Platelets: 201 10*3/uL (ref 150–400)
RBC: 3.95 MIL/uL — ABNORMAL LOW (ref 4.22–5.81)
RDW: 12.2 % (ref 11.5–15.5)
WBC: 15.7 10*3/uL — ABNORMAL HIGH (ref 4.0–10.5)
nRBC: 0 % (ref 0.0–0.2)

## 2022-09-17 MED ORDER — POTASSIUM CHLORIDE 20 MEQ PO PACK
40.0000 meq | PACK | Freq: Once | ORAL | Status: AC
  Administered 2022-09-17: 40 meq via ORAL
  Filled 2022-09-17: qty 2

## 2022-09-17 NOTE — Progress Notes (Addendum)
Patient ID: Marc Mcdonald, male   DOB: 1955/11/09, 67 y.o.   MRN: [id number]      Subjective: Reports feeling a little better ROS negative except as listed above. Objective: Vital signs in last 24 hours: Temp:  [97.6 F (36.4 C)-98.3 F (36.8 C)] 98.3 F (36.8 C) (07/24 0700) Pulse Rate:  [83-102] 92 (07/24 0800) Resp:  [12-18] 17 (07/24 0800) BP: (106-128)/(68-81) 128/68 (07/24 0800) SpO2:  [92 %-97 %] 94 % (07/24 0800) Last BM Date :  (PTA)  Intake/Output from previous day: 07/23 0701 - 07/24 0700 In: -  Out: 740 [Urine:700; Chest Tube:40] Intake/Output this shift: Total I/O In: -  Out: 4 [Chest Tube:4]  General appearance: alert and cooperative Head: less sub cut air Resp: mostly clear after cough, L chest tube air leak mostly continuous with occasional pause Chest wall: left sided chest wall tenderness GI: benign  Lab Results: CBC  Recent Labs    09/15/22 2337 09/17/22 0154  WBC 15.0* 15.7*  HGB 14.4 13.8  HCT 43.3 40.9  PLT 253 201   BMET Recent Labs    09/15/22 2337 09/17/22 0154  NA 139 132*  K 4.0 3.2*  CL 103 94*  CO2 26 31  GLUCOSE 162* 125*  BUN <5* <5*  CREATININE 0.53* 0.46*  CALCIUM 8.1* 8.1*   PT/INR No results for input(s): "LABPROT", "INR" in the last 72 hours. ABG No results for input(s): "PHART", "HCO3" in the last 72 hours.  Invalid input(s): "PCO2", "PO2"  Studies/Results:   Anti-infectives: Anti-infectives (From admission, onward)    None       Assessment/Plan: Fall L PTX with pneumomediastinum and extensive subcutaneous emphysema - s/p pigtail chest tube by EDP, small L apical PTX remains on x-ray this morning. Continue CT to -20cm. Air leak improving slightly. Repeat CXR in AM. L rib fxs 10-11 - multimodal pain control and pulm toilet Nondisplaced L iliac crest fx - discussed with ortho, WBAT, no need for f/u Hypokalemia - replete Mild hyponatremia - off IVF, F/U AM Alcohol abuse - drinks 8-9 beers daily. CIWA,  beer TID. Social work consult for SBIRT HTN - home med COPD - not on home O2, weaned down to 4L this AM Former smoker - quit 6 years ago   ID - none VTE - SCDs, lovenox FEN - reg diet Foley - none   Dispo - 4np. PT/OT. Chest tube management    LOS: 2 days    Violeta Gelinas, MD, MPH, FACS Trauma & General Surgery Use [email or web address] to contact on call provider  09/17/2022

## 2022-09-17 NOTE — Progress Notes (Signed)
Physical Therapy Treatment Patient Details Name: Marc Mcdonald MRN: [id number] DOB: 10-06-1955 Today's Date: 09/17/2022   History of Present Illness Pt is a 67 y.o. M who presents 09/15/2022 after a ground level fall at home. Found to have L PTX with pneumomediastinum and extensive subcutaneous emphysema s/p pigtal chest tube, L rib fxs 10-11, nondisplaced L iliac fx. Pt with significant PMH of COPD, HTN, alcohol abuse.    PT Comments  Pt progressing steadily towards his physical therapy goals; reports compliance with IS use. Pt ambulating 150 ft with a walker at a supervision level. Titrated to 6L O2 to maintain sats at 90% during mobility; HR up to 128 bpm. Will continue to progress mobility as tolerated.     Assistance Recommended at Discharge PRN  If plan is discharge home, recommend the following:  Can travel by private vehicle    Assistance with cooking/housework;Assist for transportation;Help with stairs or ramp for entrance      Equipment Recommendations  None recommended by PT    Recommendations for Other Services       Precautions / Restrictions Precautions Precautions: Fall;Other (comment) Precaution Comments: L rib fxs, CT to suction (per PA, can disconnect to ambulate), HFNC Restrictions Weight Bearing Restrictions: No     Mobility  Bed Mobility Overal bed mobility: Needs Assistance Bed Mobility: Supine to Sit, Sit to Supine     Supine to sit: Supervision Sit to supine: Supervision   General bed mobility comments: Exiting towards left side of bed (towards chest tube), no physical assist required    Transfers Overall transfer level: Needs assistance Equipment used: Rolling walker (2 wheels) Transfers: Sit to/from Stand Sit to Stand: Supervision                Ambulation/Gait Ambulation/Gait assistance: Supervision Gait Distance (Feet): 160 Feet Assistive device: Rolling walker (2 wheels) Gait Pattern/deviations: Step-through pattern, Decreased  stride length Gait velocity: decreased     General Gait Details: Increased left foot external rotation, supervision for safety, no gross unsteadiness noted. Min cues for activity pacing   Stairs             Wheelchair Mobility     Tilt Bed    Modified Rankin (Stroke Patients Only)       Balance Overall balance assessment: Mild deficits observed, not formally tested                                          Cognition Arousal/Alertness: Awake/alert Behavior During Therapy: Flat affect Overall Cognitive Status: Within Functional Limits for tasks assessed                                          Exercises      General Comments        Pertinent Vitals/Pain Pain Assessment Pain Assessment: Faces Faces Pain Scale: Hurts even more Pain Location: L ribs Pain Descriptors / Indicators: Discomfort, Grimacing, Guarding Pain Intervention(s): Monitored during session    Home Living                          Prior Function            PT Goals (current goals can now be found in the care plan section) Acute  Rehab PT Goals Patient Stated Goal: less pain PT Goal Formulation: With patient Time For Goal Achievement: 09/30/22 Potential to Achieve Goals: Good Progress towards PT goals: Progressing toward goals    Frequency    Min 1X/week      PT Plan Current plan remains appropriate    Co-evaluation              AM-PAC PT "6 Clicks" Mobility   Outcome Measure  Help needed turning from your back to your side while in a flat bed without using bedrails?: None Help needed moving from lying on your back to sitting on the side of a flat bed without using bedrails?: A Little Help needed moving to and from a bed to a chair (including a wheelchair)?: A Little Help needed standing up from a chair using your arms (e.g., wheelchair or bedside chair)?: A Little Help needed to walk in hospital room?: A Little Help needed  climbing 3-5 steps with a railing? : A Little 6 Click Score: 19    End of Session Equipment Utilized During Treatment: Oxygen Activity Tolerance: Patient tolerated treatment well Patient left: in bed;with call bell/phone within reach;Other (comment) (per pt request) Nurse Communication: Mobility status PT Visit Diagnosis: Difficulty in walking, not elsewhere classified (R26.2);Pain Pain - Right/Left: Left Pain - part of body:  (flank)     Time: 1610-9604 PT Time Calculation (min) (ACUTE ONLY): 14 min  Charges:    $Therapeutic Activity: 8-22 mins PT General Charges $$ ACUTE PT VISIT: 1 Visit                     Lillia Pauls, PT, DPT Acute Rehabilitation Services Office [phone]    Marc Mcdonald 09/17/2022, 2:55 PM

## 2022-09-18 ENCOUNTER — Inpatient Hospital Stay (HOSPITAL_COMMUNITY): Payer: Medicare Other

## 2022-09-18 LAB — BASIC METABOLIC PANEL
Anion gap: 7 (ref 5–15)
BUN: <5 mg/dL — ABNORMAL LOW (ref 8–23)
CO2: 31 mmol/L (ref 22–32)
Calcium: 8.1 mg/dL — ABNORMAL LOW (ref 8.9–10.3)
Creatinine, Ser: 0.43 mg/dL — ABNORMAL LOW (ref 0.61–1.24)
GFR, Estimated: >60 mL/min (ref >60)
Sodium: 131 mmol/L — ABNORMAL LOW (ref 135–145)

## 2022-09-18 NOTE — Progress Notes (Signed)
Patient ID: Marc Mcdonald, male   DOB: 1955/07/03, 67 y.o.   MRN: [id number]      Subjective: Up in chair ROS negative except as listed above. Objective: Vital signs in last 24 hours: Temp:  [97.9 F (36.6 C)-98.5 F (36.9 C)] 97.9 F (36.6 C) (07/25 0737) Pulse Rate:  [92-104] 92 (07/25 0800) Resp:  [14-20] 14 (07/25 0800) BP: (110-137)/(68-80) 111/70 (07/25 0800) SpO2:  [91 %-95 %] 93 % (07/25 0800) Last BM Date : 09/15/22 (Per pt)  Intake/Output from previous day: 07/24 0701 - 07/25 0700 In: -  Out: 1034 [Urine:1000; Chest Tube:34] Intake/Output this shift: No intake/output data recorded.  General appearance: alert and cooperative Head: less sub cut air Chest wall: sub cut air, L chest tube intermittent air leak GI: soft, NT Extremities: no sig edema Up in chair  Lab Results: CBC  Recent Labs    09/15/22 2337 09/17/22 0154  WBC 15.0* 15.7*  HGB 14.4 13.8  HCT 43.3 40.9  PLT 253 201   BMET Recent Labs    09/17/22 0154 09/18/22 0027  NA 132* 131*  K 3.2* 3.5  CL 94* 93*  CO2 31 31  GLUCOSE 125* 105*  BUN <5* <5*  CREATININE 0.46* 0.43*  CALCIUM 8.1* 8.1*   PT/INR No results for input(s): "LABPROT", "INR" in the last 72 hours. ABG No results for input(s): "PHART", "HCO3" in the last 72 hours.  Invalid input(s): "PCO2", "PO2"  Studies/Results: DG CHEST PORT 1 VIEW  Result Date: 09/17/2022 CLINICAL DATA:  [id number] Pneumothorax, left [id number] EXAM: PORTABLE CHEST 1 VIEW COMPARISON:  09/16/2022. FINDINGS: Redemonstration of small left apicolateral pneumothorax. No significant interval change. Stable positioning of the left-sided pleural drainage catheter. Redemonstration of extensive surgical emphysema, limiting evaluation of bilateral lungs. Having said that, bilateral lungs are grossly clear and similar to the prior study. No large pleural effusion. The pneumomediastinum is also unchanged. Otherwise stable cardiomediastinal silhouette. Redemonstration of  right lower lateral rib fractures. The soft tissues are within normal limits. IMPRESSION: 1. Essentially stable exam. 2. Stable small left apicolateral pneumothorax. Stable positioning of the left-sided pleural drainage catheter. 3. Stable pneumomediastinum and extensive surgical emphysema. Electronically Signed   By: Jules Schick M.D.   On: 09/17/2022 08:26    Anti-infectives: Anti-infectives (From admission, onward)    None       Assessment/Plan: Fall L PTX with pneumomediastinum and extensive subcutaneous emphysema - s/p pigtail chest tube by EDP, CXR looks stable. Continue CT to -20cm. Air leak now intermittent. Repeat CXR in AM. L rib fxs 10-11 - multimodal pain control and pulm toilet Nondisplaced L iliac crest fx - discussed with ortho, WBAT, no need for f/u Hypokalemia - replete Mild hyponatremia - off IVF, F/U AM Alcohol abuse - drinks 8-9 beers daily. CIWA, beer TID. Social work consult for SBIRT HTN - home med COPD - not on home O2, weaned down to 2L this AM Former smoker - quit 6 years ago   ID - none VTE - SCDs, lovenox FEN - reg diet Foley - none   Dispo - 4np. PT/OT. Chest tube management    LOS: 3 days    Violeta Gelinas, MD, MPH, FACS Trauma & General Surgery Use [email or web address] to contact on call provider  09/18/2022

## 2022-09-18 NOTE — Progress Notes (Signed)
Occupational Therapy Treatment Patient Details Name: Marc Mcdonald MRN: [id number] DOB: 07-Jan-1956 Today's Date: 09/18/2022   History of present illness Pt is a 67 y.o. M who presents 09/15/2022 after a ground level fall at home. Found to have L PTX with pneumomediastinum and extensive subcutaneous emphysema s/p pigtal chest tube, L rib fxs 10-11, nondisplaced L iliac fx. Pt with significant PMH of COPD, HTN, alcohol abuse.   OT comments  Pt. Seen for skilled OT treatment session.  With some encouragement agreeable to oob.  Bed mobility S with no physical assistance required.  LB dressing S seated.  Simulated toileting task min guard A with some cues for hand placement during transition into sitting.  Progressing well.  Agree with d/c recommendations.  Cont. With current acute OT poc while pt. Here.     Recommendations for follow up therapy are one component of a multi-disciplinary discharge planning process, led by the attending physician.  Recommendations may be updated based on patient status, additional functional criteria and insurance authorization.    Assistance Recommended at Discharge Intermittent Supervision/Assistance  Patient can return home with the following  A little help with walking and/or transfers;A little help with bathing/dressing/bathroom;Assistance with cooking/housework;Help with stairs or ramp for entrance;Assist for transportation   Equipment Recommendations  None recommended by OT    Recommendations for Other Services      Precautions / Restrictions Precautions Precautions: Fall;Other (comment) Precaution Comments: L rib fxs, CT to suction (per PA, can disconnect to ambulate) HOH requests "the louder the better"       Mobility Bed Mobility Overal bed mobility: Needs Assistance Bed Mobility: Supine to Sit     Supine to sit: Supervision     General bed mobility comments: Exiting towards left side of bed (towards chest tube), no physical assist required     Transfers Overall transfer level: Needs assistance Equipment used: Rolling walker (2 wheels) Transfers: Sit to/from Stand, Bed to chair/wheelchair/BSC Sit to Stand: Supervision           General transfer comment: rw used, transfer to L from eob approx 3 steps to recliner, cues for hand placement prior to sitting down     Balance                                           ADL either performed or assessed with clinical judgement   ADL Overall ADL's : Needs assistance/impaired                     Lower Body Dressing: Sitting/lateral leans Lower Body Dressing Details (indicate cue type and reason): able to figure 4 for adjusting socks prior to transfer Toilet Transfer: Min guard;Rolling walker (2 wheels);Stand-pivot;Cueing for sequencing Toilet Transfer Details (indicate cue type and reason): eob with approx. 3 steps to recliner         Functional mobility during ADLs: Min guard;Rolling walker (2 wheels);Cueing for sequencing      Extremity/Trunk Assessment              Vision       Perception     Praxis      Cognition Arousal/Alertness: Awake/alert Behavior During Therapy: WFL for tasks assessed/performed Overall Cognitive Status: Within Functional Limits for tasks assessed  General Comments: oriented and folllowing commands, states he is HOH "the louder the better"        Exercises      Shoulder Instructions       General Comments      Pertinent Vitals/ Pain       Pain Assessment Pain Assessment: Faces Faces Pain Scale: Hurts little more Pain Location: L ribs Pain Descriptors / Indicators: Discomfort, Grimacing, Guarding Pain Intervention(s): Limited activity within patient's tolerance, Repositioned, Monitored during session  Home Living                                          Prior Functioning/Environment              Frequency  Min 2X/week         Progress Toward Goals  OT Goals(current goals can now be found in the care plan section)  Progress towards OT goals: Progressing toward goals     Plan Discharge plan remains appropriate    Co-evaluation                 AM-PAC OT "6 Clicks" Daily Activity     Outcome Measure   Help from another person eating meals?: None Help from another person taking care of personal grooming?: A Little Help from another person toileting, which includes using toliet, bedpan, or urinal?: A Little Help from another person bathing (including washing, rinsing, drying)?: A Little Help from another person to put on and taking off regular upper body clothing?: A Little Help from another person to put on and taking off regular lower body clothing?: A Little 6 Click Score: 19    End of Session Equipment Utilized During Treatment: Rolling walker (2 wheels);Oxygen  OT Visit Diagnosis: Other abnormalities of gait and mobility (R26.89);Pain;History of falling (Z91.81)   Activity Tolerance Patient tolerated treatment well   Patient Left in chair;with call bell/phone within reach;with chair alarm set   Nurse Communication          Time: [phone] OT Time Calculation (min): 21 min  Charges: OT General Charges $OT Visit: 1 Visit OT Treatments $Self Care/Home Management : 8-22 mins  Boneta Lucks, COTA/L Acute Rehabilitation [phone]   Alessandra Bevels Lorraine-COTA/L 09/18/2022, 12:54 PM

## 2022-09-18 NOTE — Care Management Important Message (Signed)
Important Message  Patient Details  Name: Marc Mcdonald MRN: [id number] Date of Birth: [birth year]   Medicare Important Message Given:  Yes     Sherilyn Banker 09/18/2022, 1:42 PM

## 2022-09-18 NOTE — Plan of Care (Signed)

## 2022-09-19 ENCOUNTER — Inpatient Hospital Stay (HOSPITAL_COMMUNITY): Payer: Medicare Other

## 2022-09-19 NOTE — Progress Notes (Signed)
Physical Therapy Treatment Patient Details Name: Marc Mcdonald MRN: [id number] DOB: 12/11/1955 Today's Date: 09/19/2022   History of Present Illness Pt is a 67 y.o. M who presents 09/15/2022 after a ground level fall at home. Found to have L PTX with pneumomediastinum and extensive subcutaneous emphysema s/p pigtal chest tube, L rib fxs 10-11, nondisplaced L iliac fx. Pt with significant PMH of COPD, HTN, alcohol abuse.    PT Comments  Pt progressing well. Able to maintain SpO2 in 90s on 2L, as compared to 6L in previous PT session.  Supervision bed mobility, supervision transfers, and supervision amb 200' with RW. SpO2 94% at rest on 2L and 90% during amb. Pt declining sitting in recliner due to reports that it is uncomfortable. Returned to bed at end of session with highly elevated HOB. CT returned to suction. Current POC remains appropriate.    Assistance Recommended at Discharge PRN  If plan is discharge home, recommend the following:  Can travel by private vehicle    Assistance with cooking/housework;Assist for transportation;Help with stairs or ramp for entrance      Equipment Recommendations  None recommended by PT    Recommendations for Other Services       Precautions / Restrictions Precautions Precautions: Fall;Other (comment) Precaution Comments: L rib fxs, CT to suction (per PA, can disconnect to amb), watch sats     Mobility  Bed Mobility Overal bed mobility: Needs Assistance Bed Mobility: Supine to Sit, Sit to Supine     Supine to sit: Supervision Sit to supine: Supervision   General bed mobility comments: supervision for safety/lines    Transfers Overall transfer level: Needs assistance Equipment used: Rolling walker (2 wheels) Transfers: Sit to/from Stand Sit to Stand: Supervision           General transfer comment: supervision for safety/lines    Ambulation/Gait Ambulation/Gait assistance: Supervision Gait Distance (Feet): 200 Feet Assistive  device: Rolling walker (2 wheels) Gait Pattern/deviations: Step-through pattern, Decreased stride length Gait velocity: decreased Gait velocity interpretation: 1.31 - 2.62 ft/sec, indicative of limited community ambulator   General Gait Details: Amb on 2L with SpO2 90-94%. Max HR 128   Stairs             Wheelchair Mobility     Tilt Bed    Modified Rankin (Stroke Patients Only)       Balance Overall balance assessment: Mild deficits observed, not formally tested                                          Cognition Arousal/Alertness: Awake/alert Behavior During Therapy: WFL for tasks assessed/performed Overall Cognitive Status: Within Functional Limits for tasks assessed                                          Exercises      General Comments General comments (skin integrity, edema, etc.): SpO2 94% at rest on 2L, with decrease to 90% during amb on 2L. Max HR 128      Pertinent Vitals/Pain Pain Assessment Pain Assessment: Faces Faces Pain Scale: Hurts a little bit Pain Location: L ribs Pain Descriptors / Indicators: Discomfort, Grimacing, Guarding Pain Intervention(s): Monitored during session, Repositioned    Home Living  Prior Function            PT Goals (current goals can now be found in the care plan section) Acute Rehab PT Goals Patient Stated Goal: home Progress towards PT goals: Progressing toward goals    Frequency    Min 1X/week      PT Plan Current plan remains appropriate    Co-evaluation              AM-PAC PT "6 Clicks" Mobility   Outcome Measure  Help needed turning from your back to your side while in a flat bed without using bedrails?: None Help needed moving from lying on your back to sitting on the side of a flat bed without using bedrails?: A Little Help needed moving to and from a bed to a chair (including a wheelchair)?: A Little Help needed  standing up from a chair using your arms (e.g., wheelchair or bedside chair)?: A Little Help needed to walk in hospital room?: A Little Help needed climbing 3-5 steps with a railing? : A Little 6 Click Score: 19    End of Session Equipment Utilized During Treatment: Oxygen;Gait belt Activity Tolerance: Patient tolerated treatment well Patient left: in bed;with call bell/phone within reach;with bed alarm set Nurse Communication: Mobility status PT Visit Diagnosis: Difficulty in walking, not elsewhere classified (R26.2);Pain Pain - Right/Left: Left     Time: 6962-9528 PT Time Calculation (min) (ACUTE ONLY): 24 min  Charges:    $Gait Training: 23-37 mins PT General Charges $$ ACUTE PT VISIT: 1 Visit                     Ferd Glassing., PT  Office # [phone]    Ilda Foil 09/19/2022, 9:31 AM

## 2022-09-19 NOTE — Progress Notes (Signed)
Patient ID: Marc Mcdonald, male   DOB: 23-Aug-1955, 67 y.o.   MRN: [id number]      Subjective: No new complaints ROS negative except as listed above. Objective: Vital signs in last 24 hours: Temp:  [97.4 F (36.3 C)-98.5 F (36.9 C)] 97.7 F (36.5 C) (07/26 0728) Pulse Rate:  [96-109] 102 (07/26 0728) Resp:  [15-20] 17 (07/26 0728) BP: (114-129)/(71-85) 129/84 (07/26 0728) SpO2:  [92 %-95 %] 95 % (07/26 0728) Last BM Date : 09/15/22 (PTA)  Intake/Output from previous day: 07/25 0701 - 07/26 0700 In: -  Out: 1303 [Urine:1300; Chest Tube:3] Intake/Output this shift: No intake/output data recorded.  General appearance: cooperative Resp: clear to auscultation bilaterally Chest wall: sub cut air, chest tube with intermittent air leak GI: soft, NT Extremities: sub cut air decreasing R arm  Lab Results: CBC  Recent Labs    09/17/22 0154  WBC 15.7*  HGB 13.8  HCT 40.9  PLT 201   BMET Recent Labs    09/17/22 0154 09/18/22 0027  NA 132* 131*  K 3.2* 3.5  CL 94* 93*  CO2 31 31  GLUCOSE 125* 105*  BUN <5* <5*  CREATININE 0.46* 0.43*  CALCIUM 8.1* 8.1*   PT/INR No results for input(s): "LABPROT", "INR" in the last 72 hours. ABG No results for input(s): "PHART", "HCO3" in the last 72 hours.  Invalid input(s): "PCO2", "PO2"  Studies/Results: DG CHEST PORT 1 VIEW  Result Date: 09/19/2022 CLINICAL DATA:  Pneumothorax left-sided chest tube. EXAM: PORTABLE CHEST 1 VIEW COMPARISON:  One-view chest x-ray 09/18/2022 FINDINGS: A left-sided chest tube is stable in position. Small left apical pneumothorax is stable. Linear atelectasis is present at both lung bases. Subcutaneous emphysema is present bilaterally. No significant right-sided pneumothorax is present. IMPRESSION: 1. Stable left-sided chest tube. 2. Stable small left apical pneumothorax. 3. Bibasilar atelectasis. Electronically Signed   By: Marin Roberts M.D.   On: 09/19/2022 09:28   DG CHEST PORT 1  VIEW  Result Date: 09/18/2022 CLINICAL DATA:  Left pneumothorax EXAM: PORTABLE CHEST 1 VIEW COMPARISON:  Previous studies including the examination done on 09/17/2022 FINDINGS: Cardiac size is within normal limits. Thoracic aorta is tortuous and ectatic. There are linear densities in the lower lung fields suggesting subsegmental atelectasis. There is small left apical pneumothorax with possible minimal increase. Left chest tube is seen with its tip in the left upper lung field. There is interval decrease in pneumomediastinum. No significant changes are noted in subcutaneous emphysema. IMPRESSION: Small left apical pneumothorax with possible minimal increase. Linear densities in the lower lung fields suggest subsegmental atelectasis. Electronically Signed   By: Ernie Avena M.D.   On: 09/18/2022 10:16    Anti-infectives: Anti-infectives (From admission, onward)    None       Assessment/Plan: Fall L PTX with pneumomediastinum and extensive subcutaneous emphysema - s/p pigtail chest tube by EDP, CXR looks stable with small PTX. Continue CT to -20cm. Air leak now intermittent. Repeat CXR in AM. L rib fxs 10-11 - multimodal pain control and pulm toilet Nondisplaced L iliac crest fx - discussed with ortho, WBAT, no need for f/u Hypokalemia - replete Mild hyponatremia - off IVF, F/U AM Alcohol abuse - drinks 8-9 beers daily. CIWA, beer TID. Social work consult for SBIRT HTN - home med COPD - not on home O2, weaned down to 2L this AM Former smoker - quit 6 years ago   ID - none VTE - SCDs, lovenox FEN - reg diet Foley -  none   Dispo - 4np. PT/OT. Chest tube management    LOS: 4 days    Violeta Gelinas, MD, MPH, FACS Trauma & General Surgery Use [email or web address] to contact on call provider  09/19/2022

## 2022-09-19 NOTE — Plan of Care (Signed)

## 2022-09-19 NOTE — Plan of Care (Signed)

## 2022-09-20 ENCOUNTER — Inpatient Hospital Stay (HOSPITAL_COMMUNITY): Payer: Medicare Other

## 2022-09-20 NOTE — Progress Notes (Signed)
Patient ID: Marc Mcdonald, male   DOB: [birth year], 67 y.o.   MRN: [id number]      Subjective: No new complaints, eager to go home.  ROS negative except as listed above. Objective: Vital signs in last 24 hours: Temp:  [97.6 F (36.4 C)-98.5 F (36.9 C)] 97.8 F (36.6 C) (07/27 0829) Pulse Rate:  [92-107] 99 (07/27 0309) Resp:  [17-19] 19 (07/27 0309) BP: (119-143)/(75-85) 130/79 (07/27 0829) SpO2:  [91 %-95 %] 94 % (07/27 0309) Last BM Date : 09/15/22  Intake/Output from previous day: 07/26 0701 - 07/27 0700 In: -  Out: 1976 [Urine:1975; Chest Tube:1] Intake/Output this shift: No intake/output data recorded.  General appearance: cooperative Resp: clear to auscultation bilaterally Chest wall: sub cut air, chest tube with intermittent air leak GI: soft, NT Extremities: sub cut air decreasing R arm  Lab Results: CBC  No results for input(s): "WBC", "HGB", "HCT", "PLT" in the last 72 hours.  BMET Recent Labs    09/18/22 0027  NA 131*  K 3.5  CL 93*  CO2 31  GLUCOSE 105*  BUN <5*  CREATININE 0.43*  CALCIUM 8.1*   PT/INR No results for input(s): "LABPROT", "INR" in the last 72 hours. ABG No results for input(s): "PHART", "HCO3" in the last 72 hours.  Invalid input(s): "PCO2", "PO2"  Studies/Results: DG CHEST PORT 1 VIEW  Result Date: 09/20/2022 CLINICAL DATA:  67 year old male with history of left-sided pneumothorax. EXAM: PORTABLE CHEST 1 VIEW COMPARISON:  Chest x-ray 09/19/2022. FINDINGS: Small bore left-sided chest tube with pigtail partially formed in the upper left hemithorax. Slight decreased size of small left apical pneumothorax. No definite right-sided pneumothorax. Linear opacities in the lung bases bilaterally, similar to the prior study, most compatible with areas of subsegmental atelectasis and/or scarring. No definite consolidative airspace disease. No definite pleural effusions. No evidence of pulmonary edema. Heart size is normal. The patient is  rotated to the right on today's exam, resulting in distortion of the mediastinal contours and reduced diagnostic sensitivity and specificity for mediastinal pathology. Extensive subcutaneous emphysema throughout the chest wall and cervical regions bilaterally, similar to the prior study. Mildly displaced posterolateral left tenth rib fracture again noted. IMPRESSION: 1. Left-sided chest tube is stable in position with slight decreased size of small left-sided pneumothorax. 2. Bibasilar areas of subsegmental atelectasis and/or scarring, similar to prior examinations. 3. Persistent extensive subcutaneous emphysema redemonstrated. Electronically Signed   By: Trudie Reed M.D.   On: 09/20/2022 09:20   DG CHEST PORT 1 VIEW  Result Date: 09/19/2022 CLINICAL DATA:  Pneumothorax left-sided chest tube. EXAM: PORTABLE CHEST 1 VIEW COMPARISON:  One-view chest x-ray 09/18/2022 FINDINGS: A left-sided chest tube is stable in position. Small left apical pneumothorax is stable. Linear atelectasis is present at both lung bases. Subcutaneous emphysema is present bilaterally. No significant right-sided pneumothorax is present. IMPRESSION: 1. Stable left-sided chest tube. 2. Stable small left apical pneumothorax. 3. Bibasilar atelectasis. Electronically Signed   By: Marin Roberts M.D.   On: 09/19/2022 09:28    Anti-infectives: Anti-infectives (From admission, onward)    None       Assessment/Plan: Fall L PTX with pneumomediastinum and extensive subcutaneous emphysema - s/p pigtail chest tube by EDP, CXR looks stable with small PTX. Continue CT to -20cm. Air leak now intermittent.  L rib fxs 10-11 - multimodal pain control and pulm toilet Nondisplaced L iliac crest fx - discussed with ortho, WBAT, no need for f/u Hypokalemia - replete Mild hyponatremia - off IVF,  F/U AM Alcohol abuse - drinks 8-9 beers daily. CIWA, beer TID. Social work consult for SBIRT HTN - home med COPD - not on home O2, weaned down  to 2L this AM Former smoker - quit 6 years ago   ID - none VTE - SCDs, lovenox FEN - reg diet Foley - none   Dispo - 4np. PT/OT. Chest tube management    LOS: 5 days    Letha Cape, PA-C  Trauma & General Surgery Use [email or web address] to contact on call provider  09/20/2022

## 2022-09-20 NOTE — Plan of Care (Signed)

## 2022-09-20 NOTE — Plan of Care (Signed)

## 2022-09-21 ENCOUNTER — Inpatient Hospital Stay (HOSPITAL_COMMUNITY): Payer: Medicare Other

## 2022-09-21 LAB — BASIC METABOLIC PANEL WITH GFR
Anion gap: 9 (ref 5–15)
BUN: <5 mg/dL — ABNORMAL LOW (ref 8–23)
CO2: 30 mmol/L (ref 22–32)
Calcium: 8.4 mg/dL — ABNORMAL LOW (ref 8.9–10.3)
Chloride: 93 mmol/L — ABNORMAL LOW (ref 98–111)
Creatinine, Ser: 0.52 mg/dL — ABNORMAL LOW (ref 0.61–1.24)
GFR, Estimated: >60 mL/min (ref >60)
Glucose, Bld: 99 mg/dL (ref 70–99)
Potassium: 3.2 mmol/L — ABNORMAL LOW (ref 3.5–5.1)
Sodium: 132 mmol/L — ABNORMAL LOW (ref 135–145)

## 2022-09-21 MED ORDER — POTASSIUM CHLORIDE CRYS ER 20 MEQ PO TBCR
40.0000 meq | EXTENDED_RELEASE_TABLET | Freq: Two times a day (BID) | ORAL | Status: AC
  Administered 2022-09-21 (×2): 40 meq via ORAL
  Filled 2022-09-21 (×2): qty 2

## 2022-09-21 NOTE — Progress Notes (Signed)
Patient ID: Marc Mcdonald, male   DOB: 06-07-1955, 67 y.o.   MRN: [id number]      Subjective: No new complaints.  ROS negative except as listed above. Objective: Vital signs in last 24 hours: Temp:  [97.1 F (36.2 C)-98.7 F (37.1 C)] 98.4 F (36.9 C) (07/28 0700) Pulse Rate:  [99-104] 104 (07/28 0252) Resp:  [19-20] 20 (07/28 0252) BP: (133-147)/(78-93) 138/93 (07/28 0700) SpO2:  [91 %-93 %] 93 % (07/28 0252) Last BM Date : 09/15/22  Intake/Output from previous day: 07/27 0701 - 07/28 0700 In: -  Out: 810 [Urine:800; Chest Tube:10] Intake/Output this shift: No intake/output data recorded.  General appearance: cooperative Resp: clear to auscultation bilaterally Chest wall: sub cut air, chest tube with pretty continuous air leak again today. GI: soft, NT Extremities: MAE Psych: A&Ox3  Lab Results: CBC  No results for input(s): "WBC", "HGB", "HCT", "PLT" in the last 72 hours.  BMET Recent Labs    09/21/22 0415  NA 132*  K 3.2*  CL 93*  CO2 30  GLUCOSE 99  BUN <5*  CREATININE 0.52*  CALCIUM 8.4*   PT/INR No results for input(s): "LABPROT", "INR" in the last 72 hours. ABG No results for input(s): "PHART", "HCO3" in the last 72 hours.  Invalid input(s): "PCO2", "PO2"  Studies/Results: DG CHEST PORT 1 VIEW  Result Date: 09/20/2022 CLINICAL DATA:  67 year old male with history of left-sided pneumothorax. EXAM: PORTABLE CHEST 1 VIEW COMPARISON:  Chest x-ray 09/19/2022. FINDINGS: Small bore left-sided chest tube with pigtail partially formed in the upper left hemithorax. Slight decreased size of small left apical pneumothorax. No definite right-sided pneumothorax. Linear opacities in the lung bases bilaterally, similar to the prior study, most compatible with areas of subsegmental atelectasis and/or scarring. No definite consolidative airspace disease. No definite pleural effusions. No evidence of pulmonary edema. Heart size is normal. The patient is rotated to the  right on today's exam, resulting in distortion of the mediastinal contours and reduced diagnostic sensitivity and specificity for mediastinal pathology. Extensive subcutaneous emphysema throughout the chest wall and cervical regions bilaterally, similar to the prior study. Mildly displaced posterolateral left tenth rib fracture again noted. IMPRESSION: 1. Left-sided chest tube is stable in position with slight decreased size of small left-sided pneumothorax. 2. Bibasilar areas of subsegmental atelectasis and/or scarring, similar to prior examinations. 3. Persistent extensive subcutaneous emphysema redemonstrated. Electronically Signed   By: Trudie Reed M.D.   On: 09/20/2022 09:20    Anti-infectives: Anti-infectives (From admission, onward)    None       Assessment/Plan: Fall L PTX with pneumomediastinum and extensive subcutaneous emphysema - s/p pigtail chest tube by EDP, CXR looks stable with small PTX. Continue CT to -20cm. Air leak now intermittent to a bit more continuous again today.  Repeat CXR in am.  Will discuss with MD if there are any needs for a second chest tube or TCTS consult at some point given persistent air leak. L rib fxs 10-11 - multimodal pain control and pulm toilet Nondisplaced L iliac crest fx - discussed with ortho, WBAT, no need for f/u Hypokalemia - replete, K 3.2.  check in am, add mag Mild hyponatremia - off IVF, 132 Alcohol abuse - drinks 8-9 beers daily. CIWA, beer TID. Social work consult for SBIRT AM HTN - home med COPD - not on home O2, weaned down to 2L this AM Former smoker - quit 6 years ago   ID - none VTE - SCDs, lovenox FEN - reg  diet Foley - none   Dispo - 4np. PT/OT. Chest tube management    LOS: 6 days    Letha Cape, PA-C  Trauma & General Surgery Use [email or web address] to contact on call provider  09/21/2022

## 2022-09-22 ENCOUNTER — Inpatient Hospital Stay (HOSPITAL_COMMUNITY): Payer: Medicare Other

## 2022-09-22 MED ORDER — MAGNESIUM OXIDE -MG SUPPLEMENT 400 (240 MG) MG PO TABS
400.0000 mg | ORAL_TABLET | Freq: Two times a day (BID) | ORAL | Status: AC
  Administered 2022-09-22 (×2): 400 mg via ORAL
  Filled 2022-09-22 (×2): qty 1

## 2022-09-22 MED ORDER — LIDOCAINE HCL (PF) 1 % IJ SOLN
INTRAMUSCULAR | Status: AC
  Filled 2022-09-22: qty 30

## 2022-09-22 NOTE — Progress Notes (Signed)
Patient ID: Marc Mcdonald, male   DOB: 29-Mar-1955, 67 y.o.   MRN: [id number] [facility] Surgery Progress Note     Subjective: CC-  Remains on 2L supplemental O2. Continues to have airleak from chest tube.  Objective: Vital signs in last 24 hours: Temp:  [97.7 F (36.5 C)-98.6 F (37 C)] 97.8 F (36.6 C) (07/29 0740) Pulse Rate:  [103-104] 104 (07/29 0740) Resp:  [18-20] 18 (07/29 0740) BP: (108-145)/(67-91) 133/91 (07/29 0740) SpO2:  [93 %-95 %] 93 % (07/29 0740) Last BM Date : 09/15/22  Intake/Output from previous day: 07/28 0701 - 07/29 0700 In: 480 [P.O.:480] Out: 810 [Urine:800; Chest Tube:10] Intake/Output this shift: Total I/O In: -  Out: 175 [Urine:175]  PE: General appearance: Alert, NAD Resp: clear to auscultation bilaterally, rate and effort normal on 2L [place] Chest wall: sub cut air, chest tube with pretty continuous air leak  GI: soft, NT, ND Extremities: MAE Psych: A&Ox3  Lab Results:  No results for input(s): "WBC", "HGB", "HCT", "PLT" in the last 72 hours. BMET Recent Labs    09/21/22 0415 09/22/22 0343  NA 132* 130*  K 3.2* 4.2  CL 93* 96*  CO2 30 20*  GLUCOSE 99 82  BUN <5* 5*  CREATININE 0.52* 0.47*  CALCIUM 8.4* 8.4*   PT/INR No results for input(s): "LABPROT", "INR" in the last 72 hours. CMP     Component Value Date/Time   NA 130 (L) 09/22/2022 0343   K 4.2 09/22/2022 0343   CL 96 (L) 09/22/2022 0343   CO2 20 (L) 09/22/2022 0343   GLUCOSE 82 09/22/2022 0343   BUN 5 (L) 09/22/2022 0343   CREATININE 0.47 (L) 09/22/2022 0343   CREATININE 0.66 06/03/2021 0759   CALCIUM 8.4 (L) 09/22/2022 0343   PROT 7.1 09/15/2022 1853   ALBUMIN 3.0 (L) 09/15/2022 1853   AST 44 (H) 09/15/2022 1853   AST 25 06/03/2021 0759   ALT 23 09/15/2022 1853   ALT 11 06/03/2021 0759   ALKPHOS 121 09/15/2022 1853   BILITOT 0.7 09/15/2022 1853   BILITOT 0.8 06/03/2021 0759   GFRNONAA >60 09/22/2022 0343   GFRNONAA >60 06/03/2021 0759   GFRAA >60  09/09/2019 0207   Lipase  No results found for: "LIPASE"     Studies/Results: DG CHEST PORT 1 VIEW  Result Date: 09/22/2022 CLINICAL DATA:  Evaluate for pneumothorax. EXAM: PORTABLE CHEST 1 VIEW COMPARISON:  CT 09/21/2022 FINDINGS: There is a percutaneous pigtail thoracostomy tube which overlies the left upper lobe, unchanged in position from previous exam. Small residual left apical pneumothorax is noted which measures approximately 1.1 cm over the left apex, unchanged from 09/20/2022. Stable cardiomediastinal contours. Areas of atelectasis noted in the right mid and left lower lung. Unchanged appearance of bilateral chest wall and bilateral lower neck subcutaneous emphysema. IMPRESSION: Unchanged small left apical pneumothorax with left thoracostomy tube in place. Electronically Signed   By: Signa Kell M.D.   On: 09/22/2022 10:19   CT CHEST WO CONTRAST  Result Date: 09/21/2022 CLINICAL DATA:  Follow up pneumothorax EXAM: CT CHEST WITHOUT CONTRAST TECHNIQUE: Multidetector CT imaging of the chest was performed following the standard protocol without IV contrast. RADIATION DOSE REDUCTION: This exam was performed according to the departmental dose-optimization program which includes automated exposure control, adjustment of the mA and/or kV according to patient size and/or use of iterative reconstruction technique. COMPARISON:  X-ray 09/20/2022 and older.  CT 09/15/2022 FINDINGS: Cardiovascular: On this non IV contrast exam, the heart is  nonenlarged. No significant pericardial effusion. The thoracic aorta has a normal course and caliber with scattered vascular calcifications. Coronary artery calcifications are seen. Mediastinum/Nodes: The thoracic esophagus has a normal course and caliber. The esophagus is slightly patulous. Preserved thyroid gland. No discrete abnormal lymph node enlargement seen in the axillary regions, hilum or mediastinum on this noncontrast examination. Once again there is  pneumomediastinum. Extensive soft tissue gas throughout the thorax and visualized portions of the neck. Lungs/Pleura: Small left pleural effusion. There is lung base bilateral parenchymal opacities. Atelectasis versus infiltrate. Recommend follow-up. Underlying chronic lung changes identified with some reticular changes, subtle ground-glass and bronchial wall thickening. Right-sided apical subpleural blebs. There is a left-sided small pneumothorax identified. Pigtail catheter enters the thorax on the left side anterior laterally and extends somewhat into the left upper lobe of the lung. This could be repositioned as clinically appropriate. Upper Abdomen: Adrenal glands are preserved in the upper abdomen. Musculoskeletal: Left-sided rib fractures are again seen. Old right-sided fractures. IMPRESSION: Small left-sided pneumothorax again identified with a pigtail catheter in place. The pigtail catheter extends into the left upper lobe of the lung. This could be repositioned. Persistent extensive pneumomediastinum and chest wall gas. Small left effusion. Bilateral lower lobe parenchymal opacities. Atelectasis versus infiltrate. Recommend follow-up. Aortic Atherosclerosis (ICD10-I70.0) and Emphysema (ICD10-J43.9). Electronically Signed   By: Karen Kays M.D.   On: 09/21/2022 16:33    Anti-infectives: Anti-infectives (From admission, onward)    None        Assessment/Plan Fall L PTX with pneumomediastinum and extensive subcutaneous emphysema - s/p pigtail chest tube by EDP. Continuous airleak and CT yesterday showed chest tube in the upper lobe of the lung. Discussed with MD, will d/c chest tube today and monitor L rib fxs 10-11 - multimodal pain control and pulm toilet Nondisplaced L iliac crest fx - discussed with ortho, WBAT, no need for f/u Hypokalemia - 4.2, resolved Mild hyponatremia - stable off IVF, 130, monitor Alcohol abuse - drinks 8-9 beers daily. CIWA, beer TID. Social work consult for  SBIRT HTN - home med COPD - not on home O2 Former smoker - quit 6 years ago   ID - none VTE - SCDs, lovenox FEN - reg diet Foley - none   Dispo - 4np. PT/OT. Chest tube management as above  I reviewed last 24 h vitals and pain scores, last 48 h intake and output, last 24 h labs and trends, and last 24 h imaging results.  Addendum: Worsening swelling in face and air leaking from prior chest tube site following removal. Plan to place large bore chest tube today.    LOS: 7 days    Franne Forts, [facility] 09/22/2022, 10:56 AM Please see Amion for pager number during day hours 7:00am-4:30pm

## 2022-09-22 NOTE — Progress Notes (Signed)
Chest tube removed per order. Dressing with petroleum gauze, 4x4's, and compression tape placed. Pressure held to site. Shortly after removal, pt complained of hearing "spluttering" from the area. Heartrate 116. Increased swelling/air noted to chest. No new SOB, 92% on RA. Marc Mcdonald, [name], was alerted. Plan to replace chest tube.  1300 - Heartrate now in 120's. Pt complaining of swelling to face.   1310 - Increased swelling to face and neck. Rapid response nurse, [employer], was called. Now to bedside.   Robina Ade, RN

## 2022-09-22 NOTE — Procedures (Signed)
   Procedure Note  Date: 09/22/2022  Procedure: tube thoracostomy--left    Pre-op diagnosis: left pneumothorax  Post-op diagnosis: same  Surgeon: Diamantina Monks, MD  Anesthesia: local   EBL: <5cc procedural; 5cc SS fluid evacuated Drains/Implants: 33F chest tube Specimen: none  Description of procedure: Time-out was performed verifying correct patient, procedure, site, laterality, and signature of informed consent. Twenty-five cc's of local anesthetic was infiltrated into the tissues just over the fourth intercostal space.  A longitudinal incision was made parallel to the rib at the fourth intercostal space. This incision was deepened down through the muscle until the pleural cavity was entered. An audible air rush was encountered upon entry and a 33F chest tube was inserted through this tract.   The tube was secured at the skin with suture and connected to an atrium at -20cm water wall suction. Immediate output from the chest tube was 5cc and was serosanguinous. The site was dressed with xeroform, gauze, and tape. The patient tolerated the procedure well. There were no complications. Follow up chest x-ray was ordered to confirm tube positioning, complete evacuation, and complete lung re-expansion.    Diamantina Monks, MD General and Trauma Surgery [facility] Surgery

## 2022-09-22 NOTE — Progress Notes (Signed)
Trauma Event Note    Reason for Call : Increased worsening of swelling and air leaking from prior chest tube site; subcutaneous emphysema noted.  Initial Focused Assessment: - O2 sat 91% on RA - Subcutaneous air all over body  - Airway otherwise intact  - Swelling to eyelids - pt rapidly worsening and unable to see at this time.  Interventions: - Placed on 2L [place] - Assisted Dr. Bedelia Person with left chest tube 23F large bore  - CXR ordered  - CT suction set to -20 cm H2O; serosanguineous fluid coming out + air leak. - Minimal bleeding - Vitals stable  Plan of Care: - Pending CXR - Continue reassessing pt's airway and breathing  - Keep chest tube on -20cm H2O suction, assure patency.   Event Summary: Pt's initial pigtail chest tube had been removed at 1250 today.  Within 15 minutes, pt starting to experience SOB and build up of air/swelling all over body.  At this time it was decided by Dr. Bedelia Person to replace chest tube with a large bore chest tube.   Last imported Vital Signs BP 125/78   Pulse (!) 104   Temp 98.3 F (36.8 C) (Oral)   Resp 18   Ht 5\' 8"  (1.727 m)   Wt 136 lb 11 oz (62 kg)   SpO2 95%   BMI 20.78 kg/m   Trending CBC No results for input(s): "WBC", "HGB", "HCT", "PLT" in the last 72 hours.  Trending Coag's No results for input(s): "APTT", "INR" in the last 72 hours.  Trending BMET Recent Labs    09/21/22 0415 09/22/22 0343  NA 132* 130*  K 3.2* 4.2  CL 93* 96*  CO2 30 20*  BUN <5* 5*  CREATININE 0.52* 0.47*  GLUCOSE 99 82   Wakisha Alberts W  Trauma Response RN  Please call TRN at [phone] for further assistance.

## 2022-09-22 NOTE — Progress Notes (Signed)
Pt complaining of some increased swelling to face after it had slightly decreased post chest tube insertion. Pt does appear to be more swollen in his face. Vitals are unchanged and stable at this time. Pt 94-95% on 2L [place]. Dr. Andrey Campanile was notified. Per Dr. Andrey Campanile, thoracic surgery will likely need to become involved. For tonight, orders to monitor closely and reach back out with any changes. This was relayed to nightshift RN, Selinda Michaels.   Robina Ade, RN

## 2022-09-22 NOTE — Progress Notes (Signed)
PT Cancellation Note  Patient Details Name: Marc Mcdonald MRN: [id number] DOB: 07-26-1955   Cancelled Treatment:    Reason Eval/Treat Not Completed: Medical issues which prohibited therapy (chest tube just removed and pt in distress). Will check back tomorrow.   Lyanne Co, PT  Acute Rehab Services Secure chat preferred Office [phone]    Lawana Chambers [name] 09/22/2022, 1:09 PM

## 2022-09-22 NOTE — Progress Notes (Signed)
Assisted Dr Bedelia Person with bedside chest tube placement.

## 2022-09-23 ENCOUNTER — Inpatient Hospital Stay (HOSPITAL_COMMUNITY): Payer: Medicare Other

## 2022-09-23 MED ORDER — MAGNESIUM SULFATE 2 GM/50ML IV SOLN
2.0000 g | Freq: Once | INTRAVENOUS | Status: AC
  Administered 2022-09-23: 2 g via INTRAVENOUS
  Filled 2022-09-23: qty 50

## 2022-09-23 MED ORDER — SODIUM CHLORIDE 1 G PO TABS
1.0000 g | ORAL_TABLET | Freq: Three times a day (TID) | ORAL | Status: DC
  Administered 2022-09-23 – 2022-09-28 (×16): 1 g via ORAL
  Filled 2022-09-23 (×16): qty 1

## 2022-09-23 NOTE — Progress Notes (Signed)
Patient ID: Marc Mcdonald, male   DOB: [birth year], 67 y.o.   MRN: [id number] [facility] Surgery Progress Note     Subjective: CC-  Large bore chest tube placed yesterday. Continues to have air leak. He states that subcutaneous air briefly improved yesterday after chest tube placement, but is worse again. Remains stable on 2L.  Objective: Vital signs in last 24 hours: Temp:  [97.6 F (36.4 C)-98.4 F (36.9 C)] 98.3 F (36.8 C) (07/30 0729) Pulse Rate:  [96-133] 105 (07/30 0729) Resp:  [14-29] 19 (07/30 0729) BP: (117-143)/(72-88) 134/77 (07/30 0729) SpO2:  [89 %-97 %] 92 % (07/30 0729) Last BM Date : 09/14/22  Intake/Output from previous day: 07/29 0701 - 07/30 0700 In: -  Out: 593 [Urine:525; Chest Tube:68] Intake/Output this shift: No intake/output data recorded.  PE: General appearance: Alert, NAD Resp: clear to auscultation bilaterally, rate and effort normal on 2L [place] Chest wall: sub cut air in chest and face, chest tube with pretty continuous air leak  GI: soft, NT, ND Extremities: MAE Psych: A&Ox3  Lab Results:  No results for input(s): "WBC", "HGB", "HCT", "PLT" in the last 72 hours. BMET Recent Labs    09/22/22 0343 09/23/22 0329  NA 130* 130*  K 4.2 3.9  CL 96* 94*  CO2 20* 23  GLUCOSE 82 83  BUN 5* 5*  CREATININE 0.47* 0.52*  CALCIUM 8.4* 8.4*   PT/INR No results for input(s): "LABPROT", "INR" in the last 72 hours. CMP     Component Value Date/Time   NA 130 (L) 09/23/2022 0329   K 3.9 09/23/2022 0329   CL 94 (L) 09/23/2022 0329   CO2 23 09/23/2022 0329   GLUCOSE 83 09/23/2022 0329   BUN 5 (L) 09/23/2022 0329   CREATININE 0.52 (L) 09/23/2022 0329   CREATININE 0.66 06/03/2021 0759   CALCIUM 8.4 (L) 09/23/2022 0329   PROT 7.1 09/15/2022 1853   ALBUMIN 3.0 (L) 09/15/2022 1853   AST 44 (H) 09/15/2022 1853   AST 25 06/03/2021 0759   ALT 23 09/15/2022 1853   ALT 11 06/03/2021 0759   ALKPHOS 121 09/15/2022 1853   BILITOT 0.7 09/15/2022  1853   BILITOT 0.8 06/03/2021 0759   GFRNONAA >60 09/23/2022 0329   GFRNONAA >60 06/03/2021 0759   GFRAA >60 09/09/2019 0207   Lipase  No results found for: "LIPASE"     Studies/Results: DG CHEST PORT 1 VIEW  Result Date: 09/23/2022 CLINICAL DATA:  [id number] Admission for chest tube placement [id number] EXAM: PORTABLE CHEST 1 VIEW COMPARISON:  09/22/2022. FINDINGS: Redemonstration of extensive surgical emphysema, which limits evaluation for pneumothorax. However, having said that no discrete pneumothorax seen. Left-sided pleural drainage catheter appears unchanged in position. There are patchy atelectatic changes at the lung bases. Bilateral lung fields are otherwise grossly clear. Bilateral costophrenic angles are clear. Normal cardio-mediastinal silhouette. No acute osseous abnormalities. The soft tissues are within normal limits. IMPRESSION: 1. Extensive surgical emphysema, which limits evaluation for pneumothorax. However, having said that no discrete pneumothorax seen. 2. Left-sided pleural drainage catheter unchanged in position. Electronically Signed   By: Jules Schick M.D.   On: 09/23/2022 08:59   DG CHEST PORT 1 VIEW  Result Date: 09/22/2022 CLINICAL DATA:  Chest tube placement EXAM: PORTABLE CHEST 1 VIEW COMPARISON:  Prior radiographs obtained earlier today at 6 a.m. FINDINGS: Interval removal of pigtail thoracostomy tube from the left upper lung with placement of a larger bore surgical chest tube. Severe diffuse subcutaneous emphysema which slightly limits  evaluation of the underlying lung parenchyma and limits detection of pneumothorax. No definite residual pneumothorax identified. Linear atelectasis and scarring in both lung bases. Cardiac and mediastinal contours are within normal limits. IMPRESSION: 1. Interval removal of pigtail thoracostomy tube with placement of a larger bore surgical chest tube. 2. No definite pneumothorax identified although evaluation is limited due to extensive  subcutaneous emphysema. 3. Similar appearance of extensive subcutaneous emphysema. 4. Bibasilar scarring. Electronically Signed   By: Malachy Moan M.D.   On: 09/22/2022 15:08   DG CHEST PORT 1 VIEW  Result Date: 09/22/2022 CLINICAL DATA:  Evaluate for pneumothorax. EXAM: PORTABLE CHEST 1 VIEW COMPARISON:  CT 09/21/2022 FINDINGS: There is a percutaneous pigtail thoracostomy tube which overlies the left upper lobe, unchanged in position from previous exam. Small residual left apical pneumothorax is noted which measures approximately 1.1 cm over the left apex, unchanged from 09/20/2022. Stable cardiomediastinal contours. Areas of atelectasis noted in the right mid and left lower lung. Unchanged appearance of bilateral chest wall and bilateral lower neck subcutaneous emphysema. IMPRESSION: Unchanged small left apical pneumothorax with left thoracostomy tube in place. Electronically Signed   By: Signa Kell M.D.   On: 09/22/2022 10:19   CT CHEST WO CONTRAST  Result Date: 09/21/2022 CLINICAL DATA:  Follow up pneumothorax EXAM: CT CHEST WITHOUT CONTRAST TECHNIQUE: Multidetector CT imaging of the chest was performed following the standard protocol without IV contrast. RADIATION DOSE REDUCTION: This exam was performed according to the departmental dose-optimization program which includes automated exposure control, adjustment of the mA and/or kV according to patient size and/or use of iterative reconstruction technique. COMPARISON:  X-ray 09/20/2022 and older.  CT 09/15/2022 FINDINGS: Cardiovascular: On this non IV contrast exam, the heart is nonenlarged. No significant pericardial effusion. The thoracic aorta has a normal course and caliber with scattered vascular calcifications. Coronary artery calcifications are seen. Mediastinum/Nodes: The thoracic esophagus has a normal course and caliber. The esophagus is slightly patulous. Preserved thyroid gland. No discrete abnormal lymph node enlargement seen in the  axillary regions, hilum or mediastinum on this noncontrast examination. Once again there is pneumomediastinum. Extensive soft tissue gas throughout the thorax and visualized portions of the neck. Lungs/Pleura: Small left pleural effusion. There is lung base bilateral parenchymal opacities. Atelectasis versus infiltrate. Recommend follow-up. Underlying chronic lung changes identified with some reticular changes, subtle ground-glass and bronchial wall thickening. Right-sided apical subpleural blebs. There is a left-sided small pneumothorax identified. Pigtail catheter enters the thorax on the left side anterior laterally and extends somewhat into the left upper lobe of the lung. This could be repositioned as clinically appropriate. Upper Abdomen: Adrenal glands are preserved in the upper abdomen. Musculoskeletal: Left-sided rib fractures are again seen. Old right-sided fractures. IMPRESSION: Small left-sided pneumothorax again identified with a pigtail catheter in place. The pigtail catheter extends into the left upper lobe of the lung. This could be repositioned. Persistent extensive pneumomediastinum and chest wall gas. Small left effusion. Bilateral lower lobe parenchymal opacities. Atelectasis versus infiltrate. Recommend follow-up. Aortic Atherosclerosis (ICD10-I70.0) and Emphysema (ICD10-J43.9). Electronically Signed   By: Karen Kays M.D.   On: 09/21/2022 16:33    Anti-infectives: Anti-infectives (From admission, onward)    None        Assessment/Plan Fall L PTX with pneumomediastinum and extensive subcutaneous emphysema - s/p pigtail chest tube by EDP 7/22. Continued to have airleak and CT 7/28 showed chest tube in the upper lobe of the lung. Exchanged pigtail for large bore chest tube 7/30. CXR appears stable.  Continues to have air leak. Discussed with MD, continue CT to -20cm today L rib fxs 10-11 - multimodal pain control and pulm toilet Nondisplaced L iliac crest fx - discussed with ortho,  WBAT, no need for f/u Hypokalemia - 3.9, resolved Hypomagnesemia - 1.5, replace  Mild hyponatremia - stable off IVF, 130, salt tabs 1g TID and monitor Alcohol abuse - drinks 8-9 beers daily. CIWA, beer TID. Social work consult for SBIRT HTN - home med COPD - not on home O2 Former smoker - quit 6 years ago   ID - none VTE - SCDs, lovenox FEN - reg diet Foley - none   Dispo - 4np. PT/OT - no therapy follow up recommended. Chest tube management as above.  I reviewed last 24 h vitals and pain scores, last 48 h intake and output, last 24 h labs and trends, and last 24 h imaging results.    LOS: 8 days    Franne Forts, [facility] Surgery 09/23/2022, 9:59 AM Please see Amion for pager number during day hours 7:00am-4:30pm

## 2022-09-23 NOTE — TOC CM/SW Note (Signed)
Transition of Care [facility]) - Inpatient Brief Assessment   Patient Details  Name: Marc Mcdonald MRN: [id number] Date of Birth: 03-08-1955  Transition of Care [facility]) CM/SW Contact:    Darrold Span, RN Phone Number: 09/23/2022, 3:02 PM   Clinical Narrative: Pt admit s/p fall at home, with Pntx and chest tube placement- We will continue to monitor patient advancement through interdisciplinary progression rounds. If new patient transition needs arise, please place a TOC consult.   Transition of Care Asessment: Insurance and Status: Insurance coverage has been reviewed Patient has primary care physician: Yes Home environment has been reviewed: from home Prior level of function:: independent Prior/Current Home Services: No current home services Social Determinants of Health Reivew: SDOH reviewed no interventions necessary Readmission risk has been reviewed: Yes Transition of care needs: transition of care needs identified, TOC will continue to follow (needs SA assessment/resources)

## 2022-09-23 NOTE — Care Management Important Message (Signed)
Important Message  Patient Details  Name: GURDON SCHWOERER MRN: [id number] Date of Birth: [birth year]   Medicare Important Message Given:  Yes     Sherilyn Banker 09/23/2022, 2:58 PM

## 2022-09-24 ENCOUNTER — Inpatient Hospital Stay (HOSPITAL_COMMUNITY): Payer: Medicare Other

## 2022-09-24 NOTE — Progress Notes (Signed)
OT Cancellation Note  Patient Details Name: ZYION SMIALEK MRN: [id number] DOB: [birth year]   Cancelled Treatment:    Reason Eval/Treat Not Completed: Fatigue/lethargy limiting ability to participate-pt declined, reports fatigued from bath this am.  Will check back as able.   Barry Brunner, OT Acute Rehabilitation Services Office [phone]   Chancy Milroy 09/24/2022, 10:57 AM

## 2022-09-24 NOTE — Progress Notes (Signed)
Physical Therapy Treatment Patient Details Name: Marc Mcdonald MRN: [id number] DOB: 1955/08/12 Today's Date: 09/24/2022   History of Present Illness Pt is a 67 y.o. M who presents 09/15/2022 after a ground level fall at home. Found to have L PTX with pneumomediastinum and extensive subcutaneous emphysema s/p pigtal chest tube, L rib fxs 10-11, nondisplaced L iliac fx. Pt with significant PMH of COPD, HTN, alcohol abuse.    PT Comments  Pt agreeable to participate with encouragement. Pt is overall mobilizing well. Transferred chest tube to portable suction for walk. Ambulating 300 ft with a walker without physical assist.  SpO2 93% on 2L [place], HR peak 121 bpm. Will continue to follow acutely, but no follow up PT recommended.   If plan is discharge home, recommend the following: Assistance with cooking/housework;Assist for transportation;Help with stairs or ramp for entrance   Can travel by private vehicle        Equipment Recommendations  None recommended by PT    Recommendations for Other Services       Precautions / Restrictions Precautions Precautions: Fall;Other (comment) Precaution Comments: L rib fxs, CT to portable suction for ambulation Restrictions Weight Bearing Restrictions: No     Mobility  Bed Mobility Overal bed mobility: Modified Independent                  Transfers Overall transfer level: Needs assistance Equipment used: Rolling walker (2 wheels) Transfers: Sit to/from Stand Sit to Stand: Supervision           General transfer comment: supervision for safety    Ambulation/Gait Ambulation/Gait assistance: Supervision Gait Distance (Feet): 300 Feet Assistive device: Rolling walker (2 wheels) Gait Pattern/deviations: Step-through pattern, Decreased stride length           Stairs             Wheelchair Mobility     Tilt Bed    Modified Rankin (Stroke Patients Only)       Balance Overall balance assessment: Mild deficits  observed, not formally tested                                          Cognition Arousal/Alertness: Awake/alert Behavior During Therapy: WFL for tasks assessed/performed Overall Cognitive Status: Within Functional Limits for tasks assessed                                          Exercises      General Comments        Pertinent Vitals/Pain Pain Assessment Pain Assessment: Faces Faces Pain Scale: Hurts a little bit Pain Location: L ribs Pain Descriptors / Indicators: Discomfort, Grimacing, Guarding Pain Intervention(s): Monitored during session    Home Living                          Prior Function            PT Goals (current goals can now be found in the care plan section) Acute Rehab PT Goals Patient Stated Goal: home Potential to Achieve Goals: Good Progress towards PT goals: Progressing toward goals    Frequency    Min 1X/week      PT Plan Current plan remains appropriate    Co-evaluation  AM-PAC PT "6 Clicks" Mobility   Outcome Measure  Help needed turning from your back to your side while in a flat bed without using bedrails?: None Help needed moving from lying on your back to sitting on the side of a flat bed without using bedrails?: None Help needed moving to and from a bed to a chair (including a wheelchair)?: A Little Help needed standing up from a chair using your arms (e.g., wheelchair or bedside chair)?: A Little Help needed to walk in hospital room?: A Little Help needed climbing 3-5 steps with a railing? : A Little 6 Click Score: 20    End of Session Equipment Utilized During Treatment: Oxygen;Other (comment) (portable chest tube suction) Activity Tolerance: Patient tolerated treatment well Patient left: in bed;with call bell/phone within reach Nurse Communication: Mobility status PT Visit Diagnosis: Difficulty in walking, not elsewhere classified (R26.2);Pain Pain -  Right/Left: Left     Time: 1348-1410 PT Time Calculation (min) (ACUTE ONLY): 22 min  Charges:    $Therapeutic Activity: 8-22 mins PT General Charges $$ ACUTE PT VISIT: 1 Visit                     Lillia Pauls, PT, DPT Acute Rehabilitation Services Office [phone]    Norval Morton 09/24/2022, 3:15 PM

## 2022-09-24 NOTE — Progress Notes (Addendum)
Patient ID: Marc Mcdonald, male   DOB: 16-Aug-1955, 67 y.o.   MRN: [id number] [facility] Surgery Progress Note     Subjective: CC-  In good spirits today. Subcutaneous air improving, he is able to open his eyes. O2 sats stable on 2L. Coughing up phlegm. Still with air leak from chest tube this morning but it is much more intermittent.  Objective: Vital signs in last 24 hours: Temp:  [97.7 F (36.5 C)-98.6 F (37 C)] 97.7 F (36.5 C) (07/31 0734) Pulse Rate:  [93-110] 110 (07/31 0734) Resp:  [20-23] 22 (07/31 0734) BP: (121-140)/(75-88) 127/79 (07/31 0734) SpO2:  [92 %-96 %] 92 % (07/31 0734) Last BM Date : 09/23/22  Intake/Output from previous day: 07/30 0701 - 07/31 0700 In: 240 [P.O.:240] Out: 751 [Urine:715; Chest Tube:36] Intake/Output this shift: Total I/O In: -  Out: 200 [Urine:200]  PE: General appearance: Alert, NAD Resp: clear to auscultation bilaterally, rate and effort normal on 2L [place] Chest wall: sub cut air in chest and face significantly improved, chest tube with intermittent air leak  GI: soft, NT, ND Extremities: MAE Psych: A&Ox3  Lab Results:  No results for input(s): "WBC", "HGB", "HCT", "PLT" in the last 72 hours. BMET Recent Labs    09/23/22 0329 09/24/22 0234  NA 130* 131*  K 3.9 3.9  CL 94* 96*  CO2 23 25  GLUCOSE 83 90  BUN 5* <5*  CREATININE 0.52* 0.45*  CALCIUM 8.4* 8.2*   PT/INR No results for input(s): "LABPROT", "INR" in the last 72 hours. CMP     Component Value Date/Time   NA 131 (L) 09/24/2022 0234   K 3.9 09/24/2022 0234   CL 96 (L) 09/24/2022 0234   CO2 25 09/24/2022 0234   GLUCOSE 90 09/24/2022 0234   BUN <5 (L) 09/24/2022 0234   CREATININE 0.45 (L) 09/24/2022 0234   CREATININE 0.66 06/03/2021 0759   CALCIUM 8.2 (L) 09/24/2022 0234   PROT 7.1 09/15/2022 1853   ALBUMIN 3.0 (L) 09/15/2022 1853   AST 44 (H) 09/15/2022 1853   AST 25 06/03/2021 0759   ALT 23 09/15/2022 1853   ALT 11 06/03/2021 0759   ALKPHOS 121  09/15/2022 1853   BILITOT 0.7 09/15/2022 1853   BILITOT 0.8 06/03/2021 0759   GFRNONAA >60 09/24/2022 0234   GFRNONAA >60 06/03/2021 0759   GFRAA >60 09/09/2019 0207   Lipase  No results found for: "LIPASE"     Studies/Results: DG CHEST PORT 1 VIEW  Result Date: 09/23/2022 CLINICAL DATA:  [id number] Admission for chest tube placement [id number] EXAM: PORTABLE CHEST 1 VIEW COMPARISON:  09/22/2022. FINDINGS: Redemonstration of extensive surgical emphysema, which limits evaluation for pneumothorax. However, having said that no discrete pneumothorax seen. Left-sided pleural drainage catheter appears unchanged in position. There are patchy atelectatic changes at the lung bases. Bilateral lung fields are otherwise grossly clear. Bilateral costophrenic angles are clear. Normal cardio-mediastinal silhouette. No acute osseous abnormalities. The soft tissues are within normal limits. IMPRESSION: 1. Extensive surgical emphysema, which limits evaluation for pneumothorax. However, having said that no discrete pneumothorax seen. 2. Left-sided pleural drainage catheter unchanged in position. Electronically Signed   By: Jules Schick M.D.   On: 09/23/2022 08:59   DG CHEST PORT 1 VIEW  Result Date: 09/22/2022 CLINICAL DATA:  Chest tube placement EXAM: PORTABLE CHEST 1 VIEW COMPARISON:  Prior radiographs obtained earlier today at 6 a.m. FINDINGS: Interval removal of pigtail thoracostomy tube from the left upper lung with placement of a larger  bore surgical chest tube. Severe diffuse subcutaneous emphysema which slightly limits evaluation of the underlying lung parenchyma and limits detection of pneumothorax. No definite residual pneumothorax identified. Linear atelectasis and scarring in both lung bases. Cardiac and mediastinal contours are within normal limits. IMPRESSION: 1. Interval removal of pigtail thoracostomy tube with placement of a larger bore surgical chest tube. 2. No definite pneumothorax identified although  evaluation is limited due to extensive subcutaneous emphysema. 3. Similar appearance of extensive subcutaneous emphysema. 4. Bibasilar scarring. Electronically Signed   By: Malachy Moan M.D.   On: 09/22/2022 15:08    Anti-infectives: Anti-infectives (From admission, onward)    None        Assessment/Plan Fall L PTX with pneumomediastinum and extensive subcutaneous emphysema - s/p pigtail chest tube by EDP 7/22. Continued to have airleak and CT 7/28 showed chest tube in the upper lobe of the lung. Exchanged pigtail for large bore chest tube 7/30. CXR appears stable. Continues to have air leak but it is intermittent rather than constant. Continue suction to -20 L rib fxs 10-11 - multimodal pain control and pulm toilet Nondisplaced L iliac crest fx - discussed with ortho, WBAT, no need for f/u Hypokalemia - 3.9, resolved. Check periodically, will give him a break from labs tomorrow Hypomagnesemia - 1.8, resolved Mild hyponatremia - stable off IVF, 131, salt tabs 1g TID and monitor Alcohol abuse - drinks 8-9 beers daily. CIWA, beer TID. Social work consult for SBIRT HTN - home med COPD - not on home O2 Former smoker - quit 6 years ago   ID - none VTE - SCDs, lovenox FEN - reg diet Foley - none   Dispo - 4np. PT/OT - no therapy follow up recommended. Chest tube management as above.  I reviewed last 24 h vitals and pain scores, last 48 h intake and output, last 24 h labs and trends, and last 24 h imaging results.    LOS: 9 days    Franne Forts, [facility] Surgery 09/24/2022, 9:11 AM Please see Amion for pager number during day hours 7:00am-4:30pm

## 2022-09-25 NOTE — Plan of Care (Signed)
  Problem: Education: Goal: Knowledge of General Education information will improve Description: Including pain rating scale, medication(s)/side effects and non-pharmacologic comfort measures Outcome: Progressing   Problem: Health Behavior/Discharge Planning: Goal: Ability to manage health-related needs will improve Outcome: Progressing   Problem: Clinical Measurements: Goal: Will remain free from infection Outcome: Progressing Goal: Respiratory complications will improve Outcome: Progressing Goal: Cardiovascular complication will be avoided Outcome: Progressing   Problem: Nutrition: Goal: Adequate nutrition will be maintained Outcome: Progressing   Problem: Elimination: Goal: Will not experience complications related to bowel motility Outcome: Progressing Goal: Will not experience complications related to urinary retention Outcome: Progressing

## 2022-09-25 NOTE — Progress Notes (Signed)
Occupational Therapy Treatment Patient Details Name: Marc Mcdonald MRN: [id number] DOB: 03-23-1955 Today's Date: 09/25/2022   History of present illness Pt is a 67 y.o. M who presents 09/15/2022 after a ground level fall at home. Found to have L PTX with pneumomediastinum and extensive subcutaneous emphysema s/p pigtal chest tube, L rib fxs 10-11, nondisplaced L iliac fx. Pt with significant PMH of COPD, HTN, alcohol abuse.   OT comments  Pt with HR in the 115-119 range with mobility.  Oxygen sats maintained above 90% on 2 Ls nasal cannula.  Pt able to complete toilet transfers and toileting a min assist level for sit to stand from the lower toilet without use of the rail but with use of the RW.  Supervision for sit to stand from the bed and functional mobility greater than 300' with the RW.  Feel he is making steady progress and is eager to get his chest tube removed and get home.  Recommend continued acute care OT at this time in order to increase ADL performance to modified independent level for home.  Pt has intermittent assist from his boys but not 24 hr.     Recommendations for follow up therapy are one component of a multi-disciplinary discharge planning process, led by the attending physician.  Recommendations may be updated based on patient status, additional functional criteria and insurance authorization.    Assistance Recommended at Discharge Intermittent Supervision/Assistance  Patient can return home with the following  A little help with walking and/or transfers;A little help with bathing/dressing/bathroom;Assistance with cooking/housework;Help with stairs or ramp for entrance;Assist for transportation   Equipment Recommendations  None recommended by OT       Precautions / Restrictions Precautions Precautions: Fall;Other (comment) Precaution Comments: L rib fxs, CT to portable suction for ambulation Restrictions Weight Bearing Restrictions: No       Mobility Bed  Mobility Overal bed mobility: Needs Assistance Bed Mobility: Supine to Sit     Supine to sit: Supervision Sit to supine: Supervision        Transfers Overall transfer level: Needs assistance Equipment used: Rolling walker (2 wheels) Transfers: Sit to/from Stand (supervision for sit to stand from the EOB with min assist to stand from lower toilet.) Sit to Stand: Supervision     Step pivot transfers: Supervision     General transfer comment: Pt supervision for transfer from the EOB with use of the RW and for mobilization out of the room and back greater than 300".     Balance Overall balance assessment: Needs assistance   Sitting balance-Leahy Scale: Good     Standing balance support: During functional activity, Reliant on assistive device for balance Standing balance-Leahy Scale: Fair Standing balance comment: Pt needs UE support on the RW for balance with mobility.                           ADL either performed or assessed with clinical judgement   ADL Overall ADL's : Needs assistance/impaired     Grooming: Supervision/safety;Standing Grooming Details (indicate cue type and reason): simulated                 Toilet Transfer: Minimal assistance;Regular Toilet;Rolling walker (2 wheels) Toilet Transfer Details (indicate cue type and reason): Min assist for sit to stand from the lower toilet without use of the grab bars. Toileting- Clothing Manipulation and Hygiene: Minimal assistance;Sit to/from stand       Functional mobility during ADLs: Supervision/safety;Rolling  walker (2 wheels) General ADL Comments: Pt's O2 sats at 90% or better throughout toileting and mobility on 2Ls nasal cannula.  Slight difficulty with sit to stand from the lower toilet.  HR maintained at 115-119 with activity.      Cognition Arousal/Alertness: Awake/alert Behavior During Therapy: WFL for tasks assessed/performed, Agitated (Slightly agitated to start that he can't get  good rest, but improved throughout session.) Overall Cognitive Status: Within Functional Limits for tasks assessed                                                     Pertinent Vitals/ Pain       Pain Assessment Pain Assessment: Faces Faces Pain Scale: Hurts little more Pain Location: L ribs and side Pain Descriptors / Indicators: Discomfort, Grimacing Pain Intervention(s): Monitored during session, Repositioned         Frequency  Min 1X/week        Progress Toward Goals  OT Goals(current goals can now be found in the care plan section)  Progress towards OT goals: Progressing toward goals  Acute Rehab OT Goals Patient Stated Goal: Go home, he's tired of being in the hospital OT Goal Formulation: With patient Time For Goal Achievement: 09/30/22 Potential to Achieve Goals: Good  Plan Discharge plan remains appropriate       AM-PAC OT "6 Clicks" Daily Activity     Outcome Measure   Help from another person eating meals?: None Help from another person taking care of personal grooming?: A Little Help from another person toileting, which includes using toliet, bedpan, or urinal?: A Little Help from another person bathing (including washing, rinsing, drying)?: A Little Help from another person to put on and taking off regular upper body clothing?: A Little Help from another person to put on and taking off regular lower body clothing?: A Little 6 Click Score: 19    End of Session Equipment Utilized During Treatment: Rolling walker (2 wheels);Oxygen;Other (comment) (portable suction for chest tube)  OT Visit Diagnosis: Other abnormalities of gait and mobility (R26.89);Pain;History of falling (Z91.81)   Activity Tolerance Patient tolerated treatment well   Patient Left with call bell/phone within reach;in bed;with bed alarm set   Nurse Communication Mobility status        Time: 6433-2951 OT Time Calculation (min): 39 min  Charges: OT General  Charges $OT Visit: 1 Visit OT Treatments $Self Care/Home Management : 38-52 mins  Perrin Maltese, OTR/L Acute Rehabilitation Services  Office [phone] 09/25/2022

## 2022-09-25 NOTE — Progress Notes (Signed)
   Trauma/Critical Care Follow Up Note  Subjective:    Overnight Issues:   Objective:  Vital signs for last 24 hours: Temp:  [97.6 F (36.4 C)-98.6 F (37 C)] 97.8 F (36.6 C) (08/01 0740) Pulse Rate:  [87-115] 103 (08/01 0740) Resp:  [15-20] 20 (08/01 0740) BP: (120-152)/(70-91) 141/91 (08/01 0740) SpO2:  [92 %-97 %] 92 % (08/01 0740)  Hemodynamic parameters for last 24 hours:    Intake/Output from previous day: 07/31 0701 - 08/01 0700 In: -  Out: 1233 [Urine:1075; Chest Tube:158]  Intake/Output this shift: Total I/O In: -  Out: 200 [Urine:200]  Vent settings for last 24 hours:    Physical Exam:  Gen: comfortable, no distress Neuro: follows commands, alert, communicative HEENT: PERRL Neck: supple CV: RRR Pulm: unlabored breathing on [place] Abd: soft, NT    GU: urine clear and yellow, +spontaneous voids Extr: wwp, no edema  No results found for this or any previous visit (from the past 24 hour(s)).  Assessment & Plan:  Present on Admission:  Pneumothorax, left    LOS: 10 days   Additional comments:I reviewed the patient's new clinical lab test results.   and I reviewed the patients new imaging test results.    Fall  L PTX with pneumomediastinum and extensive subcutaneous emphysema - s/p pigtail chest tube by EDP 7/22. Continued to have airleak and CT 7/28 showed chest tube in the upper lobe of the lung. Exchanged pigtail for large bore chest tube 7/30. CXR appears stable. Continues to have air leak but it is intermittent rather than constant. Continue suction to -10 L rib fxs 10-11 - multimodal pain control and pulm toilet Nondisplaced L iliac crest fx - discussed with ortho, WBAT, no need for f/u Hypokalemia - 3.9, resolved. Check periodically, will give him a break from labs tomorrow Hypomagnesemia - 1.8, resolved Mild hyponatremia - stable off IVF, 131, salt tabs 1g TID and monitor Alcohol abuse - drinks 8-9 beers daily. CIWA, beer TID. Social work  consult for SBIRT HTN - home med COPD - not on home O2 Former smoker - quit 6 years ago   ID - none VTE - SCDs, lovenox FEN - reg diet Foley - none   Dispo - 4NP. PT/OT - no therapy follow up recommended. Chest tube management as above.  Diamantina Monks, MD Trauma & General Surgery Please use [email or web address] to contact on call provider  09/25/2022  *Care during the described time interval was provided by me. I have reviewed this patient's available data, including medical history, events of note, physical examination and test results as part of my evaluation.

## 2022-09-26 ENCOUNTER — Inpatient Hospital Stay (HOSPITAL_COMMUNITY): Payer: Medicare Other

## 2022-09-26 MED ORDER — IPRATROPIUM-ALBUTEROL 0.5-2.5 (3) MG/3ML IN SOLN
3.0000 mL | RESPIRATORY_TRACT | Status: AC
  Administered 2022-09-26 – 2022-09-27 (×4): 3 mL via RESPIRATORY_TRACT
  Filled 2022-09-26 (×6): qty 3

## 2022-09-26 NOTE — Progress Notes (Addendum)
Physical Therapy Treatment Patient Details Name: Marc Mcdonald MRN: [id number] DOB: 08-13-1955 Today's Date: 09/26/2022   History of Present Illness Pt is a 67 y.o. M who presents 09/15/2022 after a ground level fall at home. Found to have L PTX with pneumomediastinum and extensive subcutaneous emphysema s/p pigtal chest tube, L rib fxs 10-11, nondisplaced L iliac fx. Pt with significant PMH of COPD, HTN, alcohol abuse.    PT Comments  Focused session on progressing pt away from utilizing an AD to ambulate as pt does not typically utilize one at baseline per his report. He initially was displaying instability in his legs but the further he ambulated with the RW the more stable he was once the RW was removed, min guard for safety. Pt also noted to have memory deficits this date. Pt stated "I haven't seen any of you all for months and now I seen 15 of you all today", referring to therapy even though this was the only therapist who had seen him today and he was seen the past 2 days in a row by PT and OT. Pt contradicting himself at times as well. RN aware. Will continue to follow acutely.    If plan is discharge home, recommend the following: Assistance with cooking/housework;Assist for transportation;Help with stairs or ramp for entrance;Direct supervision/assist for medications management;Direct supervision/assist for financial management   Can travel by private vehicle        Equipment Recommendations  None recommended by PT    Recommendations for Other Services       Precautions / Restrictions Precautions Precautions: Fall;Other (comment) Precaution Comments: L rib fxs, chest tube, watch SpO2, watch HR Restrictions Weight Bearing Restrictions: No     Mobility  Bed Mobility Overal bed mobility: Needs Assistance Bed Mobility: Supine to Sit, Sit to Supine     Supine to sit: Supervision, HOB elevated Sit to supine: Supervision, HOB elevated   General bed mobility comments:  Supervision for line management    Transfers Overall transfer level: Needs assistance Equipment used: None Transfers: Sit to/from Stand Sit to Stand: Min guard           General transfer comment: Stood from EOB to no AD with noted shakiness in legs and posterior sway with pt leaning legs against bed for balance, reaching out for bed rail for support, min guard for safety    Ambulation/Gait Ambulation/Gait assistance: Supervision, Min guard Gait Distance (Feet): 450 Feet Assistive device: Rolling walker (2 wheels), None Gait Pattern/deviations: Step-through pattern, Decreased stride length Gait velocity: reduced Gait velocity interpretation: 1.31 - 2.62 ft/sec, indicative of limited community ambulator   General Gait Details: Initially took a couple steps at EOB without UE support with shakiness in legs noted, thus transitioned pt to RW. Pt ambulated >200 ft with RW without LOB at a supervision level before attempting to ambulate without it again with noted improved stability. Pt ambulated remaining ~80 ft without UE support, min guard for safety   Stairs             Wheelchair Mobility     Tilt Bed    Modified Rankin (Stroke Patients Only)       Balance Overall balance assessment: Mild deficits observed, not formally tested                                          Cognition Arousal/Alertness: Awake/alert Behavior  During Therapy: WFL for tasks assessed/performed Overall Cognitive Status: Impaired/Different from baseline Area of Impairment: Memory                     Memory: Decreased short-term memory         General Comments: Pt stated "I haven't seen any of you all for months and now I seen 15 of you all today", referring to therapy even though this was the only therapist who had seen him today and he was seen the past 2 days in a row by PT and OT. Pt contradicting himself in regards to needing or not needing to use the RW at  this time.        Exercises      General Comments General comments (skin integrity, edema, etc.): SpO2 down to 79% on RA when ambulating (unreliable pleth though with noted reliable pleth in 80s% on RA when ambulating), needing 2L O2 via [place] to maintain sats >/= 90% when ambulating, >/= 91% on RA at rest - returned pt to 2L end of session though; HR up to 141, recovered with rest breaks      Pertinent Vitals/Pain Pain Assessment Pain Assessment: Faces Faces Pain Scale: Hurts little more Pain Location: L ribs Pain Descriptors / Indicators: Discomfort, Grimacing, Guarding, Moaning Pain Intervention(s): Limited activity within patient's tolerance, Monitored during session, Repositioned    Home Living                          Prior Function            PT Goals (current goals can now be found in the care plan section) Acute Rehab PT Goals Patient Stated Goal: home PT Goal Formulation: With patient Time For Goal Achievement: 09/30/22 Potential to Achieve Goals: Good Progress towards PT goals: Progressing toward goals    Frequency    Min 1X/week      PT Plan Current plan remains appropriate    Co-evaluation              AM-PAC PT "6 Clicks" Mobility   Outcome Measure  Help needed turning from your back to your side while in a flat bed without using bedrails?: None Help needed moving from lying on your back to sitting on the side of a flat bed without using bedrails?: A Little Help needed moving to and from a bed to a chair (including a wheelchair)?: A Little Help needed standing up from a chair using your arms (e.g., wheelchair or bedside chair)?: A Little Help needed to walk in hospital room?: A Little Help needed climbing 3-5 steps with a railing? : A Little 6 Click Score: 19    End of Session Equipment Utilized During Treatment: Oxygen;Gait belt Activity Tolerance: Patient tolerated treatment well Patient left: in bed;with call bell/phone within  reach;with bed alarm set Nurse Communication: Mobility status;Other (comment) (sats) PT Visit Diagnosis: Difficulty in walking, not elsewhere classified (R26.2);Pain;Unsteadiness on feet (R26.81);Other abnormalities of gait and mobility (R26.89) Pain - Right/Left: Left Pain - part of body:  (ribs)     Time: 9518-8416 PT Time Calculation (min) (ACUTE ONLY): 25 min  Charges:    $Gait Training: 23-37 mins PT General Charges $$ ACUTE PT VISIT: 1 Visit                     Raymond Gurney, PT, DPT Acute Rehabilitation Services  Office: [phone]    Jewel Baize  09/26/2022, 5:22 PM

## 2022-09-26 NOTE — TOC Progression Note (Addendum)
Transition of Care [facility]) - Progression Note    Patient Details  Name: Marc Mcdonald MRN: [id number] Date of Birth: Feb 19, 1956  [facility] CM/SW Contact  Eduard Roux, [place] Phone Number: 09/26/2022, 5:36 PM  Clinical Narrative:     CSW met with patient at bedside. CSW introduced self and explained role. CSW discuss with patient alcohol abuse. Patient reports he drinks 12-16 per day. He acknowledges the need to cut back. He states he been drinking only 3 per day at the hospital, so he believes he can reduced he alcohol intake. CSW offered patient resources -patient declined, substance abuse and mental health resources.   Antony Blackbird, MSW, LCSW Clinical Social Worker     and inquired he if needs any resources. Patient declined substance abuse and mental health resources.   Expected Discharge Plan: Home/Self Care Barriers to Discharge: Continued Medical Work up  Expected Discharge Plan and Services                                               Social Determinants of Health (SDOH) Interventions SDOH Screenings   Food Insecurity: No Food Insecurity (09/15/2022)  Housing: Low Risk  (09/15/2022)  Transportation Needs: No Transportation Needs (09/15/2022)  Utilities: Not At Risk (09/15/2022)  Alcohol Screen: Medium Risk (08/05/2022)  Depression (PHQ2-9): Low Risk  (08/05/2022)  Financial Resource Strain: Low Risk  (08/05/2022)  Physical Activity: Sufficiently Active (08/05/2022)  Social Connections: Socially Integrated (08/05/2022)  Stress: No Stress Concern Present (08/05/2022)  Tobacco Use: Medium Risk (09/15/2022)    Readmission Risk Interventions     No data to display

## 2022-09-26 NOTE — Progress Notes (Signed)
   Trauma/Critical Care Follow Up Note  Subjective:    Overnight Issues:   Objective:  Vital signs for last 24 hours: Temp:  [97.8 F (36.6 C)-99.1 F (37.3 C)] 97.8 F (36.6 C) (08/02 0739) Pulse Rate:  [93-123] 118 (08/02 0739) Resp:  [18-25] 20 (08/02 0739) BP: (116-144)/(74-87) 135/83 (08/02 0739) SpO2:  [96 %-98 %] 96 % (08/02 0739)  Hemodynamic parameters for last 24 hours:    Intake/Output from previous day: 08/01 0701 - 08/02 0700 In: -  Out: 717 [Urine:700; Chest Tube:17]  Intake/Output this shift: No intake/output data recorded.  Vent settings for last 24 hours:    Physical Exam:  Gen: comfortable, no distress Neuro: follows commands, alert, communicative HEENT: PERRL Neck: supple CV: RRR Pulm: unlabored breathing on [place] Abd: soft, NT    GU: urine clear and yellow, +spontaneous voids Extr: wwp, no edema  No results found for this or any previous visit (from the past 24 hour(s)).  Assessment & Plan:  Present on Admission:  Pneumothorax, left    LOS: 11 days   Additional comments:I reviewed the patient's new clinical lab test results.   and I reviewed the patients new imaging test results.    Fall   L PTX with pneumomediastinum and extensive subcutaneous emphysema - s/p pigtail chest tube by EDP 7/22. Continued to have airleak and CT 7/28 showed chest tube in the upper lobe of the lung. Exchanged pigtail for large bore chest tube 7/30. CXR appears stable. No AL today, but small PTX on CXR today. WS and repeat CXR at 1300, monitor for new/worsening SQE.  L rib fxs 10-11 - multimodal pain control and pulm toilet Nondisplaced L iliac crest fx - discussed with ortho, WBAT, no need for f/u Hypokalemia - 3.9, resolved. Check periodically Hypomagnesemia - 1.8, resolved Mild hyponatremia - stable off IVF, 131, salt tabs 1g TID and monitor Alcohol abuse - drinks 8-9 beers daily. CIWA, beer TID. Social work consult for SBIRT HTN - home med COPD - not on  home O2 Former smoker - quit 6 years ago   ID - none VTE - SCDs, lovenox FEN - reg diet Foley - none   Dispo - 4NP. PT/OT - no therapy follow up recommended. Chest tube management as above.  Diamantina Monks, MD Trauma & General Surgery Please use [email or web address] to contact on call provider  09/26/2022  *Care during the described time interval was provided by me. I have reviewed this patient's available data, including medical history, events of note, physical examination and test results as part of my evaluation.

## 2022-09-27 ENCOUNTER — Inpatient Hospital Stay (HOSPITAL_COMMUNITY): Payer: Medicare Other

## 2022-09-27 MED ORDER — DOXYCYCLINE HYCLATE 100 MG PO TABS
100.0000 mg | ORAL_TABLET | Freq: Two times a day (BID) | ORAL | Status: DC
  Administered 2022-09-27 – 2022-09-28 (×3): 100 mg via ORAL
  Filled 2022-09-27 (×3): qty 1

## 2022-09-27 MED ORDER — ORAL CARE MOUTH RINSE: 15.0000 mL | OROMUCOSAL | Status: DC | PRN

## 2022-09-27 MED ORDER — IPRATROPIUM-ALBUTEROL 0.5-2.5 (3) MG/3ML IN SOLN
3.0000 mL | Freq: Two times a day (BID) | RESPIRATORY_TRACT | Status: AC
  Administered 2022-09-27 – 2022-09-28 (×2): 3 mL via RESPIRATORY_TRACT
  Filled 2022-09-27 (×2): qty 3

## 2022-09-27 NOTE — Progress Notes (Signed)
SATURATION QUALIFICATIONS: (This note is used to comply with regulatory documentation for home oxygen)  Patient Saturations on Room Air at Rest = 90%  Patient Saturations on Room Air while Ambulating = 87%  Patient Saturations on 2 Liters of oxygen while Ambulating = 91%  Please briefly explain why patient needs home oxygen: Patient requiring oxygen to maintain oxygen saturations greater than 90% with ambulation.

## 2022-09-27 NOTE — Progress Notes (Cosign Needed Addendum)
[facility] Progress Note     Subjective: CC-  Tolerated chest tube to water seal. No worsening subcutaneous air. Remains on 2L [place].  Objective: Vital signs in last 24 hours: Temp:  [97.7 F (36.5 C)-99.4 F (37.4 C)] 98.4 F (36.9 C) (08/03 0747) Pulse Rate:  [102-114] 114 (08/03 0730) Resp:  [13-24] 24 (08/03 0730) BP: (119-139)/(66-77) 139/75 (08/03 0747) SpO2:  [90 %-97 %] 92 % (08/03 0730) Last BM Date : 09/27/22  Intake/Output from previous day: 08/02 0701 - 08/03 0700 In: 580 [P.O.:580] Out: 1175 [Urine:1175] Intake/Output this shift: Total I/O In: 500 [P.O.:500] Out: -   PE: General appearance: Alert, NAD Resp: clear to auscultation bilaterally, rate and effort normal on 2L [place] Chest wall: sub cut air in chest and face significantly improved, chest tube without air leak, purulent drainage from old chest tube site GI: soft, NT, ND Extremities: MAE Psych: A&Ox3   Lab Results:  No results for input(s): "WBC", "HGB", "HCT", "PLT" in the last 72 hours. BMET No results for input(s): "NA", "K", "CL", "CO2", "GLUCOSE", "BUN", "CREATININE", "CALCIUM" in the last 72 hours. PT/INR No results for input(s): "LABPROT", "INR" in the last 72 hours. CMP     Component Value Date/Time   NA 131 (L) 09/24/2022 0234   K 3.9 09/24/2022 0234   CL 96 (L) 09/24/2022 0234   CO2 25 09/24/2022 0234   GLUCOSE 90 09/24/2022 0234   BUN <5 (L) 09/24/2022 0234   CREATININE 0.45 (L) 09/24/2022 0234   CREATININE 0.66 06/03/2021 0759   CALCIUM 8.2 (L) 09/24/2022 0234   PROT 7.1 09/15/2022 1853   ALBUMIN 3.0 (L) 09/15/2022 1853   AST 44 (H) 09/15/2022 1853   AST 25 06/03/2021 0759   ALT 23 09/15/2022 1853   ALT 11 06/03/2021 0759   ALKPHOS 121 09/15/2022 1853   BILITOT 0.7 09/15/2022 1853   BILITOT 0.8 06/03/2021 0759   GFRNONAA >60 09/24/2022 0234   GFRNONAA >60 06/03/2021 0759   GFRAA >60 09/09/2019 0207   Lipase  No results found for:  "LIPASE"     Studies/Results: DG CHEST PORT 1 VIEW  Result Date: 09/27/2022 CLINICAL DATA:  Chest tube EXAM: PORTABLE CHEST 1 VIEW COMPARISON:  09/26/2022 FINDINGS: No significant change in a small left apical pneumothorax, less than 10% in volume, with left-sided chest tube remaining in position. Very extensive subcutaneous emphysema about the chest and neck. Heart and mediastinum are normal. No new airspace opacity. IMPRESSION: 1. No significant change in a small left apical pneumothorax, less than 10% in volume, with left-sided chest tube remaining in position. 2. Very extensive subcutaneous emphysema about the chest and neck. Electronically Signed   By: Jearld Lesch M.D.   On: 09/27/2022 08:57   DG Chest Port 1 View  Result Date: 09/26/2022 CLINICAL DATA:  Left pneumothorax EXAM: PORTABLE CHEST 1 VIEW COMPARISON:  Previous studies including the examination done earlier today FINDINGS: There is interval decrease in size of small left apical pneumothorax. Left chest tube is seen with its tip in the lateral aspect of left apical region. There are linear densities in both lower lung fields suggesting subsegmental atelectasis with interval worsening. There are no signs of alveolar pulmonary edema. There is no focal pulmonary consolidation. Bilateral CP angles are clear. Extensive subcutaneous emphysema is seen in chest wall and neck on both sides. IMPRESSION: There is interval decrease in size of small left apical pneumothorax. Left chest tube is noted in place. There are linear densities in  both lower lung fields suggesting subsegmental atelectasis with interval worsening. There are no signs of alveolar pulmonary edema or new focal pulmonary consolidation. Extensive subcutaneous emphysema is seen in chest wall and neck on both sides. Electronically Signed   By: Ernie Avena M.D.   On: 09/26/2022 16:52   DG Chest Port 1 View  Result Date: 09/26/2022 CLINICAL DATA:  Left-sided chest tube EXAM:  PORTABLE CHEST 1 VIEW COMPARISON:  09/24/2022 FINDINGS: Unchanged small left apical pneumothorax, less than 10% in volume, with left-sided chest tube in position. Right lung is normally aerated. Extensive subcutaneous emphysema about the chest. Heart and mediastinum are normal. IMPRESSION: 1. Unchanged small left apical pneumothorax, less than 10% in volume, with left-sided chest tube in position. 2. Extensive subcutaneous emphysema about the chest. Electronically Signed   By: Jearld Lesch M.D.   On: 09/26/2022 09:25    Anti-infectives: Anti-infectives (From admission, onward)    Start     Dose/Rate Route Frequency Ordered Stop   09/27/22 1215  doxycycline (VIBRA-TABS) tablet 100 mg        100 mg Oral Every 12 hours 09/27/22 1123          Assessment/Plan Fall   L PTX with pneumomediastinum and extensive subcutaneous emphysema - s/p pigtail chest tube by EDP 7/22. Continued to have airleak and CT 7/28 showed chest tube in the upper lobe of the lung. Exchanged pigtail for large bore chest tube 7/30. CXR stable and no worsening subcutaneous air after 24 hours on water seal. Remove Chest tube this morning and repeat CXR this afternoon 1500  L rib fxs 10-11 - multimodal pain control and pulm toilet Nondisplaced L iliac crest fx - discussed with ortho, WBAT, no need for f/u Hypokalemia - resolved. Check periodically Hypomagnesemia - resolved Mild hyponatremia - stable off IVF, 131 (7/31), salt tabs 1g TID and monitor Alcohol abuse - drinks 8-9 beers daily. CIWA, beer TID. Social work consult for SBIRT HTN - home med COPD - not on home O2 prior to admission Former smoker - quit 6 years ago   ID - start doxy for local infection at chest tube site VTE - SCDs, lovenox FEN - reg diet Foley - none   Dispo - 4NP. PT/OT - no therapy follow up recommended. Chest tube removed, repeat film at 1500. Possible discharge tomorrow. Wean supplemental oxygen but I suspect he may need to go home with oxygen,  home O2 ordered and TOC consult placed.  I reviewed last 24 h vitals and pain scores, last 48 h intake and output, last 24 h labs and trends, and last 24 h imaging results.    LOS: 12 days    Franne Forts, [facility] Surgery 09/27/2022, 11:24 AM Please see Amion for pager number during day hours 7:00am-4:30pm

## 2022-09-28 MED ORDER — GUAIFENESIN 200 MG PO TABS: 200.0000 mg | ORAL_TABLET | Freq: Four times a day (QID) | ORAL | Status: DC | PRN

## 2022-09-28 MED ORDER — SODIUM CHLORIDE 1 G PO TABS: 1.0000 g | ORAL_TABLET | Freq: Three times a day (TID) | ORAL | 0 refills | Status: AC

## 2022-09-28 MED ORDER — OXYCODONE HCL 10 MG PO TABS: 5.0000 mg | ORAL_TABLET | Freq: Four times a day (QID) | ORAL | 0 refills | Status: DC | PRN

## 2022-09-28 MED ORDER — POLYETHYLENE GLYCOL 3350 17 G PO PACK: 17.0000 g | PACK | Freq: Every day | ORAL | Status: AC | PRN

## 2022-09-28 MED ORDER — DOXYCYCLINE HYCLATE 100 MG PO TABS: 100.0000 mg | ORAL_TABLET | Freq: Two times a day (BID) | ORAL | 0 refills | Status: AC

## 2022-09-28 MED ORDER — ADULT MULTIVITAMIN W/MINERALS CH: 1.0000 | ORAL_TABLET | Freq: Every day | ORAL | Status: AC

## 2022-09-28 MED ORDER — METHOCARBAMOL 500 MG PO TABS: 500.0000 mg | ORAL_TABLET | Freq: Three times a day (TID) | ORAL | 1 refills | Status: AC | PRN

## 2022-09-28 NOTE — Progress Notes (Signed)
RNCM received order for home Oxygen-patient with no preference-[employer] contacted with order and confirmation received.

## 2022-09-28 NOTE — Discharge Summary (Signed)
[facility] Discharge Summary   Patient ID: Marc Mcdonald MRN: [id number] DOB/AGE: May 18, 1955 67 y.o.  Admit date: 09/15/2022 Discharge date: 09/28/2022  Discharge Diagnosis: Fall L PTX with pneumomediastinum and extensive subcutaneous emphysema  L rib fxs 10-11  Nondisplaced L iliac crest fx Hyperkalemia Hypomagnesemia Hyponatremia Alcohol abuse  HTN  COPD  Former smoker  Hypoxia  Consultants None  Imaging: DG CHEST PORT 1 VIEW  Result Date: 09/27/2022 CLINICAL DATA:  Encounter for chest tube removal EXAM: PORTABLE CHEST 1 VIEW COMPARISON:  CXR 09/27/22 FINDINGS: Interval removal of the left-sided thoracostomy tube. There is persistent extensive bilateral subcutaneous emphysema. Unchanged hazy opacity at the left lung base, which could represent atelectasis or infection. No radiographically apparent pneumothorax. Visualized upper abdomen is unremarkable. There is likely at least small volume pneumomediastinum, which is likely unchanged from prior exam. IMPRESSION: 1. Interval removal of the left-sided thoracostomy tube. No radiographically apparent pneumothorax. 2. Unchanged hazy opacity at the left lung base, which could represent atelectasis or infection. 3. Persistent extensive bilateral subcutaneous emphysema and likely small volume pneumomediastinum. Electronically Signed   By: Lorenza Cambridge M.D.   On: 09/27/2022 16:32   DG CHEST PORT 1 VIEW  Result Date: 09/27/2022 CLINICAL DATA:  Chest tube EXAM: PORTABLE CHEST 1 VIEW COMPARISON:  09/26/2022 FINDINGS: No significant change in a small left apical pneumothorax, less than 10% in volume, with left-sided chest tube remaining in position. Very extensive subcutaneous emphysema about the chest and neck. Heart and mediastinum are normal. No new airspace opacity. IMPRESSION: 1. No significant change in a small left apical pneumothorax, less than 10% in volume, with left-sided chest tube remaining in position. 2. Very extensive  subcutaneous emphysema about the chest and neck. Electronically Signed   By: Jearld Lesch M.D.   On: 09/27/2022 08:57   DG Chest Port 1 View  Result Date: 09/26/2022 CLINICAL DATA:  Left pneumothorax EXAM: PORTABLE CHEST 1 VIEW COMPARISON:  Previous studies including the examination done earlier today FINDINGS: There is interval decrease in size of small left apical pneumothorax. Left chest tube is seen with its tip in the lateral aspect of left apical region. There are linear densities in both lower lung fields suggesting subsegmental atelectasis with interval worsening. There are no signs of alveolar pulmonary edema. There is no focal pulmonary consolidation. Bilateral CP angles are clear. Extensive subcutaneous emphysema is seen in chest wall and neck on both sides. IMPRESSION: There is interval decrease in size of small left apical pneumothorax. Left chest tube is noted in place. There are linear densities in both lower lung fields suggesting subsegmental atelectasis with interval worsening. There are no signs of alveolar pulmonary edema or new focal pulmonary consolidation. Extensive subcutaneous emphysema is seen in chest wall and neck on both sides. Electronically Signed   By: Ernie Avena M.D.   On: 09/26/2022 16:52    Procedures Dr. Donnald Garre (09/15/2022) - Left chest tube placement Dr. Bedelia Person (09/22/2022) - tube thoracostomy--left   HPI:  Marc Mcdonald is a 67 y.o. male male PMH HTN, alcohol abuse, COPD, duodenal ulcer who presented to the ED today after suffering a ground level fall at home. 09/15/22 States that he tripped and landed on a coffee table, striking his left chest. Denies hitting his head or any LOC.  He developed swelling in his face, arm, and abdomen/back. Worked up by EDP and found to have multiple left rib fractures with a pneumothorax. Chest tube placed by EDP. Trauma asked to see for  admission.  Hospital Course: Patient was admitted for pain control and observation.  He continued to have an air leak from the chest tube so CT scan was repeated 7/28 and showed the chest tube to be in the upper lobe of the lung. Pigtail was exchanged for a large bore chest tube on 7/30. Pneumothorax was monitored with serial chest xrays and resolved. Air leak resolved. Chest tube successfully removed 8/3 with stable follow up chest xray. He required a stable amount of supplemental oxygen (2L via nasal canula) this admission, likely for acute on chronic reasons, and he was set up to go home with oxygen. Patient also suffered a nondisplaced left iliac crest fracture which was discussed with orthopedics and was recommended nonop management, WBAT. On 8/4 the patient was felt stable for discharge home.  Patient will follow up as below and knows to call with questions or concerns.    I have personally reviewed the patients medication history on the [place] controlled substance database.    Physical Exam: General appearance: Alert, NAD Resp: clear to auscultation bilaterally, rate and effort normal on 2L  Chest wall: sub cut air in chest and face continues to improve, cdi dressing to removed chest tube site/ much less tender today GI: soft, NT, ND Extremities: MAE Psych: A&Ox3    Allergies as of 09/28/2022   No Known Allergies      Medication List     TAKE these medications    acetaminophen 500 MG tablet Commonly known as: TYLENOL Take 1,000 mg by mouth every 6 (six) hours as needed for mild pain or moderate pain.   amLODipine 5 MG tablet Commonly known as: NORVASC Take 1 tablet (5 mg total) by mouth daily.   childrens multivitamin chewable tablet Chew 1 tablet by mouth daily. With iron   doxycycline 100 MG tablet Commonly known as: VIBRA-TABS Take 1 tablet (100 mg total) by mouth every 12 (twelve) hours for 6 days.   guaiFENesin 200 MG tablet Take 1 tablet (200 mg total) by mouth every 6 (six) hours as needed for cough or to loosen phlegm.   methocarbamol 500 MG  tablet Commonly known as: ROBAXIN Take 1-2 tablets (500-1,000 mg total) by mouth every 8 (eight) hours as needed for muscle spasms.   multivitamin with minerals Tabs tablet Take 1 tablet by mouth daily.   Oxycodone HCl 10 MG Tabs Take 0.5-1 tablets (5-10 mg total) by mouth every 6 (six) hours as needed for moderate pain or severe pain (5mg  moderate, 10mg  severe).   pantoprazole 40 MG tablet Commonly known as: PROTONIX TAKE 1 TABLET BY MOUTH EVERY DAY   polyethylene glycol 17 g packet Commonly known as: MIRALAX / GLYCOLAX Take 17 g by mouth daily as needed (constipation).   sodium chloride 1 g tablet Take 1 tablet (1 g total) by mouth 3 (three) times daily with meals.   traZODone 50 MG tablet Commonly known as: DESYREL TAKE 1 TABLET BY MOUTH EVERY DAY AT BEDTIME AS NEEDED FOR SLEEP What changed: See the new instructions.               Durable Medical Equipment  (From admission, onward)           Start     Ordered   09/27/22 1123  For home use only DME oxygen  Once       Question Answer Comment  Length of Need 6 Months   Mode or (Route) Nasal cannula   Liters per Minute 2  Frequency Continuous (stationary and portable oxygen unit needed)   Oxygen delivery system Gas      09/27/22 1123              Follow-up Information     [place], Buren Kos, MD. Schedule an appointment as soon as possible for a visit.   Specialty: Family Medicine Why: Post-hospitalization follow up appointment with your primary care physician. follow up regarding rib fractures Contact information: [address] [place] [address] [phone]         [facility]. Call.   Why: I will call you after your chest xray with results, you do not have to make an appointment Contact information: [address] [place] [address] [phone]                 Signed: Franne Forts, [facility]  Surgery 09/28/2022, 10:30 AM Please see Amion for pager number during day hours 7:00am-4:30pm

## 2022-10-07 ENCOUNTER — Encounter (HOSPITAL_BASED_OUTPATIENT_CLINIC_OR_DEPARTMENT_OTHER): Payer: Self-pay | Admitting: Family Medicine

## 2022-10-07 ENCOUNTER — Ambulatory Visit
Admission: RE | Admit: 2022-10-07 | Discharge: 2022-10-07 | Disposition: A | Discharge status: Home / Self Care | Payer: Medicare Other | Source: Ambulatory Visit | Attending: Physician Assistant | Admitting: Physician Assistant

## 2022-10-07 ENCOUNTER — Ambulatory Visit (INDEPENDENT_AMBULATORY_CARE_PROVIDER_SITE_OTHER): Payer: Medicare Other | Admitting: Family Medicine

## 2022-10-07 VITALS — BP 122/69 | HR 100 | Ht 68.0 in | Wt 124.7 lb

## 2022-10-07 DIAGNOSIS — R0789 Other chest pain: Secondary | ICD-10-CM | POA: Diagnosis not present

## 2022-10-07 DIAGNOSIS — J939 Pneumothorax, unspecified: Secondary | ICD-10-CM

## 2022-10-07 DIAGNOSIS — J984 Other disorders of lung: Secondary | ICD-10-CM | POA: Diagnosis not present

## 2022-10-07 NOTE — Assessment & Plan Note (Signed)
Patient had recent hospitalization following ground-level fall at home.  He sustained left-sided injuries including nondisplaced left iliac crest fracture, left sided rib fractures with associated pneumothorax.  Orthopedics was consulted related to iliac crest fracture, recommended nonoperative management, weightbearing as tolerated. He was admitted to trauma team related to left rib fracture and associated pneumothorax.  He had chest tube in place which had to be swapped in order to improve resolution.  Before discharge, pneumothorax that resolved, chest tube was removed.  He was discharged on oxygen.  He was recommended to have follow-up chest x-ray about 1 week from discharge, has not completed this yet.  He presents today with family utilizing portable oxygen.  Reports difficulty with portable oxygen as it is a small tank and is difficult for him to carry/travel with.  He reports that there is still a stitch in place on his left chest wall near location of chest tube placement.  Continues to have some small amount of drainage from opening where chest tube was.  He feels that his breathing status is baseline. Related to fall, patient reports that his foot got caught and he tripped and fell onto his coffee table at home.  Denies any significant alcohol use.  Did not have any preceding symptoms such as lightheadedness or dizziness. On exam today, patient is in no acute distress, vital signs stable, heart rate is borderline tachycardic, blood pressure is appropriate.  Along left chest wall mid axillary line, small opening is appreciated with appropriate granulation tissue, very mild erythema superficially, singular suture in place laterally from opening.  No obvious pus or drainage at this time. Suture removed in office today, patient tolerated well, no bleeding or drainage otherwise.  Recommend continuing with conservative measures, keep the area clean and dry, recommend warm soapy water on the area, continue  utilizing gauze for any small amount of drainage that does occur.  Advised on signs of infection to be mindful of, returning to office if these do occur Recommend that he proceed with chest x-ray as ordered by trauma surgeon, to be done at [facility] imaging.  He indicates that he will do this today. Patient has appointment scheduled next month, we will continue with this and follow-up on current issue as well as chronic issues

## 2022-10-07 NOTE — Progress Notes (Signed)
    Procedures performed today:    None.  Independent interpretation of notes and tests performed by another provider:   None.  Brief History, Exam, Impression, and Recommendations:    BP 122/69   Pulse 100   Ht 5\' 8"  (1.727 m)   Wt 124 lb 11.2 oz (56.6 kg)   SpO2 95%   BMI 18.96 kg/m   Left-sided chest wall pain Assessment & Plan: Patient had recent hospitalization following ground-level fall at home.  He sustained left-sided injuries including nondisplaced left iliac crest fracture, left sided rib fractures with associated pneumothorax.  Orthopedics was consulted related to iliac crest fracture, recommended nonoperative management, weightbearing as tolerated. He was admitted to trauma team related to left rib fracture and associated pneumothorax.  He had chest tube in place which had to be swapped in order to improve resolution.  Before discharge, pneumothorax that resolved, chest tube was removed.  He was discharged on oxygen.  He was recommended to have follow-up chest x-ray about 1 week from discharge, has not completed this yet.  He presents today with family utilizing portable oxygen.  Reports difficulty with portable oxygen as it is a small tank and is difficult for him to carry/travel with.  He reports that there is still a stitch in place on his left chest wall near location of chest tube placement.  Continues to have some small amount of drainage from opening where chest tube was.  He feels that his breathing status is baseline. Related to fall, patient reports that his foot got caught and he tripped and fell onto his coffee table at home.  Denies any significant alcohol use.  Did not have any preceding symptoms such as lightheadedness or dizziness. On exam today, patient is in no acute distress, vital signs stable, heart rate is borderline tachycardic, blood pressure is appropriate.  Along left chest wall mid axillary line, small opening is appreciated with appropriate  granulation tissue, very mild erythema superficially, singular suture in place laterally from opening.  No obvious pus or drainage at this time. Suture removed in office today, patient tolerated well, no bleeding or drainage otherwise.  Recommend continuing with conservative measures, keep the area clean and dry, recommend warm soapy water on the area, continue utilizing gauze for any small amount of drainage that does occur.  Advised on signs of infection to be mindful of, returning to office if these do occur Recommend that he proceed with chest x-ray as ordered by trauma surgeon, to be done at [facility] imaging.  He indicates that he will do this today. Patient has appointment scheduled next month, we will continue with this and follow-up on current issue as well as chronic issues   Return if symptoms worsen or fail to improve.  Spent 32 minutes on this patient encounter, including preparation, chart review, face-to-face counseling with patient and coordination of care, and documentation of encounter   ___________________________________________  [place], MD, ABFM, [facility] Primary Care and Sports Medicine [facility]

## 2022-10-14 ENCOUNTER — Encounter: Payer: Self-pay | Admitting: Physician Assistant

## 2022-11-14 ENCOUNTER — Ambulatory Visit (HOSPITAL_BASED_OUTPATIENT_CLINIC_OR_DEPARTMENT_OTHER): Payer: Medicare Other | Admitting: Family Medicine

## 2022-11-14 ENCOUNTER — Encounter (HOSPITAL_BASED_OUTPATIENT_CLINIC_OR_DEPARTMENT_OTHER): Payer: Self-pay | Admitting: Family Medicine

## 2022-11-14 VITALS — BP 128/70 | HR 109 | Ht 68.0 in | Wt 127.7 lb

## 2022-11-14 DIAGNOSIS — R0602 Shortness of breath: Secondary | ICD-10-CM

## 2022-11-14 DIAGNOSIS — I10 Essential (primary) hypertension: Secondary | ICD-10-CM | POA: Diagnosis not present

## 2022-11-14 NOTE — Assessment & Plan Note (Signed)
Blood pressure at goal in office today.  Patient does not check blood pressure regularly at home.  Continues with medications as prescribed.  Pulse is slightly tachycardic, has been this way at prior office visits as well.  Denies any current issues with chest pain, headaches, lightheadedness or dizziness. At this time we will continue with current medication regimen.  Recommend intermittent monitoring of blood pressure at home, DASH diet.  Encouraged to maintain adequate hydration, recommend alcohol cessation. Plan for follow-up in about 3 to 4 months for continued monitoring or sooner as needed

## 2022-11-14 NOTE — Assessment & Plan Note (Signed)
Breathing has improved gradually since recent hospitalization for pneumothorax.  He was discharged with supplemental oxygen to utilize at home, however tank is empty at this point and he has been primarily on room air.  Generally, he does feel that he is at baseline.  [place] ballpark [place] and had to walk about 1 mile and did have some shortness of breath during this activity.  He does have a long history of smoking and I suspect that there is underlying COPD.  No current issues with cough.  Denies any chest pain. On exam, patient is in no acute distress, vital signs stable.  Lungs clear to auscultation bilaterally, distant breath sounds. Discussed considerations, we we will proceed with referral to pulmonology for further evaluation and testing.  Suspect underlying COPD given patient history.

## 2022-11-14 NOTE — Progress Notes (Signed)
    Procedures performed today:    None.  Independent interpretation of notes and tests performed by another provider:   None.  Brief History, Exam, Impression, and Recommendations:    BP 128/70 (BP Location: Left Arm, Patient Position: Sitting, Cuff Size: Normal)   Pulse (!) 109   Ht 5\' 8"  (1.727 m)   Wt 127 lb 11.2 oz (57.9 kg)   SpO2 97%   BMI 19.42 kg/m   Primary hypertension Assessment & Plan: Blood pressure at goal in office today.  Patient does not check blood pressure regularly at home.  Continues with medications as prescribed.  Pulse is slightly tachycardic, has been this way at prior office visits as well.  Denies any current issues with chest pain, headaches, lightheadedness or dizziness. At this time we will continue with current medication regimen.  Recommend intermittent monitoring of blood pressure at home, DASH diet.  Encouraged to maintain adequate hydration, recommend alcohol cessation. Plan for follow-up in about 3 to 4 months for continued monitoring or sooner as needed   Shortness of breath Assessment & Plan: Breathing has improved gradually since recent hospitalization for pneumothorax.  He was discharged with supplemental oxygen to utilize at home, however tank is empty at this point and he has been primarily on room air.  Generally, he does feel that he is at baseline.  He [place] and had to walk about 1 mile and did have some shortness of breath during this activity.  He does have a long history of smoking and I suspect that there is underlying COPD.  No current issues with cough.  Denies any chest pain. On exam, patient is in no acute distress, vital signs stable.  Lungs clear to auscultation bilaterally, distant breath sounds. Discussed considerations, we we will proceed with referral to pulmonology for further evaluation and testing.  Suspect underlying COPD given patient history.  Orders: -     Ambulatory referral to  Pulmonology  Return in about 3 months (around 02/13/2023).   ___________________________________________ Esthefany Herrig de [place], MD, ABFM, [facility] Primary Care and Sports Medicine [facility]

## 2022-11-18 ENCOUNTER — Encounter (HOSPITAL_BASED_OUTPATIENT_CLINIC_OR_DEPARTMENT_OTHER): Payer: Self-pay

## 2023-01-06 ENCOUNTER — Other Ambulatory Visit (HOSPITAL_BASED_OUTPATIENT_CLINIC_OR_DEPARTMENT_OTHER): Payer: Self-pay | Admitting: Family Medicine

## 2023-01-06 DIAGNOSIS — G4700 Insomnia, unspecified: Secondary | ICD-10-CM

## 2023-02-13 ENCOUNTER — Ambulatory Visit (INDEPENDENT_AMBULATORY_CARE_PROVIDER_SITE_OTHER): Payer: Medicare Other | Admitting: Family Medicine

## 2023-02-13 ENCOUNTER — Encounter (HOSPITAL_BASED_OUTPATIENT_CLINIC_OR_DEPARTMENT_OTHER): Payer: Self-pay | Admitting: Family Medicine

## 2023-02-13 VITALS — BP 118/79 | HR 106 | Ht 68.0 in | Wt 123.6 lb

## 2023-02-13 DIAGNOSIS — R0602 Shortness of breath: Secondary | ICD-10-CM | POA: Diagnosis not present

## 2023-02-13 DIAGNOSIS — I10 Essential (primary) hypertension: Secondary | ICD-10-CM

## 2023-02-13 NOTE — Assessment & Plan Note (Signed)
Blood pressure at goal in office today.  Patient does not check blood pressure regularly at home.  Continues with medications as prescribed.  Pulse is slightly tachycardic, has been this way at prior office visits as well.  Denies any current issues with chest pain, headaches, lightheadedness or dizziness. At this time we will continue with current medication regimen.  Recommend intermittent monitoring of blood pressure at home, DASH diet.  Encouraged to maintain adequate hydration, recommend alcohol cessation. Plan for follow-up in about 3 to 4 months for continued monitoring or sooner as needed

## 2023-02-13 NOTE — Assessment & Plan Note (Signed)
Doing well presently. No longer utilizing oxygen. Did not hear from pulmonology office after placing referral at last appointment.  Does appear that referral was closed, referred to office close today after not hearing from patient, although no indication that they reached out to patient to schedule establishing visit.  Patient provided with contact information in office today and referral reopened for patient to schedule

## 2023-02-13 NOTE — Patient Instructions (Signed)
?  Medication Instructions:  ?Your physician recommends that you continue on your current medications as directed. Please refer to the Current Medication list given to you today. ?--If you need a refill on any your medications before your next appointment, please call your pharmacy first. If no refills are authorized on file call the office.-- ? ? ? ?Follow-Up: ?Your next appointment:   ?Your physician recommends that you schedule a follow-up appointment in: 3 month follow-up with Dr. [place] ? ?You will receive a text message or e-mail with a link to a survey about your care and experience with [place] today! We would greatly appreciate your feedback!  ? ?Thanks for letting us be apart of your health journey!!  ?Primary Care and Sports Medicine  ? ?Dr. Kyung Rudd [place]  ? ?We encourage you to activate your patient portal called "MyChart".  Sign up information is provided on this After Visit Summary.  MyChart is used to connect with patients for Virtual Visits (Telemedicine).  Patients are able to view lab/test results, encounter notes, upcoming appointments, etc.  Non-urgent messages can be sent to your provider as well. To learn more about what you can do with MyChart, please visit --  [email or web address].    ?

## 2023-02-13 NOTE — Progress Notes (Signed)
    Procedures performed today:    None.  Independent interpretation of notes and tests performed by another provider:   None.  Brief History, Exam, Impression, and Recommendations:    BP (!) 125/91 (BP Location: Right Arm, Patient Position: Sitting, Cuff Size: Normal)   Pulse (!) 106   Ht 5\' 8"  (1.727 m)   Wt 123 lb 9.6 oz (56.1 kg)   SpO2 96%   BMI 18.79 kg/m   Primary hypertension Assessment & Plan: Blood pressure at goal in office today.  Patient does not check blood pressure regularly at home.  Continues with medications as prescribed.  Pulse is slightly tachycardic, has been this way at prior office visits as well.  Denies any current issues with chest pain, headaches, lightheadedness or dizziness. At this time we will continue with current medication regimen.  Recommend intermittent monitoring of blood pressure at home, DASH diet.  Encouraged to maintain adequate hydration, recommend alcohol cessation. Plan for follow-up in about 3 to 4 months for continued monitoring or sooner as needed   Shortness of breath Assessment & Plan: Doing well presently. No longer utilizing oxygen. Did not hear from pulmonology office after placing referral at last appointment.  Does appear that referral was closed, referred to office close today after not hearing from patient, although no indication that they reached out to patient to schedule establishing visit.  Patient provided with contact information in office today and referral reopened for patient to schedule   Return in about 3 months (around 05/14/2023) for hypertension.   ___________________________________________ Amilah Greenspan [place], MD, ABFM, CAQSM Primary Care and Sports Medicine [facility]

## 2023-03-28 ENCOUNTER — Other Ambulatory Visit (HOSPITAL_BASED_OUTPATIENT_CLINIC_OR_DEPARTMENT_OTHER): Payer: Self-pay | Admitting: Family Medicine

## 2023-04-05 ENCOUNTER — Other Ambulatory Visit (HOSPITAL_BASED_OUTPATIENT_CLINIC_OR_DEPARTMENT_OTHER): Payer: Self-pay | Admitting: Family Medicine

## 2023-04-05 DIAGNOSIS — G4700 Insomnia, unspecified: Secondary | ICD-10-CM

## 2023-04-16 ENCOUNTER — Institutional Professional Consult (permissible substitution) (HOSPITAL_BASED_OUTPATIENT_CLINIC_OR_DEPARTMENT_OTHER): Payer: Medicare Other | Admitting: Pulmonary Disease

## 2023-05-18 ENCOUNTER — Ambulatory Visit (HOSPITAL_BASED_OUTPATIENT_CLINIC_OR_DEPARTMENT_OTHER): Payer: Medicare Other | Admitting: Family Medicine

## 2023-05-28 ENCOUNTER — Encounter (HOSPITAL_BASED_OUTPATIENT_CLINIC_OR_DEPARTMENT_OTHER): Payer: Self-pay | Admitting: *Deleted

## 2023-05-29 ENCOUNTER — Ambulatory Visit (HOSPITAL_BASED_OUTPATIENT_CLINIC_OR_DEPARTMENT_OTHER): Payer: Medicare Other | Admitting: Family Medicine

## 2023-06-02 ENCOUNTER — Ambulatory Visit (HOSPITAL_BASED_OUTPATIENT_CLINIC_OR_DEPARTMENT_OTHER): Admitting: Family Medicine

## 2023-07-10 ENCOUNTER — Ambulatory Visit (HOSPITAL_BASED_OUTPATIENT_CLINIC_OR_DEPARTMENT_OTHER): Admitting: Family Medicine

## 2023-08-11 ENCOUNTER — Encounter (HOSPITAL_BASED_OUTPATIENT_CLINIC_OR_DEPARTMENT_OTHER): Payer: Medicare Other

## 2023-08-21 ENCOUNTER — Ambulatory Visit (HOSPITAL_BASED_OUTPATIENT_CLINIC_OR_DEPARTMENT_OTHER): Admitting: Family Medicine

## 2023-09-16 ENCOUNTER — Other Ambulatory Visit (HOSPITAL_BASED_OUTPATIENT_CLINIC_OR_DEPARTMENT_OTHER): Payer: Self-pay | Admitting: Family Medicine

## 2023-09-22 ENCOUNTER — Ambulatory Visit (HOSPITAL_BASED_OUTPATIENT_CLINIC_OR_DEPARTMENT_OTHER): Admitting: *Deleted

## 2023-09-22 ENCOUNTER — Encounter (HOSPITAL_BASED_OUTPATIENT_CLINIC_OR_DEPARTMENT_OTHER): Payer: Self-pay

## 2023-09-22 DIAGNOSIS — Z0000 Encounter for general adult medical examination without abnormal findings: Secondary | ICD-10-CM

## 2023-09-22 NOTE — Progress Notes (Signed)
 Subjective:   Marc Mcdonald is a 68 y.o. male who presents for Medicare Annual/Subsequent preventive examination.  Visit Complete: Virtual I connected with  Marc Mcdonald on 09/22/23 by a audio enabled telemedicine application and verified that I am speaking with the correct person using two identifiers.  Patient Location: Home  Provider Location: Home Office  I discussed the limitations of evaluation and management by telemedicine. The patient expressed understanding and agreed to proceed.  Vital Signs: Because this visit was a virtual/telehealth visit, some criteria may be missing or patient reported. Any vitals not documented were not able to be obtained and vitals that have been documented are patient reported.   Cardiac Risk Factors include: advanced age (>39men, >76 women);male gender;hypertension     Objective:    There were no vitals filed for this visit. There is no height or weight on file to calculate BMI.     09/22/2023    9:37 AM 09/15/2022   12:33 PM 08/05/2022   11:33 AM 07/11/2022    8:13 AM 09/20/2021    8:06 PM 08/16/2021   11:49 AM 07/15/2021    1:05 PM  Advanced Directives  Does Patient Have a Medical Advance Directive? No No No No No No No  Would patient like information on creating a medical advance directive? No - Patient declined No - Patient declined No - Patient declined   No - Patient declined No - Patient declined    Current Medications (verified) Outpatient Encounter Medications as of 09/22/2023  Medication Sig   acetaminophen  (TYLENOL ) 500 MG tablet Take 1,000 mg by mouth every 6 (six) hours as needed for mild pain or moderate pain.   amLODipine  (NORVASC ) 5 MG tablet TAKE 1 TABLET (5 MG TOTAL) BY MOUTH DAILY.   guaiFENesin  200 MG tablet Take 1 tablet (200 mg total) by mouth every 6 (six) hours as needed for cough or to loosen phlegm.   methocarbamol  (ROBAXIN ) 500 MG tablet Take 1-2 tablets (500-1,000 mg total) by mouth every 8 (eight) hours as  needed for muscle spasms.   Multiple Vitamin (MULTIVITAMIN WITH MINERALS) TABS tablet Take 1 tablet by mouth daily.   oxyCODONE  10 MG TABS Take 0.5-1 tablets (5-10 mg total) by mouth every 6 (six) hours as needed for moderate pain or severe pain (5mg  moderate, 10mg  severe).   pantoprazole  (PROTONIX ) 40 MG tablet TAKE 1 TABLET BY MOUTH EVERY DAY   Pediatric Multiple Vitamins (CHILDRENS MULTIVITAMIN) chewable tablet Chew 1 tablet by mouth daily. With iron    polyethylene glycol (MIRALAX  / GLYCOLAX ) 17 g packet Take 17 g by mouth daily as needed (constipation).   sodium chloride  1 g tablet Take 1 tablet (1 g total) by mouth 3 (three) times daily with meals.   traZODone  (DESYREL ) 50 MG tablet TAKE 1 TABLET BY MOUTH AT BEDTIME AS NEEDED FOR SLEEP   No facility-administered encounter medications on file as of 09/22/2023.    Allergies (verified) Patient has no known allergies.   History: Past Medical History:  Diagnosis Date   Arthritis    Colon cancer (HCC)    COPD (chronic obstructive pulmonary disease) (HCC)    Duodenal ulcer    Hypertension    Past Surgical History:  Procedure Laterality Date   BALLOON DILATION N/A 05/26/2020   Procedure: BALLOON DILATION;  Surgeon: San Sandor GAILS, DO;  Location: [facility];  Service: Endoscopy;  Laterality: N/A;   BIOPSY  05/26/2020   Procedure: BIOPSY;  Surgeon: San Sandor GAILS, DO;  Location:  [facility];  Service: Endoscopy;;   COLONOSCOPY WITH PROPOFOL  N/A 05/26/2020   Procedure: COLONOSCOPY WITH PROPOFOL ;  Surgeon: San Sandor GAILS, DO;  Location: [facility];  Service: Endoscopy;  Laterality: N/A;   COLOSTOMY N/A 05/30/2020   Procedure: COLOSTOMY;  Surgeon: Teresa Lonni HERO, MD;  Location: [facility];  Service: General;  Laterality: N/A;   ESOPHAGOGASTRODUODENOSCOPY (EGD) WITH PROPOFOL  N/A 05/26/2020   Procedure: ESOPHAGOGASTRODUODENOSCOPY (EGD) WITH PROPOFOL ;  Surgeon: San Sandor GAILS, DO;  Location: [facility];  Service:  Endoscopy;  Laterality: N/A;   FLEXIBLE SIGMOIDOSCOPY N/A 02/13/2021   Procedure: FLEXIBLE SIGMOIDOSCOPY;  Surgeon: Teresa Lonni HERO, MD;  Location: [facility];  Service: General;  Laterality: N/A;   HERNIA REPAIR     PARASTOMAL HERNIA REPAIR N/A 02/13/2021   Procedure: PRIMARY REPAIR CLOSURE OF PARASTOMAL HERNIA, INTRAOPERATIVE ASSESSMENT OF PERFUSION;  Surgeon: Teresa Lonni HERO, MD;  Location: [facility];  Service: General;  Laterality: N/A;   SUBMUCOSAL TATTOO INJECTION  05/26/2020   Procedure: SUBMUCOSAL TATTOO INJECTION;  Surgeon: San Sandor GAILS, DO;  Location: [facility];  Service: Endoscopy;;   XI ROBOTIC ASSISTED COLOSTOMY TAKEDOWN N/A 02/13/2021   Procedure: XI ROBOTIC ASSISTED COLOSTOMY TAKEDOWN, TAP BLOCK;  Surgeon: Teresa Lonni HERO, MD;  Location: [facility];  Service: General;  Laterality: N/A;   XI ROBOTIC ASSISTED LOWER ANTERIOR RESECTION N/A 05/30/2020   Procedure: XI ROBOTIC ASSISTED LOWER ANTERIOR RESECTION WITH END COLOSTOMY CREATION;  Surgeon: Teresa Lonni HERO, MD;  Location: [facility];  Service: General;  Laterality: N/A;   XI ROBOTIC ASSISTED VENTRAL HERNIA N/A 07/15/2021   Procedure: XI ROBOTIC ASSISTED INCISIONAL HERNIA REPAIR WITH MESH;  Surgeon: Lyndel Deward PARAS, MD;  Location: [facility];  Service: General;  Laterality: N/A;   Family History  Family history unknown: Yes   Social History   Socioeconomic History   Marital status: Divorced    Spouse name: Not on file   Number of children: Not on file   Years of education: Not on file   Highest education level: Not on file  Occupational History   Not on file  Tobacco Use   Smoking status: Former    Current packs/day: 0.00    Types: Cigarettes    Quit date: 08/25/2019    Years since quitting: 4.0    Passive exposure: Past   Smokeless tobacco: Never  Vaping Use   Vaping status: Never Used  Substance and Sexual Activity   Alcohol  use: Yes    Comment: 3-4 beers 12 oz beers   Drug use: Never   Sexual  activity: Not Currently  Other Topics Concern   Not on file  Social History Narrative   Not on file   Social Drivers of Health   Financial Resource Strain: Low Risk  (09/22/2023)   Overall Financial Resource Strain (CARDIA)    Difficulty of Paying Living Expenses: Not hard at all  Food Insecurity: No Food Insecurity (09/22/2023)   Hunger Vital Sign    Worried About Running Out of Food in the Last Year: Never true    Ran Out of Food in the Last Year: Never true  Transportation Needs: No Transportation Needs (09/22/2023)   PRAPARE - Administrator, Civil Service (Medical): No    Lack of Transportation (Non-Medical): No  Physical Activity: Insufficiently Active (09/22/2023)   Exercise Vital Sign    Days of Exercise per Week: 3 days    Minutes of Exercise per Session: 30 min  Stress: No Stress Concern Present (09/22/2023)  [employer] of Occupational Health - Occupational Stress Questionnaire    Feeling of Stress: Not at all  Social Connections: Moderately Integrated (09/22/2023)   Social Connection and Isolation Panel    Frequency of Communication with Friends and Family: More than three times a week    Frequency of Social Gatherings with Friends and Family: More than three times a week    Attends Religious Services: More than 4 times per year    Active Member of [employer] or Organizations: Yes    Attends Engineer, structural: More than 4 times per year    Marital Status: Divorced    Tobacco Counseling Counseling given: Not Answered   Clinical Intake:  Pre-visit preparation completed: Yes  Pain : No/denies pain     Diabetes: No  How often do you need to have someone help you when you read instructions, pamphlets, or other written materials from your doctor or pharmacy?: 1 - Never  Interpreter Needed?: No  Information entered by :: Mliss Graff LPN   Activities of Daily Living    09/22/2023    9:39 AM  In your present state of health, do you  have any difficulty performing the following activities:  Hearing? 0  Vision? 0  Difficulty concentrating or making decisions? 0  Walking or climbing stairs? 0  Dressing or bathing? 0  Doing errands, shopping? 0  Preparing Food and eating ? N  Using the Toilet? N  In the past six months, have you accidently leaked urine? N  Do you have problems with loss of bowel control? N  Managing your Medications? N  Managing your Finances? N  Housekeeping or managing your Housekeeping? N    Patient Care Team: [place], Quintin PARAS, MD as PCP - General (Family Medicine)  Indicate any recent Medical Services you may have received from other than [facility] providers in the past year (date may be approximate).     Assessment:   This is a routine wellness examination for Couper.  Hearing/Vision screen Hearing Screening - Comments:: Tinnitus in both ears Vision Screening - Comments:: Not up to date   Goals Addressed             This Visit's Progress    Family-spend time with family   On track    Patient Stated   On track    Patient states his goal is to stay alive for the year     Patient Stated       Stay healthy       Depression Screen    09/22/2023    9:45 AM 02/13/2023    8:53 AM 11/14/2022    8:13 AM 10/07/2022    9:23 AM 08/05/2022   11:28 AM 03/18/2022    8:33 AM 01/30/2022    1:34 PM  PHQ 2/9 Scores  PHQ - 2 Score 0 0 0 2 0 0 0  PHQ- 9 Score 0 0 3 9  0 0  Exception Documentation      Medical reason Medical reason    Fall Risk    09/22/2023   10:02 AM 02/13/2023    8:52 AM 11/14/2022    8:13 AM 08/05/2022   11:26 AM 03/18/2022    8:33 AM  Fall Risk   Falls in the past year? 0 0 0 1 0  Number falls in past yr: 0 0 0 1 0  Injury with Fall? 0 0 0 1 0  Risk for fall due to :  No  Fall Risks No Fall Risks Impaired balance/gait;Impaired mobility;History of fall(s) No Fall Risks  Follow up Falls evaluation completed;Education provided;Falls prevention discussed Falls  evaluation completed Falls evaluation completed Education provided;Falls prevention discussed Falls evaluation completed      Data saved with a previous flowsheet row definition    MEDICARE RISK AT HOME: Medicare Risk at Home Any stairs in or around the home?: Yes If so, are there any without handrails?: No Home free of loose throw rugs in walkways, pet beds, electrical cords, etc?: Yes Adequate lighting in your home to reduce risk of falls?: Yes Life alert?: No Use of a cane, walker or w/c?: No Grab bars in the bathroom?: No Shower chair or bench in shower?: Yes Elevated toilet seat or a handicapped toilet?: No  TIMED UP AND GO:  Was the test performed?  No    Cognitive Function:        09/22/2023    9:47 AM 08/05/2022   11:33 AM 08/16/2021   11:53 AM  6CIT Screen  What Year? 0 points 0 points 0 points  What month? 0 points 0 points 0 points  What time? 0 points 0 points 0 points  Count back from 20 2 points 0 points 0 points  Months in reverse 0 points 0 points 2 points  Repeat phrase 0 points 0 points 4 points  Total Score 2 points 0 points 6 points    Immunizations  There is no immunization history on file for this patient.  TDAP status: Due, Education has been provided regarding the importance of this vaccine. Advised may receive this vaccine at local pharmacy or Health Dept. Aware to provide a copy of the vaccination record if obtained from local pharmacy or Health Dept. Verbalized acceptance and understanding.  Flu Vaccine status: Due, Education has been provided regarding the importance of this vaccine. Advised may receive this vaccine at local pharmacy or Health Dept. Aware to provide a copy of the vaccination record if obtained from local pharmacy or Health Dept. Verbalized acceptance and understanding.  Pneumococcal vaccine status: Due, Education has been provided regarding the importance of this vaccine. Advised may receive this vaccine at local pharmacy or  Health Dept. Aware to provide a copy of the vaccination record if obtained from local pharmacy or Health Dept. Verbalized acceptance and understanding.  Covid-19 vaccine status: Information provided on how to obtain vaccines.   Qualifies for Shingles Vaccine? Yes   Zostavax completed No   Shingrix Completed?: No.    Education has been provided regarding the importance of this vaccine. Patient has been advised to call insurance company to determine out of pocket expense if they have not yet received this vaccine. Advised may also receive vaccine at local pharmacy or Health Dept. Verbalized acceptance and understanding.  Screening Tests Health Maintenance  Topic Date Due   COVID-19 Vaccine (1) Never done   Hepatitis C Screening  Never done   DTaP/Tdap/Td (1 - Tdap) Never done   Zoster Vaccines- Shingrix (1 of 2) Never done   Pneumococcal Vaccine: 50+ Years (1 of 1 - PCV) Never done   INFLUENZA VACCINE  09/25/2023   Medicare Annual Wellness (AWV)  09/21/2024   Colonoscopy  04/21/2025   Hepatitis B Vaccines  Aged Out   HPV VACCINES  Aged Out   Meningococcal B Vaccine  Aged Out    Health Maintenance  Health Maintenance Due  Topic Date Due   COVID-19 Vaccine (1) Never done   Hepatitis C Screening  Never  done   DTaP/Tdap/Td (1 - Tdap) Never done   Zoster Vaccines- Shingrix (1 of 2) Never done   Pneumococcal Vaccine: 50+ Years (1 of 1 - PCV) Never done    Colorectal cancer screening: Type of screening: Colonoscopy. Completed 2024. Repeat every 3 years  Lung Cancer Screening: (Low Dose CT Chest recommended if Age 47-80 years, 20 pack-year currently smoking OR have quit w/in 15years.) does not qualify.   Lung Cancer Screening Referral:   Additional Screening:  Hepatitis C Screening  never done  Vision Screening: Recommended annual ophthalmology exams for early detection of glaucoma and other disorders of the eye. Is the patient up to date with their annual eye exam?  No  Who is  the provider or what is the name of the office in which the patient attends annual eye exams? Education provided If pt is not established with a provider, would they like to be referred to a provider to establish care? No .   Dental Screening: Recommended annual dental exams for proper oral hygiene   Community Resource Referral / Chronic Care Management: CRR required this visit?  No   CCM required this visit?  No     Plan:     I have personally reviewed and noted the following in the patient's chart:   Medical and social history Use of alcohol , tobacco or illicit drugs  Current medications and supplements including opioid prescriptions. Patient is not currently taking opioid prescriptions. Functional ability and status Nutritional status Physical activity Advanced directives List of other physicians Hospitalizations, surgeries, and ER visits in previous 12 months Vitals Screenings to include cognitive, depression, and falls Referrals and appointments  In addition, I have reviewed and discussed with patient certain preventive protocols, quality metrics, and best practice recommendations. A written personalized care plan for preventive services as well as general preventive health recommendations were provided to patient.     Mliss Graff, LPN   [id number]   After Visit Summary: (MyChart) Due to this being a telephonic visit, the after visit summary with patients personalized plan was offered to patient via MyChart   Nurse Notes:

## 2023-09-22 NOTE — Patient Instructions (Signed)
 [name] , Thank you for taking time to come for your Medicare Wellness Visit. I appreciate your ongoing commitment to your health goals. Please review the following plan we discussed and let me know if I can assist you in the future.   Screening recommendations/referrals: Colonoscopy: up to date Recommended yearly ophthalmology/optometry visit for glaucoma screening and checkup Recommended yearly dental visit for hygiene and checkup  Vaccinations: Influenza vaccine: Education provided Pneumococcal vaccine: Education provided Tdap vaccine: Education provided Shingles vaccine: Education provided     Preventive Care 65 Years and Older, Male Preventive care refers to lifestyle choices and visits with your health care provider that can promote health and wellness. What does preventive care include? A yearly physical exam. This is also called an annual well check. Dental exams once or twice a year. Routine eye exams. Ask your health care provider how often you should have your eyes checked. Personal lifestyle choices, including: Daily care of your teeth and gums. Regular physical activity. Eating a healthy diet. Avoiding tobacco and drug use. Limiting alcohol  use. Practicing safe sex. Taking low doses of aspirin every day. Taking vitamin and mineral supplements as recommended by your health care provider. What happens during an annual well check? The services and screenings done by your health care provider during your annual well check will depend on your age, overall health, lifestyle risk factors, and family history of disease. Counseling  Your health care provider may ask you questions about your: Alcohol  use. Tobacco use. Drug use. Emotional well-being. Home and relationship well-being. Sexual activity. Eating habits. History of falls. Memory and ability to understand (cognition). Work and work Astronomer. Screening  You may have the following tests or  measurements: Height, weight, and BMI. Blood pressure. Lipid and cholesterol levels. These may be checked every 5 years, or more frequently if you are over 51 years old. Skin check. Lung cancer screening. You may have this screening every year starting at age 31 if you have a 30-pack-year history of smoking and currently smoke or have quit within the past 15 years. Fecal occult blood test (FOBT) of the stool. You may have this test every year starting at age 86. Flexible sigmoidoscopy or colonoscopy. You may have a sigmoidoscopy every 5 years or a colonoscopy every 10 years starting at age 26. Prostate cancer screening. Recommendations will vary depending on your family history and other risks. Hepatitis C blood test. Hepatitis B blood test. Sexually transmitted disease (STD) testing. Diabetes screening. This is done by checking your blood sugar (glucose) after you have not eaten for a while (fasting). You may have this done every 1-3 years. Abdominal aortic aneurysm (AAA) screening. You may need this if you are a current or former smoker. Osteoporosis. You may be screened starting at age 37 if you are at high risk. Talk with your health care provider about your test results, treatment options, and if necessary, the need for more tests. Vaccines  Your health care provider may recommend certain vaccines, such as: Influenza vaccine. This is recommended every year. Tetanus, diphtheria, and acellular pertussis (Tdap, Td) vaccine. You may need a Td booster every 10 years. Zoster vaccine. You may need this after age 61. Pneumococcal 13-valent conjugate (PCV13) vaccine. One dose is recommended after age 82. Pneumococcal polysaccharide (PPSV23) vaccine. One dose is recommended after age 15. Talk to your health care provider about which screenings and vaccines you need and how often you need them. This information is not intended to replace advice given to  you by your health care provider. Make sure  you discuss any questions you have with your health care provider. Document Released: 03/09/2015 Document Revised: 10/31/2015 Document Reviewed: 12/12/2014 Elsevier Interactive Patient Education  2017 [employer].  Fall Prevention in the Home Falls can cause injuries. They can happen to people of all ages. There are many things you can do to make your home safe and to help prevent falls. What can I do on the outside of my home? Regularly fix the edges of walkways and driveways and fix any cracks. Remove anything that might make you trip as you walk through a door, such as a raised step or threshold. Trim any bushes or trees on the path to your home. Use bright outdoor lighting. Clear any walking paths of anything that might make someone trip, such as rocks or tools. Regularly check to see if handrails are loose or broken. Make sure that both sides of any steps have handrails. Any raised decks and porches should have guardrails on the edges. Have any leaves, snow, or ice cleared regularly. Use sand or salt on walking paths during winter. Clean up any spills in your garage right away. This includes oil or grease spills. What can I do in the bathroom? Use night lights. Install grab bars by the toilet and in the tub and shower. Do not use towel bars as grab bars. Use non-skid mats or decals in the tub or shower. If you need to sit down in the shower, use a plastic, non-slip stool. Keep the floor dry. Clean up any water that spills on the floor as soon as it happens. Remove soap buildup in the tub or shower regularly. Attach bath mats securely with double-sided non-slip rug tape. Do not have throw rugs and other things on the floor that can make you trip. What can I do in the bedroom? Use night lights. Make sure that you have a light by your bed that is easy to reach. Do not use any sheets or blankets that are too big for your bed. They should not hang down onto the floor. Have a firm  chair that has side arms. You can use this for support while you get dressed. Do not have throw rugs and other things on the floor that can make you trip. What can I do in the kitchen? Clean up any spills right away. Avoid walking on wet floors. Keep items that you use a lot in easy-to-reach places. If you need to reach something above you, use a strong step stool that has a grab bar. Keep electrical cords out of the way. Do not use floor polish or wax that makes floors slippery. If you must use wax, use non-skid floor wax. Do not have throw rugs and other things on the floor that can make you trip. What can I do with my stairs? Do not leave any items on the stairs. Make sure that there are handrails on both sides of the stairs and use them. Fix handrails that are broken or loose. Make sure that handrails are as long as the stairways. Check any carpeting to make sure that it is firmly attached to the stairs. Fix any carpet that is loose or worn. Avoid having throw rugs at the top or bottom of the stairs. If you do have throw rugs, attach them to the floor with carpet tape. Make sure that you have a light switch at the top of the stairs and the bottom of the stairs.  If you do not have them, ask someone to add them for you. What else can I do to help prevent falls? Wear shoes that: Do not have high heels. Have rubber bottoms. Are comfortable and fit you well. Are closed at the toe. Do not wear sandals. If you use a stepladder: Make sure that it is fully opened. Do not climb a closed stepladder. Make sure that both sides of the stepladder are locked into place. Ask someone to hold it for you, if possible. Clearly mark and make sure that you can see: Any grab bars or handrails. First and last steps. Where the edge of each step is. Use tools that help you move around (mobility aids) if they are needed. These include: Canes. Walkers. Scooters. Crutches. Turn on the lights when you go  into a dark area. Replace any light bulbs as soon as they burn out. Set up your furniture so you have a clear path. Avoid moving your furniture around. If any of your floors are uneven, fix them. If there are any pets around you, be aware of where they are. Review your medicines with your doctor. Some medicines can make you feel dizzy. This can increase your chance of falling. Ask your doctor what other things that you can do to help prevent falls. This information is not intended to replace advice given to you by your health care provider. Make sure you discuss any questions you have with your health care provider. Document Released: 12/07/2008 Document Revised: 07/19/2015 Document Reviewed: 03/17/2014 Elsevier Interactive Patient Education  2017 [employer].

## 2023-09-25 ENCOUNTER — Ambulatory Visit (HOSPITAL_BASED_OUTPATIENT_CLINIC_OR_DEPARTMENT_OTHER): Admitting: Family Medicine

## 2023-10-13 ENCOUNTER — Emergency Department (HOSPITAL_COMMUNITY)
Admission: EM | Admit: 2023-10-13 | Discharge: 2023-10-13 | Disposition: A | Discharge status: Home / Self Care | Attending: Emergency Medicine | Admitting: Emergency Medicine

## 2023-10-13 ENCOUNTER — Encounter (HOSPITAL_COMMUNITY): Payer: Self-pay

## 2023-10-13 ENCOUNTER — Other Ambulatory Visit: Payer: Self-pay

## 2023-10-13 ENCOUNTER — Emergency Department (HOSPITAL_COMMUNITY)

## 2023-10-13 DIAGNOSIS — Y92002 Bathroom of unspecified non-institutional (private) residence single-family (private) house as the place of occurrence of the external cause: Secondary | ICD-10-CM | POA: Diagnosis not present

## 2023-10-13 DIAGNOSIS — S8991XA Unspecified injury of right lower leg, initial encounter: Secondary | ICD-10-CM

## 2023-10-13 DIAGNOSIS — I10 Essential (primary) hypertension: Secondary | ICD-10-CM | POA: Diagnosis not present

## 2023-10-13 DIAGNOSIS — M85861 Other specified disorders of bone density and structure, right lower leg: Secondary | ICD-10-CM | POA: Diagnosis not present

## 2023-10-13 DIAGNOSIS — Z87891 Personal history of nicotine dependence: Secondary | ICD-10-CM | POA: Insufficient documentation

## 2023-10-13 DIAGNOSIS — J449 Chronic obstructive pulmonary disease, unspecified: Secondary | ICD-10-CM | POA: Insufficient documentation

## 2023-10-13 DIAGNOSIS — S8001XA Contusion of right knee, initial encounter: Secondary | ICD-10-CM | POA: Insufficient documentation

## 2023-10-13 DIAGNOSIS — W19XXXA Unspecified fall, initial encounter: Secondary | ICD-10-CM | POA: Insufficient documentation

## 2023-10-13 DIAGNOSIS — Z79899 Other long term (current) drug therapy: Secondary | ICD-10-CM | POA: Insufficient documentation

## 2023-10-13 DIAGNOSIS — Z85038 Personal history of other malignant neoplasm of large intestine: Secondary | ICD-10-CM | POA: Diagnosis not present

## 2023-10-13 DIAGNOSIS — M25561 Pain in right knee: Secondary | ICD-10-CM | POA: Diagnosis not present

## 2023-10-13 LAB — BASIC METABOLIC PANEL WITH GFR
Anion gap: 14 (ref 5–15)
BUN: <5 mg/dL — ABNORMAL LOW (ref 8–23)
CO2: 20 mmol/L — ABNORMAL LOW (ref 22–32)
Calcium: 8.3 mg/dL — ABNORMAL LOW (ref 8.9–10.3)
Chloride: 107 mmol/L (ref 98–111)
Creatinine, Ser: 0.65 mg/dL (ref 0.61–1.24)
GFR, Estimated: >60 mL/min (ref >60)
Glucose, Bld: 122 mg/dL — ABNORMAL HIGH (ref 70–99)
Potassium: 3.9 mmol/L (ref 3.5–5.1)
Sodium: 141 mmol/L (ref 135–145)

## 2023-10-13 LAB — CBC
HCT: 40.2 % (ref 39.0–52.0)
Hemoglobin: 13.7 g/dL (ref 13.0–17.0)
MCH: 34.9 pg — ABNORMAL HIGH (ref 26.0–34.0)
MCHC: 34.1 g/dL (ref 30.0–36.0)
MCV: 102.6 fL — ABNORMAL HIGH (ref 80.0–100.0)
Platelets: 285 K/uL (ref 150–400)
RBC: 3.92 MIL/uL — ABNORMAL LOW (ref 4.22–5.81)
RDW: 12.8 % (ref 11.5–15.5)
WBC: 12.2 K/uL — ABNORMAL HIGH (ref 4.0–10.5)
nRBC: 0 % (ref 0.0–0.2)

## 2023-10-13 MED ORDER — OXYCODONE HCL 5 MG PO TABS
5.0000 mg | ORAL_TABLET | Freq: Four times a day (QID) | ORAL | 0 refills | Status: AC | PRN
Start: 2023-10-13 — End: ?

## 2023-10-13 MED ORDER — ONDANSETRON HCL 4 MG/2ML IJ SOLN
4.0000 mg | Freq: Once | INTRAMUSCULAR | Status: AC
  Administered 2023-10-13: 4 mg via INTRAVENOUS
  Filled 2023-10-13: qty 2

## 2023-10-13 MED ORDER — ONDANSETRON 4 MG PO TBDP: 4.0000 mg | ORAL_TABLET | Freq: Three times a day (TID) | ORAL | 0 refills | Status: AC | PRN

## 2023-10-13 MED ORDER — MORPHINE SULFATE (PF) 4 MG/ML IV SOLN
4.0000 mg | Freq: Once | INTRAVENOUS | Status: AC
  Administered 2023-10-13: 4 mg via INTRAVENOUS
  Filled 2023-10-13: qty 1

## 2023-10-13 MED ORDER — NAPROXEN 375 MG PO TABS: 375.0000 mg | ORAL_TABLET | Freq: Two times a day (BID) | ORAL | 0 refills | Status: AC

## 2023-10-13 MED ORDER — ACETAMINOPHEN 500 MG PO TABS: 1000.0000 mg | ORAL_TABLET | Freq: Three times a day (TID) | ORAL | 0 refills | Status: AC

## 2023-10-13 MED ORDER — OXYCODONE-ACETAMINOPHEN 5-325 MG PO TABS
1.0000 | ORAL_TABLET | ORAL | Status: DC | PRN
  Administered 2023-10-13: 1 via ORAL
  Filled 2023-10-13: qty 1

## 2023-10-13 NOTE — ED Provider Notes (Signed)
  Physical Exam  BP 112/63 (BP Location: Right Arm)   Pulse (!) 117   Temp 98.3 F (36.8 C)   Resp 17   Ht 5' 8 (1.727 m)   Wt 54.4 kg   SpO2 97%   BMI 18.25 kg/m   Physical Exam Vitals and nursing note reviewed.  Constitutional:      General: He is not in acute distress.    Appearance: He is well-developed.  HENT:     Head: Normocephalic and atraumatic.  Eyes:     Conjunctiva/sclera: Conjunctivae normal.  Cardiovascular:     Rate and Rhythm: Normal rate and regular rhythm.     Heart sounds: No murmur heard. Pulmonary:     Effort: Pulmonary effort is normal. No respiratory distress.     Breath sounds: Normal breath sounds.  Abdominal:     Palpations: Abdomen is soft.     Tenderness: There is no abdominal tenderness.  Musculoskeletal:        General: Swelling and tenderness present.     Cervical back: Neck supple.  Skin:    General: Skin is warm and dry.     Capillary Refill: Capillary refill takes less than 2 seconds.  Neurological:     Mental Status: He is alert.  Psychiatric:        Mood and Affect: Mood normal.     Procedures  .Ortho Injury Treatment  Date/Time: 10/13/2023 3:52 PM  Performed by: Albertina Dixon, MD Authorized by: Albertina Dixon, MD   Consent:    Consent obtained:  Verbal   Consent given by:  Patient   Risks discussed:  Fracture, nerve damage, restricted joint movement and vascular damage   Alternatives discussed:  No treatment and alternative treatmentInjury location: knee Location details: right knee Pre-procedure distal perfusion: normal Pre-procedure neurological function: normal Pre-procedure range of motion: reduced Immobilization: brace and crutches Post-procedure distal perfusion: normal Post-procedure neurological function: normal Post-procedure range of motion: unchanged     ED Course / MDM    Medical Decision Making Amount and/or Complexity of Data Reviewed Labs: ordered. Radiology: ordered.  Risk OTC  drugs. Prescription drug management.   Patient received in handoff.  Fall with right knee pain.  X-ray with effusion but no obvious fracture.  Pending CT of the knee.  Knee CT concerning for a large lipohemarthrosis with a nondisplaced tibial fracture.  Spoke with Ozell Purchase of orthopedics who reviewed the scan and has concern for ACL tear.  Recommending knee immobilizer, weightbearing as tolerated, pain control and crutches with outpatient follow-up.  Outpatient orthopedic referral placed.  Pain medicine sent to patient's pharmacy but at this time he does not meet inpatient criteria for admission and be discharged outpatient follow-up.       Albertina Dixon, MD 10/13/23 [phone]

## 2023-10-13 NOTE — ED Triage Notes (Signed)
 Patient reports he fell last night in his bathroom and landed on his knee, today it is swollen and and bruised with decreased movement and he is unable to bear weight.

## 2023-10-13 NOTE — Discharge Instructions (Signed)
 For pain:  - Acetaminophen 1000 mg three times daily (every 8 hours) - Naproxen 2 times daily (every 12 hours) - oxycodone for breakthrough pain only

## 2023-10-13 NOTE — ED Provider Notes (Signed)
 [facility] Provider Note  CSN: [id number] Arrival date & time: 10/13/23 1139  Chief Complaint(s) Knee Injury  HPI Marc Mcdonald is a 68 y.o. male with past medical history as below, significant for COPD, colon cancer, HTN who presents to the ED with complaint of fall/knee injury  Pt reports last night had trip/fall in the bathroom, landed on his right knee. Unable to bear weight since then, pain getting worse. No other injuries reported, no head injury or LOC. Not anticoagulated. Denies prior orthopedic intervention  Past Medical History Past Medical History:  Diagnosis Date   Arthritis    Colon cancer (HCC)    COPD (chronic obstructive pulmonary disease) (HCC)    Duodenal ulcer    Hypertension    Patient Active Problem List   Diagnosis Date Noted   Pneumothorax, left 09/15/2022   Tinnitus 11/19/2021   Scalp laceration 09/27/2021   Incisional hernia 07/15/2021   Hypertension 03/15/2021   S/P colostomy takedown 02/13/2021   Insomnia 07/20/2020   Pulmonary nodule 06/26/2020   History of creation of ostomy (HCC) 06/26/2020   Hyponatremia 06/06/2020   sigmoid colon cancer 06/03/2020   Ileus following gastrointestinal surgery (HCC)    Aortic atherosclerosis (HCC) 06/01/2020   Protein-calorie malnutrition, severe 05/28/2020   Iron  deficiency anemia due to chronic blood loss    Duodenal ulcer    Gastritis and gastroduodenitis    Gastroesophageal reflux disease with esophagitis without hemorrhage    Esophageal stricture    Rectal mass    Shortness of breath 05/25/2020   Hypotension 05/25/2020   Tachycardia 05/25/2020   Phase of life problem 05/25/2020   Pruritus 05/25/2020   Symptomatic anemia 05/25/2020   Surgical follow-up care 10/12/2019   Left-sided chest wall pain 09/14/2019   Closed traumatic fracture of ribs of left side with pneumothorax 09/09/2019   Home Medication(s) Prior to Admission medications   Medication  Sig Start Date End Date Taking? Authorizing Provider  acetaminophen  (TYLENOL ) 500 MG tablet Take 2 tablets (1,000 mg total) by mouth every 8 (eight) hours. 10/13/23 11/12/23 Yes Kommor, Madison, MD  naproxen  (NAPROSYN ) 375 MG tablet Take 1 tablet (375 mg total) by mouth 2 (two) times daily. 10/13/23  Yes Kommor, Madison, MD  ondansetron  (ZOFRAN -ODT) 4 MG disintegrating tablet Take 1 tablet (4 mg total) by mouth every 8 (eight) hours as needed for nausea or vomiting. 10/13/23  Yes Kommor, Madison, MD  oxyCODONE  (ROXICODONE ) 5 MG immediate release tablet Take 1 tablet (5 mg total) by mouth every 6 (six) hours as needed for breakthrough pain. 10/13/23  Yes Kommor, Madison, MD  amLODipine  (NORVASC ) 5 MG tablet TAKE 1 TABLET (5 MG TOTAL) BY MOUTH DAILY. 09/16/23   de [place], Quintin PARAS, MD  guaiFENesin  200 MG tablet Take 1 tablet (200 mg total) by mouth every 6 (six) hours as needed for cough or to loosen phlegm. 09/28/22   Meuth, Brooke A, PA-C  methocarbamol  (ROBAXIN ) 500 MG tablet Take 1-2 tablets (500-1,000 mg total) by mouth every 8 (eight) hours as needed for muscle spasms. 09/28/22   Meuth, Brooke A, PA-C  Multiple Vitamin (MULTIVITAMIN WITH MINERALS) TABS tablet Take 1 tablet by mouth daily. 09/28/22   Meuth, Brooke A, PA-C  pantoprazole  (PROTONIX ) 40 MG tablet TAKE 1 TABLET BY MOUTH EVERY DAY 04/06/23   de [place], Quintin PARAS, MD  Pediatric Multiple Vitamins (CHILDRENS MULTIVITAMIN) chewable tablet Chew 1 tablet by mouth daily. With iron     [provider]  polyethylene  glycol (MIRALAX  / GLYCOLAX ) 17 g packet Take 17 g by mouth daily as needed (constipation). 09/28/22   Meuth, Lyle LABOR, PA-C  sodium chloride  1 g tablet Take 1 tablet (1 g total) by mouth 3 (three) times daily with meals. 09/28/22   Meuth, Brooke A, PA-C  traZODone  (DESYREL ) 50 MG tablet TAKE 1 TABLET BY MOUTH AT BEDTIME AS NEEDED FOR SLEEP 04/08/23   de [place], Quintin PARAS, MD                                                                                                                                     Past Surgical History Past Surgical History:  Procedure Laterality Date   BALLOON DILATION N/A 05/26/2020   Procedure: BALLOON DILATION;  Surgeon: San Sandor GAILS, DO;  Location: [facility];  Service: Endoscopy;  Laterality: N/A;   BIOPSY  05/26/2020   Procedure: BIOPSY;  Surgeon: San Sandor GAILS, DO;  Location: [facility];  Service: Endoscopy;;   COLONOSCOPY WITH PROPOFOL  N/A 05/26/2020   Procedure: COLONOSCOPY WITH PROPOFOL ;  Surgeon: San Sandor GAILS, DO;  Location: [facility];  Service: Endoscopy;  Laterality: N/A;   COLOSTOMY N/A 05/30/2020   Procedure: COLOSTOMY;  Surgeon: Teresa Lonni HERO, MD;  Location: [facility];  Service: General;  Laterality: N/A;   ESOPHAGOGASTRODUODENOSCOPY (EGD) WITH PROPOFOL  N/A 05/26/2020   Procedure: ESOPHAGOGASTRODUODENOSCOPY (EGD) WITH PROPOFOL ;  Surgeon: San Sandor GAILS, DO;  Location: [facility];  Service: Endoscopy;  Laterality: N/A;   FLEXIBLE SIGMOIDOSCOPY N/A 02/13/2021   Procedure: FLEXIBLE SIGMOIDOSCOPY;  Surgeon: Teresa Lonni HERO, MD;  Location: [facility];  Service: General;  Laterality: N/A;   HERNIA REPAIR     PARASTOMAL HERNIA REPAIR N/A 02/13/2021   Procedure: PRIMARY REPAIR CLOSURE OF PARASTOMAL HERNIA, INTRAOPERATIVE ASSESSMENT OF PERFUSION;  Surgeon: Teresa Lonni HERO, MD;  Location: [facility];  Service: General;  Laterality: N/A;   SUBMUCOSAL TATTOO INJECTION  05/26/2020   Procedure: SUBMUCOSAL TATTOO INJECTION;  Surgeon: San Sandor GAILS, DO;  Location: [facility];  Service: Endoscopy;;   XI ROBOTIC ASSISTED COLOSTOMY TAKEDOWN N/A 02/13/2021   Procedure: XI ROBOTIC ASSISTED COLOSTOMY TAKEDOWN, TAP BLOCK;  Surgeon: Teresa Lonni HERO, MD;  Location: [facility];  Service: General;  Laterality: N/A;   XI ROBOTIC ASSISTED LOWER ANTERIOR RESECTION N/A 05/30/2020   Procedure: XI ROBOTIC ASSISTED LOWER ANTERIOR RESECTION WITH END COLOSTOMY CREATION;  Surgeon: Teresa Lonni HERO, MD;  Location: [facility];  Service: General;  Laterality: N/A;   XI ROBOTIC ASSISTED VENTRAL HERNIA N/A 07/15/2021   Procedure: XI ROBOTIC ASSISTED INCISIONAL HERNIA REPAIR WITH MESH;  Surgeon: Lyndel Deward PARAS, MD;  Location: [facility];  Service: General;  Laterality: N/A;   Family History Family History  Family history unknown: Yes    Social History Social History   Tobacco Use   Smoking status: Former    Current packs/day: 0.00    Types: Cigarettes    Quit date:  08/25/2019    Years since quitting: 4.1    Passive exposure: Past   Smokeless tobacco: Never  Vaping Use   Vaping status: Never Used  Substance Use Topics   Alcohol  use: Yes    Comment: 3-4 beers 12 oz beers   Drug use: Never   Allergies Patient has no known allergies.  Review of Systems A thorough review of systems was obtained and all systems are negative except as noted in the HPI and PMH.   Physical Exam Vital Signs  I have reviewed the triage vital signs BP 112/63 (BP Location: Right Arm)   Pulse (!) 117   Temp 98.3 F (36.8 C)   Resp 17   Ht 5' 8 (1.727 m)   Wt 54.4 kg   SpO2 97%   BMI 18.25 kg/m  Physical Exam Vitals and nursing note reviewed.  Constitutional:      General: He is not in acute distress.    Appearance: Normal appearance. He is well-developed. He is not ill-appearing.  HENT:     Head: Normocephalic and atraumatic.     Right Ear: External ear normal.     Left Ear: External ear normal.     Nose: Nose normal.     Mouth/Throat:     Mouth: Mucous membranes are moist.  Eyes:     General: No scleral icterus.       Right eye: No discharge.        Left eye: No discharge.  Cardiovascular:     Rate and Rhythm: Normal rate.  Pulmonary:     Effort: Pulmonary effort is normal. No respiratory distress.     Breath sounds: No stridor.  Abdominal:     General: Abdomen is flat. There is no distension.     Tenderness: There is no guarding.  Musculoskeletal:        General:  No deformity.     Cervical back: No rigidity.       Legs:     Comments: LE NVI Pain w/ passive ROM to RLE Right hip/ankle w/o ttp No abnormalities noted to LLE  Skin:    General: Skin is warm and dry.     Coloration: Skin is not cyanotic, jaundiced or pale.  Neurological:     Mental Status: He is alert and oriented to person, place, and time.     GCS: GCS eye subscore is 4. GCS verbal subscore is 5. GCS motor subscore is 6.  Psychiatric:        Speech: Speech normal.        Behavior: Behavior normal. Behavior is cooperative.     ED Results and Treatments Labs (all labs ordered are listed, but only abnormal results are displayed) Labs Reviewed  CBC - Abnormal; Notable for the following components:      Result Value   WBC 12.2 (*)    RBC 3.92 (*)    MCV 102.6 (*)    MCH 34.9 (*)    All other components within normal limits  BASIC METABOLIC PANEL WITH GFR - Abnormal; Notable for the following components:   CO2 20 (*)    Glucose, Bld 122 (*)    BUN <5 (*)    Calcium  8.3 (*)    All other components within normal limits  Radiology CT Knee Right Wo Contrast Result Date: 10/13/2023 CLINICAL DATA:  Pain after fall. EXAM: CT OF THE RIGHT KNEE WITHOUT CONTRAST TECHNIQUE: Multidetector CT imaging of the right knee was performed according to the standard protocol. Multiplanar CT image reconstructions were also generated. RADIATION DOSE REDUCTION: This exam was performed according to the departmental dose-optimization program which includes automated exposure control, adjustment of the mA and/or kV according to patient size and/or use of iterative reconstruction technique. COMPARISON:  Right knee radiographs dated 10/13/2023. FINDINGS: Bones/Joint/Cartilage Large volume lipohemarthrosis. Diffuse osseous demineralization. Subtle linear cortical lucency extending along the  medial tibial spine is concerning for a nondisplaced intra-articular fracture (series 6, images 31-34). No additional fracture identified. No appreciable articular surface step-off. Mild medial femorotibial compartment joint space narrowing. Ligaments Ligaments are suboptimally evaluated by CT. Muscles and Tendons Muscles are normal. No muscle atrophy. No intramuscular fluid collection or hematoma. Soft tissue Prepatellar soft tissue swelling. No radiopaque foreign body. Peripheral vascular calcifications. IMPRESSION: 1. Large volume lipohemarthrosis with subtle linear cortical lucency extending along the medial tibial spine, concerning for nondisplaced intra-articular fracture. No additional fracture identified. 2. Prepatellar soft tissue swelling.  No radiopaque foreign body. Electronically Signed   By: Harrietta Sherry M.D.   On: 10/13/2023 15:13   DG Knee Complete 4 Views Right Result Date: 10/13/2023 CLINICAL DATA:  Right knee pain. EXAM: RIGHT KNEE - COMPLETE 4+ VIEW COMPARISON:  None Available. FINDINGS: No obvious fracture. Evaluation for fracture is however limited due to osteopenia. There is a large lipohemarthrosis suggestive of a possible occult fracture. Mild depression of the lateral tibial plateau on the oblique view noted. The soft tissues are unremarkable. IMPRESSION: Large lipohemarthrosis suggestive of a possible occult fracture. CT is recommended for further evaluation. Electronically Signed   By: Vanetta Chou M.D.   On: 10/13/2023 13:22    Pertinent labs & imaging results that were available during my care of the patient were reviewed by me and considered in my medical decision making (see MDM for details).  Medications Ordered in ED Medications  oxyCODONE -acetaminophen  (PERCOCET/ROXICET) 5-325 MG per tablet 1 tablet (1 tablet Oral Given 10/13/23 1232)  ondansetron  (ZOFRAN ) injection 4 mg (has no administration in time range)  morphine  (PF) 4 MG/ML injection 4 mg (4 mg Intravenous  Given 10/13/23 1422)  ondansetron  (ZOFRAN ) injection 4 mg (4 mg Intravenous Given 10/13/23 1422)                                                                                                                                     Procedures Procedures  (including critical care time)  Medical Decision Making / ED Course    Medical Decision Making:    Marc Mcdonald is a 68 y.o. male with past medical history as below, significant for COPD, colon cancer, HTN who presents to the ED with complaint of fall/knee injury. The complaint involves an extensive differential diagnosis and also  carries with it a high risk of complications and morbidity.  Serious etiology was considered. Ddx includes but is not limited to: fx, dislocation, contusion, ligamentous injury, etc  Complete initial physical exam performed, notably the patient was in nad, sitting comfortably.    Reviewed and confirmed nursing documentation for past medical history, family history, social history.  Vital signs reviewed.    Right knee injury > - pt with apparent mechanical trip/fall last night - unable to ambulate 2/2 pain since then - XR obtained in triage concerning for occult fx - send labs / CT Knee, provide further analgesia - handoff to Dr Albertina pending CT and further eval                    Additional history obtained: -Additional history obtained from na -External records from outside source obtained and reviewed including: Chart review including previous notes, labs, imaging, consultation notes including  Prior er eval Office note   Lab Tests: -I ordered, reviewed, and interpreted labs.   The pertinent results include:   Labs Reviewed  CBC - Abnormal; Notable for the following components:      Result Value   WBC 12.2 (*)    RBC 3.92 (*)    MCV 102.6 (*)    MCH 34.9 (*)    All other components within normal limits  BASIC METABOLIC PANEL WITH GFR - Abnormal; Notable for the following  components:   CO2 20 (*)    Glucose, Bld 122 (*)    BUN <5 (*)    Calcium  8.3 (*)    All other components within normal limits    Notable for labs stable  EKG   EKG Interpretation Date/Time:    Ventricular Rate:    PR Interval:    QRS Duration:    QT Interval:    QTC Calculation:   R Axis:      Text Interpretation:           Imaging Studies ordered: I ordered imaging studies including knee xr ct knee > ct pending I independently visualized the following imaging with scope of interpretation limited to determining acute life threatening conditions related to emergency care; findings noted above I agree with the radiologist interpretation If any imaging was obtained with contrast I closely monitored patient for any possible adverse reaction a/w contrast administration in the emergency department   Medicines ordered and prescription drug management: Meds ordered this encounter  Medications   oxyCODONE -acetaminophen  (PERCOCET/ROXICET) 5-325 MG per tablet 1 tablet    Refill:  0   morphine  (PF) 4 MG/ML injection 4 mg   ondansetron  (ZOFRAN ) injection 4 mg   naproxen  (NAPROSYN ) 375 MG tablet    Sig: Take 1 tablet (375 mg total) by mouth 2 (two) times daily.    Dispense:  20 tablet    Refill:  0   acetaminophen  (TYLENOL ) 500 MG tablet    Sig: Take 2 tablets (1,000 mg total) by mouth every 8 (eight) hours.    Dispense:  180 tablet    Refill:  0   oxyCODONE  (ROXICODONE ) 5 MG immediate release tablet    Sig: Take 1 tablet (5 mg total) by mouth every 6 (six) hours as needed for breakthrough pain.    Dispense:  10 tablet    Refill:  0   ondansetron  (ZOFRAN ) injection 4 mg   ondansetron  (ZOFRAN -ODT) 4 MG disintegrating tablet    Sig: Take 1 tablet (4 mg total) by mouth every 8 (eight) hours  as needed for nausea or vomiting.    Dispense:  20 tablet    Refill:  0    -I have reviewed the patients home medicines and have made adjustments as needed   Consultations  Obtained: na   Cardiac Monitoring: Continuous pulse oximetry interpreted by myself, 98% on RA.    Social Determinants of Health:  Diagnosis or treatment significantly limited by social determinants of health: former smoker   Reevaluation: After the interventions noted above, I reevaluated the patient and found that they have improved  Co morbidities that complicate the patient evaluation  Past Medical History:  Diagnosis Date   Arthritis    Colon cancer (HCC)    COPD (chronic obstructive pulmonary disease) (HCC)    Duodenal ulcer    Hypertension       Dispostion: Disposition decision including need for hospitalization was considered, and patient disposition pending at time of sign out.    Final Clinical Impression(s) / ED Diagnoses Final diagnoses:  Injury of right knee, initial encounter        Marc Jayson LABOR, DO 10/13/23 1558

## 2023-11-18 NOTE — Progress Notes (Signed)
 Marc Mcdonald                                          MRN: [id number]   11/18/2023   The [facility] Quality Team Specialist reviewed this patient medical record for the purposes of chart review for care gap closure. The following were reviewed: chart review for care gap closure-controlling blood pressure.    [facility] Quality Team

## 2023-11-27 ENCOUNTER — Other Ambulatory Visit (HOSPITAL_BASED_OUTPATIENT_CLINIC_OR_DEPARTMENT_OTHER): Payer: Self-pay | Admitting: Family Medicine

## 2023-12-22 ENCOUNTER — Other Ambulatory Visit (HOSPITAL_BASED_OUTPATIENT_CLINIC_OR_DEPARTMENT_OTHER): Payer: Self-pay | Admitting: Family Medicine

## 2023-12-22 DIAGNOSIS — G4700 Insomnia, unspecified: Secondary | ICD-10-CM

## 2024-01-01 ENCOUNTER — Encounter (HOSPITAL_BASED_OUTPATIENT_CLINIC_OR_DEPARTMENT_OTHER): Payer: Self-pay | Admitting: Family Medicine

## 2024-01-01 ENCOUNTER — Ambulatory Visit (INDEPENDENT_AMBULATORY_CARE_PROVIDER_SITE_OTHER): Admitting: Family Medicine

## 2024-01-01 VITALS — BP 113/67 | HR 100 | Ht 68.0 in | Wt 119.8 lb

## 2024-01-01 DIAGNOSIS — H9313 Tinnitus, bilateral: Secondary | ICD-10-CM

## 2024-01-01 DIAGNOSIS — H538 Other visual disturbances: Secondary | ICD-10-CM

## 2024-01-01 DIAGNOSIS — Z0000 Encounter for general adult medical examination without abnormal findings: Secondary | ICD-10-CM

## 2024-01-01 DIAGNOSIS — S8990XA Unspecified injury of unspecified lower leg, initial encounter: Secondary | ICD-10-CM

## 2024-01-01 NOTE — Assessment & Plan Note (Signed)
 Routine HCM labs ordered. HCM reviewed/discussed. Anticipatory guidance regarding healthy weight, lifestyle and choices given. Recommend healthy diet.  Recommend approximately 150 minutes/week of moderate intensity exercise Recommend regular dental and vision exams Always use seatbelt/lap and shoulder restraints Recommend using smoke alarms and checking batteries at least twice a year Recommend using sunscreen when outside Discussed colon cancer screening recommendations, options.  Patient is UTD Discussed immunization recommendations

## 2024-01-01 NOTE — Progress Notes (Signed)
 Subjective:    CC: Annual Physical Exam  HPI:  Marc Mcdonald is a 68 y.o. presenting for annual physical  I reviewed the past medical history, family history, social history, surgical history, and allergies today and no changes were needed.  Please see the problem list section below in epic for further details.  Past Medical History: Past Medical History:  Diagnosis Date   Arthritis    Colon cancer (HCC)    COPD (chronic obstructive pulmonary disease) (HCC)    Duodenal ulcer    Hypertension    Past Surgical History: Past Surgical History:  Procedure Laterality Date   BALLOON DILATION N/A 05/26/2020   Procedure: BALLOON DILATION;  Surgeon: San Sandor GAILS, DO;  Location: [facility];  Service: Endoscopy;  Laterality: N/A;   BIOPSY  05/26/2020   Procedure: BIOPSY;  Surgeon: San Sandor GAILS, DO;  Location: [facility];  Service: Endoscopy;;   COLONOSCOPY WITH PROPOFOL  N/A 05/26/2020   Procedure: COLONOSCOPY WITH PROPOFOL ;  Surgeon: San Sandor GAILS, DO;  Location: [facility];  Service: Endoscopy;  Laterality: N/A;   COLOSTOMY N/A 05/30/2020   Procedure: COLOSTOMY;  Surgeon: Teresa Lonni HERO, MD;  Location: [facility];  Service: General;  Laterality: N/A;   ESOPHAGOGASTRODUODENOSCOPY (EGD) WITH PROPOFOL  N/A 05/26/2020   Procedure: ESOPHAGOGASTRODUODENOSCOPY (EGD) WITH PROPOFOL ;  Surgeon: San Sandor GAILS, DO;  Location: [facility];  Service: Endoscopy;  Laterality: N/A;   FLEXIBLE SIGMOIDOSCOPY N/A 02/13/2021   Procedure: FLEXIBLE SIGMOIDOSCOPY;  Surgeon: Teresa Lonni HERO, MD;  Location: [facility];  Service: General;  Laterality: N/A;   HERNIA REPAIR     PARASTOMAL HERNIA REPAIR N/A 02/13/2021   Procedure: PRIMARY REPAIR CLOSURE OF PARASTOMAL HERNIA, INTRAOPERATIVE ASSESSMENT OF PERFUSION;  Surgeon: Teresa Lonni HERO, MD;  Location: [facility];  Service: General;  Laterality: N/A;   SUBMUCOSAL TATTOO INJECTION  05/26/2020   Procedure: SUBMUCOSAL TATTOO INJECTION;   Surgeon: San Sandor GAILS, DO;  Location: [facility];  Service: Endoscopy;;   XI ROBOTIC ASSISTED COLOSTOMY TAKEDOWN N/A 02/13/2021   Procedure: XI ROBOTIC ASSISTED COLOSTOMY TAKEDOWN, TAP BLOCK;  Surgeon: Teresa Lonni HERO, MD;  Location: [facility];  Service: General;  Laterality: N/A;   XI ROBOTIC ASSISTED LOWER ANTERIOR RESECTION N/A 05/30/2020   Procedure: XI ROBOTIC ASSISTED LOWER ANTERIOR RESECTION WITH END COLOSTOMY CREATION;  Surgeon: Teresa Lonni HERO, MD;  Location: [facility];  Service: General;  Laterality: N/A;   XI ROBOTIC ASSISTED VENTRAL HERNIA N/A 07/15/2021   Procedure: XI ROBOTIC ASSISTED INCISIONAL HERNIA REPAIR WITH MESH;  Surgeon: Lyndel Deward PARAS, MD;  Location: [facility];  Service: General;  Laterality: N/A;   Social History: Social History   Socioeconomic History   Marital status: Divorced    Spouse name: Not on file   Number of children: Not on file   Years of education: Not on file   Highest education level: Not on file  Occupational History   Not on file  Tobacco Use   Smoking status: Former    Current packs/day: 0.00    Types: Cigarettes    Quit date: 08/25/2019    Years since quitting: 4.3    Passive exposure: Past   Smokeless tobacco: Never  Vaping Use   Vaping status: Never Used  Substance and Sexual Activity   Alcohol  use: Yes    Comment: 3-4 beers 12 oz beers   Drug use: Never   Sexual activity: Not Currently  Other Topics Concern   Not on file  Social History Narrative   Not on  file   Social Drivers of Health   Financial Resource Strain: Low Risk  (09/22/2023)   Overall Financial Resource Strain (CARDIA)    Difficulty of Paying Living Expenses: Not hard at all  Food Insecurity: No Food Insecurity (09/22/2023)   Hunger Vital Sign    Worried About Running Out of Food in the Last Year: Never true    Ran Out of Food in the Last Year: Never true  Transportation Needs: No Transportation Needs (09/22/2023)   PRAPARE - Doctor, General Practice (Medical): No    Lack of Transportation (Non-Medical): No  Physical Activity: Insufficiently Active (09/22/2023)   Exercise Vital Sign    Days of Exercise per Week: 3 days    Minutes of Exercise per Session: 30 min  Stress: No Stress Concern Present (09/22/2023)   [employer] of Occupational Health - Occupational Stress Questionnaire    Feeling of Stress: Not at all  Social Connections: Moderately Integrated (09/22/2023)   Social Connection and Isolation Panel    Frequency of Communication with Friends and Family: More than three times a week    Frequency of Social Gatherings with Friends and Family: More than three times a week    Attends Religious Services: More than 4 times per year    Active Member of [employer] or Organizations: Yes    Attends Engineer, Structural: More than 4 times per year    Marital Status: Divorced   Family History: Family History  Family history unknown: Yes   Allergies: No Known Allergies Medications: See med rec.  Review of Systems: No headache, visual changes, nausea, vomiting, diarrhea, constipation, dizziness, abdominal pain, skin rash, fevers, chills, night sweats, swollen lymph nodes, weight loss, chest pain, body aches, joint swelling, muscle aches, shortness of breath, mood changes, visual or auditory hallucinations.  Objective:    BP 113/67   Pulse 100   Ht 5' 8 (1.727 m)   Wt 119 lb 12.8 oz (54.3 kg)   SpO2 94%   BMI 18.22 kg/m   General: Well Developed, well nourished, and in no acute distress.  Neuro: Alert and oriented x3, extra-ocular muscles intact, sensation grossly intact. Cranial nerves II through XII are intact, motor, sensory, and coordinative functions are all intact. HEENT: Normocephalic, atraumatic, pupils equal round reactive to light, neck supple, no masses, no lymphadenopathy, thyroid nonpalpable. Oropharynx, nasopharynx, external ear canals are unremarkable. Skin: Warm and dry, no rashes noted.   Cardiac: Regular rate and rhythm, no murmurs rubs or gallops.  Respiratory: Clear to auscultation bilaterally. Diffuse expiratory wheezing noted. Abdominal: Soft, nontender, nondistended, positive bowel sounds, no masses, no organomegaly. Musculoskeletal: Shoulder, elbow, wrist, hip, knee, ankle stable, and with full range of motion.  Impression and Recommendations:    Wellness examination Assessment & Plan: Routine HCM labs ordered. HCM reviewed/discussed. Anticipatory guidance regarding healthy weight, lifestyle and choices given. Recommend healthy diet.  Recommend approximately 150 minutes/week of moderate intensity exercise Recommend regular dental and vision exams Always use seatbelt/lap and shoulder restraints Recommend using smoke alarms and checking batteries at least twice a year Recommend using sunscreen when outside Discussed colon cancer screening recommendations, options.  Patient is UTD Discussed immunization recommendations  Orders: -     CBC with Differential/Platelet -     Comprehensive metabolic panel with GFR -     Lipid panel  Blurry vision -     Ambulatory referral to Ophthalmology  Tinnitus of both ears -     Ambulatory referral to  Audiology  Knee injury, unspecified laterality, initial encounter -     Ambulatory referral to Orthopedic Surgery  Patient reports intermittent blurry vision of left eye.  We can proceed with referral to ophthalmologist today for further evaluation.  He notes that he has some change in hearing in both ears with ringing in both ears noted as well.  This has been going on for a while and has been gradually progressive.  We can proceed with referral to audiologist for further evaluation.  Patient did have fall with knee injury a few months ago.  He was seen in the emergency department and was referred for outpatient follow-up with orthopedic surgeon, however he reports that he did not hear from orthopedic surgeons office.  Did  review prior records and it appears that he was to be seen by Dr. Ozell Purchase.  We have provided referral today for patient to schedule appointment for further evaluation.  Return in about 4 months (around 04/30/2024).   ___________________________________________ Nichelle Renwick de [place], MD, ABFM, CAQSM Primary Care and Sports Medicine [facility]

## 2024-01-01 NOTE — Patient Instructions (Signed)
  Medication Instructions:  Your physician recommends that you continue on your current medications as directed. Please refer to the Current Medication list given to you today. --If you need a refill on any your medications before your next appointment, please call your pharmacy first. If no refills are authorized on file call the office.-- Lab Work: Your physician has recommended that you have lab work today: You are having labs today If you have labs (blood work) drawn today and your tests are completely normal, you will receive your results via MyChart message OR a phone call from our staff.  Please ensure you check your voicemail in the event that you authorized detailed messages to be left on a delegated number. If you have any lab test that is abnormal or we need to change your treatment, we will call you to review the results.  Referrals/Procedures/Imaging: 3 referrals placed to day   Follow-Up: Your next appointment:   Your physician recommends that you schedule a follow-up appointment in: 4 months with Dr. de Cuba  You will receive a text message or e-mail with a link to a survey about your care and experience with us  today! We would greatly appreciate your feedback!   Thanks for letting us  be apart of your health journey!!  Primary Care and Sports Medicine   Dr. Quintin sheerer Cuba   We encourage you to activate your patient portal called MyChart.  Sign up information is provided on this After Visit Summary.  MyChart is used to connect with patients for Virtual Visits (Telemedicine).  Patients are able to view lab/test results, encounter notes, upcoming appointments, etc.  Non-urgent messages can be sent to your provider as well. To learn more about what you can do with MyChart, please visit --  [email or web address].

## 2024-01-02 LAB — CBC WITH DIFFERENTIAL/PLATELET
Basophils Absolute: 0.1 x10E3/uL (ref 0.0–0.2)
Basos: 1 %
EOS (ABSOLUTE): 0.1 x10E3/uL (ref 0.0–0.4)
Eos: 1 %
Hematocrit: 46.5 % (ref 37.5–51.0)
Hemoglobin: 15.7 g/dL (ref 13.0–17.7)
Immature Grans (Abs): 0 x10E3/uL (ref 0.0–0.1)
Immature Granulocytes: 0 %
Lymphocytes Absolute: 1.5 x10E3/uL (ref 0.7–3.1)
Lymphs: 18 %
MCH: 35.9 pg — ABNORMAL HIGH (ref 26.6–33.0)
MCHC: 33.8 g/dL (ref 31.5–35.7)
MCV: 106 fL — ABNORMAL HIGH (ref 79–97)
Monocytes Absolute: 0.8 x10E3/uL (ref 0.1–0.9)
Monocytes: 10 %
Neutrophils Absolute: 5.9 x10E3/uL (ref 1.4–7.0)
Neutrophils: 70 %
Platelets: 295 x10E3/uL (ref 150–450)
RBC: 4.37 x10E6/uL (ref 4.14–5.80)
RDW: 12.8 % (ref 11.6–15.4)
WBC: 8.3 x10E3/uL (ref 3.4–10.8)

## 2024-01-02 LAB — LIPID PANEL
Chol/HDL Ratio: 3 ratio (ref 0.0–5.0)
Cholesterol, Total: 124 mg/dL (ref 100–199)
HDL: 41 mg/dL (ref >39)
LDL Chol Calc (NIH): 25 mg/dL (ref 0–99)
Triglycerides: 420 mg/dL — ABNORMAL HIGH (ref 0–149)
VLDL Cholesterol Cal: 58 mg/dL — ABNORMAL HIGH (ref 5–40)

## 2024-01-02 LAB — COMPREHENSIVE METABOLIC PANEL WITH GFR
ALT: 33 IU/L (ref 0–44)
AST: 71 IU/L — ABNORMAL HIGH (ref 0–40)
Albumin: 3.8 g/dL — ABNORMAL LOW (ref 3.9–4.9)
Alkaline Phosphatase: 98 IU/L (ref 47–123)
BUN/Creatinine Ratio: 9 — ABNORMAL LOW (ref 10–24)
BUN: 4 mg/dL — ABNORMAL LOW (ref 8–27)
Bilirubin Total: 1.3 mg/dL — ABNORMAL HIGH (ref 0.0–1.2)
CO2: 22 mmol/L (ref 20–29)
Calcium: 9.1 mg/dL (ref 8.6–10.2)
Chloride: 99 mmol/L (ref 96–106)
Creatinine, Ser: 0.45 mg/dL — ABNORMAL LOW (ref 0.76–1.27)
Globulin, Total: 3.5 g/dL (ref 1.5–4.5)
Glucose: 93 mg/dL (ref 70–99)
Potassium: 4.1 mmol/L (ref 3.5–5.2)
Sodium: 139 mmol/L (ref 134–144)
Total Protein: 7.3 g/dL (ref 6.0–8.5)
eGFR: 114 mL/min/1.73 (ref >59)

## 2024-01-04 ENCOUNTER — Encounter (HOSPITAL_BASED_OUTPATIENT_CLINIC_OR_DEPARTMENT_OTHER): Payer: Self-pay

## 2024-01-04 ENCOUNTER — Ambulatory Visit (HOSPITAL_BASED_OUTPATIENT_CLINIC_OR_DEPARTMENT_OTHER): Payer: Self-pay | Admitting: Family Medicine

## 2024-02-05 ENCOUNTER — Encounter: Payer: Self-pay | Admitting: Audiologist

## 2024-02-05 ENCOUNTER — Ambulatory Visit: Attending: Family Medicine | Admitting: Audiologist

## 2024-02-05 DIAGNOSIS — H906 Mixed conductive and sensorineural hearing loss, bilateral: Secondary | ICD-10-CM | POA: Insufficient documentation

## 2024-02-05 DIAGNOSIS — Z9889 Other specified postprocedural states: Secondary | ICD-10-CM | POA: Diagnosis present

## 2024-02-05 NOTE — Procedures (Signed)
°  Outpatient Audiology and [facility] [address] [place], [place]  [address] [phone]  AUDIOLOGICAL  EVALUATION  NAME: Marc Mcdonald     DOB:   14-Apr-1955      MRN: [id number]                                                                                     DATE: 02/05/2024     REFERENT: de Cuba, Raymond J, MD STATUS: Outpatient DIAGNOSIS: Mixed Hearing Loss Bilaterally, History of Tympanoplasty   History: Marc Mcdonald was seen for an audiological evaluation due to difficulty hearing. He knows he needs hearing aids. He had a test at [facility] which he 'failed'. He struggles to hear everyone. He has the TV at 59 and he sits two feet from it. His son would like him to get hearing aids. Ara had surgery to patch his eardrums over twenty years ago.  Marc Mcdonald denies pain or pressure. He has a loud tinnitus in both ears, the ringing changes volume and pitch often. Its in both ears.  Marc Mcdonald has history of hazardous noise exposure.  Medical history shows risk for hearing loss.    Evaluation:  Otoscopy showed a clear view of the tympanic membranes, bilaterally Tympanometry results were consistent with hypercompliance, type Ad, bilaterally. Tymoanoplasty in tact and functioning well.  Audiometric testing was completed using Conventional Audiometry techniques with insert earphones and supraural headphones. Test results are consistent with mils sloping to moderately severe mixed sensorineural  and conductive hearing loss bilaterally. Speech Recognition Thresholds were obtained at  50dB HL in the right ear and at 60dB HL in the left ear. Word Recognition Testing was completed at 5dB below UCL, 80dB and Marc Mcdonald scored 92% in his right ear and 86% in his left ear.    Results:  The test results were reviewed with Marc Mcdonald. He does need hearing aids in both ears. He has mixed sloping hearing loss from mild to moderately severe. He still has good ability to understand speech if its  loud. He needs to use hearing aids sooner rather than later to preserve this speech understanding. His middle ear function is good, no indication of damage to tympanoplasty.  Audiogram printed and provided to Marc Mcdonald.    Recommendations: Hearing aids recommended for both ears. Patient given list of local hearing aid providers. He can call [employer] to see if he has a hearing aid benefit, handout provided.  Annual audiometric testing recommended to monitor hearing loss for progression.  Recommend referral to Otolaryngology if he ever feels there is a sudden change in his hearing due to history of tympanoplasty.      30 minutes spent testing and counseling on results.   If you have any questions please feel free to contact me at [phone].  Lauraine Ka Stalnaker Au.D.  Audiologist   02/05/2024  9:25 AM  Cc: de Cuba, Raymond J, MD

## 2024-02-26 ENCOUNTER — Encounter (HOSPITAL_BASED_OUTPATIENT_CLINIC_OR_DEPARTMENT_OTHER): Payer: Self-pay | Admitting: Family Medicine

## 2024-03-17 ENCOUNTER — Other Ambulatory Visit (HOSPITAL_BASED_OUTPATIENT_CLINIC_OR_DEPARTMENT_OTHER): Payer: Self-pay | Admitting: Family Medicine

## 2024-04-29 ENCOUNTER — Ambulatory Visit (HOSPITAL_BASED_OUTPATIENT_CLINIC_OR_DEPARTMENT_OTHER): Admitting: Family Medicine

## 2024-10-18 ENCOUNTER — Encounter (HOSPITAL_BASED_OUTPATIENT_CLINIC_OR_DEPARTMENT_OTHER)
